# Patient Record
Sex: Female | Born: 1935
Health system: Southern US, Community
[De-identification: ages and names within clinical notes are randomized; demographics above are authoritative.]

## PROBLEM LIST (undated history)

## (undated) DIAGNOSIS — Z974 Presence of external hearing-aid: Secondary | ICD-10-CM

## (undated) DIAGNOSIS — I10 Essential (primary) hypertension: Secondary | ICD-10-CM

## (undated) HISTORY — PX: TONSILLECTOMY AND ADENOIDECTOMY: SUR1326

## (undated) HISTORY — PX: KNEE SURGERY: SHX244

## (undated) HISTORY — PX: PARTIAL HYSTERECTOMY: SHX80

## (undated) HISTORY — PX: LUNG BIOPSY: SHX232

## (undated) HISTORY — DX: Essential (primary) hypertension: I10

---

## 1999-10-21 ENCOUNTER — Other Ambulatory Visit: Admission: RE | Admit: 1999-10-21 | Discharge: 1999-10-21 | Payer: Self-pay | Admitting: Obstetrics and Gynecology

## 1999-10-28 ENCOUNTER — Encounter: Payer: Self-pay | Admitting: Obstetrics and Gynecology

## 1999-10-28 ENCOUNTER — Encounter: Admission: RE | Admit: 1999-10-28 | Discharge: 1999-10-28 | Payer: Self-pay | Admitting: Obstetrics and Gynecology

## 1999-12-13 ENCOUNTER — Encounter: Payer: Self-pay | Admitting: Obstetrics and Gynecology

## 1999-12-13 ENCOUNTER — Encounter: Admission: RE | Admit: 1999-12-13 | Discharge: 1999-12-13 | Payer: Self-pay | Admitting: Internal Medicine

## 2000-04-12 ENCOUNTER — Other Ambulatory Visit: Admission: RE | Admit: 2000-04-12 | Discharge: 2000-04-12 | Payer: Self-pay | Admitting: Obstetrics and Gynecology

## 2000-10-06 ENCOUNTER — Encounter: Payer: Self-pay | Admitting: Emergency Medicine

## 2000-10-06 ENCOUNTER — Emergency Department (HOSPITAL_COMMUNITY): Admission: EM | Admit: 2000-10-06 | Discharge: 2000-10-06 | Payer: Self-pay | Admitting: Emergency Medicine

## 2001-03-22 ENCOUNTER — Other Ambulatory Visit: Admission: RE | Admit: 2001-03-22 | Discharge: 2001-03-22 | Payer: Self-pay | Admitting: Obstetrics and Gynecology

## 2002-07-16 ENCOUNTER — Encounter: Payer: Self-pay | Admitting: Obstetrics and Gynecology

## 2002-07-16 ENCOUNTER — Encounter: Admission: RE | Admit: 2002-07-16 | Discharge: 2002-07-16 | Payer: Self-pay | Admitting: Obstetrics and Gynecology

## 2003-10-07 ENCOUNTER — Encounter: Admission: RE | Admit: 2003-10-07 | Discharge: 2003-10-07 | Payer: Self-pay | Admitting: Family Medicine

## 2003-10-30 ENCOUNTER — Ambulatory Visit (HOSPITAL_COMMUNITY): Admission: RE | Admit: 2003-10-30 | Discharge: 2003-10-30 | Payer: Self-pay | Admitting: Family Medicine

## 2003-11-20 ENCOUNTER — Encounter: Admission: RE | Admit: 2003-11-20 | Discharge: 2003-11-20 | Payer: Self-pay | Admitting: Orthopedic Surgery

## 2003-12-21 ENCOUNTER — Encounter: Admission: RE | Admit: 2003-12-21 | Discharge: 2003-12-21 | Payer: Self-pay | Admitting: Orthopedic Surgery

## 2003-12-23 ENCOUNTER — Ambulatory Visit (HOSPITAL_BASED_OUTPATIENT_CLINIC_OR_DEPARTMENT_OTHER): Admission: RE | Admit: 2003-12-23 | Discharge: 2003-12-23 | Payer: Self-pay | Admitting: Orthopedic Surgery

## 2003-12-23 ENCOUNTER — Ambulatory Visit (HOSPITAL_COMMUNITY): Admission: RE | Admit: 2003-12-23 | Discharge: 2003-12-23 | Payer: Self-pay | Admitting: Orthopedic Surgery

## 2005-07-11 ENCOUNTER — Encounter: Admission: RE | Admit: 2005-07-11 | Discharge: 2005-07-11 | Payer: Self-pay | Admitting: Family Medicine

## 2005-08-18 ENCOUNTER — Encounter: Admission: RE | Admit: 2005-08-18 | Discharge: 2005-08-18 | Payer: Self-pay | Admitting: Family Medicine

## 2006-09-03 ENCOUNTER — Encounter (INDEPENDENT_AMBULATORY_CARE_PROVIDER_SITE_OTHER): Payer: Self-pay | Admitting: Cardiology

## 2006-09-03 ENCOUNTER — Ambulatory Visit: Payer: Self-pay | Admitting: Vascular Surgery

## 2006-09-03 ENCOUNTER — Inpatient Hospital Stay (HOSPITAL_COMMUNITY): Admission: EM | Admit: 2006-09-03 | Discharge: 2006-09-04 | Payer: Self-pay | Admitting: Emergency Medicine

## 2006-09-14 ENCOUNTER — Ambulatory Visit (HOSPITAL_COMMUNITY): Admission: RE | Admit: 2006-09-14 | Discharge: 2006-09-14 | Payer: Self-pay | Admitting: Ophthalmology

## 2006-09-19 ENCOUNTER — Inpatient Hospital Stay (HOSPITAL_COMMUNITY): Admission: EM | Admit: 2006-09-19 | Discharge: 2006-09-21 | Payer: Self-pay | Admitting: Emergency Medicine

## 2007-10-22 ENCOUNTER — Encounter: Admission: RE | Admit: 2007-10-22 | Discharge: 2007-10-22 | Payer: Self-pay | Admitting: Family Medicine

## 2009-04-14 ENCOUNTER — Ambulatory Visit (HOSPITAL_COMMUNITY): Admission: RE | Admit: 2009-04-14 | Discharge: 2009-04-14 | Payer: Self-pay | Admitting: Orthopedic Surgery

## 2009-04-14 ENCOUNTER — Encounter (INDEPENDENT_AMBULATORY_CARE_PROVIDER_SITE_OTHER): Payer: Self-pay | Admitting: Orthopedic Surgery

## 2009-04-14 ENCOUNTER — Ambulatory Visit: Payer: Self-pay | Admitting: Vascular Surgery

## 2010-09-20 NOTE — Consult Note (Signed)
NAME:  Danielle Thomas, Danielle Thomas NO.:  [id number]   MEDICAL RECORD NO.:  [id number]          PATIENT TYPE:  INP   LOCATION:  4715                         FACILITY:  [facility]   PHYSICIAN:  Lyn Records, M.D.   DATE OF BIRTH:  Dec 16, 1927   DATE OF CONSULTATION:  09/20/2006  DATE OF DISCHARGE:                                 CONSULTATION   REASON FOR CONSULTATION:  Paroxysmal atrial fibrillation, recent stroke.   CONCLUSIONS:  1. Tachybrady syndrome, asymptomatic.      a.     Paroxysmal atrial fibrillation.      b.     Post-conversion bradycardia.      c.     Sinus bradycardia at rest.      d.     Sinus bradycardia with first-degree AV block on 12-lead EKG.  2. 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  3. Hypertension.   RECOMMENDATIONS:  1. There is no indication for permanent pacemaker therapy at this      time.  2. Because the tachybrady syndrome is asymptomatic and during atrial      fibrillation the rate is controlled, we do not need to pursue      rhythm control.  She will however need to have continuous      ambulatory monitoring for 7 days after discharge to determine if      when atrial fibrillation is present rate is excessively fast.  3. Coumadin should be taken indefinitely.  4. Blood pressure should be controlled with medications and also heart      rate.  5. We should overlap with Lovenox until INR is greater than 2.   COMMENTS:  The patient is a very pleasant 77 year old who was recently  discharged from the hospital after a transient neurological deficit that  was associated with broken aphasia.  Yesterday she noted elevation in  her blood pressure although she felt well.  She went into Dr. Aurelio Brash  office and it was noted that her heart rate was irregular.  EKG done at  that time demonstrated atrial fibrillation, new in onset and never  previously identified.   She was admitted to the hospital and had spontaneous conversion to  normal sinus rhythm associated with a 2-second pause.  Since that time  she has had sinus bradycardia to low normal sinus rhythm in the 50-70  range.  She is totally asymptomatic.  She has not had any new  neurological complaints.   Had a previous Cardiolite study done in 2003 that was normal.  She has  also had a recent echocardiogram done during her hospitalization for  transient ischemic attack that revealed an LVEF of 70% with no regional  wall motion abnormalities and mild mitral regurgitation.   The patient's medications on admission included lisinopril 10 mg per day  and aspirin 325 mg per day.   FAMILY HISTORY:  Noncontributory.   SIGNIFICANT MEDICAL PROBLEMS:  1. Hypertension.  2. Transient neurological deficit as mentioned above.  3. Glaucoma recently operated upon.  4. Renal insufficiency.  5. History of hypokalemia.  ALLERGIES:  CODEINE AND DEMEROL.   EXAMINATION:  GENERAL:  The patient was lying comfortably.  VITAL SIGNS:  Her blood pressure is 120/68, heart rate 48, respirations  18, O2 saturation 96% on room air.  NECK:  Exam reveals faint bilateral carotid bruits.  LUNGS:  Clear.  CARDIAC EXAM:  No murmur, no rub.  ABDOMEN:  Soft.  EXTREMITIES:  No edema.  NEUROLOGIC:  Exam reveals no focal deficit.   STUDIES:  EKG reveals sinus bradycardia with first-degree AV block at  2:20 a.m. on Sep 20, 2006. PR intervals 244 msec, there is a left atrial  abnormality, heart rate is 52.  Prior EKG on Sep 19, 2006, from Dr.  Aurelio Brash office revealed atrial fibrillation with a rate of 86 beats  per minute.  EKG repeated on Sep 19, 2006, at 2:08 p.m. heart rate was  90 with the patient in atrial fibrillation.   Laboratory data includes creatinine of 1.1, hemoglobin 14.4, white blood  cell count 6800,  CK-MB 5.3, BNP 586, TSH 1.79.  Chest x-ray has not  been repeated.  Blood pressures have been under good control since  admission.   DISCUSSION:  The patient has  tachybrady syndrome with paroxysmal atrial  fibrillation, postconversion bradycardia and controlled ventricular  response in atrial fibrillation.  She is asymptomatic with reference to  the tachybrady syndrome.  Because of rate control during atrial  fibrillation, rhythm control is not mandatory.  Rhythm control will  probably precipitate permanent pacemaker insertion.  I believe at this  point the only mandatory therapy is good blood pressure control with non-  heart-rate-slowing medications and chronic Coumadin therapy to prevent  recurrent embolic events.  Her CHADS2 score is 4 suggesting an embolic  yearly risk of greater than 8% per year.  She should have continuous  ambulatory monitoring for at least a week as an outpatient and if we  demonstrate excessive bradycardia and certainly if there any symptoms to  suggest possible transient cerebral hypoperfusion permanent pacemaker  may become necessary.      Lyn Records, M.D.  Electronically Signed     HWS/MEDQ  D:  09/20/2006  T:  09/20/2006  Job:  [id number]   cc:   Vikki Ports, M.D.

## 2010-09-20 NOTE — Discharge Summary (Signed)
NAME:  Danielle Thomas, Danielle Thomas                ACCOUNT NO.:  [id number]   MEDICAL RECORD NO.:  [id number]          PATIENT TYPE:  INP   LOCATION:  4715                         FACILITY:  [facility]   PHYSICIAN:  Corinna L. Lendell Caprice, MDDATE OF BIRTH:  12-03-1927   DATE OF ADMISSION:  09/19/2006  DATE OF DISCHARGE:  09/21/2006                               DISCHARGE SUMMARY   DISCHARGE DIAGNOSES:  1. Atrial fibrillation with tachybrady syndrome, new diagnosis,      paroxysmal.  2. Uncontrolled hypertension.  3. History of recent transient ischemic attack.   DISCHARGE MEDICATIONS:  1. Coumadin 5 mg nightly.  2. Lisinopril 10 mg daily.  3. She is to stop aspirin.   CONDITION:  Stable.   CONSULTATION:  Dr. Verdis Prime.   PROCEDURES:  None.   DIET:  Low-salt, Coumadin friendly.   ACTIVITY:  Ad lib.   FOLLOW UP:  1. Dr. Theresia Lo as needed.  2. Dr. Verdis Prime as scheduled by him in a week.  3. [facility] on Monday.   PERTINENT LABORATORY DATA:  CBC within normal limits.  PTT on admission  33, INR on admission is 1, at discharge is 1.7.  Basic metabolic panel  unremarkable.  Cardiac enzymes unremarkable.  BNP 586.  TSH 1.795.   SPECIAL STUDIES AND RADIOLOGY:  EKG showed atrial fibrillation with a  rate of 90.   HISTORY AND HOSPITAL COURSE:  Ms. Buccieri is a 77 year old white female  patient of Dr. Theresia Lo who was noted to have a blood pressure above 200  and irregular heart rhythm in the office.  An EKG was done in the office  which showed atrial fibrillation and she was sent to the emergency room.  Her blood pressure in the emergency room was 177/103 and she remained in  atrial fibrillation with a rate of about 100.  She was asymptomatic with  respect to the arrhythmia.  She had suffered a recent TIA and was not  noted to be in atrial fibrillation at that time.  She was placed on  telemetry, started on Lovenox and Coumadin.  At discharge her INR is  nearly therapeutic.  Her  lisinopril was continued.  She had a dose of IV  Cardizem in the emergency room as her blood pressure was high and her  rate was slightly high.  She also got a single dose of Cardizem CD, but  subsequently converted to normal sinus rhythm with periods of sinus  bradycardia and pauses. The Cardizem was stopped and cardiology was  consulted.  They recommended lifelong Coumadin.  As she was asymptomatic  with respect to her bradycardia, they did not feel that a pacemaker was  warranted at this time.  They also recommended no antiarrhythmics at  this time.  She will be set up for Holter monitor as an outpatient.  She  was given teaching about a Coumadin friendly diet and spoke with the  dietician prior to discharge.  Other than irregularly irregular heart  rhythm, she had an unremarkable exam on admission.  At discharge her  blood pressure was within normal limits on  the lisinopril alone.  She  had some periods of sinus bradycardia when she was asleep but was  ambulating fine and otherwise asymptomatic.      Corinna L. Lendell Caprice, MD  Electronically Signed     CLS/MEDQ  D:  09/21/2006  T:  09/21/2006  Job:  [id number]   cc:   Lyn Records, M.D.  Vikki Ports, M.D.

## 2010-09-20 NOTE — H&P (Signed)
NAME:  Danielle Thomas, Danielle Thomas NO.:  [id number]   MEDICAL RECORD NO.:  [id number]          PATIENT TYPE:  EMS   LOCATION:  MAJO                         FACILITY:  [facility]   PHYSICIAN:  Hollice Espy, M.D.DATE OF BIRTH:  [birth year]   DATE OF ADMISSION:  09/02/2006  DATE OF DISCHARGE:                              HISTORY & PHYSICAL   PCP:  Dr. Lanell Persons.   CHIEF COMPLAINT:  Slurred speech.   HISTORY OF PRESENT ILLNESS:  The patient is a 77 year old white female  with past medical history of hypertension who presents to the emergency  room with complaints of slurred speech.  She previously has been in good  health and then this evening while having dessert suddenly noticed that  she was drooling.  This would not stop and she looked in the mirror and  she found one-half of her face to be drooping.  She called her daughter  immediately and her daughter noted that the patient had slurred speech  on the phone.  Paramedics were called and by the time the paramedics  arrived, all of her symptoms had completely resolved.  She said the  entire spell lasted for less than 30 minutes.  She was brought into the  emergency room.  There, she was noted to have a blood pressure of 167/93  and a heart rate of 57 and normal sinus.  Labs were ordered on the  patient, all of which were essentially unremarkable, except for a  creatinine of 1.2.  A CT scan of the head was unremarkable as well.  It  was likely felt that the patient had suffered a TIA and it was thought  she best come in for further evaluation.  Currently the patient is doing  well.   REVIEW OF SYSTEMS:  She denies any headaches, vision changes, dysphagia,  chest pain, palpitations, shortness of breath, wheezing, coughing,  abdominal pain, hematuria, dysuria, constipation, diarrhea, focal  extremity numbness, weakness, or pain, nausea, vomiting, or previous  complaints.  Her review of systems is otherwise negative.   PAST MEDICAL HISTORY:  Includes hypertension.   MEDICATIONS:  She is on:  1. Atenolol and hydrochlorothiazide 100/25 mg 1/2 tab p.o. daily.  2. Lisinopril 10 mg p.o. daily.   ALLERGIES:  She has no known drug allergies.   SOCIAL HISTORY:  She denies any tobacco, alcohol, or drug use.   FAMILY HISTORY:  Noncontributory.   PHYSICAL EXAMINATION:  VITAL SIGNS:  On admission, temp 97.4, heart rate  57, blood pressure 167/93, respirations 13.  O2 sat 97% on room air.  GENERAL:  The patient is alert, oriented x3, in no apparent distress.  HEENT:  Normocephalic, atraumatic.  NECK:  She has no carotid bruits.  HEART:  Regular rhythm, mild bradycardia.  LUNGS:  Clear to auscultation bilaterally.  ABDOMEN:  Soft, nontender, nondistended.  Positive bowel sounds.  EXTREMITIES:  Show no clubbing, cyanosis, or edema.  NEUROLOGIC:  Her cranial nerves, II through XII, are intact.  Completely  normal.  There are no focal deficits.  She has no cerebellar  dysfunction.  She  has good coordination.  Her strength is equal and  symmetric on both sides, about a 5/5, in both flexion and extension and  grip for upper and lower extremities.  Full sensation is intact.  She  has no sensory dysfunction.  No evidence of Babinski sign.   LAB WORK:  CT scan of the head shows no acute intracranial  abnormalities.  Atrophy and chronic small-vessel disease.  White count  6.6, H&H 13.9 and 40.6, MCV of 87, platelets 200, no shift.  Sodium 141,  potassium 3.2, chloride 106, bicarb 26, BUN 22, creatinine 1.2, glucose  of 123.  LFTs are unremarkable.  Coags are unremarkable.   ASSESSMENT AND PLAN:  1. Transient ischemic attack.  We will plan to place the patient on      telemetry, monitor her blood pressure.  Check an MRI/MRA to rule      out intracranial stenosis as well as extracranial stenosis.      Carotid artery Dopplers.  I will also check a 2-D echo, fasting      lipid profile, and homocysteine level.  2.  Hypertension.  Her systolic blood pressure is not very well      controlled.  We will continue on her lisinopril, atenolol.  She      will likely need an increase in her ACE inhibitor or to add an      additional agent such as perhaps nitrate.  3. Bradycardia, mild, secondary to her continued use of atenolol.  4. Renal insufficiency secondary to hypertension.  5. Hypokalemia.  We will replace.      Hollice Espy, M.D.  Electronically Signed     SKK/MEDQ  D:  09/02/2006  T:  09/03/2006  Job:  [id number]   cc:   Vikki Ports, M.D.

## 2010-09-20 NOTE — H&P (Signed)
NAME:  Danielle Thomas, Danielle Thomas                ACCOUNT NO.:  [id number]   MEDICAL RECORD NO.:  [id number]          PATIENT TYPE:  INP   LOCATION:  4715                         FACILITY:  [facility]   PHYSICIAN:  Danielle Thomas, MDDATE OF BIRTH:  08-17-1927   DATE OF ADMISSION:  09/19/2006  DATE OF DISCHARGE:                              HISTORY & PHYSICAL   CHIEF COMPLAINT:  High blood pressure and irregular heart beat.   HISTORY OF PRESENT ILLNESS:  Danielle Thomas is a pleasant 77 year old white  female patient of Danielle Thomas who was sent to the emergency room for  evaluation of new onset atrial fibrillation as well as uncontrolled  hypertension.  The patient took her blood pressure this morning.  Her  systolic blood pressure was above 200 and she therefore sought treatment  at [facility].  He did an EKG and noted that she was in  atrial fibrillation and she was therefore sent to the emergency room.  She has had no chest pain.  She has had no shortness of breath.  She has  had no history of heart disease.  She does have a history of  hypertension.  She had an echocardiogram last month at which time she  was admitted for a transient ischemic attack.  Her ejection fraction was  normal on echocardiogram, and she had no significant valvular disease or  source of embolus.  She did, however, have biatrial dilatation, which  was described as moderate.  The patient is asymptomatic with respect to  her atrial fibrillation.  However, she does say that sometimes at  nighttime when she is getting ready for bed she can feel her heart  racing.  Currently, she feels no palpitations.  The patient reports  having had a stress test with [facility]le Thomas about 5 years ago, which she  thinks was unremarkable.  Her beta blocker, hydrochlorothiazide  combination pill was recently stopped due to some diarrhea and dizziness  and possibly issues with low blood pressure.  The patient is not having  any diarrhea  currently.  She is still on her lisinopril.  There is no  discharge summary from her hospitalization last month.   PAST MEDICAL HISTORY:  1. Reported TIA last month, although discharge summary is not      available.  2. Hypertension.  3. Recent laser surgery for increased pressure in her eyes, although      she denies glaucoma.  4. She apparently had some mild bradycardia, renal insufficiency, and      hypokalemia during her last admission.  Her heart rate apparently      at that time was 57.   MEDICATIONS:  1. Lisinopril 10 mg a day.  2. Aspirin 325 mg a day.   SOCIAL HISTORY:  Reviewed and as per previous H&P.   FAMILY HISTORY:  Noncontributory.   REVIEW OF SYSTEMS:  As above, otherwise negative.   PHYSICAL EXAMINATION:  VITAL SIGNS:  Her blood pressure in the emergency  room was 177/103, temperature 97.3, pulse ranged from 95-103,  respiratory rate was 24, oxygen saturation 99% on room  air.  GENERAL:  The patient is a somewhat anxious-appearing white female who  is cooperative.  HEENT:  Normocephalic, atraumatic.  Pupils equal, round, reactive to  light.  Sclerae nonicteric.  Moist mucous membranes.  NECK:  Supple.  No carotid bruits.  No JVD.  LUNGS:  Clear to auscultation bilaterally without wheezes, rhonchi, or  rales.  CARDIOVASCULAR:  Irregularly irregular without murmurs, gallops, or  rubs.  ABDOMEN:  Normal bowel sounds, soft, nontender, nondistended.  GU AND RECTAL:  Deferred.  EXTREMITIES:  No clubbing, cyanosis or edema.  SKIN:  No rash.  PSYCHIATRIC:  As above.   LABS:  Coagulation panel normal.  Basic metabolic panel is essentially  unremarkable.  Cardiac enzymes unremarkable.  CBC was not done.  EKG  shows atrial fibrillation with rate of 94.   ASSESSMENT AND PLAN:  1. Atrial fibrillation, new onset:  The patient is relatively      asymptomatic and therefore the duration of the atrial fibrillation      is unknown.  I will therefore start anticoagulation  and let Dr.      Katrinka Thomas know the patient has been admitted.  He may just decide to      follow up the patient in his Thomas in 30 days on Coumadin as long      as she remains otherwise stable.  Certainly, this could have caused      her transient ischemic attack.  If she has had paroxysmal atrial      fibrillation that has previously been undocumented.  Her rate is      slightly high and I will therefore give Cardizem.  She got a dose      of intravenous Cardizem in the emergency room.  2. Uncontrolled hypertension most likely secondary to stopping her      beta blocker and hydrochlorothiazide.  For now, I will just give      Cardizem but continue the lisinopril.  3. Recent transient ischemic attack.  I will stop the aspirin while      she is on Coumadin.  4. Recent laser surgery in both eyes.      Danielle L. Lendell Caprice, MD  Electronically Signed     CLS/MEDQ  D:  09/19/2006  T:  09/20/2006  Job:  [id number]   cc:   Danielle Thomas, M.D.  Danielle Thomas, M.D.

## 2010-09-23 NOTE — Op Note (Signed)
NAME:  Danielle Thomas, Danielle Thomas                          ACCOUNT NO.:  [id number]   MEDICAL RECORD NO.:  [id number]                   PATIENT TYPE:  AMB   LOCATION:  DSC                                  FACILITY:  [facility]   PHYSICIAN:  Harvie Junior, M.D.                DATE OF BIRTH:  29-Jan-1928   DATE OF PROCEDURE:  12/23/2003  DATE OF DISCHARGE:                                 OPERATIVE REPORT   REFERRING PHYSICIAN:  Vikki Ports, M.D.   PREOPERATIVE DIAGNOSIS:  Medial meniscal tear with osteochondral defect  medial femoral condyle.   POSTOPERATIVE DIAGNOSES:  1. Medial meniscal tear with osteochondral defect medial femoral condyle.  2. Chondromalacia patella.   PROCEDURES:  1. Debridement of posterior horn medial meniscal tear with corresponding     debridement of medial femoral condyle and medial tibial plateau.  2. Debridement of chondromalacia patella.   SURGEON:  Harvie Junior, M.D.   ASSISTANT:  Marshia Ly, P.A.   ANESTHESIA:  General anesthesia.   INDICATIONS FOR PROCEDURE:  She is a 77 year old female with a long history  of having medial side joint pain.  We ultimately evaluated her in the office  and treated with anti-inflammatory medication, activity modification and  injection therapy and one of these worked.  She continued to complain of  pain.  MRI was obtained which showed she had a medial meniscal tear as well  as a chondral defect on the medial femoral condyle.  We ultimately talked  about treatment options and felt that arthroscopy will be appropriate course  of action and brought to the operating room for this procedure.   DESCRIPTION OF PROCEDURE:  The patient brought to the operating room and  after adequate anesthesia was obtained with general anesthetic with a knee  block.  The patient was placed supine on the operating table.  the right leg  weakness prepped and draped in sterile fashion.  Following this __________  examination of the knee  revealed that there was an obvious medial meniscal  tear in the posterior horn.  This was debrided back to a smooth and stable  rim.  Attention was then turned to the medial femoral condyle which had some  fairly significant chondral defects with flaking of the articular cartilage.  There was one small area of full thickness defect.  All of this was debrided  back to a smooth and stable rim.  Attention was turned to the lateral  compartment where there was noted to be no significant meniscal pathology or  chondral injury.  ACL was normal.  Patellofemoral joint showed some grade  III changes.  This was debrided on the undersurface of the patella.  She had  a large medial shell plica which was debrided going down to the anterior  horn of the medial meniscus.  The medial meniscus was a little bit mobile on  the right but felt to  overall be in reasonable shape.  This was not  addressed surgically.  At this point, final check was made of the medial  side to make sure that the meniscus posteriorly was held in good position  and following that, attention was turned to the medial femoral condyle to  make sure that this had been debrided adequately.  At this point, the knee  was copiously irrigated and suctioned dry.  The arthroscopic portal was  closed with a bandage.  Sterile compressive dressing was applied.  The  patient was taken to the recovery room where she was noted to be in  satisfactory condition.   ESTIMATED BLOOD LOSS:  None.                                               Harvie Junior, M.D.    Ranae Plumber  D:  12/23/2003  T:  12/23/2003  Job:  [id number]   cc:   Vikki Ports, M.D.  [address] [place] [place]  [place]  [place] [address]  Fax: [phone]

## 2013-02-10 ENCOUNTER — Other Ambulatory Visit: Payer: Self-pay | Admitting: Pharmacist

## 2013-02-10 MED ORDER — WARFARIN SODIUM 3 MG PO TABS: ORAL_TABLET | ORAL | Status: DC

## 2013-02-13 ENCOUNTER — Ambulatory Visit (INDEPENDENT_AMBULATORY_CARE_PROVIDER_SITE_OTHER): Payer: Medicare Other | Admitting: Pharmacist

## 2013-02-13 DIAGNOSIS — I4891 Unspecified atrial fibrillation: Secondary | ICD-10-CM

## 2013-02-13 LAB — POCT INR: INR: 2.9

## 2013-03-27 ENCOUNTER — Ambulatory Visit (INDEPENDENT_AMBULATORY_CARE_PROVIDER_SITE_OTHER): Payer: Medicare Other | Admitting: Pharmacist

## 2013-03-27 DIAGNOSIS — I4891 Unspecified atrial fibrillation: Secondary | ICD-10-CM

## 2013-03-27 LAB — POCT INR: INR: 3

## 2013-04-08 ENCOUNTER — Encounter: Payer: Self-pay | Admitting: Interventional Cardiology

## 2013-04-08 ENCOUNTER — Ambulatory Visit (INDEPENDENT_AMBULATORY_CARE_PROVIDER_SITE_OTHER): Payer: Medicare Other | Admitting: Interventional Cardiology

## 2013-04-08 VITALS — BP 144/82 | HR 58 | Ht 60.0 in | Wt 145.0 lb

## 2013-04-08 DIAGNOSIS — I10 Essential (primary) hypertension: Secondary | ICD-10-CM | POA: Insufficient documentation

## 2013-04-08 DIAGNOSIS — E785 Hyperlipidemia, unspecified: Secondary | ICD-10-CM | POA: Insufficient documentation

## 2013-04-08 MED ORDER — HYDROCHLOROTHIAZIDE 25 MG PO TABS: 12.5000 mg | ORAL_TABLET | Freq: Every day | ORAL | Status: DC

## 2013-04-08 MED ORDER — LOSARTAN POTASSIUM 50 MG PO TABS: 50.0000 mg | ORAL_TABLET | Freq: Every day | ORAL | Status: DC

## 2013-04-08 MED ORDER — AMLODIPINE BESYLATE 5 MG PO TABS: 5.0000 mg | ORAL_TABLET | Freq: Every day | ORAL | Status: DC

## 2013-04-08 NOTE — Patient Instructions (Signed)
Your physician recommends that you continue on your current medications as directed. Please refer to the Current Medication list given to you today.  Your physician wants you to follow-up in: 1 year. You will receive a reminder letter in the mail two months in advance. If you don't receive a letter, please call our office to schedule the follow-up appointment.  

## 2013-04-08 NOTE — Progress Notes (Signed)
Patient ID: Danielle Thomas, female   DOB: Oct 27, 1927, 79 y.o.   MRN: [id number] Past Medical History  Paroxysmal atrial fibrillation   Hypertension   Hyperlipidemia      [address] [place], [place]  [address] Phone: [phone] Fax:  [phone]  Date:  04/08/2013   ID:  Danielle Thomas, Danielle Thomas 1928-01-09, MRN [id number]  PCP:  No primary provider on file.   ASSESSMENT:  1. Paroxysmal atrial fibrillation, asymptomatic 2. Hypertension 3. Hyperlipidemia  PLAN:  1. No change in current medical regimen 2. Clinical followup in one year.   SUBJECTIVE: Danielle Thomas is a 79 y.o. female who is very frustrated today as she was overlooked in the waiting room where she remained for greater than an hour. She is also frustrated by the difficulty with finding parking. She has no cardiac complaints.   Wt Readings from Last 3 Encounters:  04/08/13 145 lb (65.772 kg)     History reviewed. No pertinent past medical history.  Current Outpatient Prescriptions  Medication Sig Dispense Refill  . amLODipine (NORVASC) 5 MG tablet Take 1 tablet (5 mg total) by mouth daily.  90 tablet  3  . dorzolamide (TRUSOPT) 2 % ophthalmic solution 1 drop 3 (three) times daily.      . hydrochlorothiazide (HYDRODIURIL) 25 MG tablet Take 0.5 tablets (12.5 mg total) by mouth daily.  45 tablet  3  . losartan (COZAAR) 50 MG tablet Take 1 tablet (50 mg total) by mouth daily.  90 tablet  3  . omeprazole (PRILOSEC) 20 MG capsule Take 20 mg by mouth daily.      Marland Kitchen oxybutynin (DITROPAN) 5 MG tablet Take 5 mg by mouth 3 (three) times daily.      . travoprost, benzalkonium, (TRAVATAN) 0.004 % ophthalmic solution 1 drop at bedtime.      Marland Kitchen warfarin (COUMADIN) 3 MG tablet Take as directed by coumadin clinic  70 tablet  1   No current facility-administered medications for this visit.    Allergies:   Allergies not on file  Social History:  The patient  reports that she has never smoked. She does not have  any smokeless tobacco history on file.   ROS:  Please see the history of present illness.   Unremarkable with no bleeding or transient neurological symptoms.   All other systems reviewed and negative.   OBJECTIVE: VS:  BP 144/82  Pulse 58  Ht 5' (1.524 m)  Wt 145 lb (65.772 kg)  BMI 28.32 kg/m2 Well nourished, well developed, in no acute distress, healthy HEENT: normal Neck: JVD flat. Carotid bruit absent  Cardiac:  normal S1, S2; RRR; no murmur Lungs:  clear to auscultation bilaterally, no wheezing, rhonchi or rales Abd: soft, nontender, no hepatomegaly Ext: Edema absent. Pulses 2+ Skin: warm and dry Neuro:  CNs 2-12 intact, no focal abnormalities noted  EKG:  Not performed       Signed, Darci Needle III, MD 04/08/2013 2:10 PM

## 2013-04-16 ENCOUNTER — Telehealth: Payer: Self-pay | Admitting: Interventional Cardiology

## 2013-04-16 MED ORDER — LISINOPRIL 20 MG PO TABS: 30.0000 mg | ORAL_TABLET | Freq: Every day | ORAL | Status: DC

## 2013-04-16 NOTE — Telephone Encounter (Signed)
returned pt call pt is not to take losartan.lisinopril 20mg  1.5 tablet has been called into pt mail order and verified doasge. pt instructed to disgard losartan. verified pt was not billed for losartan. and lisinopril will be sent to pt.pt informed and verbalized understanding

## 2013-04-16 NOTE — Telephone Encounter (Signed)
New problem    Receive new medication - lostarn 50 mg  in the mail .patient is question why she receive this ?

## 2013-05-09 ENCOUNTER — Ambulatory Visit (INDEPENDENT_AMBULATORY_CARE_PROVIDER_SITE_OTHER): Payer: Medicare HMO | Admitting: Pharmacist

## 2013-05-09 DIAGNOSIS — I4891 Unspecified atrial fibrillation: Secondary | ICD-10-CM

## 2013-05-09 LAB — POCT INR: INR: 2.7

## 2013-07-02 ENCOUNTER — Other Ambulatory Visit: Payer: Self-pay

## 2013-07-02 MED ORDER — HYDROCHLOROTHIAZIDE 25 MG PO TABS: 12.5000 mg | ORAL_TABLET | Freq: Every day | ORAL | Status: DC

## 2013-09-15 ENCOUNTER — Telehealth: Payer: Self-pay | Admitting: *Deleted

## 2013-09-15 ENCOUNTER — Other Ambulatory Visit: Payer: Self-pay | Admitting: *Deleted

## 2013-09-15 MED ORDER — LISINOPRIL 20 MG PO TABS: 30.0000 mg | ORAL_TABLET | Freq: Every day | ORAL | Status: DC

## 2013-09-15 MED ORDER — AMLODIPINE BESYLATE 5 MG PO TABS: 5.0000 mg | ORAL_TABLET | Freq: Every day | ORAL | Status: DC

## 2013-09-15 NOTE — Telephone Encounter (Signed)
Informed patient Danielle Thomas will be Managing her coumadin since they checked  INR 5/4 and dosed her coumadin, they are sending a warfarin refill to [facility].

## 2013-09-15 NOTE — Telephone Encounter (Signed)
Patient requests warfarin refill be sent to [facility]. Thanks, MI 

## 2013-09-15 NOTE — Telephone Encounter (Signed)
Per [facility]/Dr McNeils office, May 4th patients INR checked 2.1, patient was instructed to continue same dose of coumadin 3 mg, 1/2 tablet daily except on Tues take 1 tablet, informed them patient has requested a refill on her warfarin to [facility].

## 2014-01-29 ENCOUNTER — Other Ambulatory Visit: Payer: Self-pay | Admitting: *Deleted

## 2014-01-29 MED ORDER — AMLODIPINE BESYLATE 5 MG PO TABS: 5.0000 mg | ORAL_TABLET | Freq: Every day | ORAL | Status: DC

## 2014-01-29 MED ORDER — LISINOPRIL 20 MG PO TABS: 30.0000 mg | ORAL_TABLET | Freq: Every day | ORAL | Status: DC

## 2014-02-13 ENCOUNTER — Ambulatory Visit: Payer: Self-pay | Admitting: Interventional Cardiology

## 2014-02-13 DIAGNOSIS — I4891 Unspecified atrial fibrillation: Secondary | ICD-10-CM

## 2014-04-09 DIAGNOSIS — Z7901 Long term (current) use of anticoagulants: Secondary | ICD-10-CM | POA: Insufficient documentation

## 2014-04-10 ENCOUNTER — Ambulatory Visit (INDEPENDENT_AMBULATORY_CARE_PROVIDER_SITE_OTHER): Payer: Medicare HMO | Admitting: Interventional Cardiology

## 2014-04-10 ENCOUNTER — Encounter: Payer: Self-pay | Admitting: Interventional Cardiology

## 2014-04-10 VITALS — BP 114/60 | HR 82 | Ht 60.0 in | Wt 168.0 lb

## 2014-04-10 DIAGNOSIS — E785 Hyperlipidemia, unspecified: Secondary | ICD-10-CM

## 2014-04-10 DIAGNOSIS — E860 Dehydration: Secondary | ICD-10-CM

## 2014-04-10 DIAGNOSIS — Z7901 Long term (current) use of anticoagulants: Secondary | ICD-10-CM

## 2014-04-10 DIAGNOSIS — I10 Essential (primary) hypertension: Secondary | ICD-10-CM

## 2014-04-10 DIAGNOSIS — I480 Paroxysmal atrial fibrillation: Secondary | ICD-10-CM

## 2014-04-10 NOTE — Progress Notes (Signed)
Patient ID: Danielle Thomas, female   DOB: 05/23/1927, 80 y.o.   MRN: [id number]    [address], [place]  [address] Phone: [phone] Fax:  [phone]  Date:  04/10/2014   ID:  Blue, Ruggerio Apr 01, 1928, MRN [id number]  PCP:  No primary care provider on file.   ASSESSMENT:  1. Atrial fibrillation with controlled ventricular response 2. Systemic arterial hypertension, essential and controlled. There is orthostasis today with 132 mmHg systolic pressure sitting and dropping to 90 mmHg was standing. 3. New problem: Nausea, diarrhea, and orthostatic hypotension, confirming the clinical suspicion of dehydration 4. Chronic anticoagulation therapy without complications  PLAN:  1. Discontinue temporarily lisinopril and hydrochlorothiazide 2. Follow-up with primary care concerning laboratory data obtained on the last office visit that included an amylase, lipase, and comprehensive metabolic panel. She seems to be volume contracted at this time 3. No changes in other cardiovascular therapy. 4. INR needs to be followed closely as the patient's intake has significantly been altered and I would anticipate that the INR will have a tendency to be high. It was greater than 4 yesterday and her warfarin dose was cut in half.    SUBJECTIVE: Danielle Thomas is a 80 y.o. female who is here today for follow-up of chronic atrial fibrillation. She has no complaints with reference to that problem. She has a 1 week history of abdominal bloating, nausea, diarrhea, and over the past 24 hours orthostatic dizziness. She saw the 21 assistant at [facility] at for college and was told she had a urinary tract infection. Nitrofurantoin and Compazine were prescribed. Blood work was performed. INR was greater than 4. Coumadin dose was adjusted downward. No other changes were made.  She denies orthopnea, PND, lower extreme swelling, and other cardiac complaints.   Wt Readings from Last 3  Encounters:  04/10/14 168 lb (76.204 kg)  04/08/13 145 lb (65.772 kg)     No past medical history on file.  Current Outpatient Prescriptions  Medication Sig Dispense Refill  . amLODipine (NORVASC) 5 MG tablet Take 1 tablet (5 mg total) by mouth daily. 90 tablet 0  . dorzolamide (TRUSOPT) 2 % ophthalmic solution 1 drop 3 (three) times daily.    . hydrochlorothiazide (HYDRODIURIL) 25 MG tablet Take 0.5 tablets (12.5 mg total) by mouth daily. 45 tablet 3  . lisinopril (PRINIVIL,ZESTRIL) 20 MG tablet Take 1.5 tablets (30 mg total) by mouth daily. 135 tablet 0  . nitrofurantoin, macrocrystal-monohydrate, (MACROBID) 100 MG capsule Take 100 mg by mouth 2 (two) times daily.     Marland Kitchen omeprazole (PRILOSEC) 20 MG capsule Take 20 mg by mouth daily.    Marland Kitchen oxybutynin (DITROPAN) 5 MG tablet Take 5 mg by mouth 3 (three) times daily.    . promethazine (PHENERGAN) 12.5 MG tablet Take 12.5 mg by mouth every 6 (six) hours as needed for nausea or vomiting.    . travoprost, benzalkonium, (TRAVATAN) 0.004 % ophthalmic solution 1 drop at bedtime.    Marland Kitchen warfarin (COUMADIN) 3 MG tablet Take as directed by coumadin clinic 70 tablet 1   No current facility-administered medications for this visit.    Allergies:    Allergies  Allergen Reactions  . Codeine Nausea And Vomiting  . Oxycodone Nausea And Vomiting    Social History:  The patient  reports that she has never smoked. She does not have any smokeless tobacco history on file.   ROS:  Please see the history of present illness.  She has not had blood in her stool. Last diarrhea was Monday. She complains of abdominal bloating. She has anorexia. Has no real abdominal discomfort. She denies chills and fever. She denies dysuria.   All other systems reviewed and negative.   OBJECTIVE: VS:  BP 114/60 mmHg  Pulse 82  Ht 5' (1.524 m)  Wt 168 lb (76.204 kg)  BMI 32.81 kg/m2 Well nourished, well developed, in no acute distress, elderly and chronically  ill-appearing HEENT: normal Neck: JVD flat. Carotid bruit absent  Cardiac:  normal S1, S2; IIRR; no murmur Lungs:  clear to auscultation bilaterally, no wheezing, rhonchi or rales Abd: soft, nontender, no hepatomegaly Ext: Edema absent. Pulses 2+ Skin: warm and dry Neuro:  CNs 2-12 intact, no focal abnormalities noted  EKG:  Atrial fibrillation with controlled ventricular response at 82 bpm       Signed, Illene Labrador III, MD 04/10/2014 10:19 AM

## 2014-04-10 NOTE — Patient Instructions (Signed)
Your physician has recommended you make the following change in your medication:  1) HOLD Lisinpril and HCTZ until your are able to keep fluids down and diarrhea has resolved  Call your primary care physician ( Dr.McNeil) TODAY to follow up on labs that were drawn and their recommendations  Your physician wants you to follow-up in: 1 year with Dr.Smith You will receive a reminder letter in the mail two months in advance. If you don't receive a letter, please call our office to schedule the follow-up appointment.

## 2014-04-13 ENCOUNTER — Other Ambulatory Visit: Payer: Self-pay | Admitting: Physician Assistant

## 2014-04-13 DIAGNOSIS — R748 Abnormal levels of other serum enzymes: Secondary | ICD-10-CM

## 2014-04-17 ENCOUNTER — Ambulatory Visit
Admission: RE | Admit: 2014-04-17 | Discharge: 2014-04-17 | Disposition: A | Discharge status: Home / Self Care | Payer: Commercial Managed Care - HMO | Source: Ambulatory Visit | Attending: Physician Assistant | Admitting: Physician Assistant

## 2014-04-17 DIAGNOSIS — R748 Abnormal levels of other serum enzymes: Secondary | ICD-10-CM

## 2014-04-17 MED ORDER — IOHEXOL 300 MG/ML  SOLN
100.0000 mL | Freq: Once | INTRAMUSCULAR | Status: AC | PRN
  Administered 2014-04-17: 100 mL via INTRAVENOUS

## 2014-05-28 DIAGNOSIS — I480 Paroxysmal atrial fibrillation: Secondary | ICD-10-CM | POA: Diagnosis not present

## 2014-05-28 DIAGNOSIS — R899 Unspecified abnormal finding in specimens from other organs, systems and tissues: Secondary | ICD-10-CM | POA: Diagnosis not present

## 2014-05-28 DIAGNOSIS — Z7901 Long term (current) use of anticoagulants: Secondary | ICD-10-CM | POA: Diagnosis not present

## 2014-06-04 DIAGNOSIS — R748 Abnormal levels of other serum enzymes: Secondary | ICD-10-CM | POA: Diagnosis not present

## 2014-06-04 DIAGNOSIS — H4011X3 Primary open-angle glaucoma, severe stage: Secondary | ICD-10-CM | POA: Diagnosis not present

## 2014-06-04 DIAGNOSIS — H4011X1 Primary open-angle glaucoma, mild stage: Secondary | ICD-10-CM | POA: Diagnosis not present

## 2014-06-04 DIAGNOSIS — L819 Disorder of pigmentation, unspecified: Secondary | ICD-10-CM | POA: Diagnosis not present

## 2014-06-12 ENCOUNTER — Other Ambulatory Visit: Payer: Self-pay

## 2014-06-12 MED ORDER — LISINOPRIL 20 MG PO TABS: 30.0000 mg | ORAL_TABLET | Freq: Every day | ORAL | Status: DC

## 2014-06-12 MED ORDER — HYDROCHLOROTHIAZIDE 25 MG PO TABS: 12.5000 mg | ORAL_TABLET | Freq: Every day | ORAL | Status: DC

## 2014-06-12 MED ORDER — AMLODIPINE BESYLATE 5 MG PO TABS: 5.0000 mg | ORAL_TABLET | Freq: Every day | ORAL | Status: DC

## 2014-06-12 NOTE — Telephone Encounter (Signed)
Called spoke with pt advised [employer] sent [place] a refill request for Warfarin she no longer follows in our clinic, needs to contact [facility] or Dr Guido Sander office where she currently has her Coumadin managed and monitored to notify of refill needed.  Pt verbalized understanding.

## 2014-06-25 DIAGNOSIS — I4891 Unspecified atrial fibrillation: Secondary | ICD-10-CM | POA: Diagnosis not present

## 2014-06-25 DIAGNOSIS — Z7901 Long term (current) use of anticoagulants: Secondary | ICD-10-CM | POA: Diagnosis not present

## 2014-07-02 DIAGNOSIS — I480 Paroxysmal atrial fibrillation: Secondary | ICD-10-CM | POA: Diagnosis not present

## 2014-07-02 DIAGNOSIS — Z7901 Long term (current) use of anticoagulants: Secondary | ICD-10-CM | POA: Diagnosis not present

## 2014-07-30 DIAGNOSIS — I480 Paroxysmal atrial fibrillation: Secondary | ICD-10-CM | POA: Diagnosis not present

## 2014-07-30 DIAGNOSIS — Z7901 Long term (current) use of anticoagulants: Secondary | ICD-10-CM | POA: Diagnosis not present

## 2014-08-27 DIAGNOSIS — I480 Paroxysmal atrial fibrillation: Secondary | ICD-10-CM | POA: Diagnosis not present

## 2014-08-27 DIAGNOSIS — Z7901 Long term (current) use of anticoagulants: Secondary | ICD-10-CM | POA: Diagnosis not present

## 2014-09-03 DIAGNOSIS — L989 Disorder of the skin and subcutaneous tissue, unspecified: Secondary | ICD-10-CM | POA: Diagnosis not present

## 2014-09-03 DIAGNOSIS — I4891 Unspecified atrial fibrillation: Secondary | ICD-10-CM | POA: Diagnosis not present

## 2014-09-03 DIAGNOSIS — Z7901 Long term (current) use of anticoagulants: Secondary | ICD-10-CM | POA: Diagnosis not present

## 2014-09-21 DIAGNOSIS — I480 Paroxysmal atrial fibrillation: Secondary | ICD-10-CM | POA: Diagnosis not present

## 2014-09-21 DIAGNOSIS — Z7901 Long term (current) use of anticoagulants: Secondary | ICD-10-CM | POA: Diagnosis not present

## 2014-09-22 DIAGNOSIS — L304 Erythema intertrigo: Secondary | ICD-10-CM | POA: Diagnosis not present

## 2014-09-22 DIAGNOSIS — L570 Actinic keratosis: Secondary | ICD-10-CM | POA: Diagnosis not present

## 2014-10-15 DIAGNOSIS — Z7901 Long term (current) use of anticoagulants: Secondary | ICD-10-CM | POA: Diagnosis not present

## 2014-10-15 DIAGNOSIS — I480 Paroxysmal atrial fibrillation: Secondary | ICD-10-CM | POA: Diagnosis not present

## 2014-10-29 DIAGNOSIS — Z7901 Long term (current) use of anticoagulants: Secondary | ICD-10-CM | POA: Diagnosis not present

## 2014-10-29 DIAGNOSIS — I480 Paroxysmal atrial fibrillation: Secondary | ICD-10-CM | POA: Diagnosis not present

## 2014-10-30 DIAGNOSIS — H4011X3 Primary open-angle glaucoma, severe stage: Secondary | ICD-10-CM | POA: Diagnosis not present

## 2014-10-30 DIAGNOSIS — H4011X1 Primary open-angle glaucoma, mild stage: Secondary | ICD-10-CM | POA: Diagnosis not present

## 2014-10-30 DIAGNOSIS — Z961 Presence of intraocular lens: Secondary | ICD-10-CM | POA: Diagnosis not present

## 2014-10-30 DIAGNOSIS — H04129 Dry eye syndrome of unspecified lacrimal gland: Secondary | ICD-10-CM | POA: Diagnosis not present

## 2014-11-03 DIAGNOSIS — L578 Other skin changes due to chronic exposure to nonionizing radiation: Secondary | ICD-10-CM | POA: Diagnosis not present

## 2014-11-26 DIAGNOSIS — Z7901 Long term (current) use of anticoagulants: Secondary | ICD-10-CM | POA: Diagnosis not present

## 2014-11-26 DIAGNOSIS — I480 Paroxysmal atrial fibrillation: Secondary | ICD-10-CM | POA: Diagnosis not present

## 2014-12-03 DIAGNOSIS — Z7901 Long term (current) use of anticoagulants: Secondary | ICD-10-CM | POA: Diagnosis not present

## 2014-12-03 DIAGNOSIS — I480 Paroxysmal atrial fibrillation: Secondary | ICD-10-CM | POA: Diagnosis not present

## 2014-12-07 DIAGNOSIS — I480 Paroxysmal atrial fibrillation: Secondary | ICD-10-CM | POA: Diagnosis not present

## 2014-12-07 DIAGNOSIS — Z7901 Long term (current) use of anticoagulants: Secondary | ICD-10-CM | POA: Diagnosis not present

## 2014-12-15 ENCOUNTER — Other Ambulatory Visit: Payer: Self-pay | Admitting: Interventional Cardiology

## 2015-01-07 DIAGNOSIS — I480 Paroxysmal atrial fibrillation: Secondary | ICD-10-CM | POA: Diagnosis not present

## 2015-01-07 DIAGNOSIS — Z7901 Long term (current) use of anticoagulants: Secondary | ICD-10-CM | POA: Diagnosis not present

## 2015-02-04 DIAGNOSIS — Z7901 Long term (current) use of anticoagulants: Secondary | ICD-10-CM | POA: Diagnosis not present

## 2015-02-04 DIAGNOSIS — I480 Paroxysmal atrial fibrillation: Secondary | ICD-10-CM | POA: Diagnosis not present

## 2015-03-02 DIAGNOSIS — L720 Epidermal cyst: Secondary | ICD-10-CM | POA: Diagnosis not present

## 2015-03-02 DIAGNOSIS — L570 Actinic keratosis: Secondary | ICD-10-CM | POA: Diagnosis not present

## 2015-03-02 DIAGNOSIS — L821 Other seborrheic keratosis: Secondary | ICD-10-CM | POA: Diagnosis not present

## 2015-03-04 DIAGNOSIS — I480 Paroxysmal atrial fibrillation: Secondary | ICD-10-CM | POA: Diagnosis not present

## 2015-03-04 DIAGNOSIS — Z7901 Long term (current) use of anticoagulants: Secondary | ICD-10-CM | POA: Diagnosis not present

## 2015-03-09 DIAGNOSIS — H401113 Primary open-angle glaucoma, right eye, severe stage: Secondary | ICD-10-CM | POA: Diagnosis not present

## 2015-03-09 DIAGNOSIS — H401121 Primary open-angle glaucoma, left eye, mild stage: Secondary | ICD-10-CM | POA: Diagnosis not present

## 2015-03-31 DIAGNOSIS — I480 Paroxysmal atrial fibrillation: Secondary | ICD-10-CM | POA: Diagnosis not present

## 2015-03-31 DIAGNOSIS — Z7901 Long term (current) use of anticoagulants: Secondary | ICD-10-CM | POA: Diagnosis not present

## 2015-04-14 DIAGNOSIS — Z7901 Long term (current) use of anticoagulants: Secondary | ICD-10-CM | POA: Diagnosis not present

## 2015-04-14 DIAGNOSIS — I480 Paroxysmal atrial fibrillation: Secondary | ICD-10-CM | POA: Diagnosis not present

## 2015-04-28 ENCOUNTER — Other Ambulatory Visit: Payer: Self-pay | Admitting: Interventional Cardiology

## 2015-05-06 ENCOUNTER — Other Ambulatory Visit: Payer: Self-pay | Admitting: Interventional Cardiology

## 2015-05-13 DIAGNOSIS — Z7901 Long term (current) use of anticoagulants: Secondary | ICD-10-CM | POA: Diagnosis not present

## 2015-05-13 DIAGNOSIS — I4891 Unspecified atrial fibrillation: Secondary | ICD-10-CM | POA: Diagnosis not present

## 2015-05-21 DIAGNOSIS — H401121 Primary open-angle glaucoma, left eye, mild stage: Secondary | ICD-10-CM | POA: Diagnosis not present

## 2015-05-21 DIAGNOSIS — H401113 Primary open-angle glaucoma, right eye, severe stage: Secondary | ICD-10-CM | POA: Diagnosis not present

## 2015-06-05 DIAGNOSIS — R350 Frequency of micturition: Secondary | ICD-10-CM | POA: Diagnosis not present

## 2015-06-05 DIAGNOSIS — R319 Hematuria, unspecified: Secondary | ICD-10-CM | POA: Diagnosis not present

## 2015-06-05 DIAGNOSIS — K056 Periodontal disease, unspecified: Secondary | ICD-10-CM | POA: Diagnosis not present

## 2015-06-07 ENCOUNTER — Telehealth: Payer: Self-pay | Admitting: Interventional Cardiology

## 2015-06-07 DIAGNOSIS — R42 Dizziness and giddiness: Secondary | ICD-10-CM | POA: Diagnosis not present

## 2015-06-07 DIAGNOSIS — I480 Paroxysmal atrial fibrillation: Secondary | ICD-10-CM | POA: Diagnosis not present

## 2015-06-07 DIAGNOSIS — Z7901 Long term (current) use of anticoagulants: Secondary | ICD-10-CM | POA: Diagnosis not present

## 2015-06-07 DIAGNOSIS — I10 Essential (primary) hypertension: Secondary | ICD-10-CM | POA: Diagnosis not present

## 2015-06-07 DIAGNOSIS — I495 Sick sinus syndrome: Secondary | ICD-10-CM | POA: Diagnosis not present

## 2015-06-07 DIAGNOSIS — Z79899 Other long term (current) drug therapy: Secondary | ICD-10-CM | POA: Diagnosis not present

## 2015-06-07 DIAGNOSIS — H6120 Impacted cerumen, unspecified ear: Secondary | ICD-10-CM | POA: Diagnosis not present

## 2015-06-07 NOTE — Telephone Encounter (Signed)
Dr. Drema Dallas is wanting to speak with the DOD to have them to look at EKG that had some changes.

## 2015-06-07 NOTE — Telephone Encounter (Signed)
Dr Rayann Heman spoke with Dr Drema Dallas and reviewed EKG.  He also faxed over Dr Thompson Caul last note from 06/2013 and let her know the patient has an upcoming appointment on 06/23/15 with Dr Tamala Julian.  Her EKG today was afib with artfact not flutter.

## 2015-06-10 DIAGNOSIS — N179 Acute kidney failure, unspecified: Secondary | ICD-10-CM | POA: Diagnosis not present

## 2015-06-10 DIAGNOSIS — R748 Abnormal levels of other serum enzymes: Secondary | ICD-10-CM | POA: Diagnosis not present

## 2015-06-10 DIAGNOSIS — E860 Dehydration: Secondary | ICD-10-CM | POA: Diagnosis not present

## 2015-06-14 ENCOUNTER — Encounter: Payer: Self-pay | Admitting: Interventional Cardiology

## 2015-06-14 ENCOUNTER — Telehealth: Payer: Self-pay | Admitting: Interventional Cardiology

## 2015-06-14 NOTE — Telephone Encounter (Signed)
Called pt and left message asking pt to give our office a call back to update her medical and Fm Hx.

## 2015-06-16 DIAGNOSIS — E860 Dehydration: Secondary | ICD-10-CM | POA: Diagnosis not present

## 2015-06-16 DIAGNOSIS — G2581 Restless legs syndrome: Secondary | ICD-10-CM | POA: Diagnosis not present

## 2015-06-16 DIAGNOSIS — I480 Paroxysmal atrial fibrillation: Secondary | ICD-10-CM | POA: Diagnosis not present

## 2015-06-16 DIAGNOSIS — I10 Essential (primary) hypertension: Secondary | ICD-10-CM | POA: Diagnosis not present

## 2015-06-22 DIAGNOSIS — N939 Abnormal uterine and vaginal bleeding, unspecified: Secondary | ICD-10-CM | POA: Diagnosis not present

## 2015-06-23 ENCOUNTER — Ambulatory Visit (INDEPENDENT_AMBULATORY_CARE_PROVIDER_SITE_OTHER): Payer: Commercial Managed Care - HMO | Admitting: Interventional Cardiology

## 2015-06-23 ENCOUNTER — Encounter: Payer: Self-pay | Admitting: Interventional Cardiology

## 2015-06-23 VITALS — BP 166/94 | HR 75 | Ht 60.0 in | Wt 176.8 lb

## 2015-06-23 DIAGNOSIS — I10 Essential (primary) hypertension: Secondary | ICD-10-CM

## 2015-06-23 DIAGNOSIS — Z7901 Long term (current) use of anticoagulants: Secondary | ICD-10-CM

## 2015-06-23 DIAGNOSIS — R0609 Other forms of dyspnea: Secondary | ICD-10-CM | POA: Diagnosis not present

## 2015-06-23 DIAGNOSIS — I480 Paroxysmal atrial fibrillation: Secondary | ICD-10-CM

## 2015-06-23 DIAGNOSIS — R0789 Other chest pain: Secondary | ICD-10-CM

## 2015-06-23 MED ORDER — HYDROCHLOROTHIAZIDE 12.5 MG PO CAPS: 12.5000 mg | ORAL_CAPSULE | Freq: Every day | ORAL | Status: DC

## 2015-06-23 NOTE — Patient Instructions (Addendum)
Medication Instructions:  Your physician has recommended you make the following change in your medication:  RESUME HCTZ 12.5mg  daily.   Labwork: Your physician recommends that you return for lab work Artist) same day as lexiscan  Testing/Procedures: Your physician has requested that you have a lexiscan myoview. For further information please visit [email or web address]. Please follow instruction sheet, as given.    Follow-Up: Your physician recommends that you schedule a follow-up appointment pending results   Any Other Special Instructions Will Be Listed Below (If Applicable).     If you need a refill on your cardiac medications before your next appointment, please call your pharmacy.

## 2015-06-23 NOTE — Progress Notes (Signed)
Cardiology Office Note   Date:  06/23/2015   ID:  Olesia, Mckinstry [birth year], MRN [id number]  PCP:  No primary care provider on file.  Cardiologist:  Sinclair Grooms, MD   Chief Complaint  Patient presents with  . Atrial Fibrillation      History of Present Illness: Danielle Thomas is a 82 y.o. female who presents for  Follow-up with history of embolic stroke, paroxysmal atrial fibrillation, chronic anticoagulation therapy, hypertension, and hyperlipidemia.   This is her annual follow-up for paroxysmal A. Fib. No specific complaints concerning that problem but a  5-7 day history of exertional dyspnea. There is no orthopnea or lower extremity swelling. In further detail, the patient had her antihypertensive regimen adjusted on January 30 because of orthostasis and evidence of acute kidney injury by blood work with a creatinine of 1.63 and BUN of 53. Hydrochlorothiazide was stopped. Lisinopril was reduced to 10 mg per day. Amlodipine was reduced to 2.5 mg per day. She was encouraged to drink fluids. On follow-up blood pressure had improved as well as kidney function. Amlodipine was reduced to 2.5 mg per day. She now states that over the past 5-7 days dyspnea on exertion.   She also confesses to chest tightness that occurred on Super Bowl evening that lasted several hours and resolved. There was no associated shortness of breath. The discomfort has not recurred. Exertional dyspnea is not associated with chest discomfort    Past Medical History  Diagnosis Date  . Hypertension     Past Surgical History  Procedure Laterality Date  . Knee surgery    . Partial hysterectomy    . Tonsillectomy and adenoidectomy    . Lung biopsy       Current Outpatient Prescriptions  Medication Sig Dispense Refill  . amLODipine (NORVASC) 2.5 MG tablet Take 2.5 mg by mouth daily.    . dorzolamide (TRUSOPT) 2 % ophthalmic solution 1 drop 3 (three) times daily.    Marland Kitchen lisinopril (PRINIVIL,ZESTRIL)  20 MG tablet Take 20 mg by mouth daily.    . travoprost, benzalkonium, (TRAVATAN) 0.004 % ophthalmic solution 1 drop at bedtime.    Marland Kitchen warfarin (COUMADIN) 3 MG tablet Take as directed by coumadin clinic 70 tablet 1   No current facility-administered medications for this visit.    Allergies:   Codeine and Oxycodone    Social History:  The patient  reports that she has never smoked. She has never used smokeless tobacco.   Family History:  The patient's family history is not on file.    ROS:  Please see the history of present illness.   Otherwise, review of systems are positive for . Frequent urination.   All other systems are reviewed and negative.    PHYSICAL EXAM: VS:  BP 166/94 mmHg  Pulse 75  Ht 5' (1.524 m)  Wt 176 lb 12.8 oz (80.196 kg)  BMI 34.53 kg/m2 , BMI Body mass index is 34.53 kg/(m^2). GEN: Well nourished, well developed, in no acute distress HEENT: normal Neck:  Neck veins are elevated to the angle of the jaw with the patient lying at 45. There is no carotid bruit, or masses Cardiac: RRR.  There is no murmur, rub, or gallop. There is no edema. Respiratory:  clear to auscultation bilaterally, normal work of breathing. GI: soft, nontender, nondistended, + BS MS: no deformity or atrophy Skin: warm and dry, no rash Neuro:  Strength and sensation are intact Psych: euthymic mood, full affect  EKG:  EKG is ordered today. The ekg reveals  Normal sinus rhythm , first-degree AV block, in no acute change or evidence of prior infarction.   Recent Labs: No results found for requested labs within last 365 days.    Lipid Panel No results found for: CHOL, TRIG, HDL, CHOLHDL, VLDL, LDLCALC, LDLDIRECT    Wt Readings from Last 3 Encounters:  06/23/15 176 lb 12.8 oz (80.196 kg)  04/10/14 168 lb (76.204 kg)  04/08/13 145 lb (65.772 kg)      Other studies Reviewed: Additional studies/ records that were reviewed today include:  I reviewed records from Dr. Leonides Schanz at  [place].. The findings include  Clinical evidence of dehydration all laboratory data from 06/07/2015..    ASSESSMENT AND PLAN:  5. New exertional dyspnea  suspect this is related to acute on chronic diastolic heart failure and recent withdrawal of diuretic therapy. Blood pressure is also significantly elevated.  1. Paroxysmal atrial fibrillation (HCC)  no clinical recurrences  2. Essential hypertension, benign  now with significant blood pressure elevation in the range of 99991111 systolic over 123456 mm mercury diastolic.  3.  Chest pressure  Rule out CAD  4. Chronic anticoagulation  chronic Coumadin therapy without complications    Current medicines are reviewed at length with the patient today.  The patient has the following concerns regarding medicines:  I discussed her recent medication changes.  The following changes/actions have been instituted:    Resume hydrochlorothiazide 12.5 mg per day   Stress myocardial perfusion study to exclude the possibility of CAD causing chest pressure and subsequent dyspnea on exertion.  Basic metabolic panel in one week  Labs/ tests ordered today include:  No orders of the defined types were placed in this encounter.     Disposition:   FU with HS in 1 month  Signed, Sinclair Grooms, MD  06/23/2015 3:25 PM    [facility] [place], [place], [place]  [address] Phone: [phone]; Fax: [phone]

## 2015-06-25 ENCOUNTER — Telehealth: Payer: Self-pay | Admitting: Interventional Cardiology

## 2015-06-25 NOTE — Telephone Encounter (Signed)
New Message:  Pt is calling in because her BP has not dropped since the last time she was in and she is concerned. She would like to know if she needs to adjust her medication dosages. Please call.

## 2015-06-25 NOTE — Telephone Encounter (Signed)
Patient called because her BP hasn't dropped since the doctor changed her medication at her office visit on 06/23/15 BP yesterday was 132/94 Asked patient to take it once a day for the next three days and call with BP on Monday to see if Dr. Tamala Julian would like to make further adjustments with her medications. She started back on HCTZ 12.5 mg daily on 2/15

## 2015-06-30 NOTE — Telephone Encounter (Signed)
Saturday 2/18 178/115 Sunday   2/19  177/108 Monday   2/20  143/85 Tuesday  2/21  163/94 Wednes  2/22   154/89  Patient reporting BP for the last 5 days since starting back on HCTZ 12.5 mg daily on 06/23/15

## 2015-07-01 NOTE — Telephone Encounter (Signed)
Yes

## 2015-07-02 ENCOUNTER — Telehealth: Payer: Self-pay | Admitting: Interventional Cardiology

## 2015-07-02 NOTE — Telephone Encounter (Signed)
Pt have stress test on Wednesday,wants to know if she should take her medicine? If not there,please leave her a message.

## 2015-07-02 NOTE — Telephone Encounter (Signed)
Left voice mail at patient's request that there are no medications that she needs to hold for stress test next Wednesday She may want to wait until she get home to take the Hydrochlorothiazide so she will not have to urinate during the procedure.

## 2015-07-05 ENCOUNTER — Telehealth (HOSPITAL_COMMUNITY): Payer: Self-pay | Admitting: *Deleted

## 2015-07-05 NOTE — Telephone Encounter (Signed)
Patient given detailed instructions per Myocardial Perfusion Study Information Sheet for the test on 3/1/17Patient notified to arrive 15 minutes early and that it is imperative to arrive on time for appointment to keep from having the test rescheduled.  If you need to cancel or reschedule your appointment, please call the office within 24 hours of your appointment. Failure to do so may result in a cancellation of your appointment, and a $50 no show fee. Patient verbalized understanding. Jakorey Mcconathy J Miklo Aken, RN  

## 2015-07-07 ENCOUNTER — Ambulatory Visit (HOSPITAL_COMMUNITY): Payer: Commercial Managed Care - HMO | Attending: Interventional Cardiology

## 2015-07-07 ENCOUNTER — Other Ambulatory Visit (INDEPENDENT_AMBULATORY_CARE_PROVIDER_SITE_OTHER): Payer: Commercial Managed Care - HMO | Admitting: *Deleted

## 2015-07-07 DIAGNOSIS — R0609 Other forms of dyspnea: Secondary | ICD-10-CM | POA: Insufficient documentation

## 2015-07-07 DIAGNOSIS — I10 Essential (primary) hypertension: Secondary | ICD-10-CM | POA: Diagnosis not present

## 2015-07-07 DIAGNOSIS — R079 Chest pain, unspecified: Secondary | ICD-10-CM | POA: Diagnosis not present

## 2015-07-07 DIAGNOSIS — I480 Paroxysmal atrial fibrillation: Secondary | ICD-10-CM

## 2015-07-07 DIAGNOSIS — I4891 Unspecified atrial fibrillation: Secondary | ICD-10-CM | POA: Diagnosis not present

## 2015-07-07 LAB — BASIC METABOLIC PANEL
BUN: 24 mg/dL (ref 7–25)
CALCIUM: 8.8 mg/dL (ref 8.6–10.4)
CO2: 25 mmol/L (ref 20–31)
CREATININE: 1.02 mg/dL — AB (ref 0.60–0.88)
Chloride: 107 mmol/L (ref 98–110)
Glucose, Bld: 94 mg/dL (ref 65–99)
Potassium: 4.1 mmol/L (ref 3.5–5.3)
SODIUM: 141 mmol/L (ref 135–146)

## 2015-07-07 LAB — MYOCARDIAL PERFUSION IMAGING
CHL CUP NUCLEAR SRS: 3
CHL CUP NUCLEAR SSS: 5
CHL CUP RESTING HR STRESS: 64 {beats}/min
CSEPPHR: 81 {beats}/min
LV dias vol: 94 mL
LVSYSVOL: 47 mL
RATE: 0.33
SDS: 2
TID: 1.12

## 2015-07-07 MED ORDER — REGADENOSON 0.4 MG/5ML IV SOLN
0.4000 mg | Freq: Once | INTRAVENOUS | Status: AC
  Administered 2015-07-07: 0.4 mg via INTRAVENOUS

## 2015-07-07 MED ORDER — TECHNETIUM TC 99M SESTAMIBI GENERIC - CARDIOLITE
32.6000 | Freq: Once | INTRAVENOUS | Status: AC | PRN
  Administered 2015-07-07: 33 via INTRAVENOUS

## 2015-07-07 MED ORDER — TECHNETIUM TC 99M SESTAMIBI GENERIC - CARDIOLITE
10.8000 | Freq: Once | INTRAVENOUS | Status: AC | PRN
  Administered 2015-07-07: 11 via INTRAVENOUS

## 2015-07-14 DIAGNOSIS — H6122 Impacted cerumen, left ear: Secondary | ICD-10-CM | POA: Diagnosis not present

## 2015-07-21 DIAGNOSIS — Z7901 Long term (current) use of anticoagulants: Secondary | ICD-10-CM | POA: Diagnosis not present

## 2015-08-23 DIAGNOSIS — Z7901 Long term (current) use of anticoagulants: Secondary | ICD-10-CM | POA: Diagnosis not present

## 2015-08-23 DIAGNOSIS — I480 Paroxysmal atrial fibrillation: Secondary | ICD-10-CM | POA: Diagnosis not present

## 2015-09-06 DIAGNOSIS — Z7901 Long term (current) use of anticoagulants: Secondary | ICD-10-CM | POA: Diagnosis not present

## 2015-09-06 DIAGNOSIS — I480 Paroxysmal atrial fibrillation: Secondary | ICD-10-CM | POA: Diagnosis not present

## 2015-10-06 DIAGNOSIS — I480 Paroxysmal atrial fibrillation: Secondary | ICD-10-CM | POA: Diagnosis not present

## 2015-10-06 DIAGNOSIS — Z7901 Long term (current) use of anticoagulants: Secondary | ICD-10-CM | POA: Diagnosis not present

## 2015-10-08 DIAGNOSIS — I480 Paroxysmal atrial fibrillation: Secondary | ICD-10-CM | POA: Diagnosis not present

## 2015-10-08 DIAGNOSIS — Z7901 Long term (current) use of anticoagulants: Secondary | ICD-10-CM | POA: Diagnosis not present

## 2015-10-12 DIAGNOSIS — I480 Paroxysmal atrial fibrillation: Secondary | ICD-10-CM | POA: Diagnosis not present

## 2015-10-12 DIAGNOSIS — Z7901 Long term (current) use of anticoagulants: Secondary | ICD-10-CM | POA: Diagnosis not present

## 2015-10-26 DIAGNOSIS — Z7901 Long term (current) use of anticoagulants: Secondary | ICD-10-CM | POA: Diagnosis not present

## 2015-10-26 DIAGNOSIS — I480 Paroxysmal atrial fibrillation: Secondary | ICD-10-CM | POA: Diagnosis not present

## 2015-11-01 DIAGNOSIS — H401121 Primary open-angle glaucoma, left eye, mild stage: Secondary | ICD-10-CM | POA: Diagnosis not present

## 2015-11-01 DIAGNOSIS — H401113 Primary open-angle glaucoma, right eye, severe stage: Secondary | ICD-10-CM | POA: Diagnosis not present

## 2015-11-01 DIAGNOSIS — H31011 Macula scars of posterior pole (postinflammatory) (post-traumatic), right eye: Secondary | ICD-10-CM | POA: Diagnosis not present

## 2015-11-01 DIAGNOSIS — Z961 Presence of intraocular lens: Secondary | ICD-10-CM | POA: Diagnosis not present

## 2015-11-02 ENCOUNTER — Other Ambulatory Visit: Payer: Self-pay | Admitting: Interventional Cardiology

## 2015-11-10 ENCOUNTER — Other Ambulatory Visit: Payer: Self-pay | Admitting: *Deleted

## 2015-11-10 MED ORDER — HYDROCHLOROTHIAZIDE 12.5 MG PO CAPS: 12.5000 mg | ORAL_CAPSULE | Freq: Every day | ORAL | Status: DC

## 2015-11-23 DIAGNOSIS — I480 Paroxysmal atrial fibrillation: Secondary | ICD-10-CM | POA: Diagnosis not present

## 2015-11-23 DIAGNOSIS — Z7901 Long term (current) use of anticoagulants: Secondary | ICD-10-CM | POA: Diagnosis not present

## 2015-12-09 DIAGNOSIS — G2581 Restless legs syndrome: Secondary | ICD-10-CM | POA: Diagnosis not present

## 2015-12-09 DIAGNOSIS — Z79899 Other long term (current) drug therapy: Secondary | ICD-10-CM | POA: Diagnosis not present

## 2015-12-09 DIAGNOSIS — H6122 Impacted cerumen, left ear: Secondary | ICD-10-CM | POA: Diagnosis not present

## 2015-12-09 DIAGNOSIS — I495 Sick sinus syndrome: Secondary | ICD-10-CM | POA: Diagnosis not present

## 2015-12-09 DIAGNOSIS — K30 Functional dyspepsia: Secondary | ICD-10-CM | POA: Diagnosis not present

## 2015-12-09 DIAGNOSIS — I10 Essential (primary) hypertension: Secondary | ICD-10-CM | POA: Diagnosis not present

## 2015-12-09 DIAGNOSIS — I480 Paroxysmal atrial fibrillation: Secondary | ICD-10-CM | POA: Diagnosis not present

## 2015-12-09 DIAGNOSIS — H409 Unspecified glaucoma: Secondary | ICD-10-CM | POA: Diagnosis not present

## 2015-12-09 DIAGNOSIS — R350 Frequency of micturition: Secondary | ICD-10-CM | POA: Diagnosis not present

## 2015-12-11 ENCOUNTER — Other Ambulatory Visit: Payer: Self-pay | Admitting: Interventional Cardiology

## 2015-12-13 ENCOUNTER — Other Ambulatory Visit: Payer: Self-pay | Admitting: *Deleted

## 2015-12-13 MED ORDER — LISINOPRIL 20 MG PO TABS: ORAL_TABLET | ORAL | 3 refills | Status: DC

## 2015-12-13 MED ORDER — LISINOPRIL 20 MG PO TABS: ORAL_TABLET | ORAL | 0 refills | Status: DC

## 2015-12-15 ENCOUNTER — Other Ambulatory Visit: Payer: Self-pay

## 2015-12-15 MED ORDER — AMLODIPINE BESYLATE 2.5 MG PO TABS: 2.5000 mg | ORAL_TABLET | Freq: Every day | ORAL | 1 refills | Status: DC

## 2015-12-20 DIAGNOSIS — I480 Paroxysmal atrial fibrillation: Secondary | ICD-10-CM | POA: Diagnosis not present

## 2015-12-20 DIAGNOSIS — Z7901 Long term (current) use of anticoagulants: Secondary | ICD-10-CM | POA: Diagnosis not present

## 2015-12-20 DIAGNOSIS — M1990 Unspecified osteoarthritis, unspecified site: Secondary | ICD-10-CM | POA: Diagnosis not present

## 2015-12-22 DIAGNOSIS — Z7901 Long term (current) use of anticoagulants: Secondary | ICD-10-CM | POA: Diagnosis not present

## 2015-12-23 ENCOUNTER — Ambulatory Visit: Payer: Commercial Managed Care - HMO | Admitting: Physical Therapy

## 2015-12-30 DIAGNOSIS — I480 Paroxysmal atrial fibrillation: Secondary | ICD-10-CM | POA: Diagnosis not present

## 2015-12-30 DIAGNOSIS — Z7901 Long term (current) use of anticoagulants: Secondary | ICD-10-CM | POA: Diagnosis not present

## 2016-01-06 DIAGNOSIS — Z7901 Long term (current) use of anticoagulants: Secondary | ICD-10-CM | POA: Diagnosis not present

## 2016-01-06 DIAGNOSIS — I480 Paroxysmal atrial fibrillation: Secondary | ICD-10-CM | POA: Diagnosis not present

## 2016-01-24 DIAGNOSIS — Z7901 Long term (current) use of anticoagulants: Secondary | ICD-10-CM | POA: Diagnosis not present

## 2016-01-24 DIAGNOSIS — I480 Paroxysmal atrial fibrillation: Secondary | ICD-10-CM | POA: Diagnosis not present

## 2016-01-31 DIAGNOSIS — M109 Gout, unspecified: Secondary | ICD-10-CM | POA: Diagnosis not present

## 2016-02-07 DIAGNOSIS — Z7901 Long term (current) use of anticoagulants: Secondary | ICD-10-CM | POA: Diagnosis not present

## 2016-02-07 DIAGNOSIS — I4891 Unspecified atrial fibrillation: Secondary | ICD-10-CM | POA: Diagnosis not present

## 2016-02-15 DIAGNOSIS — G2581 Restless legs syndrome: Secondary | ICD-10-CM | POA: Diagnosis not present

## 2016-02-15 DIAGNOSIS — D485 Neoplasm of uncertain behavior of skin: Secondary | ICD-10-CM | POA: Diagnosis not present

## 2016-02-15 DIAGNOSIS — M79671 Pain in right foot: Secondary | ICD-10-CM | POA: Diagnosis not present

## 2016-02-18 DIAGNOSIS — L570 Actinic keratosis: Secondary | ICD-10-CM | POA: Diagnosis not present

## 2016-02-18 DIAGNOSIS — D485 Neoplasm of uncertain behavior of skin: Secondary | ICD-10-CM | POA: Diagnosis not present

## 2016-02-18 DIAGNOSIS — L821 Other seborrheic keratosis: Secondary | ICD-10-CM | POA: Diagnosis not present

## 2016-02-18 DIAGNOSIS — C44329 Squamous cell carcinoma of skin of other parts of face: Secondary | ICD-10-CM | POA: Diagnosis not present

## 2016-02-24 DIAGNOSIS — M79671 Pain in right foot: Secondary | ICD-10-CM | POA: Diagnosis not present

## 2016-02-24 DIAGNOSIS — M1990 Unspecified osteoarthritis, unspecified site: Secondary | ICD-10-CM | POA: Diagnosis not present

## 2016-03-07 DIAGNOSIS — I480 Paroxysmal atrial fibrillation: Secondary | ICD-10-CM | POA: Diagnosis not present

## 2016-03-07 DIAGNOSIS — Z7901 Long term (current) use of anticoagulants: Secondary | ICD-10-CM | POA: Diagnosis not present

## 2016-03-08 DIAGNOSIS — C44319 Basal cell carcinoma of skin of other parts of face: Secondary | ICD-10-CM | POA: Diagnosis not present

## 2016-03-08 DIAGNOSIS — Z85828 Personal history of other malignant neoplasm of skin: Secondary | ICD-10-CM | POA: Diagnosis not present

## 2016-03-15 DIAGNOSIS — Z4802 Encounter for removal of sutures: Secondary | ICD-10-CM | POA: Diagnosis not present

## 2016-03-21 DIAGNOSIS — M109 Gout, unspecified: Secondary | ICD-10-CM | POA: Diagnosis not present

## 2016-03-21 DIAGNOSIS — E790 Hyperuricemia without signs of inflammatory arthritis and tophaceous disease: Secondary | ICD-10-CM | POA: Diagnosis not present

## 2016-03-24 DIAGNOSIS — I480 Paroxysmal atrial fibrillation: Secondary | ICD-10-CM | POA: Diagnosis not present

## 2016-03-24 DIAGNOSIS — Z7901 Long term (current) use of anticoagulants: Secondary | ICD-10-CM | POA: Diagnosis not present

## 2016-03-28 ENCOUNTER — Other Ambulatory Visit: Payer: Self-pay | Admitting: Interventional Cardiology

## 2016-04-07 DIAGNOSIS — I480 Paroxysmal atrial fibrillation: Secondary | ICD-10-CM | POA: Diagnosis not present

## 2016-04-07 DIAGNOSIS — Z7901 Long term (current) use of anticoagulants: Secondary | ICD-10-CM | POA: Diagnosis not present

## 2016-04-25 DIAGNOSIS — H401113 Primary open-angle glaucoma, right eye, severe stage: Secondary | ICD-10-CM | POA: Diagnosis not present

## 2016-04-25 DIAGNOSIS — H401121 Primary open-angle glaucoma, left eye, mild stage: Secondary | ICD-10-CM | POA: Diagnosis not present

## 2016-05-02 ENCOUNTER — Other Ambulatory Visit: Payer: Self-pay | Admitting: Interventional Cardiology

## 2016-05-02 NOTE — Telephone Encounter (Signed)
amLODipine (NORVASC) 2.5 MG tablet  Medication  Date: 12/15/2015 Department: [place] Ordering/Authorizing: Belva Crome, MD  Order Providers   Prescribing Provider Encounter Provider  Belva Crome, MD Candice A Cox, CMA  Medication Detail    Disp Refills Start End   amLODipine (NORVASC) 2.5 MG tablet 90 tablet 1 12/15/2015    Sig - Route: Take 1 tablet (2.5 mg total) by mouth daily. - Oral   E-Prescribing Status: Receipt confirmed by pharmacy (12/15/2015 9:49 AM EDT)   Pharmacy   [facility], [place]

## 2016-05-10 DIAGNOSIS — I480 Paroxysmal atrial fibrillation: Secondary | ICD-10-CM | POA: Diagnosis not present

## 2016-05-10 DIAGNOSIS — L659 Nonscarring hair loss, unspecified: Secondary | ICD-10-CM | POA: Diagnosis not present

## 2016-05-10 DIAGNOSIS — Z7901 Long term (current) use of anticoagulants: Secondary | ICD-10-CM | POA: Diagnosis not present

## 2016-05-19 DIAGNOSIS — I480 Paroxysmal atrial fibrillation: Secondary | ICD-10-CM | POA: Diagnosis not present

## 2016-05-19 DIAGNOSIS — Z7901 Long term (current) use of anticoagulants: Secondary | ICD-10-CM | POA: Diagnosis not present

## 2016-06-02 ENCOUNTER — Other Ambulatory Visit: Payer: Self-pay | Admitting: Interventional Cardiology

## 2016-06-14 DIAGNOSIS — I480 Paroxysmal atrial fibrillation: Secondary | ICD-10-CM | POA: Diagnosis not present

## 2016-06-14 DIAGNOSIS — I10 Essential (primary) hypertension: Secondary | ICD-10-CM | POA: Diagnosis not present

## 2016-06-14 DIAGNOSIS — I6789 Other cerebrovascular disease: Secondary | ICD-10-CM | POA: Diagnosis not present

## 2016-06-14 DIAGNOSIS — G2581 Restless legs syndrome: Secondary | ICD-10-CM | POA: Diagnosis not present

## 2016-06-14 DIAGNOSIS — E790 Hyperuricemia without signs of inflammatory arthritis and tophaceous disease: Secondary | ICD-10-CM | POA: Diagnosis not present

## 2016-06-14 DIAGNOSIS — K30 Functional dyspepsia: Secondary | ICD-10-CM | POA: Diagnosis not present

## 2016-06-14 DIAGNOSIS — Z1389 Encounter for screening for other disorder: Secondary | ICD-10-CM | POA: Diagnosis not present

## 2016-06-14 DIAGNOSIS — N3946 Mixed incontinence: Secondary | ICD-10-CM | POA: Diagnosis not present

## 2016-06-14 DIAGNOSIS — H409 Unspecified glaucoma: Secondary | ICD-10-CM | POA: Diagnosis not present

## 2016-07-12 DIAGNOSIS — Z7901 Long term (current) use of anticoagulants: Secondary | ICD-10-CM | POA: Diagnosis not present

## 2016-08-15 DIAGNOSIS — Z7901 Long term (current) use of anticoagulants: Secondary | ICD-10-CM | POA: Diagnosis not present

## 2016-08-15 DIAGNOSIS — I480 Paroxysmal atrial fibrillation: Secondary | ICD-10-CM | POA: Diagnosis not present

## 2016-08-17 DIAGNOSIS — M109 Gout, unspecified: Secondary | ICD-10-CM | POA: Diagnosis not present

## 2016-08-17 DIAGNOSIS — E790 Hyperuricemia without signs of inflammatory arthritis and tophaceous disease: Secondary | ICD-10-CM | POA: Diagnosis not present

## 2016-08-17 DIAGNOSIS — R609 Edema, unspecified: Secondary | ICD-10-CM | POA: Diagnosis not present

## 2016-08-17 DIAGNOSIS — M79605 Pain in left leg: Secondary | ICD-10-CM | POA: Diagnosis not present

## 2016-08-18 DIAGNOSIS — M79605 Pain in left leg: Secondary | ICD-10-CM | POA: Diagnosis not present

## 2016-08-22 DIAGNOSIS — M25562 Pain in left knee: Secondary | ICD-10-CM | POA: Diagnosis not present

## 2016-09-12 DIAGNOSIS — M25562 Pain in left knee: Secondary | ICD-10-CM | POA: Diagnosis not present

## 2016-09-12 DIAGNOSIS — Z7901 Long term (current) use of anticoagulants: Secondary | ICD-10-CM | POA: Diagnosis not present

## 2016-09-12 DIAGNOSIS — I480 Paroxysmal atrial fibrillation: Secondary | ICD-10-CM | POA: Diagnosis not present

## 2016-09-26 DIAGNOSIS — I480 Paroxysmal atrial fibrillation: Secondary | ICD-10-CM | POA: Diagnosis not present

## 2016-09-26 DIAGNOSIS — Z7901 Long term (current) use of anticoagulants: Secondary | ICD-10-CM | POA: Diagnosis not present

## 2016-10-04 DIAGNOSIS — N3941 Urge incontinence: Secondary | ICD-10-CM | POA: Diagnosis not present

## 2016-10-04 DIAGNOSIS — G2581 Restless legs syndrome: Secondary | ICD-10-CM | POA: Diagnosis not present

## 2016-10-04 DIAGNOSIS — H6980 Other specified disorders of Eustachian tube, unspecified ear: Secondary | ICD-10-CM | POA: Diagnosis not present

## 2016-10-10 DIAGNOSIS — Z7901 Long term (current) use of anticoagulants: Secondary | ICD-10-CM | POA: Diagnosis not present

## 2016-10-10 DIAGNOSIS — I480 Paroxysmal atrial fibrillation: Secondary | ICD-10-CM | POA: Diagnosis not present

## 2016-10-16 DIAGNOSIS — H6122 Impacted cerumen, left ear: Secondary | ICD-10-CM | POA: Diagnosis not present

## 2016-10-16 DIAGNOSIS — H6062 Unspecified chronic otitis externa, left ear: Secondary | ICD-10-CM | POA: Diagnosis not present

## 2016-10-26 DIAGNOSIS — H401121 Primary open-angle glaucoma, left eye, mild stage: Secondary | ICD-10-CM | POA: Diagnosis not present

## 2016-10-26 DIAGNOSIS — Z961 Presence of intraocular lens: Secondary | ICD-10-CM | POA: Diagnosis not present

## 2016-10-26 DIAGNOSIS — H04123 Dry eye syndrome of bilateral lacrimal glands: Secondary | ICD-10-CM | POA: Diagnosis not present

## 2016-10-26 DIAGNOSIS — H401113 Primary open-angle glaucoma, right eye, severe stage: Secondary | ICD-10-CM | POA: Diagnosis not present

## 2016-11-07 DIAGNOSIS — Z7901 Long term (current) use of anticoagulants: Secondary | ICD-10-CM | POA: Diagnosis not present

## 2016-11-07 DIAGNOSIS — I480 Paroxysmal atrial fibrillation: Secondary | ICD-10-CM | POA: Diagnosis not present

## 2016-11-21 DIAGNOSIS — I480 Paroxysmal atrial fibrillation: Secondary | ICD-10-CM | POA: Diagnosis not present

## 2016-11-21 DIAGNOSIS — Z7901 Long term (current) use of anticoagulants: Secondary | ICD-10-CM | POA: Diagnosis not present

## 2016-12-13 DIAGNOSIS — I10 Essential (primary) hypertension: Secondary | ICD-10-CM | POA: Diagnosis not present

## 2016-12-13 DIAGNOSIS — N3946 Mixed incontinence: Secondary | ICD-10-CM | POA: Diagnosis not present

## 2016-12-13 DIAGNOSIS — Z7901 Long term (current) use of anticoagulants: Secondary | ICD-10-CM | POA: Diagnosis not present

## 2016-12-13 DIAGNOSIS — H409 Unspecified glaucoma: Secondary | ICD-10-CM | POA: Diagnosis not present

## 2016-12-13 DIAGNOSIS — G2581 Restless legs syndrome: Secondary | ICD-10-CM | POA: Diagnosis not present

## 2016-12-13 DIAGNOSIS — E790 Hyperuricemia without signs of inflammatory arthritis and tophaceous disease: Secondary | ICD-10-CM | POA: Diagnosis not present

## 2016-12-13 DIAGNOSIS — H6980 Other specified disorders of Eustachian tube, unspecified ear: Secondary | ICD-10-CM | POA: Diagnosis not present

## 2016-12-13 DIAGNOSIS — I480 Paroxysmal atrial fibrillation: Secondary | ICD-10-CM | POA: Diagnosis not present

## 2017-01-10 DIAGNOSIS — Z7901 Long term (current) use of anticoagulants: Secondary | ICD-10-CM | POA: Diagnosis not present

## 2017-02-13 DIAGNOSIS — Z7901 Long term (current) use of anticoagulants: Secondary | ICD-10-CM | POA: Diagnosis not present

## 2017-02-13 DIAGNOSIS — I480 Paroxysmal atrial fibrillation: Secondary | ICD-10-CM | POA: Diagnosis not present

## 2017-02-28 DIAGNOSIS — I480 Paroxysmal atrial fibrillation: Secondary | ICD-10-CM | POA: Diagnosis not present

## 2017-02-28 DIAGNOSIS — Z7901 Long term (current) use of anticoagulants: Secondary | ICD-10-CM | POA: Diagnosis not present

## 2017-03-28 DIAGNOSIS — I480 Paroxysmal atrial fibrillation: Secondary | ICD-10-CM | POA: Diagnosis not present

## 2017-03-28 DIAGNOSIS — Z7901 Long term (current) use of anticoagulants: Secondary | ICD-10-CM | POA: Diagnosis not present

## 2017-03-28 DIAGNOSIS — I10 Essential (primary) hypertension: Secondary | ICD-10-CM | POA: Diagnosis not present

## 2017-04-11 DIAGNOSIS — Z7901 Long term (current) use of anticoagulants: Secondary | ICD-10-CM | POA: Diagnosis not present

## 2017-04-11 DIAGNOSIS — I480 Paroxysmal atrial fibrillation: Secondary | ICD-10-CM | POA: Diagnosis not present

## 2017-04-25 DIAGNOSIS — I480 Paroxysmal atrial fibrillation: Secondary | ICD-10-CM | POA: Diagnosis not present

## 2017-04-25 DIAGNOSIS — Z7901 Long term (current) use of anticoagulants: Secondary | ICD-10-CM | POA: Diagnosis not present

## 2017-05-03 DIAGNOSIS — H401113 Primary open-angle glaucoma, right eye, severe stage: Secondary | ICD-10-CM | POA: Diagnosis not present

## 2017-05-03 DIAGNOSIS — H04123 Dry eye syndrome of bilateral lacrimal glands: Secondary | ICD-10-CM | POA: Diagnosis not present

## 2017-05-03 DIAGNOSIS — H401121 Primary open-angle glaucoma, left eye, mild stage: Secondary | ICD-10-CM | POA: Diagnosis not present

## 2017-05-04 DIAGNOSIS — M25532 Pain in left wrist: Secondary | ICD-10-CM | POA: Diagnosis not present

## 2017-05-04 DIAGNOSIS — M1711 Unilateral primary osteoarthritis, right knee: Secondary | ICD-10-CM | POA: Diagnosis not present

## 2017-05-04 DIAGNOSIS — M1712 Unilateral primary osteoarthritis, left knee: Secondary | ICD-10-CM | POA: Diagnosis not present

## 2017-05-09 DIAGNOSIS — Z7901 Long term (current) use of anticoagulants: Secondary | ICD-10-CM | POA: Diagnosis not present

## 2017-06-05 DIAGNOSIS — M545 Low back pain: Secondary | ICD-10-CM | POA: Diagnosis not present

## 2017-06-05 DIAGNOSIS — M1712 Unilateral primary osteoarthritis, left knee: Secondary | ICD-10-CM | POA: Diagnosis not present

## 2017-06-05 DIAGNOSIS — M5441 Lumbago with sciatica, right side: Secondary | ICD-10-CM | POA: Diagnosis not present

## 2017-06-05 DIAGNOSIS — M1711 Unilateral primary osteoarthritis, right knee: Secondary | ICD-10-CM | POA: Diagnosis not present

## 2017-06-06 DIAGNOSIS — I6789 Other cerebrovascular disease: Secondary | ICD-10-CM | POA: Diagnosis not present

## 2017-06-06 DIAGNOSIS — Z7901 Long term (current) use of anticoagulants: Secondary | ICD-10-CM | POA: Diagnosis not present

## 2017-07-02 DIAGNOSIS — D692 Other nonthrombocytopenic purpura: Secondary | ICD-10-CM | POA: Diagnosis not present

## 2017-07-02 DIAGNOSIS — I480 Paroxysmal atrial fibrillation: Secondary | ICD-10-CM | POA: Diagnosis not present

## 2017-07-02 DIAGNOSIS — G2581 Restless legs syndrome: Secondary | ICD-10-CM | POA: Diagnosis not present

## 2017-07-02 DIAGNOSIS — I10 Essential (primary) hypertension: Secondary | ICD-10-CM | POA: Diagnosis not present

## 2017-07-02 DIAGNOSIS — H409 Unspecified glaucoma: Secondary | ICD-10-CM | POA: Diagnosis not present

## 2017-07-02 DIAGNOSIS — E790 Hyperuricemia without signs of inflammatory arthritis and tophaceous disease: Secondary | ICD-10-CM | POA: Diagnosis not present

## 2017-07-02 DIAGNOSIS — N3946 Mixed incontinence: Secondary | ICD-10-CM | POA: Diagnosis not present

## 2017-07-02 DIAGNOSIS — Z7901 Long term (current) use of anticoagulants: Secondary | ICD-10-CM | POA: Diagnosis not present

## 2017-07-16 DIAGNOSIS — H903 Sensorineural hearing loss, bilateral: Secondary | ICD-10-CM | POA: Insufficient documentation

## 2017-07-16 DIAGNOSIS — H900 Conductive hearing loss, bilateral: Secondary | ICD-10-CM | POA: Diagnosis not present

## 2017-07-16 DIAGNOSIS — H9190 Unspecified hearing loss, unspecified ear: Secondary | ICD-10-CM | POA: Diagnosis not present

## 2017-07-16 DIAGNOSIS — H722X1 Other marginal perforations of tympanic membrane, right ear: Secondary | ICD-10-CM | POA: Diagnosis not present

## 2017-07-27 DIAGNOSIS — Z7901 Long term (current) use of anticoagulants: Secondary | ICD-10-CM | POA: Diagnosis not present

## 2017-07-27 DIAGNOSIS — I480 Paroxysmal atrial fibrillation: Secondary | ICD-10-CM | POA: Diagnosis not present

## 2017-08-23 DIAGNOSIS — Z7901 Long term (current) use of anticoagulants: Secondary | ICD-10-CM | POA: Diagnosis not present

## 2017-09-21 DIAGNOSIS — I480 Paroxysmal atrial fibrillation: Secondary | ICD-10-CM | POA: Diagnosis not present

## 2017-09-21 DIAGNOSIS — Z7901 Long term (current) use of anticoagulants: Secondary | ICD-10-CM | POA: Diagnosis not present

## 2017-09-27 DIAGNOSIS — M79672 Pain in left foot: Secondary | ICD-10-CM | POA: Diagnosis not present

## 2017-09-27 DIAGNOSIS — M109 Gout, unspecified: Secondary | ICD-10-CM | POA: Diagnosis not present

## 2017-10-22 DIAGNOSIS — I480 Paroxysmal atrial fibrillation: Secondary | ICD-10-CM | POA: Diagnosis not present

## 2017-10-22 DIAGNOSIS — Z7901 Long term (current) use of anticoagulants: Secondary | ICD-10-CM | POA: Diagnosis not present

## 2017-11-06 DIAGNOSIS — H401121 Primary open-angle glaucoma, left eye, mild stage: Secondary | ICD-10-CM | POA: Diagnosis not present

## 2017-11-06 DIAGNOSIS — Z961 Presence of intraocular lens: Secondary | ICD-10-CM | POA: Diagnosis not present

## 2017-11-06 DIAGNOSIS — H401113 Primary open-angle glaucoma, right eye, severe stage: Secondary | ICD-10-CM | POA: Diagnosis not present

## 2017-11-06 DIAGNOSIS — H04123 Dry eye syndrome of bilateral lacrimal glands: Secondary | ICD-10-CM | POA: Diagnosis not present

## 2017-11-21 DIAGNOSIS — Z7901 Long term (current) use of anticoagulants: Secondary | ICD-10-CM | POA: Diagnosis not present

## 2017-11-23 DIAGNOSIS — R05 Cough: Secondary | ICD-10-CM | POA: Diagnosis not present

## 2017-11-26 DIAGNOSIS — R05 Cough: Secondary | ICD-10-CM | POA: Diagnosis not present

## 2017-11-26 DIAGNOSIS — H6981 Other specified disorders of Eustachian tube, right ear: Secondary | ICD-10-CM | POA: Diagnosis not present

## 2017-11-26 DIAGNOSIS — M170 Bilateral primary osteoarthritis of knee: Secondary | ICD-10-CM | POA: Diagnosis not present

## 2017-12-24 DIAGNOSIS — Z7901 Long term (current) use of anticoagulants: Secondary | ICD-10-CM | POA: Diagnosis not present

## 2017-12-31 DIAGNOSIS — L720 Epidermal cyst: Secondary | ICD-10-CM | POA: Diagnosis not present

## 2017-12-31 DIAGNOSIS — L570 Actinic keratosis: Secondary | ICD-10-CM | POA: Diagnosis not present

## 2017-12-31 DIAGNOSIS — Z85828 Personal history of other malignant neoplasm of skin: Secondary | ICD-10-CM | POA: Diagnosis not present

## 2018-01-21 DIAGNOSIS — Z7901 Long term (current) use of anticoagulants: Secondary | ICD-10-CM | POA: Diagnosis not present

## 2018-02-21 DIAGNOSIS — Z7901 Long term (current) use of anticoagulants: Secondary | ICD-10-CM | POA: Diagnosis not present

## 2018-03-07 DIAGNOSIS — Z7901 Long term (current) use of anticoagulants: Secondary | ICD-10-CM | POA: Diagnosis not present

## 2018-03-28 DIAGNOSIS — Z7901 Long term (current) use of anticoagulants: Secondary | ICD-10-CM | POA: Diagnosis not present

## 2018-04-10 DIAGNOSIS — Z7901 Long term (current) use of anticoagulants: Secondary | ICD-10-CM | POA: Diagnosis not present

## 2018-05-10 DIAGNOSIS — R300 Dysuria: Secondary | ICD-10-CM | POA: Diagnosis not present

## 2018-05-10 DIAGNOSIS — Z7901 Long term (current) use of anticoagulants: Secondary | ICD-10-CM | POA: Diagnosis not present

## 2018-05-14 DIAGNOSIS — H401121 Primary open-angle glaucoma, left eye, mild stage: Secondary | ICD-10-CM | POA: Diagnosis not present

## 2018-05-14 DIAGNOSIS — H04123 Dry eye syndrome of bilateral lacrimal glands: Secondary | ICD-10-CM | POA: Diagnosis not present

## 2018-05-14 DIAGNOSIS — H401113 Primary open-angle glaucoma, right eye, severe stage: Secondary | ICD-10-CM | POA: Diagnosis not present

## 2018-06-07 DIAGNOSIS — Z7901 Long term (current) use of anticoagulants: Secondary | ICD-10-CM | POA: Diagnosis not present

## 2018-07-05 DIAGNOSIS — Z7901 Long term (current) use of anticoagulants: Secondary | ICD-10-CM | POA: Diagnosis not present

## 2018-08-01 DIAGNOSIS — Z7901 Long term (current) use of anticoagulants: Secondary | ICD-10-CM | POA: Diagnosis not present

## 2018-09-02 DIAGNOSIS — Z7901 Long term (current) use of anticoagulants: Secondary | ICD-10-CM | POA: Diagnosis not present

## 2018-09-13 DIAGNOSIS — Z20828 Contact with and (suspected) exposure to other viral communicable diseases: Secondary | ICD-10-CM | POA: Diagnosis not present

## 2018-10-04 DIAGNOSIS — H409 Unspecified glaucoma: Secondary | ICD-10-CM | POA: Diagnosis not present

## 2018-10-04 DIAGNOSIS — I495 Sick sinus syndrome: Secondary | ICD-10-CM | POA: Diagnosis not present

## 2018-10-04 DIAGNOSIS — Z7901 Long term (current) use of anticoagulants: Secondary | ICD-10-CM | POA: Diagnosis not present

## 2018-10-04 DIAGNOSIS — K30 Functional dyspepsia: Secondary | ICD-10-CM | POA: Diagnosis not present

## 2018-10-04 DIAGNOSIS — I10 Essential (primary) hypertension: Secondary | ICD-10-CM | POA: Diagnosis not present

## 2018-10-04 DIAGNOSIS — I480 Paroxysmal atrial fibrillation: Secondary | ICD-10-CM | POA: Diagnosis not present

## 2018-10-04 DIAGNOSIS — N3946 Mixed incontinence: Secondary | ICD-10-CM | POA: Diagnosis not present

## 2018-10-04 DIAGNOSIS — Z79899 Other long term (current) drug therapy: Secondary | ICD-10-CM | POA: Diagnosis not present

## 2018-10-04 DIAGNOSIS — G2581 Restless legs syndrome: Secondary | ICD-10-CM | POA: Diagnosis not present

## 2018-11-01 DIAGNOSIS — Z7901 Long term (current) use of anticoagulants: Secondary | ICD-10-CM | POA: Diagnosis not present

## 2018-11-04 DIAGNOSIS — H40053 Ocular hypertension, bilateral: Secondary | ICD-10-CM | POA: Diagnosis not present

## 2018-11-04 DIAGNOSIS — H04123 Dry eye syndrome of bilateral lacrimal glands: Secondary | ICD-10-CM | POA: Diagnosis not present

## 2018-11-04 DIAGNOSIS — H401113 Primary open-angle glaucoma, right eye, severe stage: Secondary | ICD-10-CM | POA: Diagnosis not present

## 2018-11-04 DIAGNOSIS — H401121 Primary open-angle glaucoma, left eye, mild stage: Secondary | ICD-10-CM | POA: Diagnosis not present

## 2018-12-02 DIAGNOSIS — Z7901 Long term (current) use of anticoagulants: Secondary | ICD-10-CM | POA: Diagnosis not present

## 2018-12-02 DIAGNOSIS — I480 Paroxysmal atrial fibrillation: Secondary | ICD-10-CM | POA: Diagnosis not present

## 2018-12-04 DIAGNOSIS — H35033 Hypertensive retinopathy, bilateral: Secondary | ICD-10-CM | POA: Diagnosis not present

## 2018-12-04 DIAGNOSIS — H04123 Dry eye syndrome of bilateral lacrimal glands: Secondary | ICD-10-CM | POA: Diagnosis not present

## 2018-12-04 DIAGNOSIS — H401121 Primary open-angle glaucoma, left eye, mild stage: Secondary | ICD-10-CM | POA: Diagnosis not present

## 2018-12-04 DIAGNOSIS — H401113 Primary open-angle glaucoma, right eye, severe stage: Secondary | ICD-10-CM | POA: Diagnosis not present

## 2018-12-09 DIAGNOSIS — I10 Essential (primary) hypertension: Secondary | ICD-10-CM | POA: Diagnosis not present

## 2018-12-09 DIAGNOSIS — R05 Cough: Secondary | ICD-10-CM | POA: Diagnosis not present

## 2018-12-10 DIAGNOSIS — R05 Cough: Secondary | ICD-10-CM | POA: Diagnosis not present

## 2018-12-12 DIAGNOSIS — R05 Cough: Secondary | ICD-10-CM | POA: Diagnosis not present

## 2018-12-12 DIAGNOSIS — I10 Essential (primary) hypertension: Secondary | ICD-10-CM | POA: Diagnosis not present

## 2018-12-16 DIAGNOSIS — H401111 Primary open-angle glaucoma, right eye, mild stage: Secondary | ICD-10-CM | POA: Diagnosis not present

## 2018-12-18 DIAGNOSIS — Z20828 Contact with and (suspected) exposure to other viral communicable diseases: Secondary | ICD-10-CM | POA: Diagnosis not present

## 2018-12-18 DIAGNOSIS — Z6831 Body mass index (BMI) 31.0-31.9, adult: Secondary | ICD-10-CM | POA: Diagnosis not present

## 2018-12-19 DIAGNOSIS — Z20828 Contact with and (suspected) exposure to other viral communicable diseases: Secondary | ICD-10-CM | POA: Diagnosis not present

## 2018-12-30 DIAGNOSIS — H401121 Primary open-angle glaucoma, left eye, mild stage: Secondary | ICD-10-CM | POA: Diagnosis not present

## 2019-01-06 DIAGNOSIS — Z7901 Long term (current) use of anticoagulants: Secondary | ICD-10-CM | POA: Diagnosis not present

## 2019-01-06 DIAGNOSIS — I10 Essential (primary) hypertension: Secondary | ICD-10-CM | POA: Diagnosis not present

## 2019-02-03 DIAGNOSIS — Z7901 Long term (current) use of anticoagulants: Secondary | ICD-10-CM | POA: Diagnosis not present

## 2019-02-17 DIAGNOSIS — H0102A Squamous blepharitis right eye, upper and lower eyelids: Secondary | ICD-10-CM | POA: Diagnosis not present

## 2019-02-17 DIAGNOSIS — H401121 Primary open-angle glaucoma, left eye, mild stage: Secondary | ICD-10-CM | POA: Diagnosis not present

## 2019-02-17 DIAGNOSIS — H401113 Primary open-angle glaucoma, right eye, severe stage: Secondary | ICD-10-CM | POA: Diagnosis not present

## 2019-02-17 DIAGNOSIS — H0102B Squamous blepharitis left eye, upper and lower eyelids: Secondary | ICD-10-CM | POA: Diagnosis not present

## 2019-03-04 DIAGNOSIS — Z7901 Long term (current) use of anticoagulants: Secondary | ICD-10-CM | POA: Diagnosis not present

## 2019-04-01 DIAGNOSIS — Z7901 Long term (current) use of anticoagulants: Secondary | ICD-10-CM | POA: Diagnosis not present

## 2019-04-07 DIAGNOSIS — Z79899 Other long term (current) drug therapy: Secondary | ICD-10-CM | POA: Diagnosis not present

## 2019-04-07 DIAGNOSIS — I10 Essential (primary) hypertension: Secondary | ICD-10-CM | POA: Diagnosis not present

## 2019-04-07 DIAGNOSIS — I480 Paroxysmal atrial fibrillation: Secondary | ICD-10-CM | POA: Diagnosis not present

## 2019-04-07 DIAGNOSIS — N3946 Mixed incontinence: Secondary | ICD-10-CM | POA: Diagnosis not present

## 2019-04-07 DIAGNOSIS — G2581 Restless legs syndrome: Secondary | ICD-10-CM | POA: Diagnosis not present

## 2019-04-15 DIAGNOSIS — Z7901 Long term (current) use of anticoagulants: Secondary | ICD-10-CM | POA: Diagnosis not present

## 2019-04-15 DIAGNOSIS — I10 Essential (primary) hypertension: Secondary | ICD-10-CM | POA: Diagnosis not present

## 2019-04-15 DIAGNOSIS — Z79899 Other long term (current) drug therapy: Secondary | ICD-10-CM | POA: Diagnosis not present

## 2019-05-13 DIAGNOSIS — I10 Essential (primary) hypertension: Secondary | ICD-10-CM | POA: Diagnosis not present

## 2019-05-13 DIAGNOSIS — Z7901 Long term (current) use of anticoagulants: Secondary | ICD-10-CM | POA: Diagnosis not present

## 2019-05-20 DIAGNOSIS — I10 Essential (primary) hypertension: Secondary | ICD-10-CM | POA: Diagnosis not present

## 2019-05-20 DIAGNOSIS — Z7901 Long term (current) use of anticoagulants: Secondary | ICD-10-CM | POA: Diagnosis not present

## 2019-06-06 DIAGNOSIS — Z7901 Long term (current) use of anticoagulants: Secondary | ICD-10-CM | POA: Diagnosis not present

## 2019-06-06 DIAGNOSIS — N9089 Other specified noninflammatory disorders of vulva and perineum: Secondary | ICD-10-CM | POA: Diagnosis not present

## 2019-06-19 DIAGNOSIS — I480 Paroxysmal atrial fibrillation: Secondary | ICD-10-CM | POA: Diagnosis not present

## 2019-06-19 DIAGNOSIS — I6789 Other cerebrovascular disease: Secondary | ICD-10-CM | POA: Diagnosis not present

## 2019-06-19 DIAGNOSIS — H4089 Other specified glaucoma: Secondary | ICD-10-CM | POA: Diagnosis not present

## 2019-06-19 DIAGNOSIS — H409 Unspecified glaucoma: Secondary | ICD-10-CM | POA: Diagnosis not present

## 2019-06-19 DIAGNOSIS — M1990 Unspecified osteoarthritis, unspecified site: Secondary | ICD-10-CM | POA: Diagnosis not present

## 2019-06-19 DIAGNOSIS — I10 Essential (primary) hypertension: Secondary | ICD-10-CM | POA: Diagnosis not present

## 2019-06-19 DIAGNOSIS — M170 Bilateral primary osteoarthritis of knee: Secondary | ICD-10-CM | POA: Diagnosis not present

## 2019-06-25 DIAGNOSIS — H35033 Hypertensive retinopathy, bilateral: Secondary | ICD-10-CM | POA: Diagnosis not present

## 2019-06-25 DIAGNOSIS — H26492 Other secondary cataract, left eye: Secondary | ICD-10-CM | POA: Diagnosis not present

## 2019-06-25 DIAGNOSIS — H401113 Primary open-angle glaucoma, right eye, severe stage: Secondary | ICD-10-CM | POA: Diagnosis not present

## 2019-06-25 DIAGNOSIS — H26493 Other secondary cataract, bilateral: Secondary | ICD-10-CM | POA: Diagnosis not present

## 2019-06-25 DIAGNOSIS — H401121 Primary open-angle glaucoma, left eye, mild stage: Secondary | ICD-10-CM | POA: Diagnosis not present

## 2019-07-03 DIAGNOSIS — Z7901 Long term (current) use of anticoagulants: Secondary | ICD-10-CM | POA: Diagnosis not present

## 2019-07-03 DIAGNOSIS — N1832 Chronic kidney disease, stage 3b: Secondary | ICD-10-CM | POA: Diagnosis not present

## 2019-07-03 DIAGNOSIS — Z79899 Other long term (current) drug therapy: Secondary | ICD-10-CM | POA: Diagnosis not present

## 2019-07-17 DIAGNOSIS — H409 Unspecified glaucoma: Secondary | ICD-10-CM | POA: Diagnosis not present

## 2019-07-17 DIAGNOSIS — I480 Paroxysmal atrial fibrillation: Secondary | ICD-10-CM | POA: Diagnosis not present

## 2019-07-17 DIAGNOSIS — H4089 Other specified glaucoma: Secondary | ICD-10-CM | POA: Diagnosis not present

## 2019-07-17 DIAGNOSIS — M170 Bilateral primary osteoarthritis of knee: Secondary | ICD-10-CM | POA: Diagnosis not present

## 2019-07-17 DIAGNOSIS — M1990 Unspecified osteoarthritis, unspecified site: Secondary | ICD-10-CM | POA: Diagnosis not present

## 2019-07-17 DIAGNOSIS — I6789 Other cerebrovascular disease: Secondary | ICD-10-CM | POA: Diagnosis not present

## 2019-07-17 DIAGNOSIS — I10 Essential (primary) hypertension: Secondary | ICD-10-CM | POA: Diagnosis not present

## 2019-07-31 DIAGNOSIS — I10 Essential (primary) hypertension: Secondary | ICD-10-CM | POA: Diagnosis not present

## 2019-07-31 DIAGNOSIS — I480 Paroxysmal atrial fibrillation: Secondary | ICD-10-CM | POA: Diagnosis not present

## 2019-08-13 DIAGNOSIS — M1990 Unspecified osteoarthritis, unspecified site: Secondary | ICD-10-CM | POA: Diagnosis not present

## 2019-08-13 DIAGNOSIS — I480 Paroxysmal atrial fibrillation: Secondary | ICD-10-CM | POA: Diagnosis not present

## 2019-08-13 DIAGNOSIS — I10 Essential (primary) hypertension: Secondary | ICD-10-CM | POA: Diagnosis not present

## 2019-08-13 DIAGNOSIS — I6789 Other cerebrovascular disease: Secondary | ICD-10-CM | POA: Diagnosis not present

## 2019-08-13 DIAGNOSIS — H4089 Other specified glaucoma: Secondary | ICD-10-CM | POA: Diagnosis not present

## 2019-08-13 DIAGNOSIS — H409 Unspecified glaucoma: Secondary | ICD-10-CM | POA: Diagnosis not present

## 2019-08-13 DIAGNOSIS — M170 Bilateral primary osteoarthritis of knee: Secondary | ICD-10-CM | POA: Diagnosis not present

## 2019-08-14 DIAGNOSIS — I10 Essential (primary) hypertension: Secondary | ICD-10-CM | POA: Diagnosis not present

## 2019-08-28 DIAGNOSIS — Z7901 Long term (current) use of anticoagulants: Secondary | ICD-10-CM | POA: Diagnosis not present

## 2019-09-22 DIAGNOSIS — I480 Paroxysmal atrial fibrillation: Secondary | ICD-10-CM | POA: Diagnosis not present

## 2019-09-22 DIAGNOSIS — H4089 Other specified glaucoma: Secondary | ICD-10-CM | POA: Diagnosis not present

## 2019-09-22 DIAGNOSIS — I6789 Other cerebrovascular disease: Secondary | ICD-10-CM | POA: Diagnosis not present

## 2019-09-22 DIAGNOSIS — H409 Unspecified glaucoma: Secondary | ICD-10-CM | POA: Diagnosis not present

## 2019-09-22 DIAGNOSIS — M1990 Unspecified osteoarthritis, unspecified site: Secondary | ICD-10-CM | POA: Diagnosis not present

## 2019-09-22 DIAGNOSIS — M170 Bilateral primary osteoarthritis of knee: Secondary | ICD-10-CM | POA: Diagnosis not present

## 2019-09-25 DIAGNOSIS — Z7901 Long term (current) use of anticoagulants: Secondary | ICD-10-CM | POA: Diagnosis not present

## 2019-10-28 DIAGNOSIS — Z7901 Long term (current) use of anticoagulants: Secondary | ICD-10-CM | POA: Diagnosis not present

## 2019-10-28 DIAGNOSIS — I10 Essential (primary) hypertension: Secondary | ICD-10-CM | POA: Diagnosis not present

## 2019-10-28 DIAGNOSIS — G2581 Restless legs syndrome: Secondary | ICD-10-CM | POA: Diagnosis not present

## 2019-10-28 DIAGNOSIS — N3946 Mixed incontinence: Secondary | ICD-10-CM | POA: Diagnosis not present

## 2019-10-28 DIAGNOSIS — Z79899 Other long term (current) drug therapy: Secondary | ICD-10-CM | POA: Diagnosis not present

## 2019-10-28 DIAGNOSIS — R809 Proteinuria, unspecified: Secondary | ICD-10-CM | POA: Diagnosis not present

## 2019-10-28 DIAGNOSIS — R05 Cough: Secondary | ICD-10-CM | POA: Diagnosis not present

## 2019-10-28 DIAGNOSIS — H409 Unspecified glaucoma: Secondary | ICD-10-CM | POA: Diagnosis not present

## 2019-11-27 DIAGNOSIS — Z7901 Long term (current) use of anticoagulants: Secondary | ICD-10-CM | POA: Diagnosis not present

## 2019-12-23 DIAGNOSIS — H26491 Other secondary cataract, right eye: Secondary | ICD-10-CM | POA: Diagnosis not present

## 2019-12-23 DIAGNOSIS — H3562 Retinal hemorrhage, left eye: Secondary | ICD-10-CM | POA: Diagnosis not present

## 2019-12-23 DIAGNOSIS — H524 Presbyopia: Secondary | ICD-10-CM | POA: Diagnosis not present

## 2019-12-23 DIAGNOSIS — H401113 Primary open-angle glaucoma, right eye, severe stage: Secondary | ICD-10-CM | POA: Diagnosis not present

## 2019-12-23 DIAGNOSIS — H401121 Primary open-angle glaucoma, left eye, mild stage: Secondary | ICD-10-CM | POA: Diagnosis not present

## 2019-12-29 DIAGNOSIS — Z7901 Long term (current) use of anticoagulants: Secondary | ICD-10-CM | POA: Diagnosis not present

## 2019-12-29 DIAGNOSIS — I480 Paroxysmal atrial fibrillation: Secondary | ICD-10-CM | POA: Diagnosis not present

## 2020-01-26 DIAGNOSIS — Z7901 Long term (current) use of anticoagulants: Secondary | ICD-10-CM | POA: Diagnosis not present

## 2020-03-04 DIAGNOSIS — I480 Paroxysmal atrial fibrillation: Secondary | ICD-10-CM | POA: Diagnosis not present

## 2020-03-04 DIAGNOSIS — I10 Essential (primary) hypertension: Secondary | ICD-10-CM | POA: Diagnosis not present

## 2020-03-04 DIAGNOSIS — M1990 Unspecified osteoarthritis, unspecified site: Secondary | ICD-10-CM | POA: Diagnosis not present

## 2020-03-04 DIAGNOSIS — H4089 Other specified glaucoma: Secondary | ICD-10-CM | POA: Diagnosis not present

## 2020-03-04 DIAGNOSIS — M170 Bilateral primary osteoarthritis of knee: Secondary | ICD-10-CM | POA: Diagnosis not present

## 2020-03-04 DIAGNOSIS — N3941 Urge incontinence: Secondary | ICD-10-CM | POA: Diagnosis not present

## 2020-03-04 DIAGNOSIS — I6789 Other cerebrovascular disease: Secondary | ICD-10-CM | POA: Diagnosis not present

## 2020-03-04 DIAGNOSIS — G2581 Restless legs syndrome: Secondary | ICD-10-CM | POA: Diagnosis not present

## 2020-03-04 DIAGNOSIS — Z7901 Long term (current) use of anticoagulants: Secondary | ICD-10-CM | POA: Diagnosis not present

## 2020-03-23 DIAGNOSIS — K5900 Constipation, unspecified: Secondary | ICD-10-CM | POA: Diagnosis not present

## 2020-03-23 DIAGNOSIS — M545 Low back pain, unspecified: Secondary | ICD-10-CM | POA: Diagnosis not present

## 2020-03-23 DIAGNOSIS — R109 Unspecified abdominal pain: Secondary | ICD-10-CM | POA: Diagnosis not present

## 2020-03-27 ENCOUNTER — Encounter (HOSPITAL_COMMUNITY): Payer: Self-pay

## 2020-03-27 ENCOUNTER — Emergency Department (HOSPITAL_COMMUNITY): Payer: Medicare HMO

## 2020-03-27 ENCOUNTER — Inpatient Hospital Stay (HOSPITAL_COMMUNITY)
Admission: EM | Admit: 2020-03-27 | Discharge: 2020-03-30 | DRG: 641 | Disposition: A | Discharge status: Skilled Nursing Facility | Payer: Medicare HMO | Attending: Student | Admitting: Student

## 2020-03-27 ENCOUNTER — Other Ambulatory Visit: Payer: Self-pay

## 2020-03-27 DIAGNOSIS — Z7901 Long term (current) use of anticoagulants: Secondary | ICD-10-CM | POA: Diagnosis not present

## 2020-03-27 DIAGNOSIS — S22080A Wedge compression fracture of T11-T12 vertebra, initial encounter for closed fracture: Secondary | ICD-10-CM | POA: Diagnosis not present

## 2020-03-27 DIAGNOSIS — R5381 Other malaise: Secondary | ICD-10-CM | POA: Diagnosis present

## 2020-03-27 DIAGNOSIS — K551 Chronic vascular disorders of intestine: Secondary | ICD-10-CM | POA: Diagnosis not present

## 2020-03-27 DIAGNOSIS — M419 Scoliosis, unspecified: Secondary | ICD-10-CM | POA: Diagnosis present

## 2020-03-27 DIAGNOSIS — N281 Cyst of kidney, acquired: Secondary | ICD-10-CM | POA: Diagnosis not present

## 2020-03-27 DIAGNOSIS — I10 Essential (primary) hypertension: Secondary | ICD-10-CM | POA: Diagnosis not present

## 2020-03-27 DIAGNOSIS — I480 Paroxysmal atrial fibrillation: Secondary | ICD-10-CM | POA: Diagnosis not present

## 2020-03-27 DIAGNOSIS — M6281 Muscle weakness (generalized): Secondary | ICD-10-CM | POA: Diagnosis not present

## 2020-03-27 DIAGNOSIS — I771 Stricture of artery: Secondary | ICD-10-CM

## 2020-03-27 DIAGNOSIS — Z20822 Contact with and (suspected) exposure to covid-19: Secondary | ICD-10-CM | POA: Diagnosis not present

## 2020-03-27 DIAGNOSIS — Z79899 Other long term (current) drug therapy: Secondary | ICD-10-CM | POA: Diagnosis not present

## 2020-03-27 DIAGNOSIS — I484 Atypical atrial flutter: Secondary | ICD-10-CM | POA: Diagnosis not present

## 2020-03-27 DIAGNOSIS — K7689 Other specified diseases of liver: Secondary | ICD-10-CM | POA: Diagnosis not present

## 2020-03-27 DIAGNOSIS — N200 Calculus of kidney: Secondary | ICD-10-CM | POA: Diagnosis not present

## 2020-03-27 DIAGNOSIS — E876 Hypokalemia: Principal | ICD-10-CM | POA: Diagnosis present

## 2020-03-27 DIAGNOSIS — R197 Diarrhea, unspecified: Secondary | ICD-10-CM | POA: Diagnosis present

## 2020-03-27 DIAGNOSIS — S22080D Wedge compression fracture of T11-T12 vertebra, subsequent encounter for fracture with routine healing: Secondary | ICD-10-CM | POA: Diagnosis not present

## 2020-03-27 DIAGNOSIS — M2550 Pain in unspecified joint: Secondary | ICD-10-CM | POA: Diagnosis not present

## 2020-03-27 DIAGNOSIS — K5900 Constipation, unspecified: Secondary | ICD-10-CM | POA: Diagnosis not present

## 2020-03-27 DIAGNOSIS — I4892 Unspecified atrial flutter: Secondary | ICD-10-CM | POA: Diagnosis present

## 2020-03-27 DIAGNOSIS — I4819 Other persistent atrial fibrillation: Secondary | ICD-10-CM | POA: Diagnosis not present

## 2020-03-27 DIAGNOSIS — R630 Anorexia: Secondary | ICD-10-CM | POA: Diagnosis present

## 2020-03-27 DIAGNOSIS — Z885 Allergy status to narcotic agent status: Secondary | ICD-10-CM | POA: Diagnosis not present

## 2020-03-27 DIAGNOSIS — E785 Hyperlipidemia, unspecified: Secondary | ICD-10-CM | POA: Diagnosis present

## 2020-03-27 DIAGNOSIS — R52 Pain, unspecified: Secondary | ICD-10-CM | POA: Diagnosis not present

## 2020-03-27 DIAGNOSIS — M545 Low back pain, unspecified: Secondary | ICD-10-CM | POA: Diagnosis not present

## 2020-03-27 DIAGNOSIS — Z888 Allergy status to other drugs, medicaments and biological substances status: Secondary | ICD-10-CM

## 2020-03-27 DIAGNOSIS — M4854XA Collapsed vertebra, not elsewhere classified, thoracic region, initial encounter for fracture: Secondary | ICD-10-CM | POA: Diagnosis not present

## 2020-03-27 DIAGNOSIS — Z90711 Acquired absence of uterus with remaining cervical stump: Secondary | ICD-10-CM

## 2020-03-27 DIAGNOSIS — M549 Dorsalgia, unspecified: Secondary | ICD-10-CM | POA: Diagnosis not present

## 2020-03-27 DIAGNOSIS — R0902 Hypoxemia: Secondary | ICD-10-CM | POA: Diagnosis not present

## 2020-03-27 DIAGNOSIS — I774 Celiac artery compression syndrome: Secondary | ICD-10-CM

## 2020-03-27 DIAGNOSIS — Z7401 Bed confinement status: Secondary | ICD-10-CM | POA: Diagnosis not present

## 2020-03-27 LAB — CBC WITH DIFFERENTIAL/PLATELET
Abs Immature Granulocytes: 0.04 10*3/uL (ref 0.00–0.07)
Basophils Absolute: 0.1 10*3/uL (ref 0.0–0.1)
Basophils Relative: 1 %
Eosinophils Absolute: 0.1 10*3/uL (ref 0.0–0.5)
Eosinophils Relative: 1 %
HCT: 45 % (ref 36.0–46.0)
Hemoglobin: 15 g/dL (ref 12.0–15.0)
Immature Granulocytes: 0 %
Lymphocytes Relative: 8 %
Lymphs Abs: 0.8 10*3/uL (ref 0.7–4.0)
MCH: 29.9 pg (ref 26.0–34.0)
MCHC: 33.3 g/dL (ref 30.0–36.0)
MCV: 89.6 fL (ref 80.0–100.0)
Monocytes Absolute: 0.9 10*3/uL (ref 0.1–1.0)
Monocytes Relative: 9 %
Neutro Abs: 8.6 10*3/uL — ABNORMAL HIGH (ref 1.7–7.7)
Neutrophils Relative %: 81 %
Platelets: 225 10*3/uL (ref 150–400)
RBC: 5.02 MIL/uL (ref 3.87–5.11)
RDW: 14.1 % (ref 11.5–15.5)
WBC: 10.4 10*3/uL (ref 4.0–10.5)
nRBC: 0 % (ref 0.0–0.2)

## 2020-03-27 LAB — RENAL FUNCTION PANEL
Albumin: 3.2 g/dL — ABNORMAL LOW (ref 3.5–5.0)
Anion gap: 9 (ref 5–15)
BUN: 14 mg/dL (ref 8–23)
CO2: 25 mmol/L (ref 22–32)
Calcium: 7.9 mg/dL — ABNORMAL LOW (ref 8.9–10.3)
Chloride: 102 mmol/L (ref 98–111)
Creatinine, Ser: 0.75 mg/dL (ref 0.44–1.00)
GFR, Estimated: >60 mL/min (ref >60)
Glucose, Bld: 141 mg/dL — ABNORMAL HIGH (ref 70–99)
Phosphorus: 1.9 mg/dL — ABNORMAL LOW (ref 2.5–4.6)
Potassium: 3.6 mmol/L (ref 3.5–5.1)
Sodium: 136 mmol/L (ref 135–145)

## 2020-03-27 LAB — URINALYSIS, ROUTINE W REFLEX MICROSCOPIC
Bilirubin Urine: NEGATIVE
Glucose, UA: NEGATIVE mg/dL
Hgb urine dipstick: NEGATIVE
Ketones, ur: 5 mg/dL — AB
Leukocytes,Ua: NEGATIVE
Nitrite: NEGATIVE
Protein, ur: 100 mg/dL — AB
Specific Gravity, Urine: 1.008 (ref 1.005–1.030)
pH: 7 (ref 5.0–8.0)

## 2020-03-27 LAB — COMPREHENSIVE METABOLIC PANEL
ALT: 11 U/L (ref 0–44)
AST: 15 U/L (ref 15–41)
Albumin: 2.8 g/dL — ABNORMAL LOW (ref 3.5–5.0)
Alkaline Phosphatase: 37 U/L — ABNORMAL LOW (ref 38–126)
Anion gap: 8 (ref 5–15)
BUN: 12 mg/dL (ref 8–23)
CO2: 25 mmol/L (ref 22–32)
Calcium: 6.2 mg/dL — CL (ref 8.9–10.3)
Chloride: 106 mmol/L (ref 98–111)
Creatinine, Ser: 0.45 mg/dL (ref 0.44–1.00)
GFR, Estimated: >60 mL/min (ref >60)
Glucose, Bld: 113 mg/dL — ABNORMAL HIGH (ref 70–99)
Potassium: 2 mmol/L — CL (ref 3.5–5.1)
Sodium: 139 mmol/L (ref 135–145)
Total Bilirubin: 0.7 mg/dL (ref 0.3–1.2)
Total Protein: 4.9 g/dL — ABNORMAL LOW (ref 6.5–8.1)

## 2020-03-27 LAB — RESP PANEL BY RT-PCR (FLU A&B, COVID) ARPGX2
Influenza A by PCR: NEGATIVE
Influenza B by PCR: NEGATIVE
SARS Coronavirus 2 by RT PCR: NEGATIVE

## 2020-03-27 LAB — LACTIC ACID, PLASMA: Lactic Acid, Venous: 1.5 mmol/L (ref 0.5–1.9)

## 2020-03-27 LAB — PROTIME-INR
INR: 3.1 — ABNORMAL HIGH (ref 0.8–1.2)
Prothrombin Time: 31.1 seconds — ABNORMAL HIGH (ref 11.4–15.2)

## 2020-03-27 LAB — MAGNESIUM: Magnesium: 1.5 mg/dL — ABNORMAL LOW (ref 1.7–2.4)

## 2020-03-27 LAB — LIPASE, BLOOD: Lipase: 22 U/L (ref 11–51)

## 2020-03-27 MED ORDER — MAGNESIUM SULFATE 50 % IJ SOLN
1.0000 g | Freq: Once | INTRAMUSCULAR | Status: DC
Start: 2020-03-27 — End: 2020-03-27

## 2020-03-27 MED ORDER — SODIUM CHLORIDE (PF) 0.9 % IJ SOLN
INTRAMUSCULAR | Status: AC
  Filled 2020-03-27: qty 50

## 2020-03-27 MED ORDER — ONDANSETRON HCL 4 MG/2ML IJ SOLN: 4.0000 mg | Freq: Four times a day (QID) | INTRAMUSCULAR | Status: DC | PRN

## 2020-03-27 MED ORDER — KETOROLAC TROMETHAMINE 10 MG PO TABS
10.0000 mg | ORAL_TABLET | Freq: Three times a day (TID) | ORAL | Status: DC | PRN
  Administered 2020-03-27 – 2020-03-28 (×2): 10 mg via ORAL
  Filled 2020-03-27 (×4): qty 1

## 2020-03-27 MED ORDER — ONDANSETRON HCL 4 MG PO TABS: 4.0000 mg | ORAL_TABLET | Freq: Four times a day (QID) | ORAL | Status: DC | PRN

## 2020-03-27 MED ORDER — IOHEXOL 300 MG/ML  SOLN
100.0000 mL | Freq: Once | INTRAMUSCULAR | Status: AC | PRN
  Administered 2020-03-27: 100 mL via INTRAVENOUS

## 2020-03-27 MED ORDER — AMLODIPINE BESYLATE 5 MG PO TABS: 5.0000 mg | ORAL_TABLET | Freq: Every day | ORAL | Status: DC

## 2020-03-27 MED ORDER — MAGNESIUM SULFATE 2 GM/50ML IV SOLN
2.0000 g | Freq: Once | INTRAVENOUS | Status: AC
  Administered 2020-03-27: 2 g via INTRAVENOUS
  Filled 2020-03-27: qty 50

## 2020-03-27 MED ORDER — MAGNESIUM OXIDE 400 (241.3 MG) MG PO TABS: 800.0000 mg | ORAL_TABLET | Freq: Once | ORAL | Status: DC

## 2020-03-27 MED ORDER — LOSARTAN POTASSIUM 50 MG PO TABS
50.0000 mg | ORAL_TABLET | Freq: Every day | ORAL | Status: DC
  Administered 2020-03-28 – 2020-03-29 (×2): 50 mg via ORAL
  Filled 2020-03-27 (×2): qty 1

## 2020-03-27 MED ORDER — WARFARIN SODIUM 1 MG PO TABS
1.5000 mg | ORAL_TABLET | ORAL | Status: DC
  Administered 2020-03-29: 1.5 mg via ORAL
  Filled 2020-03-27: qty 1

## 2020-03-27 MED ORDER — ROPINIROLE HCL 0.25 MG PO TABS
0.2500 mg | ORAL_TABLET | Freq: Three times a day (TID) | ORAL | Status: DC | PRN
  Administered 2020-03-28 – 2020-03-30 (×3): 0.25 mg via ORAL
  Filled 2020-03-27 (×4): qty 1

## 2020-03-27 MED ORDER — POTASSIUM CHLORIDE 10 MEQ/100ML IV SOLN
10.0000 meq | Freq: Once | INTRAVENOUS | Status: AC
  Administered 2020-03-27: 10 meq via INTRAVENOUS

## 2020-03-27 MED ORDER — CALCIUM GLUCONATE-NACL 1-0.675 GM/50ML-% IV SOLN
1.0000 g | Freq: Once | INTRAVENOUS | Status: AC
  Administered 2020-03-27: 1000 mg via INTRAVENOUS
  Filled 2020-03-27: qty 50

## 2020-03-27 MED ORDER — POTASSIUM CHLORIDE 10 MEQ/100ML IV SOLN
10.0000 meq | INTRAVENOUS | Status: AC
  Administered 2020-03-27 (×4): 10 meq via INTRAVENOUS
  Filled 2020-03-27 (×4): qty 100

## 2020-03-27 MED ORDER — LATANOPROST 0.005 % OP SOLN
1.0000 [drp] | Freq: Every day | OPHTHALMIC | Status: DC
  Administered 2020-03-27 – 2020-03-30 (×4): 1 [drp] via OPHTHALMIC
  Filled 2020-03-27: qty 2.5

## 2020-03-27 MED ORDER — DORZOLAMIDE HCL 2 % OP SOLN
1.0000 [drp] | Freq: Two times a day (BID) | OPHTHALMIC | Status: DC
  Administered 2020-03-27 – 2020-03-30 (×7): 1 [drp] via OPHTHALMIC
  Filled 2020-03-27: qty 10

## 2020-03-27 MED ORDER — POTASSIUM CHLORIDE 10 MEQ/100ML IV SOLN
INTRAVENOUS | Status: AC
  Administered 2020-03-27: 10 meq via INTRAVENOUS
  Filled 2020-03-27: qty 100

## 2020-03-27 MED ORDER — METHOCARBAMOL 1000 MG/10ML IJ SOLN
500.0000 mg | Freq: Four times a day (QID) | INTRAVENOUS | Status: DC | PRN
  Filled 2020-03-27: qty 5

## 2020-03-27 MED ORDER — WARFARIN - PHYSICIAN DOSING INPATIENT: Freq: Every day | Status: DC

## 2020-03-27 MED ORDER — POTASSIUM CHLORIDE CRYS ER 20 MEQ PO TBCR: 40.0000 meq | EXTENDED_RELEASE_TABLET | Freq: Two times a day (BID) | ORAL | Status: DC

## 2020-03-27 MED ORDER — POTASSIUM CHLORIDE 10 MEQ/100ML IV SOLN
10.0000 meq | Freq: Once | INTRAVENOUS | Status: AC
  Filled 2020-03-27: qty 100

## 2020-03-27 MED ORDER — WARFARIN SODIUM 1 MG PO TABS
1.0000 mg | ORAL_TABLET | ORAL | Status: DC
  Administered 2020-03-28 – 2020-03-30 (×2): 1 mg via ORAL
  Filled 2020-03-27 (×2): qty 1

## 2020-03-27 MED ORDER — TRAMADOL HCL 50 MG PO TABS
50.0000 mg | ORAL_TABLET | Freq: Three times a day (TID) | ORAL | Status: DC | PRN
  Administered 2020-03-27 – 2020-03-28 (×2): 50 mg via ORAL
  Filled 2020-03-27 (×2): qty 1

## 2020-03-27 MED ORDER — SODIUM CHLORIDE 0.9 % IV BOLUS
500.0000 mL | Freq: Once | INTRAVENOUS | Status: AC
  Administered 2020-03-27: 500 mL via INTRAVENOUS

## 2020-03-27 NOTE — ED Provider Notes (Signed)
[facility] DEPT Provider Note   CSN: [id number] Arrival date & time: 03/27/20  5277     History Chief Complaint  Patient presents with  . Dizziness  . Diarrhea  . Back Pain    Danielle Thomas is a 86 y.o. female history of hypertension, hyperlipidemia, A. fib on warfarin.  Patient presents today for lower abdominal pain and back pain.  She reports that she developed lower abdominal pain and feeling of constipation 1.5 weeks ago, she reports that symptoms have been progressively worsening since onset.  She went to her PCP and started taking laxatives yesterday, she has since developed small amounts of watery diarrhea as well as bilateral lower back pain.  She describes pain as aching constant moderate nonradiating no clear aggravating or alleviating factors.  She reports that since developing diarrhea she has had some lightheadedness as well as nausea.  Denies fever/chills, vision changes, headache, unilateral weakness, chest pain/shortness of breath, vomiting, upper abdominal pain, dysuria/hematuria, melena, hematochezia or any additional concerns. HPI     Past Medical History:  Diagnosis Date  . Hypertension     Patient Active Problem List   Diagnosis Date Noted  . Chest pressure 06/23/2015  . Chronic anticoagulation 04/09/2014  . Essential hypertension, benign 04/08/2013  . Hyperlipidemia 04/08/2013  . Atrial fibrillation ([place]) 02/13/2013    Past Surgical History:  Procedure Laterality Date  . KNEE SURGERY    . LUNG BIOPSY    . PARTIAL HYSTERECTOMY    . TONSILLECTOMY AND ADENOIDECTOMY       OB History   No obstetric history on file.     Family History  Problem Relation Age of Onset  . Other Unknown        NO HEALTH ISSUES    Social History   Tobacco Use  . Smoking status: Never Smoker  . Smokeless tobacco: Never Used  Substance Use Topics  . Alcohol use: Not on file  . Drug use: Not on file    Home Medications Prior  to Admission medications   Medication Sig Start Date End Date Taking? Authorizing Provider  amLODipine (NORVASC) 5 MG tablet Take 5 mg by mouth daily. 01/31/20  Yes [provider]  dorzolamide (TRUSOPT) 2 % ophthalmic solution Place 1 drop into both eyes 2 (two) times daily.    Yes [provider]  hydrochlorothiazide (MICROZIDE) 12.5 MG capsule Take 1 capsule (12.5 mg total) by mouth daily. *Please call and schedule a one year follow up appointment* 06/02/16  Yes Belva Crome, MD  losartan (COZAAR) 50 MG tablet Take 50 mg by mouth daily. 03/15/20  Yes [provider]  rOPINIRole (REQUIP) 0.25 MG tablet Take 0.25 mg by mouth 3 (three) times daily as needed. Restless leg 03/05/20  Yes [provider]  travoprost, benzalkonium, (TRAVATAN) 0.004 % ophthalmic solution Place 1 drop into both eyes at bedtime.    Yes [provider]  warfarin (COUMADIN) 1 MG tablet Take 1 mg by mouth See admin instructions. Patient alternates with 1/2 tablet = 1.5mg  every other day. 02/19/20  Yes [provider]  warfarin (COUMADIN) 3 MG tablet Take 1.5 mg by mouth See admin instructions. Patient alternates with 1 mg every other day    Yes [provider]  lisinopril (PRINIVIL,ZESTRIL) 20 MG tablet Patient takes 1.5 tablets by mouth daily Patient not taking: Reported on 03/27/2020 12/13/15   Belva Crome, MD    Allergies    Codeine, Meperidine, and Oxycodone  Review of Systems   Review of Systems Ten systems are reviewed and are negative for acute change except as noted in the HPI  Physical Exam Updated Vital Signs BP (!) 151/88   Pulse 66   Temp 97.6 F (36.4 C) (Oral)   Resp 19   SpO2 95%   Physical Exam Constitutional:      General: She is not in acute distress.    Appearance: Normal appearance. She is well-developed. She is not ill-appearing or diaphoretic.  HENT:     Head: Normocephalic and atraumatic.  Eyes:     General: Vision grossly  intact. Gaze aligned appropriately.     Pupils: Pupils are equal, round, and reactive to light.  Neck:     Trachea: Trachea and phonation normal.  Pulmonary:     Effort: Pulmonary effort is normal. No respiratory distress.  Abdominal:     General: There is no distension.     Palpations: Abdomen is soft.     Tenderness: There is abdominal tenderness in the right lower quadrant, suprapubic area and left lower quadrant. There is no guarding or rebound.  Musculoskeletal:        General: Normal range of motion.     Cervical back: Normal range of motion.     Comments: No midline C/T/L spinal tenderness to palpation, no paraspinal muscle tenderness, no deformity, crepitus, or step-off noted. No sign of injury to the neck or back.  Skin:    General: Skin is warm and dry.  Neurological:     Mental Status: She is alert.     GCS: GCS eye subscore is 4. GCS verbal subscore is 5. GCS motor subscore is 6.     Comments: Speech is clear and goal oriented, follows commands Major Cranial nerves without deficit, no facial droop Moves extremities without ataxia, coordination intact  Psychiatric:        Behavior: Behavior normal.     ED Results / Procedures / Treatments   Labs (all labs ordered are listed, but only abnormal results are displayed) Labs Reviewed  CBC WITH DIFFERENTIAL/PLATELET - Abnormal; Notable for the following components:      Result Value   Neutro Abs 8.6 (*)    All other components within normal limits  COMPREHENSIVE METABOLIC PANEL - Abnormal; Notable for the following components:   Potassium 2.0 (*)    Glucose, Bld 113 (*)    Calcium 6.2 (*)    Total Protein 4.9 (*)    Albumin 2.8 (*)    Alkaline Phosphatase 37 (*)    All other components within normal limits  URINALYSIS, ROUTINE W REFLEX MICROSCOPIC - Abnormal; Notable for the following components:   Ketones, ur 5 (*)    Protein, ur 100 (*)    Bacteria, UA RARE (*)    All other components within normal limits    PROTIME-INR - Abnormal; Notable for the following components:   Prothrombin Time 31.1 (*)    INR 3.1 (*)    All other components within normal limits  MAGNESIUM - Abnormal; Notable for the following components:   Magnesium 1.5 (*)    All other components within normal limits  RESP PANEL BY RT-PCR (FLU A&B, COVID) ARPGX2  LIPASE, BLOOD  LACTIC ACID, PLASMA    EKG EKG Interpretation  Date/Time:  Saturday March 27 2020 09:37:25 EST Ventricular Rate:  57 PR Interval:    QRS Duration: 89 QT Interval:  519 QTC Calculation: 506 R Axis:   68 Text Interpretation: Normal  sinus rhythm Borderline repolarization abnormality Prolonged QT interval background noise TECHNICALLY DIFFICULT Otherwise no significant change Confirmed by Deno Etienne [phone]) on 03/27/2020 10:49:47 AM   Radiology CT Abdomen Pelvis W Contrast  Result Date: 03/27/2020 CLINICAL DATA:  Upper abdominal and back pain. Suspected bowel obstruction. EXAM: CT ABDOMEN AND PELVIS WITH CONTRAST TECHNIQUE: Multidetector CT imaging of the abdomen and pelvis was performed using the standard protocol following bolus administration of intravenous contrast. CONTRAST:  152mL OMNIPAQUE IOHEXOL 300 MG/ML  SOLN COMPARISON:  04/17/2014 FINDINGS: Lower chest: Chronic nodular densities at the lung bases. Largest nodule is at the medial right lower lobe measuring up to 2.1 cm and stable since 2015. Probable postoperative changes with scarring along the anterior right lung base along the right major fissure. Minimal pleural thickening or trace fluid along the medial aspect of both lung bases at the level of T11 vertebral body. Hepatobiliary: Again noted are numerous hypodensities in the liver compatible with cysts. The largest cyst is in the right hepatic lobe and measures up to 7.5 cm. No suspicious liver lesion. Mild to moderate distention of the gallbladder without surrounding inflammatory changes. No significant extrahepatic biliary dilatation.  Pancreas: Unremarkable. No pancreatic ductal dilatation or surrounding inflammatory changes. Spleen: Normal in size without focal abnormality. Adrenals/Urinary Tract: Normal appearance of the adrenal glands. Multiple bilateral renal cysts. 2 mm stone in the right kidney lower pole region without hydronephrosis. Normal appearance of the urinary bladder. Stomach/Bowel: Normal appearance of the stomach. There is no evidence for bowel dilatation or focal bowel inflammation. No evidence for obstruction. Vascular/Lymphatic: Atherosclerotic calcifications involving the abdominal aorta without aneurysm. 60-70% stenosis at the origin of the celiac trunk. High-grade stenosis at the origin of the SMA, greater than 70%. Bilateral renal arteries are patent. Flow in the IMA but limited evaluation of the origin. No lymph node enlargement in the abdomen or pelvis. Reproductive: Status post hysterectomy. No adnexal masses. Other: Small amount of fluid in the pelvis is nonspecific. Negative for free air. Musculoskeletal: Again noted is severe levoscoliosis in the lumbar spine. Both hips are located. Fracture involving the inferior endplate of Q25. Minimal vertebral body height loss at T11, less than 20%. Minimal bone retropulsion along the posterior aspect of the T11 vertebral body. Small amount of paraspinal fluid or edema and same area as the pleural thickening or fluid at the lung bases. Findings are suggestive for an acute or subacute compression fracture at T11. IMPRESSION: 1. Compression fracture involving the inferior endplate of Z56 with a small amount of paraspinal edema. Findings are suggestive for an acute or subacute compression fracture. Severe scoliosis in the lumbar spine. 2. Small amount of pelvic ascites is nonspecific. Otherwise, no acute abnormalities within the abdomen or pelvis. 3. Atherosclerotic disease involving the abdominal aorta and visceral arteries. High-grade stenosis involving the origin of the SMA,  greater than 70%. 60-70% stenosis involving the origin of the celiac trunk. These vascular findings can be associated with chronic mesenteric ischemia with the appropriate clinical symptoms. 4. Chronic pulmonary nodules at the lung bases. No significant change since 2015 and most compatible with benign findings. 5. Hepatic and renal cysts.  Nonobstructive right kidney stone. 6. Gallbladder distention without surrounding inflammatory changes. Electronically Signed   By: Markus Daft M.D.   On: 03/27/2020 12:05    Procedures .Critical Care Performed by: Deliah Boston, PA-C Authorized by: Deliah Boston, PA-C   Critical care provider statement:    Critical care time (minutes):  35   Critical  care was necessary to treat or prevent imminent or life-threatening deterioration of the following conditions:  Metabolic crisis   Critical care was time spent personally by me on the following activities:  Discussions with consultants, evaluation of patient's response to treatment, examination of patient, ordering and performing treatments and interventions, ordering and review of laboratory studies, ordering and review of radiographic studies, pulse oximetry, re-evaluation of patient's condition, obtaining history from patient or surrogate, review of old charts and development of treatment plan with patient or surrogate   (including critical care time)  Medications Ordered in ED Medications  potassium chloride 10 mEq in 100 mL IVPB (has no administration in time range)  potassium chloride 10 mEq in 100 mL IVPB (10 mEq Intravenous New Bag/Given 03/27/20 1202)  calcium gluconate 1 g/ 50 mL sodium chloride IVPB (has no administration in time range)  sodium chloride (PF) 0.9 % injection (has no administration in time range)  magnesium sulfate IVPB 2 g 50 mL (has no administration in time range)  sodium chloride 0.9 % bolus 500 mL (0 mLs Intravenous Stopped 03/27/20 1148)  iohexol (OMNIPAQUE) 300 MG/ML  solution 100 mL (100 mLs Intravenous Contrast Given 03/27/20 1115)    ED Course  I have reviewed the triage vital signs and the nursing notes.  Pertinent labs & imaging results that were available during my care of the patient were reviewed by me and considered in my medical decision making (see chart for details).  Clinical Course as of Mar 28 1247  Sat Mar 27, 2020  1236 Dr. Marylyn Ishihara   [BM]    Clinical Course User Index [BM] Gari Crown   MDM Rules/Calculators/A&P                         Additional history obtained from: 1. Nursing notes from this visit. 2. Review of electronic medical record.  No pertinent recent ER visits. ------------------ I ordered, reviewed and interpreted labs which include: Magnesium low 1.5, will replete. CMP shows hypokalemia 2.0 and hypocalcemia 6.2, will replete, no AKI, no emergent LFT elevations, no gap. CBC without leukocytosis to suggest bacterial infection, no anemia. Urinalysis without evidence of infection. Lactic 1.5 is reassuring.  Low suspicion for sepsis or acute mesenteric ischemia. Lipase within normal limits, doubt pancreatitis. INR elevated at 3.1.  EKG: Normal sinus rhythm Borderline repolarization abnormality Prolonged QT interval background noise TECHNICALLY DIFFICULT Otherwise no significant change Confirmed by Deno Etienne [id number]) on 03/27/2020 10:49:47 AM  CTAP:  IMPRESSION:  1. Compression fracture involving the inferior endplate of P54 with  a small amount of paraspinal edema. Findings are suggestive for an  acute or subacute compression fracture. Severe scoliosis in the  lumbar spine.  2. Small amount of pelvic ascites is nonspecific. Otherwise, no  acute abnormalities within the abdomen or pelvis.  3. Atherosclerotic disease involving the abdominal aorta and  visceral arteries. High-grade stenosis involving the origin of the  SMA, greater than 70%. 60-70% stenosis involving the origin of the  celiac trunk.  These vascular findings can be associated with chronic  mesenteric ischemia with the appropriate clinical symptoms.  4. Chronic pulmonary nodules at the lung bases. No significant  change since 2015 and most compatible with benign findings.  5. Hepatic and renal cysts. Nonobstructive right kidney stone.  6. Gallbladder distention without surrounding inflammatory changes.  ----------------- Suspect patient's electrolyte derangements may be secondary from her diarrhea and decreased p.o. intake.  Repletion begun in the ER.  Will consult hospitalist service for admission.  Patient seen and evaluated by Dr. Tyrone Nine during this visit who agrees with plan for medicine admission. - Patient reassessed she is resting comfortably no acute distress, daughter at bedside. They state understanding of care plan and are agreeable for admission. - 12:36 PM: Consulted with hospitalist Dr. Marylyn Ishihara, patient accepted for admission.  Note: Portions of this report may have been transcribed using voice recognition software. Every effort was made to ensure accuracy; however, inadvertent computerized transcription errors may still be present. Final Clinical Impression(s) / ED Diagnoses Final diagnoses:  Hypokalemia  Hypomagnesemia  Hypocalcemia  Diarrhea, unspecified type    Rx / DC Orders ED Discharge Orders    None       Gari Crown 03/27/20 [place], DO 03/27/20 1309

## 2020-03-27 NOTE — ED Triage Notes (Signed)
Pt BIB EMS. Pt c/o diarrhea, nausea, back pain and dizziness. Pt stated she was constipated and took a laxative yesterday and now has diarrhea. EMS gave 4 zofran.   BP-196/100 (did not take AM meds) Temp-97.2 HR-80 O2-97% CBG-186

## 2020-03-27 NOTE — Plan of Care (Signed)
Care plan initiated.

## 2020-03-27 NOTE — ED Notes (Signed)
Date and time results received: 03/27/20 1049   Test: Calcium   Critical Value: 6.2   Name of Provider Notified: Athena Masse PA  Orders Received? Or Actions Taken?: Acknowledged

## 2020-03-27 NOTE — ED Notes (Signed)
Date and time results received: 03/27/20 1048   Test: Potassium   Critical Value: 2.0  Name of Provider Notified: Athena Masse PA  Orders Received? Or Actions Taken?: Acknowledged

## 2020-03-27 NOTE — ED Provider Notes (Signed)
2 PM: Consulted with orthopedic surgeon Dr. Lucia Gaskins regarding patient's T11 compression fracture on behalf of hospitalist service.  Dr. Lucia Gaskins advises that TLSO could be used for comfort as patient can tolerate it.  Ortho will see patient if requested, no emergent interventions needed. Dr. Marylyn Ishihara updated on recommendations.  Note: Portions of this report may have been transcribed using voice recognition software. Every effort was made to ensure accuracy; however, inadvertent computerized transcription errors may still be present.   Deliah Boston, PA-C 03/27/20 [place], [place], DO 03/27/20 1438

## 2020-03-27 NOTE — H&P (Addendum)
History and Physical    Danielle Thomas JHE:[id number] DOB: 06-19-1927 DOA: 03/27/2020  PCP: Cari Caraway, MD  Patient coming from: Home  Chief Complaint: back pain and diarrhea  HPI: Danielle Thomas is a 86 y.o. female with medical history significant of HTN and a fib. Presenting with back pain that started 2 or 3 days ago. She describes the pain as a "vice around my waist" with more pain on her right side. She denies any fall or injury. She tried tylenol but it provided no help, so she came to the ED.  She also notes that she had 1 week of constipation and poor appetite. She was instructed by her PCP to use miralax and suppositories to help. She did this vigorously over the last several days. She reports that she has had an "extremely large amount" of diarrhea over the last day. She reports nausea as well and poor appetite.    ED Course: Imaging was obtained that showed compression fracture of T11 and no bowel obstruction/stool burden. She was also noted to have a potassium of 2. A low calcium and magnesium. TRH was called for admission.   Review of Systems:  Reports back pain. Denies CP, palpitations, syncopal episodes, numbness/tingling, bowel/bladder incontinence, recent abx use, sick contacts. Review of systems is otherwise negative for all not mentioned in HPI.   PMHx Past Medical History:  Diagnosis Date  . Hypertension     PSHx Past Surgical History:  Procedure Laterality Date  . KNEE SURGERY    . LUNG BIOPSY    . PARTIAL HYSTERECTOMY    . TONSILLECTOMY AND ADENOIDECTOMY      SocHx  reports that she has never smoked. She has never used smokeless tobacco. No history on file for alcohol use and drug use.  Allergies  Allergen Reactions  . Codeine Nausea And Vomiting  . Meperidine Nausea Only  . Oxycodone Nausea And Vomiting    FamHx Family History  Problem Relation Age of Onset  . Other Unknown        NO HEALTH ISSUES    Prior to Admission medications     Medication Sig Start Date End Date Taking? Authorizing Provider  amLODipine (NORVASC) 5 MG tablet Take 5 mg by mouth daily. 01/31/20  Yes [provider]  dorzolamide (TRUSOPT) 2 % ophthalmic solution Place 1 drop into both eyes 2 (two) times daily.    Yes [provider]  hydrochlorothiazide (MICROZIDE) 12.5 MG capsule Take 1 capsule (12.5 mg total) by mouth daily. *Please call and schedule a one year follow up appointment* 06/02/16  Yes Belva Crome, MD  losartan (COZAAR) 50 MG tablet Take 50 mg by mouth daily. 03/15/20  Yes [provider]  rOPINIRole (REQUIP) 0.25 MG tablet Take 0.25 mg by mouth 3 (three) times daily as needed. Restless leg 03/05/20  Yes [provider]  travoprost, benzalkonium, (TRAVATAN) 0.004 % ophthalmic solution Place 1 drop into both eyes at bedtime.    Yes [provider]  warfarin (COUMADIN) 1 MG tablet Take 1 mg by mouth See admin instructions. Patient alternates with 1/2 tablet = 1.5mg  every other day. 02/19/20  Yes [provider]  warfarin (COUMADIN) 3 MG tablet Take 1.5 mg by mouth See admin instructions. Patient alternates with 1 mg every other day    Yes [provider]  lisinopril (PRINIVIL,ZESTRIL) 20 MG tablet Patient takes 1.5 tablets by mouth daily Patient not taking: Reported on 03/27/2020 12/13/15   Belva Crome, MD  Physical Exam: Vitals:   03/27/20 0907 03/27/20 1025 03/27/20 1240  BP: (!) 161/76 (!) 151/88 (!) 165/98  Pulse: 68 66 (!) 109  Resp: 17 19 17   Temp: 97.6 F (36.4 C)    TempSrc: Oral    SpO2: 97% 95% 94%    General: 86 y.o. female resting in bed in NAD Eyes: PERRL, normal sclera ENMT: Nares patent w/o discharge, orophaynx clear, dentition normal, ears w/o discharge/lesions/ulcers Neck: Supple, trachea midline Cardiovascular: RRR, +S1, S2, no m/g/r, equal pulses throughout Respiratory: CTABL, no w/r/r, normal WOB GI: BS+, NDNT, no masses noted, no organomegaly  noted MSK: No e/c/c; spasm and TTP to right lumbar area, scoliosis Skin: No rashes, bruises, ulcerations noted Neuro: A&O x 3, no focal deficits Psyc: Appropriate interaction and affect, calm/cooperative  Labs on Admission: I have personally reviewed following labs and imaging studies  CBC: Recent Labs  Lab 03/27/20 0927  WBC 10.4  NEUTROABS 8.6*  HGB 15.0  HCT 45.0  MCV 89.6  PLT 702   Basic Metabolic Panel: Recent Labs  Lab 03/27/20 0927 03/27/20 1050  NA 139  --   K 2.0*  --   CL 106  --   CO2 25  --   GLUCOSE 113*  --   BUN 12  --   CREATININE 0.45  --   CALCIUM 6.2*  --   MG  --  1.5*   GFR: CrCl cannot be calculated (Unknown ideal weight.). Liver Function Tests: Recent Labs  Lab 03/27/20 0927  AST 15  ALT 11  ALKPHOS 37*  BILITOT 0.7  PROT 4.9*  ALBUMIN 2.8*   Recent Labs  Lab 03/27/20 0927  LIPASE 22   No results for input(s): AMMONIA in the last 168 hours. Coagulation Profile: Recent Labs  Lab 03/27/20 1030  INR 3.1*   Cardiac Enzymes: No results for input(s): CKTOTAL, CKMB, CKMBINDEX, TROPONINI in the last 168 hours. BNP (last 3 results) No results for input(s): PROBNP in the last 8760 hours. HbA1C: No results for input(s): HGBA1C in the last 72 hours. CBG: No results for input(s): GLUCAP in the last 168 hours. Lipid Profile: No results for input(s): CHOL, HDL, LDLCALC, TRIG, CHOLHDL, LDLDIRECT in the last 72 hours. Thyroid Function Tests: No results for input(s): TSH, T4TOTAL, FREET4, T3FREE, THYROIDAB in the last 72 hours. Anemia Panel: No results for input(s): VITAMINB12, FOLATE, FERRITIN, TIBC, IRON, RETICCTPCT in the last 72 hours. Urine analysis:    Component Value Date/Time   COLORURINE YELLOW 03/27/2020 1032   APPEARANCEUR CLEAR 03/27/2020 1032   LABSPEC 1.008 03/27/2020 1032   PHURINE 7.0 03/27/2020 1032   GLUCOSEU NEGATIVE 03/27/2020 1032   HGBUR NEGATIVE 03/27/2020 1032   BILIRUBINUR NEGATIVE 03/27/2020 1032    KETONESUR 5 (A) 03/27/2020 1032   PROTEINUR 100 (A) 03/27/2020 1032   NITRITE NEGATIVE 03/27/2020 1032   LEUKOCYTESUR NEGATIVE 03/27/2020 1032    Radiological Exams on Admission: CT Abdomen Pelvis W Contrast  Result Date: 03/27/2020 CLINICAL DATA:  Upper abdominal and back pain. Suspected bowel obstruction. EXAM: CT ABDOMEN AND PELVIS WITH CONTRAST TECHNIQUE: Multidetector CT imaging of the abdomen and pelvis was performed using the standard protocol following bolus administration of intravenous contrast. CONTRAST:  146mL OMNIPAQUE IOHEXOL 300 MG/ML  SOLN COMPARISON:  04/17/2014 FINDINGS: Lower chest: Chronic nodular densities at the lung bases. Largest nodule is at the medial right lower lobe measuring up to 2.1 cm and stable since 2015. Probable postoperative changes with scarring along the anterior right lung base along  the right major fissure. Minimal pleural thickening or trace fluid along the medial aspect of both lung bases at the level of T11 vertebral body. Hepatobiliary: Again noted are numerous hypodensities in the liver compatible with cysts. The largest cyst is in the right hepatic lobe and measures up to 7.5 cm. No suspicious liver lesion. Mild to moderate distention of the gallbladder without surrounding inflammatory changes. No significant extrahepatic biliary dilatation. Pancreas: Unremarkable. No pancreatic ductal dilatation or surrounding inflammatory changes. Spleen: Normal in size without focal abnormality. Adrenals/Urinary Tract: Normal appearance of the adrenal glands. Multiple bilateral renal cysts. 2 mm stone in the right kidney lower pole region without hydronephrosis. Normal appearance of the urinary bladder. Stomach/Bowel: Normal appearance of the stomach. There is no evidence for bowel dilatation or focal bowel inflammation. No evidence for obstruction. Vascular/Lymphatic: Atherosclerotic calcifications involving the abdominal aorta without aneurysm. 60-70% stenosis at the  origin of the celiac trunk. High-grade stenosis at the origin of the SMA, greater than 70%. Bilateral renal arteries are patent. Flow in the IMA but limited evaluation of the origin. No lymph node enlargement in the abdomen or pelvis. Reproductive: Status post hysterectomy. No adnexal masses. Other: Small amount of fluid in the pelvis is nonspecific. Negative for free air. Musculoskeletal: Again noted is severe levoscoliosis in the lumbar spine. Both hips are located. Fracture involving the inferior endplate of Z61. Minimal vertebral body height loss at T11, less than 20%. Minimal bone retropulsion along the posterior aspect of the T11 vertebral body. Small amount of paraspinal fluid or edema and same area as the pleural thickening or fluid at the lung bases. Findings are suggestive for an acute or subacute compression fracture at T11. IMPRESSION: 1. Compression fracture involving the inferior endplate of W96 with a small amount of paraspinal edema. Findings are suggestive for an acute or subacute compression fracture. Severe scoliosis in the lumbar spine. 2. Small amount of pelvic ascites is nonspecific. Otherwise, no acute abnormalities within the abdomen or pelvis. 3. Atherosclerotic disease involving the abdominal aorta and visceral arteries. High-grade stenosis involving the origin of the SMA, greater than 70%. 60-70% stenosis involving the origin of the celiac trunk. These vascular findings can be associated with chronic mesenteric ischemia with the appropriate clinical symptoms. 4. Chronic pulmonary nodules at the lung bases. No significant change since 2015 and most compatible with benign findings. 5. Hepatic and renal cysts.  Nonobstructive right kidney stone. 6. Gallbladder distention without surrounding inflammatory changes. Electronically Signed   By: Markus Daft M.D.   On: 03/27/2020 12:05    EKG: Independently reviewed. sinus, prolonged Qtc, no st  changes  Assessment/Plan Hypokalemia Hypocalcemia Hypomagnesemia     - admit to inpatient, telemetry     - significant hypoK+ (2.0) replace w/ IV and PO     - EKG w/ prolonged Qtc at this time     - replace Mg2+ and Ca2+  Prolonged Qtc     - electrolytes playing a role     - watch Qtc prolonging meds  T11 Compression fracture     - EDP spoke with ortho; said could try TLSO brace if she can tolerate it with her scoliosis     - pt already has an ortho appt scheduled for Tuesday and would rather keep that appointment than see the ortho team here     - pain control  Afib    - on coumadin     - she's sinus now  HTN     - resume  norvasc, cozaar  DVT prophylaxis: Coumadin  Code Status: FULL  Family Communication: w/ family at bedside  Consults called: None  Status is: Inpatient  Remains inpatient appropriate because:Inpatient level of care appropriate due to severity of illness   Dispo: The patient is from: Home              Anticipated d/c is to: Home              Anticipated d/c date is: 2 days              Patient currently is not medically stable to d/c.  Jonnie Finner DO [facility]  If 7PM-7AM, please contact night-coverage [email or web address]  03/27/2020, 12:55 PM

## 2020-03-28 DIAGNOSIS — K551 Chronic vascular disorders of intestine: Secondary | ICD-10-CM

## 2020-03-28 DIAGNOSIS — I484 Atypical atrial flutter: Secondary | ICD-10-CM

## 2020-03-28 DIAGNOSIS — S22080A Wedge compression fracture of T11-T12 vertebra, initial encounter for closed fracture: Secondary | ICD-10-CM

## 2020-03-28 DIAGNOSIS — R5381 Other malaise: Secondary | ICD-10-CM | POA: Diagnosis not present

## 2020-03-28 DIAGNOSIS — I774 Celiac artery compression syndrome: Secondary | ICD-10-CM

## 2020-03-28 DIAGNOSIS — M545 Low back pain, unspecified: Secondary | ICD-10-CM

## 2020-03-28 DIAGNOSIS — E876 Hypokalemia: Secondary | ICD-10-CM | POA: Diagnosis not present

## 2020-03-28 LAB — COMPREHENSIVE METABOLIC PANEL
ALT: 16 U/L (ref 0–44)
AST: 22 U/L (ref 15–41)
Albumin: 3 g/dL — ABNORMAL LOW (ref 3.5–5.0)
Alkaline Phosphatase: 40 U/L (ref 38–126)
Anion gap: 10 (ref 5–15)
BUN: 18 mg/dL (ref 8–23)
CO2: 26 mmol/L (ref 22–32)
Calcium: 8.1 mg/dL — ABNORMAL LOW (ref 8.9–10.3)
Chloride: 104 mmol/L (ref 98–111)
Creatinine, Ser: 0.74 mg/dL (ref 0.44–1.00)
GFR, Estimated: >60 mL/min (ref >60)
Glucose, Bld: 106 mg/dL — ABNORMAL HIGH (ref 70–99)
Potassium: 3.4 mmol/L — ABNORMAL LOW (ref 3.5–5.1)
Sodium: 140 mmol/L (ref 135–145)
Total Bilirubin: 0.9 mg/dL (ref 0.3–1.2)
Total Protein: 5.8 g/dL — ABNORMAL LOW (ref 6.5–8.1)

## 2020-03-28 LAB — MAGNESIUM: Magnesium: 2.3 mg/dL (ref 1.7–2.4)

## 2020-03-28 LAB — CBC
HCT: 43.1 % (ref 36.0–46.0)
Hemoglobin: 14.1 g/dL (ref 12.0–15.0)
MCH: 29.6 pg (ref 26.0–34.0)
MCHC: 32.7 g/dL (ref 30.0–36.0)
MCV: 90.5 fL (ref 80.0–100.0)
Platelets: 217 10*3/uL (ref 150–400)
RBC: 4.76 MIL/uL (ref 3.87–5.11)
RDW: 14.3 % (ref 11.5–15.5)
WBC: 8 10*3/uL (ref 4.0–10.5)
nRBC: 0 % (ref 0.0–0.2)

## 2020-03-28 LAB — PROTIME-INR
INR: 2.8 — ABNORMAL HIGH (ref 0.8–1.2)
Prothrombin Time: 28.7 seconds — ABNORMAL HIGH (ref 11.4–15.2)

## 2020-03-28 MED ORDER — POTASSIUM CHLORIDE 20 MEQ PO PACK
40.0000 meq | PACK | Freq: Once | ORAL | Status: AC
  Administered 2020-03-28: 40 meq via ORAL
  Filled 2020-03-28: qty 2

## 2020-03-28 MED ORDER — POLYETHYLENE GLYCOL 3350 17 G PO PACK: 17.0000 g | PACK | Freq: Two times a day (BID) | ORAL | Status: DC | PRN

## 2020-03-28 MED ORDER — TRAMADOL HCL 50 MG PO TABS
50.0000 mg | ORAL_TABLET | Freq: Three times a day (TID) | ORAL | Status: DC | PRN
  Administered 2020-03-30: 50 mg via ORAL
  Filled 2020-03-28 (×2): qty 1

## 2020-03-28 MED ORDER — AMLODIPINE BESYLATE 10 MG PO TABS
10.0000 mg | ORAL_TABLET | Freq: Every day | ORAL | Status: DC
  Administered 2020-03-28 – 2020-03-30 (×3): 10 mg via ORAL
  Filled 2020-03-28 (×3): qty 1

## 2020-03-28 MED ORDER — CALCITONIN (SALMON) 200 UNIT/ACT NA SOLN
1.0000 | Freq: Every day | NASAL | Status: DC
  Administered 2020-03-28 – 2020-03-30 (×3): 1 via NASAL
  Filled 2020-03-28: qty 3.7

## 2020-03-28 MED ORDER — SODIUM CHLORIDE 0.9 % IV BOLUS
500.0000 mL | Freq: Once | INTRAVENOUS | Status: AC
  Administered 2020-03-28: 500 mL via INTRAVENOUS

## 2020-03-28 MED ORDER — POTASSIUM CHLORIDE 10 MEQ/100ML IV SOLN
10.0000 meq | INTRAVENOUS | Status: DC
  Administered 2020-03-28: 10 meq via INTRAVENOUS
  Filled 2020-03-28: qty 100

## 2020-03-28 MED ORDER — OXYCODONE HCL 5 MG PO TABS
5.0000 mg | ORAL_TABLET | Freq: Three times a day (TID) | ORAL | Status: DC | PRN
  Administered 2020-03-28 – 2020-03-30 (×5): 5 mg via ORAL
  Filled 2020-03-28 (×5): qty 1

## 2020-03-28 MED ORDER — POTASSIUM CHLORIDE CRYS ER 20 MEQ PO TBCR
40.0000 meq | EXTENDED_RELEASE_TABLET | ORAL | Status: DC
  Administered 2020-03-28: 40 meq via ORAL
  Filled 2020-03-28: qty 2

## 2020-03-28 MED ORDER — ACETAMINOPHEN 500 MG PO TABS
1000.0000 mg | ORAL_TABLET | Freq: Three times a day (TID) | ORAL | Status: DC
  Administered 2020-03-28 – 2020-03-30 (×8): 1000 mg via ORAL
  Filled 2020-03-28 (×8): qty 2

## 2020-03-28 MED ORDER — SENNA 8.6 MG PO TABS
1.0000 | ORAL_TABLET | Freq: Every day | ORAL | Status: DC | PRN
  Filled 2020-03-28: qty 1

## 2020-03-28 MED ORDER — DOCUSATE SODIUM 100 MG PO CAPS
100.0000 mg | ORAL_CAPSULE | Freq: Every day | ORAL | Status: DC
  Administered 2020-03-28 – 2020-03-29 (×2): 100 mg via ORAL
  Filled 2020-03-28 (×3): qty 1

## 2020-03-28 NOTE — Progress Notes (Signed)
PROGRESS NOTE  Danielle Thomas TKW:[id number] DOB: 01-Jul-1927   PCP: Cari Caraway, MD  Patient is from: Home  DOA: 03/27/2020 LOS: 1  Chief complaints: Mild pain and diarrhea  Brief Narrative / Interim history: 86 year old female with history of A. fib on warfarin and HTN presenting with acute back pain for 2 to 3 days, constipation for 1 week followed by diarrhea and poor p.o. intake.  She was admitted for significant electrolyte derangement with hypokalemia, hypocalcemia, hypomagnesemia and acute T11 compression fracture with minimal vertebral body height loss less than 20% and minimal bone retropulsion along the posterior aspect of the T11 vertebral body.  CT abdomen and pelvis also revealed incidental high-grade stenosis (70%) involving the origin of SMA and 60 to 70% stenosis at the origin of the celiac trunk.  Subjective: Seen and examined earlier this morning.  No major events overnight or this morning.  Reports significant pain in her back but denies new focal numbness, tingling or weakness in her legs or bowel or bladder issue. Denies chest pain or dyspnea.  Denies palpitation or dizziness.  Objective: Vitals:   03/27/20 2002 03/28/20 0137 03/28/20 0516 03/28/20 0943  BP: 132/68 (!) 147/75 (!) 157/81 138/72  Pulse: 64 65 61   Resp: 18 20 20    Temp: 98.3 F (36.8 C) 97.8 F (36.6 C) 98.2 F (36.8 C)   TempSrc: Oral Oral Oral   SpO2: 96% 94% 94%   Weight:      Height:        Intake/Output Summary (Last 24 hours) at 03/28/2020 1135 Last data filed at 03/28/2020 0600 Gross per 24 hour  Intake 389.71 ml  Output 400 ml  Net -10.29 ml   Filed Weights   03/27/20 1840  Weight: 72.7 kg    Examination:  GENERAL: No apparent distress.  Nontoxic. HEENT: MMM.  Vision and hearing grossly intact.  NECK: Supple.  No apparent JVD.  RESP:  No IWOB.  Fair aeration bilaterally. CVS:  RRR. Heart sounds normal.  ABD/GI/GU: BS+. Abd soft, NTND.  MSK/EXT:  Moves extremities.  No apparent deformity. No edema.  SKIN: no apparent skin lesion or wound NEURO: Awake, alert and oriented appropriately.  No apparent focal neuro deficit. PSYCH: Calm. Normal affect.  Procedures:  None  Microbiology summarized: [id number], influenza and RSV PCR nonreactive.  Assessment & Plan: Hypokalemia/hypocalcemia/hypomagnesemia: Likely due to diarrhea and concurrent use of HCTZ +/-losartan.  Diarrhea seems to have subsided. -Replenish potassium, 40 mEq x 2.  Prolonged QTC?  Difficult to assess QT due to lack of clear T waves as EKG seems to show a flutter. -Repeat twelve-lead EKG -Optimize electrolytes  Acute T11 compression fracture: acute T11 compression fracture with minimal vertebral body height loss less than 20% and minimal bone retropulsion along the posterior aspect of the T11 vertebral body.  Per admitting provider, EDP discussed with Ortho who recommended TLSO brace if she tolerates. -Patient has upcoming appointment with her orthopedic surgeon on 11/23. -Pain control with scheduled Tylenol, as needed Robaxin, tramadol, oxycodone and Toradol injection -Calcitonin nasal spray  SMA and celiac trunk stenosis 70% stenosis involving the origin of SMA and celiac trunk.  No signs of bowel ischemia. -Outpatient follow-up with vascular surgery  Paroxysmal A. Fib: EKG appears to show a flutter with good rate control.  On warfarin for anticoagulation.  INR therapeutic. -Continue warfarin -Optimize electrolytes  Essential hypertension: BP slightly elevated. -Increase amlodipine to 10 mg daily -Continue home losartan -Hold HCTZ in the setting of hypokalemia.  Debility/physical deconditioning: -PT/OT eval.  Diarrhea/constipation: Seems to have resolved.  Body mass index is 28.39 kg/m.         DVT prophylaxis:   warfarin (COUMADIN) tablet 1 mg  warfarin (COUMADIN) tablet 1.5 mg  Code Status: Full code Family Communication: Updated patient's daughter at  bedside. Status is: Inpatient  Remains inpatient appropriate because:Persistent severe electrolyte disturbances, Ongoing active pain requiring inpatient pain management and Inpatient level of care appropriate due to severity of illness   Dispo: The patient is from: Home              Anticipated d/c is to: Home              Anticipated d/c date is: 1 day              Patient currently is not medically stable to d/c.       Consultants:  Orthopedic surgery by EDP on admission   Sch Meds:  Scheduled Meds:  acetaminophen  1,000 mg Oral Q8H   amLODipine  10 mg Oral Daily   calcitonin (salmon)  1 spray Alternating Nares Daily   dorzolamide  1 drop Both Eyes BID   latanoprost  1 drop Both Eyes QHS   losartan  50 mg Oral Daily   potassium chloride  40 mEq Oral Once   warfarin  1 mg Oral Q48H   [START ON 03/29/2020] warfarin  1.5 mg Oral Q48H   Warfarin - Physician Dosing Inpatient   Does not apply q1600   Continuous Infusions:  methocarbamol (ROBAXIN) IV     PRN Meds:.ketorolac, methocarbamol (ROBAXIN) IV, ondansetron **OR** ondansetron (ZOFRAN) IV, oxyCODONE, rOPINIRole, traMADol  Antimicrobials: Anti-infectives (From admission, onward)   None       I have personally reviewed the following labs and images: CBC: Recent Labs  Lab 03/27/20 0927 03/28/20 0603  WBC 10.4 8.0  NEUTROABS 8.6*  --   HGB 15.0 14.1  HCT 45.0 43.1  MCV 89.6 90.5  PLT 225 217   BMP &GFR Recent Labs  Lab 03/27/20 0927 03/27/20 1050 03/27/20 2047 03/28/20 0603  NA 139  --  136 140  K 2.0*  --  3.6 3.4*  CL 106  --  102 104  CO2 25  --  25 26  GLUCOSE 113*  --  141* 106*  BUN 12  --  14 18  CREATININE 0.45  --  0.75 0.74  CALCIUM 6.2*  --  7.9* 8.1*  MG  --  1.5*  --  2.3  PHOS  --   --  1.9*  --    Estimated Creatinine Clearance: 44.6 mL/min (by C-G formula based on SCr of 0.74 mg/dL). Liver & Pancreas: Recent Labs  Lab 03/27/20 0927 03/27/20 2047 03/28/20 0603   AST 15  --  22  ALT 11  --  16  ALKPHOS 37*  --  40  BILITOT 0.7  --  0.9  PROT 4.9*  --  5.8*  ALBUMIN 2.8* 3.2* 3.0*   Recent Labs  Lab 03/27/20 0927  LIPASE 22   No results for input(s): AMMONIA in the last 168 hours. Diabetic: No results for input(s): HGBA1C in the last 72 hours. No results for input(s): GLUCAP in the last 168 hours. Cardiac Enzymes: No results for input(s): CKTOTAL, CKMB, CKMBINDEX, TROPONINI in the last 168 hours. No results for input(s): PROBNP in the last 8760 hours. Coagulation Profile: Recent Labs  Lab 03/27/20 1030 03/28/20 0603  INR 3.1* 2.8*  Thyroid Function Tests: No results for input(s): TSH, T4TOTAL, FREET4, T3FREE, THYROIDAB in the last 72 hours. Lipid Profile: No results for input(s): CHOL, HDL, LDLCALC, TRIG, CHOLHDL, LDLDIRECT in the last 72 hours. Anemia Panel: No results for input(s): VITAMINB12, FOLATE, FERRITIN, TIBC, IRON, RETICCTPCT in the last 72 hours. Urine analysis:    Component Value Date/Time   COLORURINE YELLOW 03/27/2020 1032   APPEARANCEUR CLEAR 03/27/2020 1032   LABSPEC 1.008 03/27/2020 1032   PHURINE 7.0 03/27/2020 1032   GLUCOSEU NEGATIVE 03/27/2020 1032   HGBUR NEGATIVE 03/27/2020 1032   BILIRUBINUR NEGATIVE 03/27/2020 1032   KETONESUR 5 (A) 03/27/2020 1032   PROTEINUR 100 (A) 03/27/2020 1032   NITRITE NEGATIVE 03/27/2020 1032   LEUKOCYTESUR NEGATIVE 03/27/2020 1032   Sepsis Labs: Invalid input(s): PROCALCITONIN, Ethel  Microbiology: Recent Results (from the past 240 hour(s))  Resp Panel by RT-PCR (Flu A&B, Covid) Nasopharyngeal Swab     Status: None   Collection Time: 03/27/20 12:20 PM   Specimen: Nasopharyngeal Swab; Nasopharyngeal(NP) swabs in vial transport medium  Result Value Ref Range Status   SARS Coronavirus 2 by RT PCR NEGATIVE NEGATIVE Final    Comment: (NOTE) SARS-CoV-2 target nucleic acids are NOT DETECTED.  The SARS-CoV-2 RNA is generally detectable in upper  respiratory specimens during the acute phase of infection. The lowest concentration of SARS-CoV-2 viral copies this assay can detect is 138 copies/mL. A negative result does not preclude SARS-Cov-2 infection and should not be used as the sole basis for treatment or other patient management decisions. A negative result may occur with  improper specimen collection/handling, submission of specimen other than nasopharyngeal swab, presence of viral mutation(s) within the areas targeted by this assay, and inadequate number of viral copies(<138 copies/mL). A negative result must be combined with clinical observations, patient history, and epidemiological information. The expected result is Negative.  Fact Sheet for Patients:  EntrepreneurPulse.com.au  Fact Sheet for Healthcare Providers:  IncredibleEmployment.be  This test is no t yet approved or cleared by the [place] FDA and  has been authorized for detection and/or diagnosis of SARS-CoV-2 by FDA under an Emergency Use Authorization (EUA). This EUA will remain  in effect (meaning this test can be used) for the duration of the COVID-19 declaration under Section 564(b)(1) of the Act, 21 U.S.C.section 360bbb-3(b)(1), unless the authorization is terminated  or revoked sooner.       Influenza A by PCR NEGATIVE NEGATIVE Final   Influenza B by PCR NEGATIVE NEGATIVE Final    Comment: (NOTE) The Xpert Xpress SARS-CoV-2/FLU/RSV plus assay is intended as an aid in the diagnosis of influenza from Nasopharyngeal swab specimens and should not be used as a sole basis for treatment. Nasal washings and aspirates are unacceptable for Xpert Xpress SARS-CoV-2/FLU/RSV testing.  Fact Sheet for Patients: EntrepreneurPulse.com.au  Fact Sheet for Healthcare Providers: IncredibleEmployment.be  This test is not yet approved or cleared by the [place] FDA and has been  authorized for detection and/or diagnosis of SARS-CoV-2 by FDA under an Emergency Use Authorization (EUA). This EUA will remain in effect (meaning this test can be used) for the duration of the COVID-19 declaration under Section 564(b)(1) of the Act, 21 U.S.C. section 360bbb-3(b)(1), unless the authorization is terminated or revoked.  Performed at [facility], [place] [address], [place], [place] [address]     Radiology Studies: No results found.    Arrow [place] [place]  If 7PM-7AM, please contact night-coverage [email or web address] 03/28/2020, 11:35 AM

## 2020-03-28 NOTE — Plan of Care (Signed)
  Problem: Education: Goal: Knowledge of General Education information will improve Description: Including pain rating scale, medication(s)/side effects and non-pharmacologic comfort measures Outcome: Progressing   Problem: Clinical Measurements: Goal: Will remain free from infection Outcome: Progressing Goal: Diagnostic test results will improve Outcome: Progressing   Problem: Activity: Goal: Risk for activity intolerance will decrease Outcome: Progressing   Problem: Nutrition: Goal: Adequate nutrition will be maintained Outcome: Progressing   Problem: Coping: Goal: Level of anxiety will decrease Outcome: Progressing   Problem: Elimination: Goal: Will not experience complications related to bowel motility Outcome: Progressing Goal: Will not experience complications related to urinary retention Outcome: Progressing   Problem: Safety: Goal: Ability to remain free from injury will improve Outcome: Progressing   Problem: Skin Integrity: Goal: Risk for impaired skin integrity will decrease Outcome: Progressing

## 2020-03-28 NOTE — Progress Notes (Signed)
   03/28/20 0137  Vitals  Temp 97.8 F (36.6 C)  Temp Source Oral  BP (!) 147/75  MAP (mmHg) 98  BP Location Left Arm  BP Method Automatic  Patient Position (if appropriate) Lying  Pulse Rate 65  Pulse Rate Source Monitor  Resp 20  Level of Consciousness  Level of Consciousness Alert  MEWS COLOR  MEWS Score Color Green  Oxygen Therapy  SpO2 94 %  O2 Device Room Air  MEWS Score  MEWS Temp 0  MEWS Systolic 0  MEWS Pulse 0  MEWS RR 0  MEWS LOC 0  MEWS Score 0  Patient has a history of A-Fib and an episode where the patient's heart rate got down to 38 with a 2.46 second pause. Nurse notified MD on call. Waiting on any recommendations.

## 2020-03-28 NOTE — Plan of Care (Signed)
  Problem: Education: Goal: Knowledge of General Education information will improve Description: Including pain rating scale, medication(s)/side effects and non-pharmacologic comfort measures Outcome: Progressing   Problem: Clinical Measurements: Goal: Will remain free from infection Outcome: Progressing Goal: Diagnostic test results will improve Outcome: Progressing   Problem: Nutrition: Goal: Adequate nutrition will be maintained Outcome: Progressing   Problem: Coping: Goal: Level of anxiety will decrease Outcome: Progressing   Problem: Elimination: Goal: Will not experience complications related to bowel motility Outcome: Progressing Goal: Will not experience complications related to urinary retention Outcome: Progressing   Problem: Safety: Goal: Ability to remain free from injury will improve Outcome: Progressing   Problem: Skin Integrity: Goal: Risk for impaired skin integrity will decrease Outcome: Progressing

## 2020-03-29 ENCOUNTER — Other Ambulatory Visit: Payer: Self-pay

## 2020-03-29 DIAGNOSIS — I4819 Other persistent atrial fibrillation: Secondary | ICD-10-CM

## 2020-03-29 DIAGNOSIS — S22080A Wedge compression fracture of T11-T12 vertebra, initial encounter for closed fracture: Secondary | ICD-10-CM | POA: Diagnosis not present

## 2020-03-29 DIAGNOSIS — I10 Essential (primary) hypertension: Secondary | ICD-10-CM | POA: Diagnosis not present

## 2020-03-29 DIAGNOSIS — R5381 Other malaise: Secondary | ICD-10-CM | POA: Diagnosis not present

## 2020-03-29 LAB — RENAL FUNCTION PANEL
Albumin: 3.2 g/dL — ABNORMAL LOW (ref 3.5–5.0)
Anion gap: 8 (ref 5–15)
BUN: 27 mg/dL — ABNORMAL HIGH (ref 8–23)
CO2: 25 mmol/L (ref 22–32)
Calcium: 8.2 mg/dL — ABNORMAL LOW (ref 8.9–10.3)
Chloride: 103 mmol/L (ref 98–111)
Creatinine, Ser: 0.86 mg/dL (ref 0.44–1.00)
GFR, Estimated: >60 mL/min (ref >60)
Glucose, Bld: 131 mg/dL — ABNORMAL HIGH (ref 70–99)
Phosphorus: 2.2 mg/dL — ABNORMAL LOW (ref 2.5–4.6)
Potassium: 4.5 mmol/L (ref 3.5–5.1)
Sodium: 136 mmol/L (ref 135–145)

## 2020-03-29 LAB — CBC
HCT: 43.4 % (ref 36.0–46.0)
Hemoglobin: 14.2 g/dL (ref 12.0–15.0)
MCH: 30 pg (ref 26.0–34.0)
MCHC: 32.7 g/dL (ref 30.0–36.0)
MCV: 91.6 fL (ref 80.0–100.0)
Platelets: 223 10*3/uL (ref 150–400)
RBC: 4.74 MIL/uL (ref 3.87–5.11)
RDW: 14.6 % (ref 11.5–15.5)
WBC: 9 10*3/uL (ref 4.0–10.5)
nRBC: 0 % (ref 0.0–0.2)

## 2020-03-29 LAB — MAGNESIUM: Magnesium: 2.2 mg/dL (ref 1.7–2.4)

## 2020-03-29 LAB — PROTIME-INR
INR: 2.7 — ABNORMAL HIGH (ref 0.8–1.2)
Prothrombin Time: 27.9 seconds — ABNORMAL HIGH (ref 11.4–15.2)

## 2020-03-29 MED ORDER — POLYETHYLENE GLYCOL 3350 17 G PO PACK: 17.0000 g | PACK | Freq: Two times a day (BID) | ORAL | Status: DC | PRN

## 2020-03-29 MED ORDER — HYDROCHLOROTHIAZIDE 12.5 MG PO CAPS
12.5000 mg | ORAL_CAPSULE | Freq: Every day | ORAL | Status: DC
  Administered 2020-03-29 – 2020-03-30 (×2): 12.5 mg via ORAL
  Filled 2020-03-29 (×2): qty 1

## 2020-03-29 NOTE — NC FL2 (Signed)
[place] LEVEL OF CARE SCREENING TOOL     IDENTIFICATION  Patient Name: Danielle Thomas Birthdate: May 21, 1927 Sex: female Admission Date (Current Location): 03/27/2020  [facility] and [place] Number:  Herbalist and Address:  [facility],  [place], [place]      Provider Number: [id number]  Attending Physician Name and Address:  Mercy Riding, MD  Relative Name and Phone Number:  Kalman Drape [phone]    Current Level of Care: Hospital Recommended Level of Care: [place] Prior Approval Number:    Date Approved/Denied:   PASRR Number: [id number]  Discharge Plan: SNF    Current Diagnoses: Patient Active Problem List   Diagnosis Date Noted  . Hypokalemia 03/27/2020  . Chest pressure 06/23/2015  . Chronic anticoagulation 04/09/2014  . Essential hypertension, benign 04/08/2013  . Hyperlipidemia 04/08/2013  . Atrial fibrillation ([place]) 02/13/2013    Orientation RESPIRATION BLADDER Height & Weight     Self, Time, Situation, Place  Normal Continent Weight: 72.7 kg Height:  5\' 3"  (160 cm)  BEHAVIORAL SYMPTOMS/MOOD NEUROLOGICAL BOWEL NUTRITION STATUS      Continent Diet (Heart Healthy)  AMBULATORY STATUS COMMUNICATION OF NEEDS Skin   Limited Assist Verbally Normal                       Personal Care Assistance Level of Assistance  Bathing, Dressing Bathing Assistance: Limited assistance   Dressing Assistance: Limited assistance     Functional Limitations Info  Hearing, Sight, Speech Sight Info: Impaired Administrator, sports) Hearing Info: Impaired (Bilateral hearing aids) Speech Info: Adequate    SPECIAL CARE FACTORS FREQUENCY  PT (By licensed PT), OT (By licensed OT)     PT Frequency: 5x week OT Frequency: 5x week            Contractures      Additional Factors Info  Code Status, Allergies Code Status Info:  (Full) Allergies Info:  (Codeine, Meperidine, Oxycodone)            Current Medications (03/29/2020):  This is the current hospital active medication list Current Facility-Administered Medications  Medication Dose Route Frequency Provider Last Rate Last Admin  . acetaminophen (TYLENOL) tablet 1,000 mg  1,000 mg Oral Q8H Gonfa, Taye T, MD   1,000 mg at 03/29/20 1338  . amLODipine (NORVASC) tablet 10 mg  10 mg Oral Daily Wendee Beavers T, MD   10 mg at 03/29/20 0857  . calcitonin (salmon) (MIACALCIN/FORTICAL) nasal spray 1 spray  1 spray Alternating Nares Daily Wendee Beavers T, MD   1 spray at 03/29/20 0900  . docusate sodium (COLACE) capsule 100 mg  100 mg Oral Daily Wendee Beavers T, MD   100 mg at 03/29/20 0858  . dorzolamide (TRUSOPT) 2 % ophthalmic solution 1 drop  1 drop Both Eyes BID Kyle, Tyrone A, DO   1 drop at 03/29/20 0859  . hydrochlorothiazide (MICROZIDE) capsule 12.5 mg  12.5 mg Oral Daily Wendee Beavers T, MD   12.5 mg at 03/29/20 1338  . latanoprost (XALATAN) 0.005 % ophthalmic solution 1 drop  1 drop Both Eyes QHS Kyle, Tyrone A, DO   1 drop at 03/28/20 2050  . losartan (COZAAR) tablet 50 mg  50 mg Oral Daily Kyle, Tyrone A, DO   50 mg at 03/29/20 0857  . methocarbamol (ROBAXIN) 500 mg in dextrose 5 % 50 mL IVPB  500 mg Intravenous Q6H PRN Marylyn Ishihara, Tyrone A, DO      .  ondansetron (ZOFRAN) tablet 4 mg  4 mg Oral Q6H PRN Marylyn Ishihara, Tyrone A, DO       Or  . ondansetron (ZOFRAN) injection 4 mg  4 mg Intravenous Q6H PRN Kyle, Tyrone A, DO      . oxyCODONE (Oxy IR/ROXICODONE) immediate release tablet 5 mg  5 mg Oral Q8H PRN Wendee Beavers T, MD   5 mg at 03/29/20 1041  . polyethylene glycol (MIRALAX / GLYCOLAX) packet 17 g  17 g Oral BID PRN Wendee Beavers T, MD      . rOPINIRole (REQUIP) tablet 0.25 mg  0.25 mg Oral TID PRN Marylyn Ishihara, Tyrone A, DO   0.25 mg at 03/29/20 0438  . senna (SENOKOT) tablet 8.6 mg  1 tablet Oral Daily PRN Wendee Beavers T, MD      . traMADol (ULTRAM) tablet 50 mg  50 mg Oral Q8H PRN Wendee Beavers T, MD      . warfarin (COUMADIN) tablet 1 mg  1 mg Oral Q48H  Kyle, Tyrone A, DO   1 mg at 03/28/20 1605  . warfarin (COUMADIN) tablet 1.5 mg  1.5 mg Oral Q48H Kyle, Tyrone A, DO      . Warfarin - Physician Dosing Inpatient   Does not apply q1600 Minda Ditto, [facility]   Given at 03/28/20 1610     Discharge Medications: Please see discharge summary for a list of discharge medications.  Relevant Imaging Results:  Relevant Lab Results:   Additional Information SS#[phone]  Rollin Kotowski, Juliann Pulse, RN

## 2020-03-29 NOTE — Progress Notes (Signed)
PROGRESS NOTE  Danielle Thomas IPJ:[id number] DOB: 02-Feb-1928   PCP: Cari Caraway, MD  Patient is from: Home  DOA: 03/27/2020 LOS: 2  Chief complaints: Mild pain and diarrhea  Brief Narrative / Interim history: 86 year old female with history of A. fib on warfarin and HTN presenting with acute back pain for 2 to 3 days, constipation for 1 week followed by diarrhea and poor p.o. intake.  She was admitted for significant electrolyte derangement with hypokalemia, hypocalcemia, hypomagnesemia and acute T11 compression fracture with minimal vertebral body height loss less than 20% and minimal bone retropulsion along the posterior aspect of the T11 vertebral body.  CT abdomen and pelvis also revealed incidental high-grade stenosis (70%) involving the origin of SMA and 60 to 70% stenosis at the origin of the celiac trunk.   Therapy recommended SNF.   Subjective: Seen and examined earlier this morning.  No major events overnight of this morning.  Reports some improvement in her back pain.  No focal neuro deficits or symptoms. Feels some bandlike tightness around her abdomen, mainly after eating.  Objective: Vitals:   03/28/20 1308 03/28/20 2023 03/29/20 0451 03/29/20 0857  BP: (!) 147/81 130/77 (!) 156/86 (!) 167/92  Pulse: 73 (!) 48 67   Resp: 18 20 (!) 21   Temp: 98.6 F (37 C) (!) 97.5 F (36.4 C) 97.9 F (36.6 C)   TempSrc: Oral Oral Oral   SpO2: 96% 95% 96%   Weight:      Height:        Intake/Output Summary (Last 24 hours) at 03/29/2020 1206 Last data filed at 03/29/2020 0900 Gross per 24 hour  Intake 680.41 ml  Output 400 ml  Net 280.41 ml   Filed Weights   03/27/20 1840  Weight: 72.7 kg    Examination: GENERAL: No apparent distress.  Nontoxic. HEENT: MMM.  Vision and hearing grossly intact.  NECK: Supple.  No apparent JVD.  RESP:  No IWOB.  Fair aeration bilaterally. CVS:  RRR. Heart sounds normal.  ABD/GI/GU: BS+. Abd soft, NTND.  MSK/EXT:  Moves extremities.  No apparent deformity. No edema.  SKIN: no apparent skin lesion or wound NEURO: Awake, alert and oriented appropriately.  No apparent focal neuro deficit. PSYCH: Calm. Normal affect.  Procedures:  None  Microbiology summarized: BHALP-37, influenza and RSV PCR nonreactive.  Assessment & Plan: Hypokalemia/hypocalcemia/hypomagnesemia: Resolved.  Likely due to diarrhea and concurrent use of HCTZ +/-losartan.  Diarrhea seems to have subsided.  -Continue monitoring  Prolonged QTC?  Difficult to assess QT due to lack of clear T waves as EKG seems to show a flutter. -Repeat twelve-lead EKG -Optimize electrolytes  Acute T11 compression fracture: acute T11 compression fracture with minimal vertebral body height loss less than 20% and minimal bone retropulsion along the posterior aspect of the T11 vertebral body.  Per admitting provider, EDP discussed with Ortho who recommended TLSO brace if she tolerates. -Outpatient follow-up with orthopedic surgery. -Pain control with scheduled Tylenol, as needed Robaxin, tramadol and oxycodone -Calcitonin nasal spray -TLSO brace as tolerated  SMA and celiac trunk stenosis 70% stenosis involving the origin of SMA and celiac trunk.  No signs of bowel ischemia. -Outpatient follow-up with vascular surgery  Paroxysmal A. Fib: EKG appears to show a flutter with good rate control.  On warfarin for anticoagulation.  INR therapeutic. -Continue warfarin -Optimize electrolytes  Essential hypertension: BP slightly elevated. -Continue amlodipine 10 mg daily -Continue losartan 50 mg daily -Resume HCTZ.  Debility/physical deconditioning: -PT/OT recommended SNF.  Family  in agreement.  Diarrhea/constipation: Seems to have resolved.  Body mass index is 28.39 kg/m.         DVT prophylaxis:   warfarin (COUMADIN) tablet 1 mg  warfarin (COUMADIN) tablet 1.5 mg  Code Status: Full code Family Communication: Updated patient's daughter over the phone. Status is:  Inpatient  Remains inpatient appropriate because:Unsafe d/c plan   Dispo: The patient is from: Home              Anticipated d/c is to: SNF              Anticipated d/c date is: 1 day              Patient currently is medically stable to d/c.       Consultants:  Orthopedic surgery by EDP on admission   Sch Meds:  Scheduled Meds: . acetaminophen  1,000 mg Oral Q8H  . amLODipine  10 mg Oral Daily  . calcitonin (salmon)  1 spray Alternating Nares Daily  . docusate sodium  100 mg Oral Daily  . dorzolamide  1 drop Both Eyes BID  . hydrochlorothiazide  12.5 mg Oral Daily  . latanoprost  1 drop Both Eyes QHS  . losartan  50 mg Oral Daily  . warfarin  1 mg Oral Q48H  . warfarin  1.5 mg Oral Q48H  . Warfarin - Physician Dosing Inpatient   Does not apply q1600   Continuous Infusions: . methocarbamol (ROBAXIN) IV     PRN Meds:.methocarbamol (ROBAXIN) IV, ondansetron **OR** ondansetron (ZOFRAN) IV, oxyCODONE, polyethylene glycol, rOPINIRole, senna, traMADol  Antimicrobials: Anti-infectives (From admission, onward)   None       I have personally reviewed the following labs and images: CBC: Recent Labs  Lab 03/27/20 0927 03/28/20 0603 03/29/20 0532  WBC 10.4 8.0 9.0  NEUTROABS 8.6*  --   --   HGB 15.0 14.1 14.2  HCT 45.0 43.1 43.4  MCV 89.6 90.5 91.6  PLT 225 217 223   BMP &GFR Recent Labs  Lab 03/27/20 0927 03/27/20 1050 03/27/20 2047 03/28/20 0603 03/29/20 0532  NA 139  --  136 140 136  K 2.0*  --  3.6 3.4* 4.5  CL 106  --  102 104 103  CO2 25  --  25 26 25   GLUCOSE 113*  --  141* 106* 131*  BUN 12  --  14 18 27*  CREATININE 0.45  --  0.75 0.74 0.86  CALCIUM 6.2*  --  7.9* 8.1* 8.2*  MG  --  1.5*  --  2.3 2.2  PHOS  --   --  1.9*  --  2.2*   Estimated Creatinine Clearance: 41.5 mL/min (by C-G formula based on SCr of 0.86 mg/dL). Liver & Pancreas: Recent Labs  Lab 03/27/20 0927 03/27/20 2047 03/28/20 0603 03/29/20 0532  AST 15  --  22  --     ALT 11  --  16  --   ALKPHOS 37*  --  40  --   BILITOT 0.7  --  0.9  --   PROT 4.9*  --  5.8*  --   ALBUMIN 2.8* 3.2* 3.0* 3.2*   Recent Labs  Lab 03/27/20 0927  LIPASE 22   No results for input(s): AMMONIA in the last 168 hours. Diabetic: No results for input(s): HGBA1C in the last 72 hours. No results for input(s): GLUCAP in the last 168 hours. Cardiac Enzymes: No results for input(s): CKTOTAL, CKMB, CKMBINDEX, TROPONINI in the  last 168 hours. No results for input(s): PROBNP in the last 8760 hours. Coagulation Profile: Recent Labs  Lab 03/27/20 1030 03/28/20 0603 03/29/20 0532  INR 3.1* 2.8* 2.7*   Thyroid Function Tests: No results for input(s): TSH, T4TOTAL, FREET4, T3FREE, THYROIDAB in the last 72 hours. Lipid Profile: No results for input(s): CHOL, HDL, LDLCALC, TRIG, CHOLHDL, LDLDIRECT in the last 72 hours. Anemia Panel: No results for input(s): VITAMINB12, FOLATE, FERRITIN, TIBC, IRON, RETICCTPCT in the last 72 hours. Urine analysis:    Component Value Date/Time   COLORURINE YELLOW 03/27/2020 1032   APPEARANCEUR CLEAR 03/27/2020 1032   LABSPEC 1.008 03/27/2020 1032   PHURINE 7.0 03/27/2020 1032   GLUCOSEU NEGATIVE 03/27/2020 1032   HGBUR NEGATIVE 03/27/2020 1032   BILIRUBINUR NEGATIVE 03/27/2020 1032   KETONESUR 5 (A) 03/27/2020 1032   PROTEINUR 100 (A) 03/27/2020 1032   NITRITE NEGATIVE 03/27/2020 1032   LEUKOCYTESUR NEGATIVE 03/27/2020 1032   Sepsis Labs: Invalid input(s): PROCALCITONIN, [place]  Microbiology: Recent Results (from the past 240 hour(s))  Resp Panel by RT-PCR (Flu A&B, Covid) Nasopharyngeal Swab     Status: None   Collection Time: 03/27/20 12:20 PM   Specimen: Nasopharyngeal Swab; Nasopharyngeal(NP) swabs in vial transport medium  Result Value Ref Range Status   SARS Coronavirus 2 by RT PCR NEGATIVE NEGATIVE Final    Comment: (NOTE) SARS-CoV-2 target nucleic acids are NOT DETECTED.  The SARS-CoV-2 RNA is generally detectable  in upper respiratory specimens during the acute phase of infection. The lowest concentration of SARS-CoV-2 viral copies this assay can detect is 138 copies/mL. A negative result does not preclude SARS-Cov-2 infection and should not be used as the sole basis for treatment or other patient management decisions. A negative result may occur with  improper specimen collection/handling, submission of specimen other than nasopharyngeal swab, presence of viral mutation(s) within the areas targeted by this assay, and inadequate number of viral copies(<138 copies/mL). A negative result must be combined with clinical observations, patient history, and epidemiological information. The expected result is Negative.  Fact Sheet for Patients:  EntrepreneurPulse.com.au  Fact Sheet for Healthcare Providers:  IncredibleEmployment.be  This test is no t yet approved or cleared by the [place] FDA and  has been authorized for detection and/or diagnosis of SARS-CoV-2 by FDA under an Emergency Use Authorization (EUA). This EUA will remain  in effect (meaning this test can be used) for the duration of the COVID-19 declaration under Section 564(b)(1) of the Act, 21 U.S.C.section 360bbb-3(b)(1), unless the authorization is terminated  or revoked sooner.       Influenza A by PCR NEGATIVE NEGATIVE Final   Influenza B by PCR NEGATIVE NEGATIVE Final    Comment: (NOTE) The Xpert Xpress SARS-CoV-2/FLU/RSV plus assay is intended as an aid in the diagnosis of influenza from Nasopharyngeal swab specimens and should not be used as a sole basis for treatment. Nasal washings and aspirates are unacceptable for Xpert Xpress SARS-CoV-2/FLU/RSV testing.  Fact Sheet for Patients: EntrepreneurPulse.com.au  Fact Sheet for Healthcare Providers: IncredibleEmployment.be  This test is not yet approved or cleared by the [place] FDA and has  been authorized for detection and/or diagnosis of SARS-CoV-2 by FDA under an Emergency Use Authorization (EUA). This EUA will remain in effect (meaning this test can be used) for the duration of the COVID-19 declaration under Section 564(b)(1) of the Act, 21 U.S.C. section 360bbb-3(b)(1), unless the authorization is terminated or revoked.  Performed at [facility], [address] [place], [place]  [place]     Radiology Studies: No results found.    Landi Biscardi T. [place]  If 7PM-7AM, please contact night-coverage [email or web address] 03/29/2020, 12:06 PM

## 2020-03-29 NOTE — Progress Notes (Signed)
Orthopedic Tech Progress Note Patient Details:  Danielle Thomas 06-22-1927 [id number]  Patient ID: Danielle Thomas, female   DOB: 1928/02/08, 86 y.o.   MRN: [id number]   Danielle Thomas 03/29/2020, 8:40 AMCalled and routed TLSO brace order to [facility].

## 2020-03-29 NOTE — Evaluation (Signed)
Physical Therapy Evaluation Patient Details Name: Danielle Thomas MRN: [id number] DOB: 1927/07/10 Today's Date: 03/29/2020   History of Present Illness  Pt is a 86 yo female admitted with acute back pain  for 2-3 days, constipation followed by diarrhea. Pt found to have T11 compression fx.  MD ordered TLSO for comfort only.  Pt with h/o afib and HTN.  Clinical Impression   Patient received in recliner, very pleasant and cooperative with therapy but anxious about current physical state/possibility of SNF. Able to mobilize on a min guard to MinA basis with RW, but very easily fatigued and limited by SOB with gait training; SpO2 95% and greater on RA, may benefit from formal orthostatic testing next session. Really values her independence, and education was provided on benefits of short term rehab to help improve strength, balance, and functional activity tolerance in order to help her maintain her independence/prevent falls at home. Left up in recliner with all needs met, chair alarm active. Would really benefit from ongoing rehab in SNF setting prior to return home.     Follow Up Recommendations SNF;Supervision/Assistance - 24 hour    Equipment Recommendations  Rolling walker with 5" wheels;3in1 (PT)    Recommendations for Other Services       Precautions / Restrictions Precautions Precautions: Back;Fall Precaution Comments: MD stated TLSO does not have to be up to mobilize/for comfort only Required Braces or Orthoses: Spinal Brace Spinal Brace: Thoracolumbosacral orthotic Restrictions Weight Bearing Restrictions: No      Mobility  Bed Mobility Overal bed mobility: Needs Assistance Bed Mobility: Supine to Sit     Supine to sit: Min assist     General bed mobility comments: OOB in recliner upon entry    Transfers Overall transfer level: Needs assistance Equipment used: Rolling walker (2 wheeled) Transfers: Sit to/from Stand Sit to Stand: Min guard Stand pivot transfers:  Min assist       General transfer comment: min guard with RW, cues for hand placement and light steadying assist for balance once up  Ambulation/Gait Ambulation/Gait assistance: Min guard Gait Distance (Feet): 60 Feet Assistive device: Rolling walker (2 wheeled) Gait Pattern/deviations: Step-through pattern;Decreased step length - right;Decreased step length - left;Decreased stride length;Trunk flexed;Drifts right/left;Wide base of support Gait velocity: decreased   General Gait Details: cues for proximity to RW, gait distance limited by light headedness and SOB but SPO2 95% and greater on RA  Stairs            Wheelchair Mobility    Modified Rankin (Stroke Patients Only)       Balance Overall balance assessment: Needs assistance Sitting-balance support: Feet supported Sitting balance-Leahy Scale: Good     Standing balance support: Bilateral upper extremity supported;During functional activity Standing balance-Leahy Scale: Fair Standing balance comment: benefits from BUE support                             Pertinent Vitals/Pain Pain Assessment: Faces Pain Score: 4  Faces Pain Scale: Hurts little more Pain Location: back Pain Descriptors / Indicators: Aching;Grimacing Pain Intervention(s): Limited activity within patient's tolerance;Monitored during session;Repositioned    Home Living Family/patient expects to be discharged to:: Private residence Living Arrangements: Alone Available Help at Discharge: Family;Available PRN/intermittently Type of Home: House Home Access: Stairs to enter   Entrance Stairs-Number of Steps: 1 Home Layout: Two level Home Equipment: Walker - 2 wheels;Cane - single point Additional Comments: uses cane at times but also has  walker. Commode downstairs in handicap height and one upstairs is not.    Prior Function Level of Independence: Needs assistance   Gait / Transfers Assistance Needed: Used cane at times and sometimes  walker or nothing.  ADL's / Homemaking Assistance Needed: Pt was independent taking care of home care tasks.  Sometimes daughter helped but both daughters work.  Comments: Pt does not want to go to SNF but really needs 24 hour assist for a few days until steadier on her feet.     Hand Dominance   Dominant Hand: Right    Extremity/Trunk Assessment   Upper Extremity Assessment Upper Extremity Assessment: Defer to OT evaluation    Lower Extremity Assessment Lower Extremity Assessment: Generalized weakness    Cervical / Trunk Assessment Cervical / Trunk Assessment: Kyphotic  Communication   Communication: HOH  Cognition Arousal/Alertness: Awake/alert Behavior During Therapy: WFL for tasks assessed/performed;Anxious Overall Cognitive Status: Within Functional Limits for tasks assessed                                 General Comments: appropriate and pleasant with therapist, very anxious about possibility of needing to go to SNF but receptive to education on reasoning why      General Comments General comments (skin integrity, edema, etc.): Pt drives and pays her own bills.  Pt skin intact.  Pt lives alone and does not want to go to SNF but also says her daughters cannot come stay with her all the time.      Exercises     Assessment/Plan    PT Assessment Patient needs continued PT services  PT Problem List Decreased strength;Decreased knowledge of use of DME;Obesity;Decreased activity tolerance;Decreased safety awareness;Decreased balance;Decreased knowledge of precautions;Decreased mobility;Decreased coordination       PT Treatment Interventions DME instruction;Balance training;Gait training;Stair training;Functional mobility training;Patient/family education;Therapeutic activities;Therapeutic exercise    PT Goals (Current goals can be found in the Care Plan section)  Acute Rehab PT Goals Patient Stated Goal: to go home and to my doctor. PT Goal  Formulation: With patient Time For Goal Achievement: 04/12/20 Potential to Achieve Goals: Fair    Frequency Min 3X/week   Barriers to discharge        Co-evaluation               AM-PAC PT "6 Clicks" Mobility  Outcome Measure Help needed turning from your back to your side while in a flat bed without using bedrails?: A Little Help needed moving from lying on your back to sitting on the side of a flat bed without using bedrails?: A Little Help needed moving to and from a bed to a chair (including a wheelchair)?: A Little Help needed standing up from a chair using your arms (e.g., wheelchair or bedside chair)?: A Little Help needed to walk in hospital room?: A Little Help needed climbing 3-5 steps with a railing? : A Lot 6 Click Score: 17    End of Session Equipment Utilized During Treatment: Back brace Activity Tolerance: Patient limited by fatigue Patient left: in chair;with call bell/phone within reach;with chair alarm set Nurse Communication: Mobility status;Other (comment) (ambulatory sats) PT Visit Diagnosis: Unsteadiness on feet (R26.81);Difficulty in walking, not elsewhere classified (R26.2);Muscle weakness (generalized) (M62.81)    Time: 0934-1000 PT Time Calculation (min) (ACUTE ONLY): 26 min   Charges:   PT Evaluation $PT Eval Moderate Complexity: 1 Mod PT Treatments $Gait Training: 8-22 mins  Windell Norfolk, DPT, PN1   Supplemental Physical Therapist [facility]    Pager [phone] Acute Rehab Office [phone]

## 2020-03-29 NOTE — NC FL2 (Signed)
[place] LEVEL OF CARE SCREENING TOOL     IDENTIFICATION  Patient Name: Danielle Thomas Birthdate: [birth year] Sex: female Admission Date (Current Location): 03/27/2020  [facility] and [place] Number:  Herbalist and Address:  [facility],  [place], [place]      Provider Number: [id number]  Attending Physician Name and Address:  Mercy Riding, MD  Relative Name and Phone Number:  Kalman Drape [phone]    Current Level of Care: Hospital Recommended Level of Care: [place] Prior Approval Number:    Date Approved/Denied:   PASRR Number:    Discharge Plan: SNF    Current Diagnoses: Patient Active Problem List   Diagnosis Date Noted   Hypokalemia 03/27/2020   Chest pressure 06/23/2015   Chronic anticoagulation 04/09/2014   Essential hypertension, benign 04/08/2013   Hyperlipidemia 04/08/2013   Atrial fibrillation ([place]) 02/13/2013    Orientation RESPIRATION BLADDER Height & Weight     Self, Time, Situation, Place  Normal Continent Weight: 72.7 kg Height:  5\' 3"  (160 cm)  BEHAVIORAL SYMPTOMS/MOOD NEUROLOGICAL BOWEL NUTRITION STATUS      Continent Diet (Heart Healthy)  AMBULATORY STATUS COMMUNICATION OF NEEDS Skin   Limited Assist Verbally Normal                       Personal Care Assistance Level of Assistance  Bathing, Dressing Bathing Assistance: Limited assistance   Dressing Assistance: Limited assistance     Functional Limitations Info  Hearing, Sight, Speech Sight Info: Impaired (Eyeglasses) Hearing Info: Impaired (Bilateral hearing aids) Speech Info: Adequate    SPECIAL CARE FACTORS FREQUENCY  PT (By licensed PT), OT (By licensed OT)     PT Frequency: 5x week OT Frequency: 5x week            Contractures      Additional Factors Info  Code Status, Allergies Code Status Info:  (Full) Allergies Info:  (Codeine, Meperidine, Oxycodone)            Current Medications (03/29/2020):  This is the current hospital active medication list Current Facility-Administered Medications  Medication Dose Route Frequency Provider Last Rate Last Admin   acetaminophen (TYLENOL) tablet 1,000 mg  1,000 mg Oral Q8H Gonfa, Taye T, MD   1,000 mg at 03/29/20 1338   amLODipine (NORVASC) tablet 10 mg  10 mg Oral Daily Wendee Beavers T, MD   10 mg at 03/29/20 0857   calcitonin ([place]) (MIACALCIN/FORTICAL) nasal spray 1 spray  1 spray Alternating Nares Daily Wendee Beavers T, MD   1 spray at 03/29/20 0900   docusate sodium (COLACE) capsule 100 mg  100 mg Oral Daily Gonfa, Taye T, MD   100 mg at 03/29/20 0858   dorzolamide (TRUSOPT) 2 % ophthalmic solution 1 drop  1 drop Both Eyes BID Marylyn Ishihara, Tyrone A, DO   1 drop at 03/29/20 0859   hydrochlorothiazide (MICROZIDE) capsule 12.5 mg  12.5 mg Oral Daily Gonfa, Taye T, MD   12.5 mg at 03/29/20 1338   latanoprost (XALATAN) 0.005 % ophthalmic solution 1 drop  1 drop Both Eyes QHS Kyle, Tyrone A, DO   1 drop at 03/28/20 2050   losartan (COZAAR) tablet 50 mg  50 mg Oral Daily Kyle, Tyrone A, DO   50 mg at 03/29/20 0857   methocarbamol (ROBAXIN) 500 mg in dextrose 5 % 50 mL IVPB  500 mg Intravenous Q6H PRN Cherylann Ratel A,  DO       ondansetron (ZOFRAN) tablet 4 mg  4 mg Oral Q6H PRN Marylyn Ishihara, Tyrone A, DO       Or   ondansetron (ZOFRAN) injection 4 mg  4 mg Intravenous Q6H PRN Marylyn Ishihara, Tyrone A, DO       oxyCODONE (Oxy IR/ROXICODONE) immediate release tablet 5 mg  5 mg Oral Q8H PRN Wendee Beavers T, MD   5 mg at 03/29/20 1041   polyethylene glycol (MIRALAX / GLYCOLAX) packet 17 g  17 g Oral BID PRN Mercy Riding, MD       rOPINIRole (REQUIP) tablet 0.25 mg  0.25 mg Oral TID PRN Marylyn Ishihara, Tyrone A, DO   0.25 mg at 03/29/20 0438   senna (SENOKOT) tablet 8.6 mg  1 tablet Oral Daily PRN Mercy Riding, MD       traMADol (ULTRAM) tablet 50 mg  50 mg Oral Q8H PRN Wendee Beavers T, MD       warfarin (COUMADIN) tablet 1 mg  1 mg Oral Q48H  Kyle, Tyrone A, DO   1 mg at 03/28/20 1605   warfarin (COUMADIN) tablet 1.5 mg  1.5 mg Oral Q48H Kyle, Tyrone A, DO       Warfarin - Physician Dosing Inpatient   Does not apply q1600 Minda Ditto, [facility]   Given at 03/28/20 1610     Discharge Medications: Please see discharge summary for a list of discharge medications.  Relevant Imaging Results:  Relevant Lab Results:   Additional Information SS#[phone]  Chijioke Lasser, Juliann Pulse, RN

## 2020-03-29 NOTE — Patient Outreach (Signed)
[facility]) Care Management  03/29/2020  Danielle Thomas 10-04-1927 [id number]   Telephone Screen  Referral Date: 03/26/2020 Referral Source: [facility] Nurse Advice Lines Referral Reason: "member in need of help coordinating PCP care for treatment" Insurance: [facility]   Referral received. Noted that patient currently inpatient at this time.      Plan: RN CM will continue to follow for discharge plan/disposition and outreach accordingly.   Enzo Montgomery, RN,BSN,CCM [place] Management Telephonic Care Management Coordinator Direct Phone: [phone] Toll Free: [phone] Fax: [phone]

## 2020-03-29 NOTE — Progress Notes (Signed)
From 1900 to 0145, patient has only voided 150 mL. Completed a bladder scan on patient and patient showed 0 mL urine residual. Patient was also instructed to urinate when she felt the urge, since patient has a Purewick in place. Patient urinated some while nurse was in the room to do the bladder scan.

## 2020-03-29 NOTE — Plan of Care (Signed)
  Problem: Education: Goal: Knowledge of General Education information will improve Description: Including pain rating scale, medication(s)/side effects and non-pharmacologic comfort measures Outcome: Progressing   Problem: Clinical Measurements: Goal: Will remain free from infection Outcome: Progressing   Problem: Nutrition: Goal: Adequate nutrition will be maintained Outcome: Progressing   Problem: Coping: Goal: Level of anxiety will decrease Outcome: Progressing   Problem: Elimination: Goal: Will not experience complications related to bowel motility Outcome: Progressing Goal: Will not experience complications related to urinary retention Outcome: Progressing   Problem: Safety: Goal: Ability to remain free from injury will improve Outcome: Progressing   Problem: Skin Integrity: Goal: Risk for impaired skin integrity will decrease Outcome: Progressing

## 2020-03-29 NOTE — TOC Progression Note (Signed)
Transition of Care [facility]) - Progression Note    Patient Details  Name: Danielle Thomas MRN: [id number] Date of Birth: 03/23/1928  Transition of Care [facility]) CM/SW Contact  Anmol Paschen, Juliann Pulse, RN Phone Number: 03/29/2020, 4:01 PM  Clinical Narrative: initiated Josem Kaufmann for [facility] to SNF WUJ#[id number].Will fax clinicals to [phone].      Expected Discharge Plan: Skilled Nursing Facility Barriers to Discharge: Ship broker  Expected Discharge Plan and Services Expected Discharge Plan: [place]   Discharge Planning Services: CM Consult Post Acute Care Choice: [place] Living arrangements for the past 2 months: Single Family Home                                       Social Determinants of Health (SDOH) Interventions    Readmission Risk Interventions No flowsheet data found.

## 2020-03-29 NOTE — TOC Initial Note (Addendum)
Transition of Care [facility]) - Initial/Assessment Note    Patient Details  Name: Danielle Thomas MRN: [id number] Date of Birth: 09/11/1927  Transition of Care [facility]) CM/SW Contact:    [name], RN Phone Number: 03/29/2020, 2:54 PM  Clinical Narrative:Patient/dtr agree to SNF-faxed out Danielle Thomas preferred does not have a bed-not accepting patients. Await bed offers.  3p-initiated auth. Will await bed offers/choice.                  Expected Discharge Plan: Skilled Nursing Facility Barriers to Discharge: Insurance Authorization   Patient Goals and CMS Choice Patient states their goals for this hospitalization and ongoing recovery are:: go to rehab CMS Medicare.gov Compare Post Acute Care list provided to:: Patient Represenative (must comment) Choice offered to / list presented to : Adult Children, Patient  Expected Discharge Plan and Services Expected Discharge Plan: [place]   Discharge Planning Services: CM Consult Post Acute Care Choice: [place] Living arrangements for the past 2 months: Single Family Home                                      Prior Living Arrangements/Services Living arrangements for the past 2 months: Single Family Home Lives with:: Self   Do you feel safe going back to the place where you live?: Yes        Care giver support system in place?: Yes (comment)   Criminal Activity/Legal Involvement Pertinent to Current Situation/Hospitalization: No - Comment as needed  Activities of Daily Living Home Assistive Devices/Equipment: Cane (specify quad or straight), Walker (specify type) ADL Screening (condition at time of admission) Patient's cognitive ability adequate to safely complete daily activities?: No Is the patient deaf or have difficulty hearing?: Yes Does the patient have difficulty seeing, even when wearing glasses/contacts?: No Does the patient have difficulty concentrating, remembering, or making  decisions?: No Patient able to express need for assistance with ADLs?: Yes Does the patient have difficulty dressing or bathing?: No Independently performs ADLs?: Yes (appropriate for developmental age) Does the patient have difficulty walking or climbing stairs?: Yes Weakness of Legs: Both Weakness of Arms/Hands: Both  Permission Sought/Granted Permission sought to share information with : Case Manager Permission granted to share information with : Yes, Verbal Permission Granted  Share Information with NAME: Case Manager     Permission granted to share info w Relationship: Kalman Drape dtr [phone]     Emotional Assessment Appearance:: Appears stated age Attitude/Demeanor/Rapport: Gracious Affect (typically observed): Accepting Orientation: : Oriented to Self, Oriented to  Time, Oriented to Place, Oriented to Situation Alcohol / Substance Use: Not Applicable Psych Involvement: No (comment)  Admission diagnosis:  Hypocalcemia [E83.51] Hypokalemia [E87.6] Hypomagnesemia [E83.42] Diarrhea, unspecified type [R19.7] Patient Active Problem List   Diagnosis Date Noted  . Hypokalemia 03/27/2020  . Chest pressure 06/23/2015  . Chronic anticoagulation 04/09/2014  . Essential hypertension, benign 04/08/2013  . Hyperlipidemia 04/08/2013  . Atrial fibrillation ([place]) 02/13/2013   PCP:  Cari Caraway, MD Pharmacy:   [place], [place] - [address] [place] [place] [address] Phone: [phone] Fax: [phone]     Social Determinants of Health (SDOH) Interventions    Readmission Risk Interventions No flowsheet data found.

## 2020-03-29 NOTE — Evaluation (Signed)
Occupational Therapy Evaluation Patient Details Name: Danielle Thomas MRN: [id number] DOB: 1927/09/23 Today's Date: 03/29/2020    History of Present Illness Pt is a 86 yo female admitted with acute back pain  for 2-3 days, constipation followed by diarrhea. Pt found to have T11 compression fx.  MD ordered TLSO for comfort only.  Pt with h/o afib and HTN.   Clinical Impression   Pt admitted with the above diagnosis and has the deficits listed below. Pt would benefit from cont OT to increase safety on her feet during all adls to reduce falls during adls. Pt lives alone and has daughters that can check on her.  Pt's daughters both work full time and pt states they cannot stay with her.  If pt can find someone to stay with her around the clock for a couple of weeks, feel she could d/c home instead of SNF.  If pt does not have this, feel SNF will be best option.      Follow Up Recommendations  SNF;Supervision/Assistance - 24 hour    Equipment Recommendations  3 in 1 bedside commode    Recommendations for Other Services       Precautions / Restrictions Precautions Precautions: Back;Fall Precaution Comments: MD stated TLSO does not have to be up to mobilize. Required Braces or Orthoses: Spinal Brace Spinal Brace: Thoracolumbosacral orthotic Restrictions Weight Bearing Restrictions: No      Mobility Bed Mobility Overal bed mobility: Needs Assistance Bed Mobility: Supine to Sit     Supine to sit: Min assist     General bed mobility comments: min assist to get to full sitting position.    Transfers Overall transfer level: Needs assistance Equipment used: 1 person hand held assist Transfers: Sit to/from [facility] Sit to Stand: Min assist Stand pivot transfers: Min assist       General transfer comment: pt wanting to grab to items around her and states she is afraid to stand.     Balance Overall balance assessment: Needs assistance Sitting-balance  support: Feet supported Sitting balance-Leahy Scale: Good     Standing balance support: Bilateral upper extremity supported;During functional activity Standing balance-Leahy Scale: Fair Standing balance comment: Pt requires outside assist to stand.                           ADL either performed or assessed with clinical judgement   ADL Overall ADL's : Needs assistance/impaired Eating/Feeding: Independent;Sitting   Grooming: Wash/dry hands;Wash/dry face;Min guard;Standing;Cueing for compensatory techniques Grooming Details (indicate cue type and reason): Pt leans over sink and props for adls.  Explained the need to stand up straight to groom just to follow back precautions. Upper Body Bathing: Set up;Sitting   Lower Body Bathing: Minimal assistance;Sit to/from stand;Cueing for compensatory techniques;Adhering to back precautions Lower Body Bathing Details (indicate cue type and reason): assist when standing to wash bottom and between legs. Pt can reach feet by sitting in figure 4 position. Upper Body Dressing : Set up;Sitting Upper Body Dressing Details (indicate cue type and reason): may need assist with TLSO when delivered. Lower Body Dressing: Minimal assistance;Sit to/from stand;Cueing for compensatory techniques Lower Body Dressing Details (indicate cue type and reason): Pt requires assist in standing to safely pull pants up and fasten. Toilet Transfer: Minimal Production assistant, radio Details (indicate cue type and reason): Pt pivoted to [place]- Clothing Manipulation and Hygiene: Minimal assistance;Sit to/from stand;Cueing for compensatory techniques Toileting - Clothing Manipulation  Details (indicate cue type and reason): min assist to adequately clean self.     Functional mobility during ADLs: Minimal assistance;Rolling walker General ADL Comments: Pt does well with adls in sitting.  When up on feet, pt requires some amount of assist from  min assist to supervision at times.  Feel once she has TLSO, things may be more difficult.     Vision Baseline Vision/History: No visual deficits Patient Visual Report: No change from baseline Vision Assessment?: No apparent visual deficits     Perception Perception Perception Tested?: No   Praxis Praxis Praxis tested?: Within functional limits    Pertinent Vitals/Pain Pain Assessment: Faces Pain Score: 4  Faces Pain Scale: Hurts little more Pain Location: back Pain Descriptors / Indicators: Aching;Grimacing Pain Intervention(s): Limited activity within patient's tolerance;Monitored during session;Repositioned;Patient requesting pain meds-RN notified     Hand Dominance Right   Extremity/Trunk Assessment Upper Extremity Assessment Upper Extremity Assessment: Overall WFL for tasks assessed   Lower Extremity Assessment Lower Extremity Assessment: Defer to PT evaluation   Cervical / Trunk Assessment Cervical / Trunk Assessment: Kyphotic   Communication Communication Communication: HOH   Cognition Arousal/Alertness: Awake/alert Behavior During Therapy: WFL for tasks assessed/performed Overall Cognitive Status: Within Functional Limits for tasks assessed                                 General Comments: Pt appears to be fairly cognitively intace.  Not sure if her insight is impaired/judegement or if she just wants to go home instead of SNF therefore says she can handle being home alone.   General Comments  Pt drives and pays her own bills.  Pt skin intact.  Pt lives alone and does not want to go to SNF but also says her daughters cannot come stay with her all the time.      Exercises     Shoulder Instructions      Home Living Family/patient expects to be discharged to:: Private residence Living Arrangements: Alone Available Help at Discharge: Family;Available PRN/intermittently Type of Home: House Home Access: Stairs to enter Entrance Stairs-Number of  Steps: 1   Home Layout: Two level Alternate Level Stairs-Number of Steps: 13 Alternate Level Stairs-Rails: Right Bathroom Shower/Tub: Tub/shower unit;Curtain   Bathroom Toilet: Handicapped height     Home Equipment: Environmental consultant - 2 wheels;Cane - single point   Additional Comments: uses cane at times but also has walker. Commode downstairs in handicap height and one upstairs is not.      Prior Functioning/Environment Level of Independence: Needs assistance  Gait / Transfers Assistance Needed: Used cane at times and sometimes walker or nothing. ADL's / Homemaking Assistance Needed: Pt was independent taking care of home care tasks.  Sometimes daughter helped but both daughters work.   Comments: Pt does not want to go to SNF but really needs 24 hour assist for a few days until steadier on her feet.        OT Problem List: Decreased activity tolerance;Impaired balance (sitting and/or standing);Decreased safety awareness;Decreased knowledge of use of DME or AE;Pain      OT Treatment/Interventions:      OT Goals(Current goals can be found in the care plan section) Acute Rehab OT Goals Patient Stated Goal: to go home and to my doctor. OT Goal Formulation: With patient Time For Goal Achievement: 04/12/20 ADL Goals Pt Will Perform Grooming: with supervision;standing Pt Will Perform Lower Body Bathing: with modified  independence;sit to/from stand Pt Will Perform Lower Body Dressing: with modified independence;sit to/from stand Pt Will Perform Tub/Shower Transfer: with supervision;ambulating;shower seat Additional ADL Goal #1: Pt will walk to bathroom with 3:1 over commode and complete all toileting with use of walker only.  OT Frequency: Min 2X/week   Barriers to D/C: Decreased caregiver support  Pt lives alone. Daughters not always there. They both work full time.       Co-evaluation              AM-PAC OT "6 Clicks" Daily Activity     Outcome Measure Help from another  person eating meals?: None Help from another person taking care of personal grooming?: None Help from another person toileting, which includes using toliet, bedpan, or urinal?: A Little Help from another person bathing (including washing, rinsing, drying)?: A Little Help from another person to put on and taking off regular upper body clothing?: None Help from another person to put on and taking off regular lower body clothing?: A Little 6 Click Score: 21   End of Session Nurse Communication: Mobility status  Activity Tolerance: Patient limited by pain Patient left: in chair;with call bell/phone within reach;with chair alarm set  OT Visit Diagnosis: Unsteadiness on feet (R26.81);Pain Pain - part of body:  (back)                Time: 0818-0900 OT Time Calculation (min): 42 min Charges:  OT General Charges $OT Visit: 1 Visit OT Evaluation $OT Eval Moderate Complexity: 1 Mod OT Treatments $Self Care/Home Management : 8-22 mins  Glenford Peers 03/29/2020, 9:26 AM

## 2020-03-30 ENCOUNTER — Other Ambulatory Visit: Payer: Self-pay

## 2020-03-30 DIAGNOSIS — F05 Delirium due to known physiological condition: Secondary | ICD-10-CM | POA: Diagnosis not present

## 2020-03-30 DIAGNOSIS — I10 Essential (primary) hypertension: Secondary | ICD-10-CM | POA: Diagnosis not present

## 2020-03-30 DIAGNOSIS — I4819 Other persistent atrial fibrillation: Secondary | ICD-10-CM | POA: Diagnosis not present

## 2020-03-30 DIAGNOSIS — R52 Pain, unspecified: Secondary | ICD-10-CM | POA: Diagnosis not present

## 2020-03-30 DIAGNOSIS — S22080A Wedge compression fracture of T11-T12 vertebra, initial encounter for closed fracture: Secondary | ICD-10-CM | POA: Diagnosis not present

## 2020-03-30 DIAGNOSIS — I774 Celiac artery compression syndrome: Secondary | ICD-10-CM | POA: Diagnosis not present

## 2020-03-30 DIAGNOSIS — M545 Low back pain, unspecified: Secondary | ICD-10-CM | POA: Diagnosis not present

## 2020-03-30 DIAGNOSIS — M2550 Pain in unspecified joint: Secondary | ICD-10-CM | POA: Diagnosis not present

## 2020-03-30 DIAGNOSIS — S22080D Wedge compression fracture of T11-T12 vertebra, subsequent encounter for fracture with routine healing: Secondary | ICD-10-CM | POA: Diagnosis not present

## 2020-03-30 DIAGNOSIS — R262 Difficulty in walking, not elsewhere classified: Secondary | ICD-10-CM | POA: Diagnosis not present

## 2020-03-30 DIAGNOSIS — M546 Pain in thoracic spine: Secondary | ICD-10-CM | POA: Diagnosis not present

## 2020-03-30 DIAGNOSIS — G8929 Other chronic pain: Secondary | ICD-10-CM | POA: Diagnosis not present

## 2020-03-30 DIAGNOSIS — K551 Chronic vascular disorders of intestine: Secondary | ICD-10-CM | POA: Diagnosis not present

## 2020-03-30 DIAGNOSIS — E876 Hypokalemia: Secondary | ICD-10-CM | POA: Diagnosis not present

## 2020-03-30 DIAGNOSIS — R451 Restlessness and agitation: Secondary | ICD-10-CM | POA: Diagnosis not present

## 2020-03-30 DIAGNOSIS — I484 Atypical atrial flutter: Secondary | ICD-10-CM | POA: Diagnosis not present

## 2020-03-30 DIAGNOSIS — I480 Paroxysmal atrial fibrillation: Secondary | ICD-10-CM | POA: Diagnosis not present

## 2020-03-30 DIAGNOSIS — F411 Generalized anxiety disorder: Secondary | ICD-10-CM | POA: Diagnosis not present

## 2020-03-30 DIAGNOSIS — R5381 Other malaise: Secondary | ICD-10-CM | POA: Diagnosis not present

## 2020-03-30 DIAGNOSIS — Z7401 Bed confinement status: Secondary | ICD-10-CM | POA: Diagnosis not present

## 2020-03-30 DIAGNOSIS — R0902 Hypoxemia: Secondary | ICD-10-CM | POA: Diagnosis not present

## 2020-03-30 DIAGNOSIS — M6281 Muscle weakness (generalized): Secondary | ICD-10-CM | POA: Diagnosis not present

## 2020-03-30 LAB — RENAL FUNCTION PANEL
Albumin: 3.4 g/dL — ABNORMAL LOW (ref 3.5–5.0)
Anion gap: 7 (ref 5–15)
BUN: 20 mg/dL (ref 8–23)
CO2: 28 mmol/L (ref 22–32)
Calcium: 8.5 mg/dL — ABNORMAL LOW (ref 8.9–10.3)
Chloride: 102 mmol/L (ref 98–111)
Creatinine, Ser: 0.75 mg/dL (ref 0.44–1.00)
GFR, Estimated: >60 mL/min (ref >60)
Glucose, Bld: 112 mg/dL — ABNORMAL HIGH (ref 70–99)
Phosphorus: 2.8 mg/dL (ref 2.5–4.6)
Potassium: 4.3 mmol/L (ref 3.5–5.1)
Sodium: 137 mmol/L (ref 135–145)

## 2020-03-30 LAB — CBC
HCT: 45.7 % (ref 36.0–46.0)
Hemoglobin: 14.9 g/dL (ref 12.0–15.0)
MCH: 29.7 pg (ref 26.0–34.0)
MCHC: 32.6 g/dL (ref 30.0–36.0)
MCV: 91 fL (ref 80.0–100.0)
Platelets: 234 10*3/uL (ref 150–400)
RBC: 5.02 MIL/uL (ref 3.87–5.11)
RDW: 14.6 % (ref 11.5–15.5)
WBC: 7.7 10*3/uL (ref 4.0–10.5)
nRBC: 0 % (ref 0.0–0.2)

## 2020-03-30 LAB — RESP PANEL BY RT-PCR (FLU A&B, COVID) ARPGX2
Influenza A by PCR: NEGATIVE
Influenza B by PCR: NEGATIVE
SARS Coronavirus 2 by RT PCR: NEGATIVE

## 2020-03-30 LAB — PROTIME-INR
INR: 2.8 — ABNORMAL HIGH (ref 0.8–1.2)
Prothrombin Time: 28.7 seconds — ABNORMAL HIGH (ref 11.4–15.2)

## 2020-03-30 LAB — MAGNESIUM: Magnesium: 2 mg/dL (ref 1.7–2.4)

## 2020-03-30 MED ORDER — LOSARTAN POTASSIUM 50 MG PO TABS
100.0000 mg | ORAL_TABLET | Freq: Every day | ORAL | Status: DC
  Administered 2020-03-30: 100 mg via ORAL
  Filled 2020-03-30: qty 2

## 2020-03-30 MED ORDER — OXYCODONE HCL 5 MG PO TABS: 5.0000 mg | ORAL_TABLET | Freq: Three times a day (TID) | ORAL | 0 refills | Status: AC | PRN

## 2020-03-30 MED ORDER — ACETAMINOPHEN 500 MG PO TABS: 1000.0000 mg | ORAL_TABLET | Freq: Three times a day (TID) | ORAL | 0 refills | Status: AC

## 2020-03-30 MED ORDER — POLYETHYLENE GLYCOL 3350 17 G PO PACK: 17.0000 g | PACK | Freq: Two times a day (BID) | ORAL | 0 refills | Status: DC | PRN

## 2020-03-30 MED ORDER — TRAMADOL HCL 50 MG PO TABS: 50.0000 mg | ORAL_TABLET | Freq: Three times a day (TID) | ORAL | 0 refills | Status: AC | PRN

## 2020-03-30 MED ORDER — AMLODIPINE BESYLATE 10 MG PO TABS
10.0000 mg | ORAL_TABLET | Freq: Every day | ORAL | Status: DC
Start: 2020-03-31 — End: 2020-05-13

## 2020-03-30 MED ORDER — SENNOSIDES-DOCUSATE SODIUM 8.6-50 MG PO TABS: 1.0000 | ORAL_TABLET | Freq: Two times a day (BID) | ORAL | 0 refills | Status: DC | PRN

## 2020-03-30 NOTE — Consult Note (Signed)
   [facility] Inpatient Consult   03/30/2020  SOPHIE TAMEZ [birth year] [id number]  [place] Organization [ACO] Patient: [facility] Medicare  Patient had contacted the  [place].  Chart reviewed for disposition and needs and found patient is currently transitioning to a skilled nursing facility.   Patient current transition of care needs to be met at the skilled nursing facility.   Of note, [facility] Care Management services does not replace or interfere with any services that are arranged by inpatient case management or social work.    For additional questions or referrals please contact:    Natividad Brood, RN BSN [facility] Liaison  [phone] business mobile phone Toll free office [phone]  Fax number: [phone] [email or web address]  [email or web address]

## 2020-03-30 NOTE — Care Management Important Message (Signed)
Important Message  Patient Details IM Letter given to the Patient. Name: LATEEFA CROSBY MRN: [id number] Date of Birth: 11-09-1927   Medicare Important Message Given:  Yes     Kerin Salen 03/30/2020, 11:24 AM

## 2020-03-30 NOTE — Discharge Summary (Signed)
Physician Discharge Summary  Danielle Thomas ZDG:[id number] DOB: [birth year] DOA: 03/27/2020  PCP: Cari Caraway, MD  Admit date: 03/27/2020 Discharge date: 03/30/2020  Admitted From: Home Disposition: SNF  Recommendations for Outpatient Follow-up:  1. Follow ups as below. 2. Please obtain CBC/BMP/Mag at follow up 3. Outpatient follow-up with orthopedic surgery in 1 to 2 weeks 4. Ambulatory referral to vascular surgery-ordered. 5. Please follow up on the following pending results: None   Discharge Condition: Stable CODE STATUS: Full code   Contact information for follow-up providers    Dorna Leitz, MD. Schedule an appointment as soon as possible for a visit in 1 week(s).   Specialty: Orthopedic Surgery Contact information: [place] [address] [phone]            Contact information for after-discharge care    Destination    [facility] Preferred SNF .   Service: Skilled Nursing Contact information: [address] [place] Hospital Course: 86 year old female with history of A. fib on warfarin and HTN presenting with acute back pain for 2 to 3 days, constipation for 1 week followed by diarrhea and poor p.o. intake.  She was admitted for significant electrolyte derangement with hypokalemia, hypocalcemia, hypomagnesemia and acute T11 compression fracture with minimal vertebral body height loss less than 20% and minimal bone retropulsion along the posterior aspect of the T11 vertebral body.  CT abdomen and pelvis also revealed incidental high-grade stenosis (70%) involving the origin of SMA and 60 to 70% stenosis at the origin of the celiac trunk.   Electrolyte abnormalities resolved.  Pain improved with pain medication and TLSO brace.  She was discharged on scheduled Tylenol, and as needed tramadol and oxycodone based on pain scale.  Recommend follow-up with her orthopedic  surgeon in 1 to 2 weeks.  In regards to vascular issues, ambulatory referral to vascular surgery ordered.  Patient is discharged to SNF as recommended by therapy.  Discharge Diagnoses:  Acute T11 compression fracture: acute T11 compression fracture with minimal vertebral body height loss less than 20% and minimal bone retropulsion along the posterior aspect of the T11 vertebral body.  Per admitting provider, EDP discussed with Ortho who recommended TLSO brace if she tolerates. -Outpatient follow-up with orthopedic surgery in 1 to 2 weeks. -Pain control with scheduled Tylenol, as needed tramadol and oxycodone with bowel regimen -TLSO brace as tolerated -Continue physical therapy at SNF.  SMA and celiac trunk stenosis 70% stenosis involving the origin of SMA and celiac trunk.  No signs of bowel ischemia. -Ambulatory referral to vascular surgery ordered.  Paroxysmal A. Fib: EKG appears to show a flutter with good rate control.  On warfarin for anticoagulation.  INR therapeutic. -Alternating 1 mg and 1.5 mg warfarin daily starting with 1 mg tonight (03/30/2020) -Recheck INR in 1 week.  Hypokalemia/hypocalcemia/hypomagnesemia: Resolved.  Likely due to diarrhea and concurrent use of HCTZ +/-losartan.  Diarrhea resolved. -Recheck electrolytes in 1 week  Prolonged QTC: Resolved. -Optimize electrolytes  Essential hypertension: BP slightly elevated.  Could be due to pain. -Pain control as above. -Continue amlodipine, losartan and HCTZ.  Debility/physical deconditioning: -Continue therapy at SNF.  Diarrhea/constipation: Resolved.  Body mass index is 28.39 kg/m.            Discharge Exam: Vitals:   03/29/20 2059 03/30/20 0538  BP: (!) 161/94 (!) 168/103  Pulse: 82 71  Resp:  18 18  Temp: 97.8 F (36.6 C) 97.9 F (36.6 C)  SpO2: 99% 97%    GENERAL: No apparent distress.  Nontoxic. HEENT: MMM.  Vision and hearing grossly intact.  NECK: Supple.  No apparent JVD.    RESP: On room air.  No IWOB.  Fair aeration bilaterally. CVS:  RRR. Heart sounds normal.  ABD/GI/GU: Bowel sounds present. Soft. Non tender.  MSK/EXT:  Moves extremities. No apparent deformity. No edema.  SKIN: no apparent skin lesion or wound NEURO: Awake, alert and oriented appropriately.  No apparent focal neuro deficit. PSYCH: Calm. Normal affect.  Discharge Instructions  Discharge Instructions    Ambulatory referral to Vascular Surgery   Complete by: As directed    SMA and Celiac artery stenosis   Diet - low sodium heart healthy   Complete by: As directed    Increase activity slowly   Complete by: As directed      Allergies as of 03/30/2020      Reactions   Codeine Nausea And Vomiting   Meperidine Nausea Only   Oxycodone Nausea And Vomiting      Medication List    TAKE these medications   acetaminophen 500 MG tablet Commonly known as: TYLENOL Take 2 tablets (1,000 mg total) by mouth every 8 (eight) hours for 10 days.   amLODipine 10 MG tablet Commonly known as: NORVASC Take 1 tablet (10 mg total) by mouth daily. Start taking on: March 31, 2020 What changed:   medication strength  how much to take   dorzolamide 2 % ophthalmic solution Commonly known as: TRUSOPT Place 1 drop into both eyes 2 (two) times daily.   hydrochlorothiazide 12.5 MG capsule Commonly known as: MICROZIDE Take 1 capsule (12.5 mg total) by mouth daily. *Please call and schedule a one year follow up appointment*   losartan 50 MG tablet Commonly known as: COZAAR Take 50 mg by mouth daily.   oxyCODONE 5 MG immediate release tablet Commonly known as: Oxy IR/ROXICODONE Take 1 tablet (5 mg total) by mouth every 8 (eight) hours as needed for up to 5 days for severe pain or breakthrough pain.   polyethylene glycol 17 g packet Commonly known as: MIRALAX / GLYCOLAX Take 17 g by mouth 2 (two) times daily as needed for mild constipation (use first).   rOPINIRole 0.25 MG tablet Commonly  known as: REQUIP Take 0.25 mg by mouth 3 (three) times daily as needed. Restless leg   senna-docusate 8.6-50 MG tablet Commonly known as: Senokot-S Take 1 tablet by mouth 2 (two) times daily between meals as needed for mild constipation.   traMADol 50 MG tablet Commonly known as: ULTRAM Take 1 tablet (50 mg total) by mouth every 8 (eight) hours as needed for up to 7 days for moderate pain (mild pain).   travoprost (benzalkonium) 0.004 % ophthalmic solution Commonly known as: TRAVATAN Place 1 drop into both eyes at bedtime.   warfarin 3 MG tablet Commonly known as: COUMADIN Take 1.5 mg by mouth See admin instructions. Patient alternates with 1 mg every other day   warfarin 1 MG tablet Commonly known as: COUMADIN Take 1 mg by mouth See admin instructions. Patient alternates with 1/2 tablet = 1.5mg  every other day.       Consultations:  Orthopedic surgery over the phone.  Procedures/Studies:   CT Abdomen Pelvis W Contrast  Result Date: 03/27/2020 CLINICAL DATA:  Upper abdominal and back pain. Suspected bowel obstruction. EXAM: CT ABDOMEN AND PELVIS WITH CONTRAST TECHNIQUE: Multidetector CT  imaging of the abdomen and pelvis was performed using the standard protocol following bolus administration of intravenous contrast. CONTRAST:  138mL OMNIPAQUE IOHEXOL 300 MG/ML  SOLN COMPARISON:  04/17/2014 FINDINGS: Lower chest: Chronic nodular densities at the lung bases. Largest nodule is at the medial right lower lobe measuring up to 2.1 cm and stable since 2015. Probable postoperative changes with scarring along the anterior right lung base along the right major fissure. Minimal pleural thickening or trace fluid along the medial aspect of both lung bases at the level of T11 vertebral body. Hepatobiliary: Again noted are numerous hypodensities in the liver compatible with cysts. The largest cyst is in the right hepatic lobe and measures up to 7.5 cm. No suspicious liver lesion. Mild to moderate  distention of the gallbladder without surrounding inflammatory changes. No significant extrahepatic biliary dilatation. Pancreas: Unremarkable. No pancreatic ductal dilatation or surrounding inflammatory changes. Spleen: Normal in size without focal abnormality. Adrenals/Urinary Tract: Normal appearance of the adrenal glands. Multiple bilateral renal cysts. 2 mm stone in the right kidney lower pole region without hydronephrosis. Normal appearance of the urinary bladder. Stomach/Bowel: Normal appearance of the stomach. There is no evidence for bowel dilatation or focal bowel inflammation. No evidence for obstruction. Vascular/Lymphatic: Atherosclerotic calcifications involving the abdominal aorta without aneurysm. 60-70% stenosis at the origin of the celiac trunk. High-grade stenosis at the origin of the SMA, greater than 70%. Bilateral renal arteries are patent. Flow in the IMA but limited evaluation of the origin. No lymph node enlargement in the abdomen or pelvis. Reproductive: Status post hysterectomy. No adnexal masses. Other: Small amount of fluid in the pelvis is nonspecific. Negative for free air. Musculoskeletal: Again noted is severe levoscoliosis in the lumbar spine. Both hips are located. Fracture involving the inferior endplate of W80. Minimal vertebral body height loss at T11, less than 20%. Minimal bone retropulsion along the posterior aspect of the T11 vertebral body. Small amount of paraspinal fluid or edema and same area as the pleural thickening or fluid at the lung bases. Findings are suggestive for an acute or subacute compression fracture at T11. IMPRESSION: 1. Compression fracture involving the inferior endplate of H21 with a small amount of paraspinal edema. Findings are suggestive for an acute or subacute compression fracture. Severe scoliosis in the lumbar spine. 2. Small amount of pelvic ascites is nonspecific. Otherwise, no acute abnormalities within the abdomen or pelvis. 3.  Atherosclerotic disease involving the abdominal aorta and visceral arteries. High-grade stenosis involving the origin of the SMA, greater than 70%. 60-70% stenosis involving the origin of the celiac trunk. These vascular findings can be associated with chronic mesenteric ischemia with the appropriate clinical symptoms. 4. Chronic pulmonary nodules at the lung bases. No significant change since 2015 and most compatible with benign findings. 5. Hepatic and renal cysts.  Nonobstructive right kidney stone. 6. Gallbladder distention without surrounding inflammatory changes. Electronically Signed   By: Markus Daft M.D.   On: 03/27/2020 12:05        The results of significant diagnostics from this hospitalization (including imaging, microbiology, ancillary and laboratory) are listed below for reference.     Microbiology: Recent Results (from the past 240 hour(s))  Resp Panel by RT-PCR (Flu A&B, Covid) Nasopharyngeal Swab     Status: None   Collection Time: 03/27/20 12:20 PM   Specimen: Nasopharyngeal Swab; Nasopharyngeal(NP) swabs in vial transport medium  Result Value Ref Range Status   SARS Coronavirus 2 by RT PCR NEGATIVE NEGATIVE Final    Comment: (NOTE)  SARS-CoV-2 target nucleic acids are NOT DETECTED.  The SARS-CoV-2 RNA is generally detectable in upper respiratory specimens during the acute phase of infection. The lowest concentration of SARS-CoV-2 viral copies this assay can detect is 138 copies/mL. A negative result does not preclude SARS-Cov-2 infection and should not be used as the sole basis for treatment or other patient management decisions. A negative result may occur with  improper specimen collection/handling, submission of specimen other than nasopharyngeal swab, presence of viral mutation(s) within the areas targeted by this assay, and inadequate number of viral copies(<138 copies/mL). A negative result must be combined with clinical observations, patient history, and  epidemiological information. The expected result is Negative.  Fact Sheet for Patients:  EntrepreneurPulse.com.au  Fact Sheet for Healthcare Providers:  IncredibleEmployment.be  This test is no t yet approved or cleared by the [place] FDA and  has been authorized for detection and/or diagnosis of SARS-CoV-2 by FDA under an Emergency Use Authorization (EUA). This EUA will remain  in effect (meaning this test can be used) for the duration of the COVID-19 declaration under Section 564(b)(1) of the Act, 21 U.S.C.section 360bbb-3(b)(1), unless the authorization is terminated  or revoked sooner.       Influenza A by PCR NEGATIVE NEGATIVE Final   Influenza B by PCR NEGATIVE NEGATIVE Final    Comment: (NOTE) The Xpert Xpress SARS-CoV-2/FLU/RSV plus assay is intended as an aid in the diagnosis of influenza from Nasopharyngeal swab specimens and should not be used as a sole basis for treatment. Nasal washings and aspirates are unacceptable for Xpert Xpress SARS-CoV-2/FLU/RSV testing.  Fact Sheet for Patients: EntrepreneurPulse.com.au  Fact Sheet for Healthcare Providers: IncredibleEmployment.be  This test is not yet approved or cleared by the [place] FDA and has been authorized for detection and/or diagnosis of SARS-CoV-2 by FDA under an Emergency Use Authorization (EUA). This EUA will remain in effect (meaning this test can be used) for the duration of the COVID-19 declaration under Section 564(b)(1) of the Act, 21 U.S.C. section 360bbb-3(b)(1), unless the authorization is terminated or revoked.  Performed at [facility], [place] [address] [place], [place] [address]      Labs: BNP (last 3 results) No results for input(s): BNP in the last 8760 hours. Basic Metabolic Panel: Recent Labs  Lab 03/27/20 0927 03/27/20 1050 03/27/20 2047 03/28/20 0603 03/29/20 0532  03/30/20 0530  NA 139  --  136 140 136 137  K 2.0*  --  3.6 3.4* 4.5 4.3  CL 106  --  102 104 103 102  CO2 25  --  25 26 25 28   GLUCOSE 113*  --  141* 106* 131* 112*  BUN 12  --  14 18 27* 20  CREATININE 0.45  --  0.75 0.74 0.86 0.75  CALCIUM 6.2*  --  7.9* 8.1* 8.2* 8.5*  MG  --  1.5*  --  2.3 2.2 2.0  PHOS  --   --  1.9*  --  2.2* 2.8   Liver Function Tests: Recent Labs  Lab 03/27/20 0927 03/27/20 2047 03/28/20 0603 03/29/20 0532 03/30/20 0530  AST 15  --  22  --   --   ALT 11  --  16  --   --   ALKPHOS 37*  --  40  --   --   BILITOT 0.7  --  0.9  --   --   PROT 4.9*  --  5.8*  --   --   ALBUMIN  2.8* 3.2* 3.0* 3.2* 3.4*   Recent Labs  Lab 03/27/20 0927  LIPASE 22   No results for input(s): AMMONIA in the last 168 hours. CBC: Recent Labs  Lab 03/27/20 0927 03/28/20 0603 03/29/20 0532 03/30/20 0530  WBC 10.4 8.0 9.0 7.7  NEUTROABS 8.6*  --   --   --   HGB 15.0 14.1 14.2 14.9  HCT 45.0 43.1 43.4 45.7  MCV 89.6 90.5 91.6 91.0  PLT 225 217 223 234   Cardiac Enzymes: No results for input(s): CKTOTAL, CKMB, CKMBINDEX, TROPONINI in the last 168 hours. BNP: Invalid input(s): POCBNP CBG: No results for input(s): GLUCAP in the last 168 hours. D-Dimer No results for input(s): DDIMER in the last 72 hours. Hgb A1c No results for input(s): HGBA1C in the last 72 hours. Lipid Profile No results for input(s): CHOL, HDL, LDLCALC, TRIG, CHOLHDL, LDLDIRECT in the last 72 hours. Thyroid function studies No results for input(s): TSH, T4TOTAL, T3FREE, THYROIDAB in the last 72 hours.  Invalid input(s): FREET3 Anemia work up No results for input(s): VITAMINB12, FOLATE, FERRITIN, TIBC, IRON, RETICCTPCT in the last 72 hours. Urinalysis    Component Value Date/Time   COLORURINE YELLOW 03/27/2020 1032   APPEARANCEUR CLEAR 03/27/2020 1032   LABSPEC 1.008 03/27/2020 1032   PHURINE 7.0 03/27/2020 1032   GLUCOSEU NEGATIVE 03/27/2020 1032   HGBUR NEGATIVE 03/27/2020 1032    BILIRUBINUR NEGATIVE 03/27/2020 1032   KETONESUR 5 (A) 03/27/2020 1032   PROTEINUR 100 (A) 03/27/2020 1032   NITRITE NEGATIVE 03/27/2020 1032   LEUKOCYTESUR NEGATIVE 03/27/2020 1032   Sepsis Labs Invalid input(s): PROCALCITONIN,  WBC,  LACTICIDVEN   Time coordinating discharge: 35 minutes  SIGNED:  Mercy Riding, MD  [facility] 03/30/2020, 11:15 AM  If 7PM-7AM, please contact night-coverage [email or web address]

## 2020-03-30 NOTE — Plan of Care (Signed)
  Problem: Clinical Measurements: Goal: Will remain free from infection 03/30/2020 2109 by Glenna Fellows D, RN Outcome: Completed/Met 03/30/2020 2108 by Glenna Fellows D, RN Outcome: Adequate for Discharge Goal: Diagnostic test results will improve 03/30/2020 2109 by Tula Nakayama, RN Outcome: Completed/Met 03/30/2020 2108 by Tula Nakayama, RN Outcome: Adequate for Discharge   Problem: Activity: Goal: Risk for activity intolerance will decrease 03/30/2020 2109 by Glenna Fellows D, RN Outcome: Completed/Met 03/30/2020 2108 by Glenna Fellows D, RN Outcome: Adequate for Discharge   Problem: Coping: Goal: Level of anxiety will decrease 03/30/2020 2109 by Glenna Fellows D, RN Outcome: Completed/Met 03/30/2020 2108 by Glenna Fellows D, RN Outcome: Adequate for Discharge   Problem: Elimination: Goal: Will not experience complications related to bowel motility 03/30/2020 2109 by Glenna Fellows D, RN Outcome: Completed/Met 03/30/2020 2108 by Glenna Fellows D, RN Outcome: Adequate for Discharge Goal: Will not experience complications related to urinary retention 03/30/2020 2109 by Tula Nakayama, RN Outcome: Completed/Met 03/30/2020 2108 by Glenna Fellows D, RN Outcome: Adequate for Discharge   Problem: Safety: Goal: Ability to remain free from injury will improve 03/30/2020 2109 by Tula Nakayama, RN Outcome: Completed/Met 03/30/2020 2108 by Glenna Fellows D, RN Outcome: Adequate for Discharge   Problem: Skin Integrity: Goal: Risk for impaired skin integrity will decrease 03/30/2020 2109 by Glenna Fellows D, RN Outcome: Completed/Met 03/30/2020 2108 by Tula Nakayama, RN Outcome: Adequate for Discharge

## 2020-03-30 NOTE — TOC Transition Note (Addendum)
Transition of Care [facility]) - CM/SW Discharge Note   Patient Details  Name: Danielle Thomas MRN: [id number] Date of Birth: 1927/11/04  [facility] CM/SW Contact:  [name], RN Phone Number: 03/30/2020, 12:37 PM   Clinical Narrative: d/c today to [facility] have received [id number] to rm#[id number],nsg call report tel#[phone]. Awaiting covid results, prior PTAR for transport.   1:30p-PTAR called for WILL CALL-nsg to call once covid results in. No further CM needs.   Final next level of care: Skilled Nursing Facility Barriers to Discharge: No Barriers Identified   Patient Goals and CMS Choice Patient states their goals for this hospitalization and ongoing recovery are:: go to rehab CMS Medicare.gov Compare Post Acute Care list provided to:: Patient Represenative (must comment) Choice offered to / list presented to : Adult Children, Patient  Discharge Placement PASRR number recieved: 03/25/20            Patient chooses bed at: [facility] Patient to be transferred to facility by: [facility] Name of family member notified: Inez Catalina dtr    [phone] Patient and family notified of of transfer: 03/30/20  Discharge Plan and Services   Discharge Planning Services: CM Consult Post Acute Care Choice: [place]                               Social Determinants of Health (SDOH) Interventions     Readmission Risk Interventions No flowsheet data found.

## 2020-03-30 NOTE — Patient Outreach (Signed)
[place] [facility]) Care Management  03/30/2020  Danielle Thomas 1927/07/05 [id number]   Telephone Screen  Referral Date: 03/26/2020 Referral Source: [facility] Nurse Advice Lines Referral Reason: "member in need of help coordinating PCP care for treatment" Insurance: [employer] Medicare    RN CM received notification from [facility] hospital liaison that patient being discharged to SNF/rehab.   Plan: RN CM will close case at this time.   Enzo Montgomery, RN,BSN,CCM [facility] Telephonic Care Management Coordinator Direct Phone: [phone] Toll Free: [phone] Fax: [phone]

## 2020-03-30 NOTE — Plan of Care (Signed)
  Problem: Clinical Measurements: Goal: Will remain free from infection Outcome: Progressing   Problem: Activity: Goal: Risk for activity intolerance will decrease Outcome: Progressing   Problem: Coping: Goal: Level of anxiety will decrease Outcome: Progressing   Problem: Safety: Goal: Ability to remain free from injury will improve Outcome: Progressing   Problem: Education: Goal: Knowledge of General Education information will improve Description: Including pain rating scale, medication(s)/side effects and non-pharmacologic comfort measures Outcome: Completed/Met   Problem: Nutrition: Goal: Adequate nutrition will be maintained Outcome: Completed/Met

## 2020-03-30 NOTE — Progress Notes (Signed)
Pt taken to [facility] via [place]. Pt with all belongings.  Purewick removed.  No IV present.

## 2020-03-30 NOTE — Progress Notes (Signed)
Physical Therapy Treatment Patient Details Name: Danielle Thomas MRN: [id number] DOB: November 22, 1927 Today's Date: 03/30/2020    History of Present Illness Pt is a 86 yo female admitted with acute back pain  for 2-3 days, constipation followed by diarrhea. Pt found to have T11 compression fx.  MD ordered TLSO for comfort only.  Pt with h/o afib and HTN.    PT Comments    Patient met asleep in room. She mentions that her back has finally stopped hurting after her last med dose at 8AM and doesn't want it to start hurting again, but agrees to therapy. Mod A to get EOB due to back pain. She requires assist with LE's and getting trunk upright. Her TLSO is donned at EOB and she is educated on proper donning and usage. Min G to sit<>stand with elevated bed surface; cues to push off from bed. Assisted to toilet and with doffing pants and undergarments. Min G to sit<> stand on elevated toilet seat. Requires assist to pull on pants and with self-hygiene. Min G with gait for 180f in hallway; gait is steady and turns are safe. Safe distance from RW. S/o mild SOB after ambulating distance, and pt has no c/o dizziness, just that she feels tired. TLSO is doffed before returning pt to bed. She is educated on avoiding positions to exacerbate back pain. Positioned supine with pillow under knees; pt claims no pain.  Follow Up Recommendations  SNF;Supervision/Assistance - 24 hour     Equipment Recommendations       Recommendations for Other Services       Precautions / Restrictions Precautions Precautions: Fall;Back Precaution Comments: MD stated TLSO does not have to be up to mobilize/for comfort only Required Braces or Orthoses: Spinal Brace Spinal Brace: Thoracolumbosacral orthotic Restrictions Weight Bearing Restrictions: No    Mobility  Bed Mobility Overal bed mobility: Needs Assistance       Supine to sit: Mod assist     General bed mobility comments: Mod A to get EOB for assist with LE's and  getting trunk upright due to pain  Transfers Overall transfer level: Needs assistance Equipment used: Rolling walker (2 wheeled) Transfers: Sit to/from Stand Sit to Stand: Min guard Stand pivot transfers: Min guard       General transfer comment: min guard with RW for safety  Ambulation/Gait Ambulation/Gait assistance: Min guard Gait Distance (Feet): 135 Feet Assistive device: Rolling walker (2 wheeled) Gait Pattern/deviations: Step-through pattern;Decreased step length - right;Decreased step length - left;Decreased stride length;Trunk flexed;Drifts right/left Gait velocity: decreased   General Gait Details: steady with gait; safe, careful turns. Slight SOB after ambulated distance but no s/s of lightheadedness   Stairs             Wheelchair Mobility    Modified Rankin (Stroke Patients Only)       Balance                                            Cognition Arousal/Alertness: Awake/alert Behavior During Therapy: WFL for tasks assessed/performed;Anxious Overall Cognitive Status: Within Functional Limits for tasks assessed                                 General Comments: appropriate and pleasant with therapist, very anxious about possibility of needing to go to SNF but  receptive to education on reasoning why      Exercises      General Comments        Pertinent Vitals/Pain Pain Assessment: Faces Faces Pain Scale: Hurts a little bit Pain Location: back Pain Descriptors / Indicators: Aching    Home Living                      Prior Function            PT Goals (current goals can now be found in the care plan section) Acute Rehab PT Goals Patient Stated Goal: to go home and to my doctor. PT Goal Formulation: With patient Time For Goal Achievement: 04/12/20 Potential to Achieve Goals: Fair Progress towards PT goals: Progressing toward goals    Frequency    Min 3X/week      PT Plan       Co-evaluation              AM-PAC PT "6 Clicks" Mobility   Outcome Measure  Help needed turning from your back to your side while in a flat bed without using bedrails?: A Little Help needed moving from lying on your back to sitting on the side of a flat bed without using bedrails?: A Little Help needed moving to and from a bed to a chair (including a wheelchair)?: A Little Help needed standing up from a chair using your arms (e.g., wheelchair or bedside chair)?: A Little Help needed to walk in hospital room?: A Little Help needed climbing 3-5 steps with a railing? : A Lot 6 Click Score: 17    End of Session Equipment Utilized During Treatment: Back brace Activity Tolerance: Patient tolerated treatment well Patient left: with call bell/phone within reach;in bed;with bed alarm set Nurse Communication: Mobility status PT Visit Diagnosis: Unsteadiness on feet (R26.81);Difficulty in walking, not elsewhere classified (R26.2);Muscle weakness (generalized) (M62.81)     Time: 9244-6286 PT Time Calculation (min) (ACUTE ONLY): 33 min  Charges:  $Gait Training: 8-22 mins $Therapeutic Activity: 8-22 mins                     C. Parks Neptune, [facility] [phone]

## 2020-03-30 NOTE — Progress Notes (Signed)
PROGRESS NOTE  Danielle Thomas XKG:[id number] DOB: 03/21/1928   PCP: Cari Caraway, MD  Patient is from: Home  DOA: 03/27/2020 LOS: 3  Chief complaints: Mild pain and diarrhea  Brief Narrative / Interim history: 86 year old female with history of A. fib on warfarin and HTN presenting with acute back pain for 2 to 3 days, constipation for 1 week followed by diarrhea and poor p.o. intake.  She was admitted for significant electrolyte derangement with hypokalemia, hypocalcemia, hypomagnesemia and acute T11 compression fracture with minimal vertebral body height loss less than 20% and minimal bone retropulsion along the posterior aspect of the T11 vertebral body.  CT abdomen and pelvis also revealed incidental high-grade stenosis (70%) involving the origin of SMA and 60 to 70% stenosis at the origin of the celiac trunk.   Therapy recommended SNF.   Subjective: Seen and examined earlier this morning.  No major events overnight of this morning.  Continues to endorse significant pain in the back.  She rates her pain 10/10 although she does not appear to be in that much distress.  Denies numbness, tingling or weakness in her legs.  Denies bowel or bladder issue.  Objective: Vitals:   03/29/20 0857 03/29/20 1247 03/29/20 2059 03/30/20 0538  BP: (!) 167/92 137/74 (!) 161/94 (!) 168/103  Pulse:  69 82 71  Resp:  20 18 18   Temp:  97.9 F (36.6 C) 97.8 F (36.6 C) 97.9 F (36.6 C)  TempSrc:  Oral    SpO2:  98% 99% 97%  Weight:      Height:        Intake/Output Summary (Last 24 hours) at 03/30/2020 1028 Last data filed at 03/30/2020 0900 Gross per 24 hour  Intake 840 ml  Output 1300 ml  Net -460 ml   Filed Weights   03/27/20 1840  Weight: 72.7 kg    Examination:  GENERAL: No apparent distress.  Nontoxic. HEENT: MMM.  Vision and hearing grossly intact.  NECK: Supple.  No apparent JVD.  RESP: On room air.  No IWOB.  Fair aeration bilaterally. CVS:  RRR. Heart sounds normal.    ABD/GI/GU: BS+. Abd soft, NTND.  MSK/EXT:  Moves extremities. No apparent deformity. No edema.  SKIN: no apparent skin lesion or wound NEURO: Awake, alert and oriented appropriately.  No apparent focal neuro deficit. PSYCH: Calm. Normal affect.   Procedures:  None  Microbiology summarized: [id number], influenza and RSV PCR nonreactive.  Assessment & Plan: Acute T11 compression fracture: acute T11 compression fracture with minimal vertebral body height loss less than 20% and minimal bone retropulsion along the posterior aspect of the T11 vertebral body.  Per admitting provider, EDP discussed with Ortho who recommended TLSO brace if she tolerates. -Outpatient follow-up with orthopedic surgery. -Pain control with scheduled Tylenol, as needed Robaxin, tramadol and oxycodone -Calcitonin nasal spray -TLSO brace as tolerated  SMA and celiac trunk stenosis 70% stenosis involving the origin of SMA and celiac trunk.  No signs of bowel ischemia. -Outpatient follow-up with vascular surgery  Paroxysmal A. Fib: EKG appears to show a flutter with good rate control.  On warfarin for anticoagulation.  INR therapeutic. -Continue warfarin -Optimize electrolytes  Hypokalemia/hypocalcemia/hypomagnesemia: Resolved.  Likely due to diarrhea and concurrent use of HCTZ +/-losartan.  Diarrhea resolved. -Monitor intermittently  Prolonged QTC: Resolved. -Optimize electrolytes  Essential hypertension: BP slightly elevated.  Could be due to pain. -Optimize pain control -Continue amlodipine 10 mg daily -Increase losartan 100 mg daily. -Continue home HCTZ  Debility/physical deconditioning: -PT/OT  recommended SNF.  Waiting on insurance authorization.  Diarrhea/constipation: Resolved.  Body mass index is 28.39 kg/m.         DVT prophylaxis:   warfarin (COUMADIN) tablet 1 mg  warfarin (COUMADIN) tablet 1.5 mg  Code Status: Full code Family Communication: Updated patient's daughter over the phone on  11/2. Status is: Inpatient  Remains inpatient appropriate because:Unsafe d/c plan   Dispo: The patient is from: Home              Anticipated d/c is to: SNF              Anticipated d/c date is: 1 day              Patient currently is medically stable to d/c.       Consultants:  Orthopedic surgery by EDP on admission   Sch Meds:  Scheduled Meds: . acetaminophen  1,000 mg Oral Q8H  . amLODipine  10 mg Oral Daily  . calcitonin (salmon)  1 spray Alternating Nares Daily  . docusate sodium  100 mg Oral Daily  . dorzolamide  1 drop Both Eyes BID  . hydrochlorothiazide  12.5 mg Oral Daily  . latanoprost  1 drop Both Eyes QHS  . losartan  100 mg Oral Daily  . warfarin  1 mg Oral Q48H  . warfarin  1.5 mg Oral Q48H  . Warfarin - Physician Dosing Inpatient   Does not apply q1600   Continuous Infusions: . methocarbamol (ROBAXIN) IV     PRN Meds:.methocarbamol (ROBAXIN) IV, ondansetron **OR** ondansetron (ZOFRAN) IV, oxyCODONE, polyethylene glycol, rOPINIRole, senna, traMADol  Antimicrobials: Anti-infectives (From admission, onward)   None       I have personally reviewed the following labs and images: CBC: Recent Labs  Lab 03/27/20 0927 03/28/20 0603 03/29/20 0532 03/30/20 0530  WBC 10.4 8.0 9.0 7.7  NEUTROABS 8.6*  --   --   --   HGB 15.0 14.1 14.2 14.9  HCT 45.0 43.1 43.4 45.7  MCV 89.6 90.5 91.6 91.0  PLT 225 217 223 234   BMP &GFR Recent Labs  Lab 03/27/20 0927 03/27/20 1050 03/27/20 2047 03/28/20 0603 03/29/20 0532 03/30/20 0530  NA 139  --  136 140 136 137  K 2.0*  --  3.6 3.4* 4.5 4.3  CL 106  --  102 104 103 102  CO2 25  --  25 26 25 28   GLUCOSE 113*  --  141* 106* 131* 112*  BUN 12  --  14 18 27* 20  CREATININE 0.45  --  0.75 0.74 0.86 0.75  CALCIUM 6.2*  --  7.9* 8.1* 8.2* 8.5*  MG  --  1.5*  --  2.3 2.2 2.0  PHOS  --   --  1.9*  --  2.2* 2.8   Estimated Creatinine Clearance: 44.6 mL/min (by C-G formula based on SCr of 0.75 mg/dL). Liver  & Pancreas: Recent Labs  Lab 03/27/20 0927 03/27/20 2047 03/28/20 0603 03/29/20 0532 03/30/20 0530  AST 15  --  22  --   --   ALT 11  --  16  --   --   ALKPHOS 37*  --  40  --   --   BILITOT 0.7  --  0.9  --   --   PROT 4.9*  --  5.8*  --   --   ALBUMIN 2.8* 3.2* 3.0* 3.2* 3.4*   Recent Labs  Lab 03/27/20 0927  LIPASE 22  No results for input(s): AMMONIA in the last 168 hours. Diabetic: No results for input(s): HGBA1C in the last 72 hours. No results for input(s): GLUCAP in the last 168 hours. Cardiac Enzymes: No results for input(s): CKTOTAL, CKMB, CKMBINDEX, TROPONINI in the last 168 hours. No results for input(s): PROBNP in the last 8760 hours. Coagulation Profile: Recent Labs  Lab 03/27/20 1030 03/28/20 0603 03/29/20 0532 03/30/20 0530  INR 3.1* 2.8* 2.7* 2.8*   Thyroid Function Tests: No results for input(s): TSH, T4TOTAL, FREET4, T3FREE, THYROIDAB in the last 72 hours. Lipid Profile: No results for input(s): CHOL, HDL, LDLCALC, TRIG, CHOLHDL, LDLDIRECT in the last 72 hours. Anemia Panel: No results for input(s): VITAMINB12, FOLATE, FERRITIN, TIBC, IRON, RETICCTPCT in the last 72 hours. Urine analysis:    Component Value Date/Time   COLORURINE YELLOW 03/27/2020 1032   APPEARANCEUR CLEAR 03/27/2020 1032   LABSPEC 1.008 03/27/2020 1032   PHURINE 7.0 03/27/2020 1032   GLUCOSEU NEGATIVE 03/27/2020 1032   HGBUR NEGATIVE 03/27/2020 1032   BILIRUBINUR NEGATIVE 03/27/2020 1032   KETONESUR 5 (A) 03/27/2020 1032   PROTEINUR 100 (A) 03/27/2020 1032   NITRITE NEGATIVE 03/27/2020 1032   LEUKOCYTESUR NEGATIVE 03/27/2020 1032   Sepsis Labs: Invalid input(s): PROCALCITONIN, [place]  Microbiology: Recent Results (from the past 240 hour(s))  Resp Panel by RT-PCR (Flu A&B, Covid) Nasopharyngeal Swab     Status: None   Collection Time: 03/27/20 12:20 PM   Specimen: Nasopharyngeal Swab; Nasopharyngeal(NP) swabs in vial transport medium  Result Value Ref Range  Status   SARS Coronavirus 2 by RT PCR NEGATIVE NEGATIVE Final    Comment: (NOTE) SARS-CoV-2 target nucleic acids are NOT DETECTED.  The SARS-CoV-2 RNA is generally detectable in upper respiratory specimens during the acute phase of infection. The lowest concentration of SARS-CoV-2 viral copies this assay can detect is 138 copies/mL. A negative result does not preclude SARS-Cov-2 infection and should not be used as the sole basis for treatment or other patient management decisions. A negative result may occur with  improper specimen collection/handling, submission of specimen other than nasopharyngeal swab, presence of viral mutation(s) within the areas targeted by this assay, and inadequate number of viral copies(<138 copies/mL). A negative result must be combined with clinical observations, patient history, and epidemiological information. The expected result is Negative.  Fact Sheet for Patients:  EntrepreneurPulse.com.au  Fact Sheet for Healthcare Providers:  IncredibleEmployment.be  This test is no t yet approved or cleared by the [place] FDA and  has been authorized for detection and/or diagnosis of SARS-CoV-2 by FDA under an Emergency Use Authorization (EUA). This EUA will remain  in effect (meaning this test can be used) for the duration of the COVID-19 declaration under Section 564(b)(1) of the Act, 21 U.S.C.section 360bbb-3(b)(1), unless the authorization is terminated  or revoked sooner.       Influenza A by PCR NEGATIVE NEGATIVE Final   Influenza B by PCR NEGATIVE NEGATIVE Final    Comment: (NOTE) The Xpert Xpress SARS-CoV-2/FLU/RSV plus assay is intended as an aid in the diagnosis of influenza from Nasopharyngeal swab specimens and should not be used as a sole basis for treatment. Nasal washings and aspirates are unacceptable for Xpert Xpress SARS-CoV-2/FLU/RSV testing.  Fact Sheet for  Patients: EntrepreneurPulse.com.au  Fact Sheet for Healthcare Providers: IncredibleEmployment.be  This test is not yet approved or cleared by the [place] FDA and has been authorized for detection and/or diagnosis of SARS-CoV-2 by FDA under an Emergency Use Authorization (EUA). This EUA will  remain in effect (meaning this test can be used) for the duration of the COVID-19 declaration under Section 564(b)(1) of the Act, 21 U.S.C. section 360bbb-3(b)(1), unless the authorization is terminated or revoked.  Performed at [facility], [place] [address], [place], [place] [address]     Radiology Studies: No results found.    Donika Butner T. Cameron  If 7PM-7AM, please contact night-coverage [email or web address] 03/30/2020, 10:28 AM

## 2020-03-30 NOTE — Plan of Care (Signed)
  Problem: Education: Goal: Knowledge of General Education information will improve Description: Including pain rating scale, medication(s)/side effects and non-pharmacologic comfort measures Outcome: Progressing   Problem: Clinical Measurements: Goal: Will remain free from infection Outcome: Progressing Goal: Diagnostic test results will improve Outcome: Progressing   Problem: Activity: Goal: Risk for activity intolerance will decrease Outcome: Progressing   Problem: Nutrition: Goal: Adequate nutrition will be maintained Outcome: Progressing   Problem: Coping: Goal: Level of anxiety will decrease Outcome: Progressing   Problem: Elimination: Goal: Will not experience complications related to bowel motility Outcome: Progressing Goal: Will not experience complications related to urinary retention Outcome: Progressing   Problem: Safety: Goal: Ability to remain free from injury will improve Outcome: Progressing   Problem: Skin Integrity: Goal: Risk for impaired skin integrity will decrease Outcome: Progressing

## 2020-03-31 DIAGNOSIS — M545 Low back pain, unspecified: Secondary | ICD-10-CM | POA: Diagnosis not present

## 2020-03-31 DIAGNOSIS — F411 Generalized anxiety disorder: Secondary | ICD-10-CM | POA: Diagnosis not present

## 2020-03-31 DIAGNOSIS — M546 Pain in thoracic spine: Secondary | ICD-10-CM | POA: Diagnosis not present

## 2020-03-31 DIAGNOSIS — R451 Restlessness and agitation: Secondary | ICD-10-CM | POA: Diagnosis not present

## 2020-04-05 DIAGNOSIS — M545 Low back pain, unspecified: Secondary | ICD-10-CM | POA: Diagnosis not present

## 2020-04-05 DIAGNOSIS — M546 Pain in thoracic spine: Secondary | ICD-10-CM | POA: Diagnosis not present

## 2020-04-05 DIAGNOSIS — M6281 Muscle weakness (generalized): Secondary | ICD-10-CM | POA: Diagnosis not present

## 2020-04-05 DIAGNOSIS — S22080D Wedge compression fracture of T11-T12 vertebra, subsequent encounter for fracture with routine healing: Secondary | ICD-10-CM | POA: Diagnosis not present

## 2020-04-06 DIAGNOSIS — R262 Difficulty in walking, not elsewhere classified: Secondary | ICD-10-CM | POA: Diagnosis not present

## 2020-04-06 DIAGNOSIS — G8929 Other chronic pain: Secondary | ICD-10-CM | POA: Diagnosis not present

## 2020-04-06 DIAGNOSIS — R5381 Other malaise: Secondary | ICD-10-CM | POA: Diagnosis not present

## 2020-04-08 DIAGNOSIS — M546 Pain in thoracic spine: Secondary | ICD-10-CM | POA: Diagnosis not present

## 2020-04-08 DIAGNOSIS — S22080D Wedge compression fracture of T11-T12 vertebra, subsequent encounter for fracture with routine healing: Secondary | ICD-10-CM | POA: Diagnosis not present

## 2020-04-08 DIAGNOSIS — I480 Paroxysmal atrial fibrillation: Secondary | ICD-10-CM | POA: Diagnosis not present

## 2020-04-08 DIAGNOSIS — I10 Essential (primary) hypertension: Secondary | ICD-10-CM | POA: Diagnosis not present

## 2020-04-09 DIAGNOSIS — R5381 Other malaise: Secondary | ICD-10-CM | POA: Diagnosis not present

## 2020-04-09 DIAGNOSIS — G8929 Other chronic pain: Secondary | ICD-10-CM | POA: Diagnosis not present

## 2020-04-09 DIAGNOSIS — R262 Difficulty in walking, not elsewhere classified: Secondary | ICD-10-CM | POA: Diagnosis not present

## 2020-04-12 DIAGNOSIS — M546 Pain in thoracic spine: Secondary | ICD-10-CM | POA: Diagnosis not present

## 2020-04-12 DIAGNOSIS — M545 Low back pain, unspecified: Secondary | ICD-10-CM | POA: Diagnosis not present

## 2020-04-13 DIAGNOSIS — R451 Restlessness and agitation: Secondary | ICD-10-CM | POA: Diagnosis not present

## 2020-04-13 DIAGNOSIS — M545 Low back pain, unspecified: Secondary | ICD-10-CM | POA: Diagnosis not present

## 2020-04-13 DIAGNOSIS — S22080D Wedge compression fracture of T11-T12 vertebra, subsequent encounter for fracture with routine healing: Secondary | ICD-10-CM | POA: Diagnosis not present

## 2020-04-13 DIAGNOSIS — F05 Delirium due to known physiological condition: Secondary | ICD-10-CM | POA: Diagnosis not present

## 2020-04-13 DIAGNOSIS — M546 Pain in thoracic spine: Secondary | ICD-10-CM | POA: Diagnosis not present

## 2020-04-13 DIAGNOSIS — R5381 Other malaise: Secondary | ICD-10-CM | POA: Diagnosis not present

## 2020-04-13 DIAGNOSIS — F411 Generalized anxiety disorder: Secondary | ICD-10-CM | POA: Diagnosis not present

## 2020-04-13 DIAGNOSIS — R262 Difficulty in walking, not elsewhere classified: Secondary | ICD-10-CM | POA: Diagnosis not present

## 2020-04-13 DIAGNOSIS — M6281 Muscle weakness (generalized): Secondary | ICD-10-CM | POA: Diagnosis not present

## 2020-04-13 DIAGNOSIS — G8929 Other chronic pain: Secondary | ICD-10-CM | POA: Diagnosis not present

## 2020-04-15 ENCOUNTER — Other Ambulatory Visit: Payer: Self-pay | Admitting: Orthopedic Surgery

## 2020-04-15 DIAGNOSIS — M545 Low back pain, unspecified: Secondary | ICD-10-CM

## 2020-04-15 DIAGNOSIS — M546 Pain in thoracic spine: Secondary | ICD-10-CM

## 2020-04-15 DIAGNOSIS — M6281 Muscle weakness (generalized): Secondary | ICD-10-CM | POA: Diagnosis not present

## 2020-04-15 DIAGNOSIS — S22080D Wedge compression fracture of T11-T12 vertebra, subsequent encounter for fracture with routine healing: Secondary | ICD-10-CM | POA: Diagnosis not present

## 2020-04-15 DIAGNOSIS — I480 Paroxysmal atrial fibrillation: Secondary | ICD-10-CM | POA: Diagnosis not present

## 2020-04-15 DIAGNOSIS — I10 Essential (primary) hypertension: Secondary | ICD-10-CM | POA: Diagnosis not present

## 2020-04-17 DIAGNOSIS — M4854XD Collapsed vertebra, not elsewhere classified, thoracic region, subsequent encounter for fracture with routine healing: Secondary | ICD-10-CM | POA: Diagnosis not present

## 2020-04-17 DIAGNOSIS — I10 Essential (primary) hypertension: Secondary | ICD-10-CM | POA: Diagnosis not present

## 2020-04-17 DIAGNOSIS — I480 Paroxysmal atrial fibrillation: Secondary | ICD-10-CM | POA: Diagnosis not present

## 2020-04-17 DIAGNOSIS — G8929 Other chronic pain: Secondary | ICD-10-CM | POA: Diagnosis not present

## 2020-04-17 DIAGNOSIS — H409 Unspecified glaucoma: Secondary | ICD-10-CM | POA: Diagnosis not present

## 2020-04-17 DIAGNOSIS — Z7901 Long term (current) use of anticoagulants: Secondary | ICD-10-CM | POA: Diagnosis not present

## 2020-04-19 DIAGNOSIS — I480 Paroxysmal atrial fibrillation: Secondary | ICD-10-CM | POA: Diagnosis not present

## 2020-04-19 DIAGNOSIS — H409 Unspecified glaucoma: Secondary | ICD-10-CM | POA: Diagnosis not present

## 2020-04-19 DIAGNOSIS — Z7901 Long term (current) use of anticoagulants: Secondary | ICD-10-CM | POA: Diagnosis not present

## 2020-04-19 DIAGNOSIS — I10 Essential (primary) hypertension: Secondary | ICD-10-CM | POA: Diagnosis not present

## 2020-04-19 DIAGNOSIS — G8929 Other chronic pain: Secondary | ICD-10-CM | POA: Diagnosis not present

## 2020-04-19 DIAGNOSIS — M4854XD Collapsed vertebra, not elsewhere classified, thoracic region, subsequent encounter for fracture with routine healing: Secondary | ICD-10-CM | POA: Diagnosis not present

## 2020-04-20 DIAGNOSIS — I480 Paroxysmal atrial fibrillation: Secondary | ICD-10-CM | POA: Diagnosis not present

## 2020-04-20 DIAGNOSIS — G8929 Other chronic pain: Secondary | ICD-10-CM | POA: Diagnosis not present

## 2020-04-20 DIAGNOSIS — Z7901 Long term (current) use of anticoagulants: Secondary | ICD-10-CM | POA: Diagnosis not present

## 2020-04-20 DIAGNOSIS — I10 Essential (primary) hypertension: Secondary | ICD-10-CM | POA: Diagnosis not present

## 2020-04-20 DIAGNOSIS — M4854XD Collapsed vertebra, not elsewhere classified, thoracic region, subsequent encounter for fracture with routine healing: Secondary | ICD-10-CM | POA: Diagnosis not present

## 2020-04-20 DIAGNOSIS — H409 Unspecified glaucoma: Secondary | ICD-10-CM | POA: Diagnosis not present

## 2020-05-01 ENCOUNTER — Other Ambulatory Visit: Payer: Self-pay

## 2020-05-01 ENCOUNTER — Encounter (HOSPITAL_COMMUNITY): Payer: Self-pay | Admitting: Internal Medicine

## 2020-05-01 ENCOUNTER — Emergency Department (HOSPITAL_COMMUNITY): Payer: Medicare HMO

## 2020-05-01 ENCOUNTER — Inpatient Hospital Stay (HOSPITAL_COMMUNITY)
Admission: EM | Admit: 2020-05-01 | Discharge: 2020-05-13 | DRG: 460 | Disposition: A | Discharge status: Rehabilitation Facility | Payer: Medicare HMO | Attending: Internal Medicine | Admitting: Internal Medicine

## 2020-05-01 DIAGNOSIS — K5900 Constipation, unspecified: Secondary | ICD-10-CM | POA: Diagnosis not present

## 2020-05-01 DIAGNOSIS — R52 Pain, unspecified: Secondary | ICD-10-CM | POA: Diagnosis not present

## 2020-05-01 DIAGNOSIS — S22000A Wedge compression fracture of unspecified thoracic vertebra, initial encounter for closed fracture: Secondary | ICD-10-CM | POA: Diagnosis not present

## 2020-05-01 DIAGNOSIS — R0781 Pleurodynia: Secondary | ICD-10-CM | POA: Diagnosis not present

## 2020-05-01 DIAGNOSIS — R791 Abnormal coagulation profile: Secondary | ICD-10-CM | POA: Diagnosis present

## 2020-05-01 DIAGNOSIS — E876 Hypokalemia: Secondary | ICD-10-CM | POA: Diagnosis not present

## 2020-05-01 DIAGNOSIS — E871 Hypo-osmolality and hyponatremia: Secondary | ICD-10-CM | POA: Diagnosis present

## 2020-05-01 DIAGNOSIS — R627 Adult failure to thrive: Secondary | ICD-10-CM | POA: Diagnosis present

## 2020-05-01 DIAGNOSIS — R54 Age-related physical debility: Secondary | ICD-10-CM | POA: Diagnosis present

## 2020-05-01 DIAGNOSIS — Z885 Allergy status to narcotic agent status: Secondary | ICD-10-CM

## 2020-05-01 DIAGNOSIS — Z20822 Contact with and (suspected) exposure to covid-19: Secondary | ICD-10-CM | POA: Diagnosis present

## 2020-05-01 DIAGNOSIS — M545 Low back pain, unspecified: Secondary | ICD-10-CM | POA: Diagnosis not present

## 2020-05-01 DIAGNOSIS — M4850XA Collapsed vertebra, not elsewhere classified, site unspecified, initial encounter for fracture: Secondary | ICD-10-CM

## 2020-05-01 DIAGNOSIS — G2581 Restless legs syndrome: Secondary | ICD-10-CM | POA: Diagnosis present

## 2020-05-01 DIAGNOSIS — I119 Hypertensive heart disease without heart failure: Secondary | ICD-10-CM | POA: Diagnosis present

## 2020-05-01 DIAGNOSIS — I482 Chronic atrial fibrillation, unspecified: Secondary | ICD-10-CM | POA: Diagnosis present

## 2020-05-01 DIAGNOSIS — S2242XD Multiple fractures of ribs, left side, subsequent encounter for fracture with routine healing: Secondary | ICD-10-CM | POA: Diagnosis not present

## 2020-05-01 DIAGNOSIS — E785 Hyperlipidemia, unspecified: Secondary | ICD-10-CM | POA: Diagnosis not present

## 2020-05-01 DIAGNOSIS — Z7901 Long term (current) use of anticoagulants: Secondary | ICD-10-CM

## 2020-05-01 DIAGNOSIS — E669 Obesity, unspecified: Secondary | ICD-10-CM | POA: Diagnosis present

## 2020-05-01 DIAGNOSIS — S2242XA Multiple fractures of ribs, left side, initial encounter for closed fracture: Secondary | ICD-10-CM

## 2020-05-01 DIAGNOSIS — Z79899 Other long term (current) drug therapy: Secondary | ICD-10-CM

## 2020-05-01 DIAGNOSIS — M4804 Spinal stenosis, thoracic region: Secondary | ICD-10-CM | POA: Diagnosis present

## 2020-05-01 DIAGNOSIS — Z6828 Body mass index (BMI) 28.0-28.9, adult: Secondary | ICD-10-CM

## 2020-05-01 DIAGNOSIS — K5901 Slow transit constipation: Secondary | ICD-10-CM | POA: Diagnosis not present

## 2020-05-01 DIAGNOSIS — I771 Stricture of artery: Secondary | ICD-10-CM | POA: Diagnosis present

## 2020-05-01 DIAGNOSIS — R3129 Other microscopic hematuria: Secondary | ICD-10-CM | POA: Diagnosis not present

## 2020-05-01 DIAGNOSIS — B9620 Unspecified Escherichia coli [E. coli] as the cause of diseases classified elsewhere: Secondary | ICD-10-CM | POA: Diagnosis present

## 2020-05-01 DIAGNOSIS — W1830XA Fall on same level, unspecified, initial encounter: Secondary | ICD-10-CM | POA: Diagnosis present

## 2020-05-01 DIAGNOSIS — G8929 Other chronic pain: Secondary | ICD-10-CM | POA: Diagnosis not present

## 2020-05-01 DIAGNOSIS — S22080G Wedge compression fracture of T11-T12 vertebra, subsequent encounter for fracture with delayed healing: Secondary | ICD-10-CM | POA: Diagnosis not present

## 2020-05-01 DIAGNOSIS — Z888 Allergy status to other drugs, medicaments and biological substances status: Secondary | ICD-10-CM | POA: Diagnosis not present

## 2020-05-01 DIAGNOSIS — Z9119 Patient's noncompliance with other medical treatment and regimen: Secondary | ICD-10-CM

## 2020-05-01 DIAGNOSIS — I4891 Unspecified atrial fibrillation: Secondary | ICD-10-CM | POA: Diagnosis present

## 2020-05-01 DIAGNOSIS — I951 Orthostatic hypotension: Secondary | ICD-10-CM | POA: Diagnosis not present

## 2020-05-01 DIAGNOSIS — I10 Essential (primary) hypertension: Secondary | ICD-10-CM | POA: Diagnosis not present

## 2020-05-01 DIAGNOSIS — Z90711 Acquired absence of uterus with remaining cervical stump: Secondary | ICD-10-CM

## 2020-05-01 DIAGNOSIS — S22080A Wedge compression fracture of T11-T12 vertebra, initial encounter for closed fracture: Secondary | ICD-10-CM | POA: Diagnosis not present

## 2020-05-01 DIAGNOSIS — N390 Urinary tract infection, site not specified: Secondary | ICD-10-CM | POA: Diagnosis not present

## 2020-05-01 DIAGNOSIS — Z981 Arthrodesis status: Secondary | ICD-10-CM | POA: Diagnosis not present

## 2020-05-01 DIAGNOSIS — H409 Unspecified glaucoma: Secondary | ICD-10-CM | POA: Diagnosis present

## 2020-05-01 DIAGNOSIS — D62 Acute posthemorrhagic anemia: Secondary | ICD-10-CM | POA: Diagnosis not present

## 2020-05-01 DIAGNOSIS — E860 Dehydration: Secondary | ICD-10-CM | POA: Diagnosis present

## 2020-05-01 DIAGNOSIS — Z8673 Personal history of transient ischemic attack (TIA), and cerebral infarction without residual deficits: Secondary | ICD-10-CM

## 2020-05-01 DIAGNOSIS — Z4789 Encounter for other orthopedic aftercare: Secondary | ICD-10-CM | POA: Diagnosis not present

## 2020-05-01 DIAGNOSIS — Z7401 Bed confinement status: Secondary | ICD-10-CM

## 2020-05-01 DIAGNOSIS — S22080D Wedge compression fracture of T11-T12 vertebra, subsequent encounter for fracture with routine healing: Secondary | ICD-10-CM | POA: Diagnosis not present

## 2020-05-01 DIAGNOSIS — M549 Dorsalgia, unspecified: Secondary | ICD-10-CM | POA: Diagnosis not present

## 2020-05-01 DIAGNOSIS — R208 Other disturbances of skin sensation: Secondary | ICD-10-CM | POA: Diagnosis not present

## 2020-05-01 DIAGNOSIS — I480 Paroxysmal atrial fibrillation: Secondary | ICD-10-CM | POA: Diagnosis not present

## 2020-05-01 DIAGNOSIS — M4854XD Collapsed vertebra, not elsewhere classified, thoracic region, subsequent encounter for fracture with routine healing: Secondary | ICD-10-CM | POA: Diagnosis not present

## 2020-05-01 DIAGNOSIS — D72829 Elevated white blood cell count, unspecified: Secondary | ICD-10-CM | POA: Diagnosis not present

## 2020-05-01 DIAGNOSIS — Z419 Encounter for procedure for purposes other than remedying health state, unspecified: Secondary | ICD-10-CM

## 2020-05-01 LAB — BASIC METABOLIC PANEL
Anion gap: 13 (ref 5–15)
BUN: 22 mg/dL (ref 8–23)
CO2: 30 mmol/L (ref 22–32)
Calcium: 9 mg/dL (ref 8.9–10.3)
Chloride: 91 mmol/L — ABNORMAL LOW (ref 98–111)
Creatinine, Ser: 0.9 mg/dL (ref 0.44–1.00)
GFR, Estimated: >60 mL/min (ref >60)
Glucose, Bld: 130 mg/dL — ABNORMAL HIGH (ref 70–99)
Potassium: 2.9 mmol/L — ABNORMAL LOW (ref 3.5–5.1)
Sodium: 134 mmol/L — ABNORMAL LOW (ref 135–145)

## 2020-05-01 LAB — CBC WITH DIFFERENTIAL/PLATELET
Abs Immature Granulocytes: 0.13 10*3/uL — ABNORMAL HIGH (ref 0.00–0.07)
Basophils Absolute: 0 10*3/uL (ref 0.0–0.1)
Basophils Relative: 0 %
Eosinophils Absolute: 0.1 10*3/uL (ref 0.0–0.5)
Eosinophils Relative: 0 %
HCT: 49.8 % — ABNORMAL HIGH (ref 36.0–46.0)
Hemoglobin: 16.9 g/dL — ABNORMAL HIGH (ref 12.0–15.0)
Immature Granulocytes: 1 %
Lymphocytes Relative: 9 %
Lymphs Abs: 1 10*3/uL (ref 0.7–4.0)
MCH: 30.1 pg (ref 26.0–34.0)
MCHC: 33.9 g/dL (ref 30.0–36.0)
MCV: 88.6 fL (ref 80.0–100.0)
Monocytes Absolute: 0.9 10*3/uL (ref 0.1–1.0)
Monocytes Relative: 8 %
Neutro Abs: 9.2 10*3/uL — ABNORMAL HIGH (ref 1.7–7.7)
Neutrophils Relative %: 82 %
Platelets: 232 10*3/uL (ref 150–400)
RBC: 5.62 MIL/uL — ABNORMAL HIGH (ref 3.87–5.11)
RDW: 15.2 % (ref 11.5–15.5)
WBC: 11.3 10*3/uL — ABNORMAL HIGH (ref 4.0–10.5)
nRBC: 0 % (ref 0.0–0.2)

## 2020-05-01 LAB — RESP PANEL BY RT-PCR (FLU A&B, COVID) ARPGX2
Influenza A by PCR: NEGATIVE
Influenza B by PCR: NEGATIVE
SARS Coronavirus 2 by RT PCR: NEGATIVE

## 2020-05-01 LAB — PROTIME-INR
INR: 2.8 — ABNORMAL HIGH (ref 0.8–1.2)
Prothrombin Time: 28.7 seconds — ABNORMAL HIGH (ref 11.4–15.2)

## 2020-05-01 LAB — MAGNESIUM: Magnesium: 1.9 mg/dL (ref 1.7–2.4)

## 2020-05-01 MED ORDER — MORPHINE SULFATE (PF) 4 MG/ML IV SOLN
4.0000 mg | Freq: Once | INTRAVENOUS | Status: AC
Start: 2020-05-01 — End: 2020-05-01
  Administered 2020-05-01: 16:00:00 4 mg via INTRAVENOUS
  Filled 2020-05-01: qty 1

## 2020-05-01 MED ORDER — BISACODYL 5 MG PO TBEC
5.0000 mg | DELAYED_RELEASE_TABLET | Freq: Every day | ORAL | Status: DC | PRN
  Administered 2020-05-02: 08:00:00 5 mg via ORAL
  Filled 2020-05-01: qty 1

## 2020-05-01 MED ORDER — ACETAMINOPHEN 325 MG PO TABS: 650.0000 mg | ORAL_TABLET | Freq: Four times a day (QID) | ORAL | Status: DC | PRN

## 2020-05-01 MED ORDER — MORPHINE SULFATE (PF) 4 MG/ML IV SOLN
6.0000 mg | Freq: Once | INTRAVENOUS | Status: AC
  Administered 2020-05-01: 14:00:00 6 mg via INTRAMUSCULAR
  Filled 2020-05-01: qty 2

## 2020-05-01 MED ORDER — HYDROMORPHONE HCL 1 MG/ML IJ SOLN
0.5000 mg | Freq: Once | INTRAMUSCULAR | Status: AC
  Administered 2020-05-01: 19:00:00 0.5 mg via INTRAVENOUS
  Filled 2020-05-01: qty 1

## 2020-05-01 MED ORDER — POTASSIUM CHLORIDE 20 MEQ PO PACK
60.0000 meq | PACK | Freq: Once | ORAL | Status: AC
  Administered 2020-05-01: 23:00:00 60 meq via ORAL
  Filled 2020-05-01: qty 3

## 2020-05-01 MED ORDER — ONDANSETRON HCL 4 MG PO TABS: 4.0000 mg | ORAL_TABLET | Freq: Four times a day (QID) | ORAL | Status: DC | PRN

## 2020-05-01 MED ORDER — LOSARTAN POTASSIUM 50 MG PO TABS
50.0000 mg | ORAL_TABLET | Freq: Every day | ORAL | Status: DC
  Administered 2020-05-02 – 2020-05-13 (×11): 50 mg via ORAL
  Filled 2020-05-01 (×11): qty 1

## 2020-05-01 MED ORDER — HYDROCODONE-ACETAMINOPHEN 5-325 MG PO TABS
1.0000 | ORAL_TABLET | ORAL | Status: DC | PRN
  Administered 2020-05-01 – 2020-05-07 (×12): 2 via ORAL
  Administered 2020-05-07: 1 via ORAL
  Administered 2020-05-08: 2 via ORAL
  Administered 2020-05-08 – 2020-05-09 (×3): 1 via ORAL
  Filled 2020-05-01 (×2): qty 2
  Filled 2020-05-01: qty 1
  Filled 2020-05-01: qty 2
  Filled 2020-05-01: qty 1
  Filled 2020-05-01 (×4): qty 2
  Filled 2020-05-01: qty 1
  Filled 2020-05-01 (×5): qty 2
  Filled 2020-05-01: qty 1
  Filled 2020-05-01 (×2): qty 2

## 2020-05-01 MED ORDER — ACETAMINOPHEN 650 MG RE SUPP: 650.0000 mg | Freq: Four times a day (QID) | RECTAL | Status: DC | PRN

## 2020-05-01 MED ORDER — ONDANSETRON HCL 4 MG/2ML IJ SOLN: 4.0000 mg | Freq: Four times a day (QID) | INTRAMUSCULAR | Status: DC | PRN

## 2020-05-01 MED ORDER — HYDROMORPHONE HCL 1 MG/ML IJ SOLN
0.5000 mg | INTRAMUSCULAR | Status: DC | PRN
  Administered 2020-05-02: 1 mg via INTRAVENOUS
  Filled 2020-05-01: qty 1

## 2020-05-01 MED ORDER — ROPINIROLE HCL 0.5 MG PO TABS
0.2500 mg | ORAL_TABLET | Freq: Two times a day (BID) | ORAL | Status: DC | PRN
  Administered 2020-05-02 – 2020-05-12 (×5): 0.25 mg via ORAL
  Filled 2020-05-01 (×5): qty 1

## 2020-05-01 MED ORDER — SENNOSIDES-DOCUSATE SODIUM 8.6-50 MG PO TABS: 1.0000 | ORAL_TABLET | Freq: Every evening | ORAL | Status: DC | PRN

## 2020-05-01 MED ORDER — ENSURE ENLIVE PO LIQD
237.0000 mL | Freq: Two times a day (BID) | ORAL | Status: DC
  Administered 2020-05-02 (×2): 237 mL via ORAL

## 2020-05-01 MED ORDER — POTASSIUM CHLORIDE CRYS ER 20 MEQ PO TBCR
40.0000 meq | EXTENDED_RELEASE_TABLET | Freq: Once | ORAL | Status: AC
  Administered 2020-05-01: 18:00:00 40 meq via ORAL
  Filled 2020-05-01: qty 2

## 2020-05-01 NOTE — ED Provider Notes (Signed)
[facility] DEPT Provider Note   CSN: [id number] Arrival date & time: 05/01/20  1225     History Chief Complaint  Patient presents with  . Back Pain    Danielle Thomas is a 86 y.o. female.  Patient is 86 year old female who presents with back pain.  She has a history of a recent admission on November 20 at which point she was diagnosed with a T11 compression fracture.  She was given a TLSO brace and has been on pain medication but she says its not controlling her pain.  She lives at home but has family and friends that come over frequently and help her.  She was initially discharged to a rehab facility but she describes it as a "hell-hole" and currently is at home.  She said that she is continuing to have the same pain that she had when she was diagnosed with this compression fracture.  She says its not any different but the pain medicine is not controlling her pain and she is not able to get any sleep.  She is taking Percocet 5 mg tablets as well as tramadol 50 mg tablets.  She has constant throbbing pain in her right mid back.  There is no radiation down her legs.  No numbness or weakness in her legs.  She says that she is able to ambulate but it hurts when she gets up so she stays in bed a lot.  She says she does not wear her TLSO much because it hurts when she is sitting up out of bed.  She has an appointment to have an MRI in 4 days and following that has an appointment with an orthopedist.  She denies any abdominal pain.  No fevers.  No urinary symptoms.  No vomiting or diarrhea.  She denies any recent falls.        Past Medical History:  Diagnosis Date  . Hypertension     Patient Active Problem List   Diagnosis Date Noted  . Hypokalemia 03/27/2020  . Chest pressure 06/23/2015  . Chronic anticoagulation 04/09/2014  . Essential hypertension, benign 04/08/2013  . Hyperlipidemia 04/08/2013  . Atrial fibrillation ([place]) 02/13/2013    Past Surgical  History:  Procedure Laterality Date  . KNEE SURGERY    . LUNG BIOPSY    . PARTIAL HYSTERECTOMY    . TONSILLECTOMY AND ADENOIDECTOMY       OB History   No obstetric history on file.     Family History  Problem Relation Age of Onset  . Other Other        NO HEALTH ISSUES    Social History   Tobacco Use  . Smoking status: Never Smoker  . Smokeless tobacco: Never Used    Home Medications Prior to Admission medications   Medication Sig Start Date End Date Taking? Authorizing Provider  amLODipine (NORVASC) 10 MG tablet Take 1 tablet (10 mg total) by mouth daily. 03/31/20   Mercy Riding, MD  dorzolamide (TRUSOPT) 2 % ophthalmic solution Place 1 drop into both eyes 2 (two) times daily.     [provider]  hydrochlorothiazide (MICROZIDE) 12.5 MG capsule Take 1 capsule (12.5 mg total) by mouth daily. *Please call and schedule a one year follow up appointment* 06/02/16   Belva Crome, MD  losartan (COZAAR) 50 MG tablet Take 50 mg by mouth daily. 03/15/20   [provider]  polyethylene glycol (MIRALAX / GLYCOLAX) 17 g packet Take 17 g by mouth  2 (two) times daily as needed for mild constipation (use first). 03/30/20   Mercy Riding, MD  rOPINIRole (REQUIP) 0.25 MG tablet Take 0.25 mg by mouth 3 (three) times daily as needed. Restless leg 03/05/20   [provider]  senna-docusate (SENOKOT-S) 8.6-50 MG tablet Take 1 tablet by mouth 2 (two) times daily between meals as needed for mild constipation. 03/30/20   Mercy Riding, MD  travoprost, benzalkonium, (TRAVATAN) 0.004 % ophthalmic solution Place 1 drop into both eyes at bedtime.     [provider]  warfarin (COUMADIN) 1 MG tablet Take 1 mg by mouth See admin instructions. Patient alternates with 1/2 tablet = 1.5mg  every other day. 02/19/20   [provider]  warfarin (COUMADIN) 3 MG tablet Take 1.5 mg by mouth See admin instructions. Patient alternates with 1 mg every other day     [provider]    Allergies    Codeine, Meperidine, and Oxycodone  Review of Systems   Review of Systems  Constitutional: Negative for chills, diaphoresis, fatigue and fever.  HENT: Negative for congestion, rhinorrhea and sneezing.   Eyes: Negative.   Respiratory: Negative for cough, chest tightness and shortness of breath.   Cardiovascular: Negative for chest pain and leg swelling.  Gastrointestinal: Negative for abdominal pain, blood in stool, diarrhea, nausea and vomiting.  Genitourinary: Negative for difficulty urinating, flank pain, frequency and hematuria.  Musculoskeletal: Positive for back pain. Negative for arthralgias.  Skin: Negative for rash.  Neurological: Negative for dizziness, speech difficulty, weakness, numbness and headaches.    Physical Exam Updated Vital Signs BP (!) 182/94   Pulse 85   Temp 97.8 F (36.6 C) (Oral)   Resp 18   Ht 5\' 3"  (1.6 m)   Wt 72.7 kg   SpO2 98%   BMI 28.39 kg/m   Physical Exam Constitutional:      Appearance: She is well-developed and well-nourished.  HENT:     Head: Normocephalic and atraumatic.  Eyes:     Pupils: Pupils are equal, round, and reactive to light.  Cardiovascular:     Rate and Rhythm: Normal rate and regular rhythm.     Heart sounds: Normal heart sounds.  Pulmonary:     Effort: Pulmonary effort is normal. No respiratory distress.     Breath sounds: Normal breath sounds. No wheezing or rales.  Chest:     Chest wall: No tenderness.  Abdominal:     General: Bowel sounds are normal.     Palpations: Abdomen is soft.     Tenderness: There is no abdominal tenderness. There is no guarding or rebound.  Musculoskeletal:        General: No edema. Normal range of motion.     Cervical back: Normal range of motion and neck supple.     Comments: Positive tenderness in the right mid back.  It seems to be more lateral.  She does not have any direct midline tenderness.  She is a little bit tender on the right lower  posterior ribs, no crepitus or deformity.  She has normal motor function and sensation to light touch in the lower extremities bilaterally.  Pedal pulses are intact bilaterally.  Lymphadenopathy:     Cervical: No cervical adenopathy.  Skin:    General: Skin is warm and dry.     Findings: No rash.  Neurological:     Mental Status: She is alert and oriented to person, place, and time.  Psychiatric:  Mood and Affect: Mood and affect normal.     ED Results / Procedures / Treatments   Labs (all labs ordered are listed, but only abnormal results are displayed) Labs Reviewed  BASIC METABOLIC PANEL - Abnormal; Notable for the following components:      Result Value   Sodium 134 (*)    Potassium 2.9 (*)    Chloride 91 (*)    Glucose, Bld 130 (*)    All other components within normal limits  CBC WITH DIFFERENTIAL/PLATELET - Abnormal; Notable for the following components:   WBC 11.3 (*)    RBC 5.62 (*)    Hemoglobin 16.9 (*)    HCT 49.8 (*)    Neutro Abs 9.2 (*)    Abs Immature Granulocytes 0.13 (*)    All other components within normal limits  PROTIME-INR - Abnormal; Notable for the following components:   Prothrombin Time 28.7 (*)    INR 2.8 (*)    All other components within normal limits  RESP PANEL BY RT-PCR (FLU A&B, COVID) [id number]    EKG None  Radiology DG Ribs Unilateral W/Chest Right  Result Date: 05/01/2020 CLINICAL DATA:  Rib and back pain, recent T11 fracture EXAM: RIGHT RIBS AND CHEST - 3+ VIEW COMPARISON:  11/26/2017 FINDINGS: No displaced fracture or other bone lesions are seen involving the ribs. There is no evidence of pneumothorax or pleural effusion. Cardiomegaly. No acute appearing airspace opacity. Surgical clips and suture project over the right lung base. IMPRESSION: 1. No displaced rib fracture or other acute abnormality of the right ribs. No pneumothorax or pleural effusion. 2. The thoracic spine is not well imaged on frontal and oblique views provided  per report of recent T11 fracture. 3. Cardiomegaly. 4. No acute appearing airspace opacity. Electronically Signed   By: Eddie Candle M.D.   On: 05/01/2020 14:54   MR THORACIC SPINE WO CONTRAST  Result Date: 05/01/2020 CLINICAL DATA:  86 year old female with recent T11 fracture. Rib and back pain. EXAM: MRI THORACIC AND LUMBAR SPINE WITHOUT CONTRAST TECHNIQUE: Multiplanar and multiecho pulse sequences of the thoracic and lumbar spine were obtained without intravenous contrast. COMPARISON:  Rib radiographs earlier 03/27/2020. Today. CT Abdomen and Pelvis FINDINGS: MRI THORACIC SPINE FINDINGS Limited cervical spine imaging: Previous cervical ACDF, appears to be at the C4-C5 levels. Thoracic spine segmentation: Appears to be normal aside from hypoplastic ribs at T12, as suggested on CT Abdomen and Pelvis last month. Alignment: Normal thoracic vertebral height and alignment above the T11 level, see below. Vertebrae: Thoracic vertebrae T1 through T8 are intact with normal marrow signal. T9 vertebral body appears intact but there is an acute to subacute fracture of the posterior left 9th rib with marrow edema (series 17, image 17 and series 21, image 30). T10 vertebra appears intact but there is a fracture of the posterior left 10th rib (same image on series 17 and series 21, image 33). Progress T11 compression fracture since last month with comminution, confluent marrow edema, vertebral loss of height now up to 40% (previously less than 20%). New retropulsion of bone (series 17, image 10) with mild spinal stenosis (series 21, image 37). Mild associated spinal cord mass effect (series 20, image 37 and series 29, image 2). No spinal cord edema or myelomalacia suspected. Moderate to severe left and moderate right T11 foraminal stenosis also is increased. Superimposed left posterior levin through fracture suspected. The T12 vertebra appears intact with superimposed benign vertebral body hemangioma. Cord: Capacious  thoracic spinal canal above  T11. See T11 spinal stenosis and cord mass effect details above. No definite cord signal abnormality. Conus medullaris is at T12-L1. Paraspinal and other soft tissues: Left paraspinal soft tissue edema from the 9th rib through T12 vertebral level. No drainable paraspinal fluid collection. Trace bilateral layering pleural effusions. Small round right lung base fat density nodule appears stable from last month. Disc levels: No age advanced degenerative changes superimposed on the acute osseous abnormalities detailed above. MRI LUMBAR SPINE FINDINGS Segmentation:  Normal. Alignment: Moderate levoconvex lumbar scoliosis appears stable from last month. Stable lumbar vertebral height and alignment. Vertebrae: No marrow edema or evidence of acute osseous abnormality in the lumbar spine. Intact visible sacrum and SI joints. Conus medullaris and cauda equina: Conus extends to the T12-L1 level. Conus and cauda equina appear normal. Paraspinal and other soft tissues: Left posterolateral lower thoracic paraspinal soft tissue edema but no acute lumbar paraspinal soft tissue finding. Chronic lumbar paraspinal muscle atrophy. Visible abdominal viscera appear stable since last month. Disc levels: Advanced lumbar spine degeneration in the setting of levoconvex scoliosis. Up to mild associated lumbar spinal stenosis. IMPRESSION: MR THORACIC SPINE IMPRESSION 1. Progressed T11 compression fracture since last month. Increased comminution. Loss of height increased from 20% to 40%. And new retropulsion of bone resulting in mild spinal stenosis with mild cord mass effect but no spinal cord signal abnormality. 2. Associated acute to subacute posterior left rib fractures 9 through 11. 3. Left paraspinal soft tissue edema from T9 to T12. 4. Trace layering pleural effusions. MR LUMBAR SPINE IMPRESSION 1. No acute osseous abnormality in the lumbar spine. 2. Advanced lumbar spine degeneration the setting of moderate  levoconvex lumbar scoliosis. Electronically Signed   By: Genevie Ann M.D.   On: 05/01/2020 17:50   MR LUMBAR SPINE WO CONTRAST  Result Date: 05/01/2020 CLINICAL DATA:  86 year old female with recent T11 fracture. Rib and back pain. EXAM: MRI THORACIC AND LUMBAR SPINE WITHOUT CONTRAST TECHNIQUE: Multiplanar and multiecho pulse sequences of the thoracic and lumbar spine were obtained without intravenous contrast. COMPARISON:  Rib radiographs earlier 03/27/2020. Today. CT Abdomen and Pelvis FINDINGS: MRI THORACIC SPINE FINDINGS Limited cervical spine imaging: Previous cervical ACDF, appears to be at the C4-C5 levels. Thoracic spine segmentation: Appears to be normal aside from hypoplastic ribs at T12, as suggested on CT Abdomen and Pelvis last month. Alignment: Normal thoracic vertebral height and alignment above the T11 level, see below. Vertebrae: Thoracic vertebrae T1 through T8 are intact with normal marrow signal. T9 vertebral body appears intact but there is an acute to subacute fracture of the posterior left 9th rib with marrow edema (series 17, image 17 and series 21, image 30). T10 vertebra appears intact but there is a fracture of the posterior left 10th rib (same image on series 17 and series 21, image 33). Progress T11 compression fracture since last month with comminution, confluent marrow edema, vertebral loss of height now up to 40% (previously less than 20%). New retropulsion of bone (series 17, image 10) with mild spinal stenosis (series 21, image 37). Mild associated spinal cord mass effect (series 20, image 37 and series 29, image 2). No spinal cord edema or myelomalacia suspected. Moderate to severe left and moderate right T11 foraminal stenosis also is increased. Superimposed left posterior levin through fracture suspected. The T12 vertebra appears intact with superimposed benign vertebral body hemangioma. Cord: Capacious thoracic spinal canal above T11. See T11 spinal stenosis and cord mass  effect details above. No definite cord signal abnormality. Conus  medullaris is at T12-L1. Paraspinal and other soft tissues: Left paraspinal soft tissue edema from the 9th rib through T12 vertebral level. No drainable paraspinal fluid collection. Trace bilateral layering pleural effusions. Small round right lung base fat density nodule appears stable from last month. Disc levels: No age advanced degenerative changes superimposed on the acute osseous abnormalities detailed above. MRI LUMBAR SPINE FINDINGS Segmentation:  Normal. Alignment: Moderate levoconvex lumbar scoliosis appears stable from last month. Stable lumbar vertebral height and alignment. Vertebrae: No marrow edema or evidence of acute osseous abnormality in the lumbar spine. Intact visible sacrum and SI joints. Conus medullaris and cauda equina: Conus extends to the T12-L1 level. Conus and cauda equina appear normal. Paraspinal and other soft tissues: Left posterolateral lower thoracic paraspinal soft tissue edema but no acute lumbar paraspinal soft tissue finding. Chronic lumbar paraspinal muscle atrophy. Visible abdominal viscera appear stable since last month. Disc levels: Advanced lumbar spine degeneration in the setting of levoconvex scoliosis. Up to mild associated lumbar spinal stenosis. IMPRESSION: MR THORACIC SPINE IMPRESSION 1. Progressed T11 compression fracture since last month. Increased comminution. Loss of height increased from 20% to 40%. And new retropulsion of bone resulting in mild spinal stenosis with mild cord mass effect but no spinal cord signal abnormality. 2. Associated acute to subacute posterior left rib fractures 9 through 11. 3. Left paraspinal soft tissue edema from T9 to T12. 4. Trace layering pleural effusions. MR LUMBAR SPINE IMPRESSION 1. No acute osseous abnormality in the lumbar spine. 2. Advanced lumbar spine degeneration the setting of moderate levoconvex lumbar scoliosis. Electronically Signed   By: Genevie Ann M.D.    On: 05/01/2020 17:50    Procedures Procedures (including critical care time)  Medications Ordered in ED Medications  HYDROmorphone (DILAUDID) injection 0.5 mg (has no administration in time range)  morphine 4 MG/ML injection 6 mg (6 mg Intramuscular Given 05/01/20 1341)  morphine 4 MG/ML injection 4 mg (4 mg Intravenous Given 05/01/20 1538)  potassium chloride SA (KLOR-CON) CR tablet 40 mEq (40 mEq Oral Given 05/01/20 1740)    ED Course  I have reviewed the triage vital signs and the nursing notes.  Pertinent labs & imaging results that were available during my care of the patient were reviewed by me and considered in my medical decision making (see chart for details).    MDM Rules/Calculators/A&P                          Patient presents with some worsening back pain after recent diagnosis of T11 compression fracture.  Sounds like she is not wearing a TLSO consistently although at this point she is essentially bedbound.  She gets out of bed relatively infrequently per her report.  Her pain is not being controlled with her home pain medications of oxycodone and tramadol.  She was scheduled to have an MRI on Tuesday but her daughter is concerned about her being able to get out of the house to have this done and is requesting to have it today.  Given her worsening symptoms and the fact that she is on Coumadin, MRI was performed to better assess the area and to assess for other complications such as epidural hematoma.  She had an MRI which showed worsening of the compression fracture.  There was increased loss of vertebral height with some new retropulsion of bone fragments and mild central cord mass-effect.  I spoke with Dr. Christella Noa with neurosurgery who felt that patient may  benefit from kyphoplasty.  It was felt that she could potentially be admitted and could be assessed as to whether or not she is a candidate for kyphoplasty on Monday.  He did not feel that there is any emergent neurosurgery  concern.  I spoke with Dr. Posey Pronto with the hospitalist service who will admit the patient for further treatment.  Of note her labs showed a mild hypokalemia.  She was given morphine and Dilaudid in the ED with only minimal improvement in symptoms.  She was also noted to have some left-sided rib fractures on the MRI.  She is not really tender in this area.  She does have some tenderness on the right side which is why I had initially ordered right rib x-rays.  She does not have any evidence of right rib x-rays and I do not feel that the left rib fractures are significantly contributing to her symptoms at this point.  Final Clinical Impression(s) / ED Diagnoses Final diagnoses:  Compression fracture of T11 vertebra with delayed healing, subsequent encounter  Closed fracture of multiple ribs of left side, initial encounter    Rx / DC Orders ED Discharge Orders    None       Malvin Johns, MD 05/01/20 1914

## 2020-05-01 NOTE — ED Triage Notes (Signed)
Pt came from home via EMS. C/c: chronic back pain. EMS reports that something happened 2 months ago and was dx with a T11 fx. Mid back pain ever since. Pt went to rehab facility but family pulled her out. MRI scheduled for Tuesday. Pt takes hydrocodone at home for pain.  A &O x4.

## 2020-05-01 NOTE — Plan of Care (Signed)
HOH. Plan of care discussed.

## 2020-05-01 NOTE — ED Notes (Signed)
Pt to MRI

## 2020-05-01 NOTE — ED Notes (Signed)
[place], MRI tech will be here ASAP

## 2020-05-01 NOTE — H&P (Signed)
History and Physical    Danielle Thomas:[id number] DOB: 08-18-1927 DOA: 05/01/2020  PCP: Cari Caraway, MD  Patient coming from: Home via EMS  I have personally briefly reviewed patient's old medical records in [place]  Chief Complaint: Back pain  HPI: Danielle Thomas is a 86 y.o. female with medical history significant for atrial fibrillation on Coumadin, hypertension, restless leg syndrome who presents to the ED for evaluation of uncontrolled back pain.  Patient was recently admitted from 03/27/2020-03/29/2020 for multiple electrolyte abnormalities.  She was also found to have a T11 compression fracture which was managed with pain medication and TLSO brace.  She was discharged to SNF with plan for outpatient follow-up.  Patient states she was in SNF for 2 weeks before returning home.  She says that since she has been in the hospital she has been essentially bedbound due to difficulty ambulating secondary to continued back pain.  She says previously she would ambulate with the use of a walker.  She currently lives alone.  She otherwise denies any focal deficits or new weakness in her extremities.  ED Course:  Initial vitals showed BP 180/92, pulse 83, RR 16, temp 97.8 F, SPO2 97% on room air.  Labs show sodium 134, potassium 2.9, bicarb 30, BUN 22, creatinine 0.90, serum glucose 130, WBC 11.3, hemoglobin 16.9, platelets 232,000, INR 2.8.  SARS-CoV-2 PCR panel is ordered and pending.  Right rib x-rays negative for displaced rib fracture or other acute abnormality of the right ribs.  No pneumothorax or pleural effusion seen.  MRI thoracic and lumbar spine without contrast showed progressed T11 compression fracture with loss of height increased from 20% to 40%.  New retropulsion of bone resulting in mild spinal stenosis with mild cord mass-effect but no spinal cord signal abnormality also noted.  Acute to subacute posterior left rib fractures 9 through 11 are seen.  No acute  osseous abnormality in the lumbar spine seen.  Patient was given IV morphine 6 mg and 4 mg, IV Dilaudid 0.5 mg, K 40 mEq orally.  EDP discussed with on-call neurosurgery, Dr. Christella Noa who recommended medical admission for further neurosurgery evaluation and potential kyphoplasty after the weekend.  The hospitalist service was consulted to admit for further evaluation management.  Review of Systems: All systems reviewed and are negative except as documented in history of present illness above.   Past Medical History:  Diagnosis Date  . Hypertension     Past Surgical History:  Procedure Laterality Date  . KNEE SURGERY    . LUNG BIOPSY    . PARTIAL HYSTERECTOMY    . TONSILLECTOMY AND ADENOIDECTOMY      Social History:  reports that she has never smoked. She has never used smokeless tobacco. No history on file for alcohol use and drug use.  Allergies  Allergen Reactions  . Codeine Nausea And Vomiting  . Meperidine Nausea Only  . Oxycodone Nausea And Vomiting    Family History  Problem Relation Age of Onset  . Other Other        NO HEALTH ISSUES     Prior to Admission medications   Medication Sig Start Date End Date Taking? Authorizing Provider  amLODipine (NORVASC) 10 MG tablet Take 1 tablet (10 mg total) by mouth daily. 03/31/20   Mercy Riding, MD  dorzolamide (TRUSOPT) 2 % ophthalmic solution Place 1 drop into both eyes 2 (two) times daily.     [provider]  hydrochlorothiazide (MICROZIDE) 12.5 MG capsule  Take 1 capsule (12.5 mg total) by mouth daily. *Please call and schedule a one year follow up appointment* 06/02/16   Belva Crome, MD  losartan (COZAAR) 50 MG tablet Take 50 mg by mouth daily. 03/15/20   [provider]  polyethylene glycol (MIRALAX / GLYCOLAX) 17 g packet Take 17 g by mouth 2 (two) times daily as needed for mild constipation (use first). 03/30/20   Mercy Riding, MD  rOPINIRole (REQUIP) 0.25 MG tablet Take 0.25 mg by mouth 3 (three)  times daily as needed. Restless leg 03/05/20   [provider]  senna-docusate (SENOKOT-S) 8.6-50 MG tablet Take 1 tablet by mouth 2 (two) times daily between meals as needed for mild constipation. 03/30/20   Mercy Riding, MD  travoprost, benzalkonium, (TRAVATAN) 0.004 % ophthalmic solution Place 1 drop into both eyes at bedtime.     [provider]  warfarin (COUMADIN) 1 MG tablet Take 1 mg by mouth See admin instructions. Patient alternates with 1/2 tablet = 1.5mg  every other day. 02/19/20   [provider]  warfarin (COUMADIN) 3 MG tablet Take 1.5 mg by mouth See admin instructions. Patient alternates with 1 mg every other day     [provider]    Physical Exam: Vitals:   05/01/20 1541 05/01/20 1600 05/01/20 1730 05/01/20 1830  BP: (!) 159/97 (!) 168/105 (!) 153/92 (!) 182/94  Pulse: 83 80 79 85  Resp: 18 15 18 18   Temp:      TempSrc:      SpO2: 96% 92% 94% 98%  Weight:      Height:       Constitutional: Obese elderly woman resting supine in bed, NAD, calm, comfortable Eyes: PERRL, lids and conjunctivae normal ENMT: Mucous membranes are moist. Posterior pharynx clear of any exudate or lesions.Normal dentition.  Neck: normal, supple, no masses. Respiratory: clear to auscultation bilaterally, no wheezing, no crackles. Normal respiratory effort. No accessory muscle use.  Cardiovascular: Regular rate and rhythm, no murmurs / rubs / gallops. No extremity edema. 2+ pedal pulses. Abdomen: no tenderness, no masses palpated. No hepatosplenomegaly. Bowel sounds positive.  Musculoskeletal: no clubbing / cyanosis. No joint deformity upper and lower extremities. Good ROM of extremities while resting in bed, no contractures. Normal muscle tone.  Skin: no rashes, lesions, ulcers. No induration Neurologic: CN 2-12 grossly intact. Sensation intact, Strength 5/5 in all 4.  Psychiatric: Normal judgment and insight. Alert and oriented x 3. Normal mood.   Labs on  Admission: I have personally reviewed following labs and imaging studies  CBC: Recent Labs  Lab 05/01/20 1536  WBC 11.3*  NEUTROABS 9.2*  HGB 16.9*  HCT 49.8*  MCV 88.6  PLT [id number]   Basic Metabolic Panel: Recent Labs  Lab 05/01/20 1536  NA 134*  K 2.9*  CL 91*  CO2 30  GLUCOSE 130*  BUN 22  CREATININE 0.90  CALCIUM 9.0   GFR: Estimated Creatinine Clearance: 39.7 mL/min (by C-G formula based on SCr of 0.9 mg/dL). Liver Function Tests: No results for input(s): AST, ALT, ALKPHOS, BILITOT, PROT, ALBUMIN in the last 168 hours. No results for input(s): LIPASE, AMYLASE in the last 168 hours. No results for input(s): AMMONIA in the last 168 hours. Coagulation Profile: Recent Labs  Lab 05/01/20 1542  INR 2.8*   Cardiac Enzymes: No results for input(s): CKTOTAL, CKMB, CKMBINDEX, TROPONINI in the last 168 hours. BNP (last 3 results) No results for input(s): PROBNP in the last 8760 hours. HbA1C: No  results for input(s): HGBA1C in the last 72 hours. CBG: No results for input(s): GLUCAP in the last 168 hours. Lipid Profile: No results for input(s): CHOL, HDL, LDLCALC, TRIG, CHOLHDL, LDLDIRECT in the last 72 hours. Thyroid Function Tests: No results for input(s): TSH, T4TOTAL, FREET4, T3FREE, THYROIDAB in the last 72 hours. Anemia Panel: No results for input(s): VITAMINB12, FOLATE, FERRITIN, TIBC, IRON, RETICCTPCT in the last 72 hours. Urine analysis:    Component Value Date/Time   COLORURINE YELLOW 03/27/2020 [place] 03/27/2020 1032   LABSPEC 1.008 03/27/2020 1032   PHURINE 7.0 03/27/2020 1032   GLUCOSEU NEGATIVE 03/27/2020 1032   HGBUR NEGATIVE 03/27/2020 [place] 03/27/2020 1032   KETONESUR 5 (A) 03/27/2020 1032   PROTEINUR 100 (A) 03/27/2020 1032   NITRITE NEGATIVE 03/27/2020 1032   LEUKOCYTESUR NEGATIVE 03/27/2020 1032    Radiological Exams on Admission: DG Ribs Unilateral W/Chest Right  Result Date: 05/01/2020 CLINICAL  DATA:  Rib and back pain, recent T11 fracture EXAM: RIGHT RIBS AND CHEST - 3+ VIEW COMPARISON:  11/26/2017 FINDINGS: No displaced fracture or other bone lesions are seen involving the ribs. There is no evidence of pneumothorax or pleural effusion. Cardiomegaly. No acute appearing airspace opacity. Surgical clips and suture project over the right lung base. IMPRESSION: 1. No displaced rib fracture or other acute abnormality of the right ribs. No pneumothorax or pleural effusion. 2. The thoracic spine is not well imaged on frontal and oblique views provided per report of recent T11 fracture. 3. Cardiomegaly. 4. No acute appearing airspace opacity. Electronically Signed   By: Eddie Candle M.D.   On: 05/01/2020 14:54   MR THORACIC SPINE WO CONTRAST  Result Date: 05/01/2020 CLINICAL DATA:  86 year old female with recent T11 fracture. Rib and back pain. EXAM: MRI THORACIC AND LUMBAR SPINE WITHOUT CONTRAST TECHNIQUE: Multiplanar and multiecho pulse sequences of the thoracic and lumbar spine were obtained without intravenous contrast. COMPARISON:  Rib radiographs earlier 03/27/2020. Today. CT Abdomen and Pelvis FINDINGS: MRI THORACIC SPINE FINDINGS Limited cervical spine imaging: Previous cervical ACDF, appears to be at the C4-C5 levels. Thoracic spine segmentation: Appears to be normal aside from hypoplastic ribs at T12, as suggested on CT Abdomen and Pelvis last month. Alignment: Normal thoracic vertebral height and alignment above the T11 level, see below. Vertebrae: Thoracic vertebrae T1 through T8 are intact with normal marrow signal. T9 vertebral body appears intact but there is an acute to subacute fracture of the posterior left 9th rib with marrow edema (series 17, image 17 and series 21, image 30). T10 vertebra appears intact but there is a fracture of the posterior left 10th rib (same image on series 17 and series 21, image 33). Progress T11 compression fracture since last month with comminution, confluent  marrow edema, vertebral loss of height now up to 40% (previously less than 20%). New retropulsion of bone (series 17, image 10) with mild spinal stenosis (series 21, image 37). Mild associated spinal cord mass effect (series 20, image 37 and series 29, image 2). No spinal cord edema or myelomalacia suspected. Moderate to severe left and moderate right T11 foraminal stenosis also is increased. Superimposed left posterior levin through fracture suspected. The T12 vertebra appears intact with superimposed benign vertebral body hemangioma. Cord: Capacious thoracic spinal canal above T11. See T11 spinal stenosis and cord mass effect details above. No definite cord signal abnormality. Conus medullaris is at T12-L1. Paraspinal and other soft tissues: Left paraspinal soft tissue edema from the 9th  rib through T12 vertebral level. No drainable paraspinal fluid collection. Trace bilateral layering pleural effusions. Small round right lung base fat density nodule appears stable from last month. Disc levels: No age advanced degenerative changes superimposed on the acute osseous abnormalities detailed above. MRI LUMBAR SPINE FINDINGS Segmentation:  Normal. Alignment: Moderate levoconvex lumbar scoliosis appears stable from last month. Stable lumbar vertebral height and alignment. Vertebrae: No marrow edema or evidence of acute osseous abnormality in the lumbar spine. Intact visible sacrum and SI joints. Conus medullaris and cauda equina: Conus extends to the T12-L1 level. Conus and cauda equina appear normal. Paraspinal and other soft tissues: Left posterolateral lower thoracic paraspinal soft tissue edema but no acute lumbar paraspinal soft tissue finding. Chronic lumbar paraspinal muscle atrophy. Visible abdominal viscera appear stable since last month. Disc levels: Advanced lumbar spine degeneration in the setting of levoconvex scoliosis. Up to mild associated lumbar spinal stenosis. IMPRESSION: MR THORACIC SPINE IMPRESSION  1. Progressed T11 compression fracture since last month. Increased comminution. Loss of height increased from 20% to 40%. And new retropulsion of bone resulting in mild spinal stenosis with mild cord mass effect but no spinal cord signal abnormality. 2. Associated acute to subacute posterior left rib fractures 9 through 11. 3. Left paraspinal soft tissue edema from T9 to T12. 4. Trace layering pleural effusions. MR LUMBAR SPINE IMPRESSION 1. No acute osseous abnormality in the lumbar spine. 2. Advanced lumbar spine degeneration the setting of moderate levoconvex lumbar scoliosis. Electronically Signed   By: Genevie Ann M.D.   On: 05/01/2020 17:50   MR LUMBAR SPINE WO CONTRAST  Result Date: 05/01/2020 CLINICAL DATA:  86 year old female with recent T11 fracture. Rib and back pain. EXAM: MRI THORACIC AND LUMBAR SPINE WITHOUT CONTRAST TECHNIQUE: Multiplanar and multiecho pulse sequences of the thoracic and lumbar spine were obtained without intravenous contrast. COMPARISON:  Rib radiographs earlier 03/27/2020. Today. CT Abdomen and Pelvis FINDINGS: MRI THORACIC SPINE FINDINGS Limited cervical spine imaging: Previous cervical ACDF, appears to be at the C4-C5 levels. Thoracic spine segmentation: Appears to be normal aside from hypoplastic ribs at T12, as suggested on CT Abdomen and Pelvis last month. Alignment: Normal thoracic vertebral height and alignment above the T11 level, see below. Vertebrae: Thoracic vertebrae T1 through T8 are intact with normal marrow signal. T9 vertebral body appears intact but there is an acute to subacute fracture of the posterior left 9th rib with marrow edema (series 17, image 17 and series 21, image 30). T10 vertebra appears intact but there is a fracture of the posterior left 10th rib (same image on series 17 and series 21, image 33). Progress T11 compression fracture since last month with comminution, confluent marrow edema, vertebral loss of height now up to 40% (previously less than  20%). New retropulsion of bone (series 17, image 10) with mild spinal stenosis (series 21, image 37). Mild associated spinal cord mass effect (series 20, image 37 and series 29, image 2). No spinal cord edema or myelomalacia suspected. Moderate to severe left and moderate right T11 foraminal stenosis also is increased. Superimposed left posterior levin through fracture suspected. The T12 vertebra appears intact with superimposed benign vertebral body hemangioma. Cord: Capacious thoracic spinal canal above T11. See T11 spinal stenosis and cord mass effect details above. No definite cord signal abnormality. Conus medullaris is at T12-L1. Paraspinal and other soft tissues: Left paraspinal soft tissue edema from the 9th rib through T12 vertebral level. No drainable paraspinal fluid collection. Trace bilateral layering pleural effusions. Small round  right lung base fat density nodule appears stable from last month. Disc levels: No age advanced degenerative changes superimposed on the acute osseous abnormalities detailed above. MRI LUMBAR SPINE FINDINGS Segmentation:  Normal. Alignment: Moderate levoconvex lumbar scoliosis appears stable from last month. Stable lumbar vertebral height and alignment. Vertebrae: No marrow edema or evidence of acute osseous abnormality in the lumbar spine. Intact visible sacrum and SI joints. Conus medullaris and cauda equina: Conus extends to the T12-L1 level. Conus and cauda equina appear normal. Paraspinal and other soft tissues: Left posterolateral lower thoracic paraspinal soft tissue edema but no acute lumbar paraspinal soft tissue finding. Chronic lumbar paraspinal muscle atrophy. Visible abdominal viscera appear stable since last month. Disc levels: Advanced lumbar spine degeneration in the setting of levoconvex scoliosis. Up to mild associated lumbar spinal stenosis. IMPRESSION: MR THORACIC SPINE IMPRESSION 1. Progressed T11 compression fracture since last month. Increased  comminution. Loss of height increased from 20% to 40%. And new retropulsion of bone resulting in mild spinal stenosis with mild cord mass effect but no spinal cord signal abnormality. 2. Associated acute to subacute posterior left rib fractures 9 through 11. 3. Left paraspinal soft tissue edema from T9 to T12. 4. Trace layering pleural effusions. MR LUMBAR SPINE IMPRESSION 1. No acute osseous abnormality in the lumbar spine. 2. Advanced lumbar spine degeneration the setting of moderate levoconvex lumbar scoliosis. Electronically Signed   By: Genevie Ann M.D.   On: 05/01/2020 17:50    EKG: Not performed.  Assessment/Plan Principal Problem:   Compression fracture of T11 vertebra (HCC) Active Problems:   Atrial fibrillation (HCC)   Essential hypertension, benign   Hypokalemia   Multiple fractures of ribs, left side, initial encounter for closed fracture  Danielle Thomas is a 86 y.o. female with medical history significant for atrial fibrillation on Coumadin, hypertension, restless leg syndrome who is admitted with uncontrolled back pain due to progressed T11 compression fracture.  Uncontrolled back pain due to progressed T11 compression fracture: -Neurosurgery consulted by EDP and to evaluate in hospital for potential kyphoplasty -Continue TLSO brace -PT/OT -Pain control with as needed Tylenol, Norco, Dilaudid with hold parameters  Atrial fibrillation: On Coumadin as an outpatient.  INR 2.8.  Hold Coumadin for now pending potential kyphoplasty.  Repeat INR in a.m.  Acute to subacute left 9-11 rib fractures: Seen on MRI.  Continue pain control as above.  Incentive spirometer.  Hypertension: Continue losartan.  HCTZ on hold due to hypokalemia.  Hypokalemia: Replete.  Check magnesium and replete as needed.  Restless leg syndrome: Continue ropinirole.  DVT prophylaxis: SCDs  Code Status: DNI, confirmed with patient Family Communication: Discussed with patient, she has discussed with  family Disposition Plan: From home, dispo pending further neurosurgery evaluation and recommendations, PT/OT eval Consults called: Neurosurgery Admission status:  Status is: Inpatient  Remains inpatient appropriate because:Ongoing active pain requiring inpatient pain management and Unsafe d/c plan   Dispo: The patient is from: Home              Anticipated d/c is to: Home versus SNF              Anticipated d/c date is: > 3 days              Patient currently is not medically stable to d/c.   Zada Finders MD [facility]  If 7PM-7AM, please contact night-coverage [email or web address]  05/01/2020, 6:50 PM

## 2020-05-01 NOTE — ED Notes (Signed)
Pt to xray

## 2020-05-02 DIAGNOSIS — E876 Hypokalemia: Secondary | ICD-10-CM | POA: Diagnosis not present

## 2020-05-02 DIAGNOSIS — I480 Paroxysmal atrial fibrillation: Secondary | ICD-10-CM | POA: Diagnosis not present

## 2020-05-02 DIAGNOSIS — S22080A Wedge compression fracture of T11-T12 vertebra, initial encounter for closed fracture: Secondary | ICD-10-CM | POA: Diagnosis not present

## 2020-05-02 DIAGNOSIS — S2242XA Multiple fractures of ribs, left side, initial encounter for closed fracture: Secondary | ICD-10-CM

## 2020-05-02 LAB — PROTIME-INR
INR: 3 — ABNORMAL HIGH (ref 0.8–1.2)
Prothrombin Time: 30.5 seconds — ABNORMAL HIGH (ref 11.4–15.2)

## 2020-05-02 LAB — URINALYSIS, ROUTINE W REFLEX MICROSCOPIC
Bilirubin Urine: NEGATIVE
Glucose, UA: NEGATIVE mg/dL
Ketones, ur: NEGATIVE mg/dL
Nitrite: NEGATIVE
Protein, ur: 100 mg/dL — AB
RBC / HPF: >50 RBC/hpf — ABNORMAL HIGH (ref 0–5)
Specific Gravity, Urine: 1.023 (ref 1.005–1.030)
WBC, UA: >50 WBC/hpf — ABNORMAL HIGH (ref 0–5)
pH: 5 (ref 5.0–8.0)

## 2020-05-02 LAB — BASIC METABOLIC PANEL
Anion gap: 10 (ref 5–15)
BUN: 23 mg/dL (ref 8–23)
CO2: 29 mmol/L (ref 22–32)
Calcium: 8.7 mg/dL — ABNORMAL LOW (ref 8.9–10.3)
Chloride: 96 mmol/L — ABNORMAL LOW (ref 98–111)
Creatinine, Ser: 0.94 mg/dL (ref 0.44–1.00)
GFR, Estimated: 58 mL/min — ABNORMAL LOW (ref >60)
Glucose, Bld: 100 mg/dL — ABNORMAL HIGH (ref 70–99)
Potassium: 4.5 mmol/L (ref 3.5–5.1)
Sodium: 135 mmol/L (ref 135–145)

## 2020-05-02 LAB — CBC
HCT: 47.1 % — ABNORMAL HIGH (ref 36.0–46.0)
Hemoglobin: 15.4 g/dL — ABNORMAL HIGH (ref 12.0–15.0)
MCH: 29.9 pg (ref 26.0–34.0)
MCHC: 32.7 g/dL (ref 30.0–36.0)
MCV: 91.5 fL (ref 80.0–100.0)
Platelets: 222 10*3/uL (ref 150–400)
RBC: 5.15 MIL/uL — ABNORMAL HIGH (ref 3.87–5.11)
RDW: 15.3 % (ref 11.5–15.5)
WBC: 12 10*3/uL — ABNORMAL HIGH (ref 4.0–10.5)
nRBC: 0 % (ref 0.0–0.2)

## 2020-05-02 MED ORDER — TRAVOPROST 0.004 % OP SOLN: 1.0000 [drp] | Freq: Every day | OPHTHALMIC | Status: DC

## 2020-05-02 MED ORDER — HYDROMORPHONE HCL 1 MG/ML IJ SOLN
0.5000 mg | INTRAMUSCULAR | Status: DC | PRN
  Administered 2020-05-02 – 2020-05-03 (×5): 1 mg via INTRAVENOUS
  Filled 2020-05-02 (×5): qty 1

## 2020-05-02 MED ORDER — SENNOSIDES-DOCUSATE SODIUM 8.6-50 MG PO TABS
1.0000 | ORAL_TABLET | Freq: Two times a day (BID) | ORAL | Status: DC
  Administered 2020-05-02 – 2020-05-13 (×13): 1 via ORAL
  Filled 2020-05-02 (×20): qty 1

## 2020-05-02 MED ORDER — DORZOLAMIDE HCL 2 % OP SOLN
1.0000 [drp] | Freq: Two times a day (BID) | OPHTHALMIC | Status: DC
  Administered 2020-05-03 – 2020-05-13 (×20): 1 [drp] via OPHTHALMIC
  Filled 2020-05-02: qty 10

## 2020-05-02 MED ORDER — SODIUM CHLORIDE 0.9 % IV SOLN
1.0000 g | INTRAVENOUS | Status: DC
  Administered 2020-05-02 – 2020-05-05 (×4): 1 g via INTRAVENOUS
  Filled 2020-05-02 (×4): qty 10
  Filled 2020-05-02: qty 1

## 2020-05-02 MED ORDER — LATANOPROST 0.005 % OP SOLN
1.0000 [drp] | Freq: Every day | OPHTHALMIC | Status: DC
  Administered 2020-05-02 – 2020-05-12 (×11): 1 [drp] via OPHTHALMIC
  Filled 2020-05-02: qty 2.5

## 2020-05-02 MED ORDER — SODIUM CHLORIDE 0.9 % IV SOLN
INTRAVENOUS | Status: DC | PRN
  Administered 2020-05-02: 20:00:00 250 mL via INTRAVENOUS

## 2020-05-02 MED ORDER — SODIUM CHLORIDE 0.9 % IV SOLN: 1.0000 g | INTRAVENOUS | Status: DC

## 2020-05-02 NOTE — Progress Notes (Addendum)
PROGRESS NOTE    Danielle Thomas  [id number] DOB: 09/14/1927 DOA: 05/01/2020 PCP: Cari Caraway, MD    Chief Complaint  Patient presents with  . Back Pain    Brief Narrative:  Danielle Thomas is a 86 y.o. female with medical history significant for atrial fibrillation on Coumadin, hypertension, restless leg syndrome who presents to the ED for evaluation of uncontrolled back pain.  Patient was recently admitted from 03/27/2020-03/29/2020 for multiple electrolyte abnormalities.  She was also found to have a T11 compression fracture which was managed with pain medication and TLSO brace.  She was discharged to SNF with plan for outpatient follow-up.  Patient states she was in SNF for 2 weeks before returning home.  She says that since she has been in the hospital she has been essentially bedbound due to difficulty ambulating secondary to continued back pain.  She says previously she would ambulate with the use of a walker.  She currently lives alone.  She otherwise denies any focal deficits or new weakness in her extremities.  Subjective:  C/o back pain, can only lay flat  Assessment & Plan:   Principal Problem:   Compression fracture of T11 vertebra (HCC) Active Problems:   Atrial fibrillation (HCC)   Essential hypertension, benign   Hypokalemia   Multiple fractures of ribs, left side, initial encounter for closed fracture  Danielle Thomas is a 86 y.o. female with medical history significant for atrial fibrillation on Coumadin, hypertension, restless leg syndrome who is admitted with uncontrolled back pain due to progressed T11 compression fracture.  Uncontrolled back pain due to progressed T11 compression fracture -Neurosurgery Dr. Christella Noa consulted by EDP for potential kyphoplasty, I do not see neurosurgery formal consult in chart, contacted on-call neurosurgery Dr. Marcello Moores who confirmed that Dr. Christella Noa will see patient at [facility] on Monday -TLSO, Pain control with bowel  regimen -Coumadin held on admission, monitor daily INR, will keep n.p.o. after midnight in case INR decreased to acceptable level for procedure   Leukocytosis No fever Likely from stress UA ordered  7:20pm addendum: ua returned is concerning for uti, obtain urine culture, then start rocephin.  Atrial fibrillation: On Coumadin as an outpatient.  INR 2.8.  Hold Coumadin for now pending potential kyphoplasty.  Repeat INR in a.m.  SMA and celiac trunk stenosis 70% stenosis involving the origin of SMA and celiac trunk. No signs of bowel ischemia. -Ambulatory referral to vascular surgery   Acute to subacute left 9-11 rib fractures: Seen on MRI.  Continue pain control as above.  Incentive spirometer.  Hypertension: Continue losartan.  HCTZ on hold due to hypokalemia.  Hypokalemia: Repleted.  magnesium 1.9 , continue hold HCTZ, monitor.  Restless leg syndrome: Continue ropinirole.  FTT Patient report was previously independent prior to hospitalization in November However she is not able to get out of bed due to pain currently Will need PT OT eval once pain improves   DVT prophylaxis: Place and maintain sequential compression device Start: 05/01/20 2152   Code Status: No intubation, yes to CPR Family Communication: Called  daughter over the phone and left a message, daughter called back later, she is updated over the phone Disposition:   Status is: Inpatient  Dispo: The patient is from: SNF              Anticipated d/c is to: SNF              Anticipated d/c date is: TBD  Consultants:   Neurosurgery   Procedures:   none  Antimicrobials:   none     Objective: Vitals:   05/01/20 2044 05/02/20 0112 05/02/20 0500 05/02/20 0613  BP: (!) 161/90 (!) 160/95  (!) 165/95  Pulse: 80 87  85  Resp: 20 14  15   Temp: 97.7 F (36.5 C) 98.5 F (36.9 C)  97.9 F (36.6 C)  TempSrc: Oral Oral  Oral  SpO2: 98% 95%  97%  Weight: 68 kg  68.5 kg    Height: 5\' 3"  (1.6 m)       Intake/Output Summary (Last 24 hours) at 05/02/2020 1039 Last data filed at 05/02/2020 0917 Gross per 24 hour  Intake 580 ml  Output 100 ml  Net 480 ml   Filed Weights   05/01/20 1240 05/01/20 2044 05/02/20 0500  Weight: 72.7 kg 68 kg 68.5 kg    Examination:  General exam: Frail , anxious , AAOx3  Respiratory system: Clear to auscultation. Respiratory effort normal. Cardiovascular system: S1 & S2 heard, IRRR. No pedal edema. Gastrointestinal system: Abdomen is nondistended, soft and nontender.  Normal bowel sounds heard. Central nervous system: Alert and oriented. No focal neurological deficits. Extremities: Generalized weakness, moving all extremities, no edema's sodium Skin: Scattered ecchymosis Psychiatry: Judgement and insight appear normal. Mood & affect appropriate.     Data Reviewed: I have personally reviewed following labs and imaging studies  CBC: Recent Labs  Lab 05/01/20 1536 05/02/20 0236  WBC 11.3* 12.0*  NEUTROABS 9.2*  --   HGB 16.9* 15.4*  HCT 49.8* 47.1*  MCV 88.6 91.5  PLT 232 [id number]    Basic Metabolic Panel: Recent Labs  Lab 05/01/20 1536 05/01/20 2131 05/02/20 0236  NA 134*  --  135  K 2.9*  --  4.5  CL 91*  --  96*  CO2 30  --  29  GLUCOSE 130*  --  100*  BUN 22  --  23  CREATININE 0.90  --  0.94  CALCIUM 9.0  --  8.7*  MG  --  1.9  --     GFR: Estimated Creatinine Clearance: 36.9 mL/min (by C-G formula based on SCr of 0.94 mg/dL).  Liver Function Tests: No results for input(s): AST, ALT, ALKPHOS, BILITOT, PROT, ALBUMIN in the last 168 hours.  CBG: No results for input(s): GLUCAP in the last 168 hours.   Recent Results (from the past 240 hour(s))  Resp Panel by RT-PCR (Flu A&B, Covid) Nasopharyngeal Swab     Status: None   Collection Time: 05/01/20  6:23 PM   Specimen: Nasopharyngeal Swab; Nasopharyngeal(NP) swabs in vial transport medium  Result Value Ref Range Status   SARS Coronavirus 2 by  RT PCR NEGATIVE NEGATIVE Final    Comment: (NOTE) SARS-CoV-2 target nucleic acids are NOT DETECTED.  The SARS-CoV-2 RNA is generally detectable in upper respiratory specimens during the acute phase of infection. The lowest concentration of SARS-CoV-2 viral copies this assay can detect is 138 copies/mL. A negative result does not preclude SARS-Cov-2 infection and should not be used as the sole basis for treatment or other patient management decisions. A negative result may occur with  improper specimen collection/handling, submission of specimen other than nasopharyngeal swab, presence of viral mutation(s) within the areas targeted by this assay, and inadequate number of viral copies(<138 copies/mL). A negative result must be combined with clinical observations, patient history, and epidemiological information. The expected result is Negative.  Fact Sheet for Patients:  EntrepreneurPulse.com.au  Fact Sheet  for Healthcare Providers:  IncredibleEmployment.be  This test is no t yet approved or cleared by the [place] and  has been authorized for detection and/or diagnosis of SARS-CoV-2 by FDA under an Emergency Use Authorization (EUA). This EUA will remain  in effect (meaning this test can be used) for the duration of the COVID-19 declaration under Section 564(b)(1) of the Act, 21 U.S.C.section 360bbb-3(b)(1), unless the authorization is terminated  or revoked sooner.       Influenza A by PCR NEGATIVE NEGATIVE Final   Influenza B by PCR NEGATIVE NEGATIVE Final    Comment: (NOTE) The Xpert Xpress SARS-CoV-2/FLU/RSV plus assay is intended as an aid in the diagnosis of influenza from Nasopharyngeal swab specimens and should not be used as a sole basis for treatment. Nasal washings and aspirates are unacceptable for Xpert Xpress SARS-CoV-2/FLU/RSV testing.  Fact Sheet for Patients: EntrepreneurPulse.com.au  Fact Sheet  for Healthcare Providers: IncredibleEmployment.be  This test is not yet approved or cleared by the [place] FDA and has been authorized for detection and/or diagnosis of SARS-CoV-2 by FDA under an Emergency Use Authorization (EUA). This EUA will remain in effect (meaning this test can be used) for the duration of the COVID-19 declaration under Section 564(b)(1) of the Act, 21 U.S.C. section 360bbb-3(b)(1), unless the authorization is terminated or revoked.  Performed at [facility], [place] [address], [place], [place] [address]          Radiology Studies: DG Ribs Unilateral W/Chest Right  Result Date: 05/01/2020 CLINICAL DATA:  Rib and back pain, recent T11 fracture EXAM: RIGHT RIBS AND CHEST - 3+ VIEW COMPARISON:  11/26/2017 FINDINGS: No displaced fracture or other bone lesions are seen involving the ribs. There is no evidence of pneumothorax or pleural effusion. Cardiomegaly. No acute appearing airspace opacity. Surgical clips and suture project over the right lung base. IMPRESSION: 1. No displaced rib fracture or other acute abnormality of the right ribs. No pneumothorax or pleural effusion. 2. The thoracic spine is not well imaged on frontal and oblique views provided per report of recent T11 fracture. 3. Cardiomegaly. 4. No acute appearing airspace opacity. Electronically Signed   By: Eddie Candle M.D.   On: 05/01/2020 14:54   MR THORACIC SPINE WO CONTRAST  Result Date: 05/01/2020 CLINICAL DATA:  86 year old female with recent T11 fracture. Rib and back pain. EXAM: MRI THORACIC AND LUMBAR SPINE WITHOUT CONTRAST TECHNIQUE: Multiplanar and multiecho pulse sequences of the thoracic and lumbar spine were obtained without intravenous contrast. COMPARISON:  Rib radiographs earlier 03/27/2020. Today. CT Abdomen and Pelvis FINDINGS: MRI THORACIC SPINE FINDINGS Limited cervical spine imaging: Previous cervical ACDF, appears to be at the C4-C5 levels.  Thoracic spine segmentation: Appears to be normal aside from hypoplastic ribs at T12, as suggested on CT Abdomen and Pelvis last month. Alignment: Normal thoracic vertebral height and alignment above the T11 level, see below. Vertebrae: Thoracic vertebrae T1 through T8 are intact with normal marrow signal. T9 vertebral body appears intact but there is an acute to subacute fracture of the posterior left 9th rib with marrow edema (series 17, image 17 and series 21, image 30). T10 vertebra appears intact but there is a fracture of the posterior left 10th rib (same image on series 17 and series 21, image 33). Progress T11 compression fracture since last month with comminution, confluent marrow edema, vertebral loss of height now up to 40% (previously less than 20%). New retropulsion of bone (series 17, image 10) with mild spinal  stenosis (series 21, image 37). Mild associated spinal cord mass effect (series 20, image 37 and series 29, image 2). No spinal cord edema or myelomalacia suspected. Moderate to severe left and moderate right T11 foraminal stenosis also is increased. Superimposed left posterior levin through fracture suspected. The T12 vertebra appears intact with superimposed benign vertebral body hemangioma. Cord: Capacious thoracic spinal canal above T11. See T11 spinal stenosis and cord mass effect details above. No definite cord signal abnormality. Conus medullaris is at T12-L1. Paraspinal and other soft tissues: Left paraspinal soft tissue edema from the 9th rib through T12 vertebral level. No drainable paraspinal fluid collection. Trace bilateral layering pleural effusions. Small round right lung base fat density nodule appears stable from last month. Disc levels: No age advanced degenerative changes superimposed on the acute osseous abnormalities detailed above. MRI LUMBAR SPINE FINDINGS Segmentation:  Normal. Alignment: Moderate levoconvex lumbar scoliosis appears stable from last month. Stable lumbar  vertebral height and alignment. Vertebrae: No marrow edema or evidence of acute osseous abnormality in the lumbar spine. Intact visible sacrum and SI joints. Conus medullaris and cauda equina: Conus extends to the T12-L1 level. Conus and cauda equina appear normal. Paraspinal and other soft tissues: Left posterolateral lower thoracic paraspinal soft tissue edema but no acute lumbar paraspinal soft tissue finding. Chronic lumbar paraspinal muscle atrophy. Visible abdominal viscera appear stable since last month. Disc levels: Advanced lumbar spine degeneration in the setting of levoconvex scoliosis. Up to mild associated lumbar spinal stenosis. IMPRESSION: MR THORACIC SPINE IMPRESSION 1. Progressed T11 compression fracture since last month. Increased comminution. Loss of height increased from 20% to 40%. And new retropulsion of bone resulting in mild spinal stenosis with mild cord mass effect but no spinal cord signal abnormality. 2. Associated acute to subacute posterior left rib fractures 9 through 11. 3. Left paraspinal soft tissue edema from T9 to T12. 4. Trace layering pleural effusions. MR LUMBAR SPINE IMPRESSION 1. No acute osseous abnormality in the lumbar spine. 2. Advanced lumbar spine degeneration the setting of moderate levoconvex lumbar scoliosis. Electronically Signed   By: Genevie Ann M.D.   On: 05/01/2020 17:50   MR LUMBAR SPINE WO CONTRAST  Result Date: 05/01/2020 CLINICAL DATA:  86 year old female with recent T11 fracture. Rib and back pain. EXAM: MRI THORACIC AND LUMBAR SPINE WITHOUT CONTRAST TECHNIQUE: Multiplanar and multiecho pulse sequences of the thoracic and lumbar spine were obtained without intravenous contrast. COMPARISON:  Rib radiographs earlier 03/27/2020. Today. CT Abdomen and Pelvis FINDINGS: MRI THORACIC SPINE FINDINGS Limited cervical spine imaging: Previous cervical ACDF, appears to be at the C4-C5 levels. Thoracic spine segmentation: Appears to be normal aside from hypoplastic  ribs at T12, as suggested on CT Abdomen and Pelvis last month. Alignment: Normal thoracic vertebral height and alignment above the T11 level, see below. Vertebrae: Thoracic vertebrae T1 through T8 are intact with normal marrow signal. T9 vertebral body appears intact but there is an acute to subacute fracture of the posterior left 9th rib with marrow edema (series 17, image 17 and series 21, image 30). T10 vertebra appears intact but there is a fracture of the posterior left 10th rib (same image on series 17 and series 21, image 33). Progress T11 compression fracture since last month with comminution, confluent marrow edema, vertebral loss of height now up to 40% (previously less than 20%). New retropulsion of bone (series 17, image 10) with mild spinal stenosis (series 21, image 37). Mild associated spinal cord mass effect (series 20, image 37 and series  29, image 2). No spinal cord edema or myelomalacia suspected. Moderate to severe left and moderate right T11 foraminal stenosis also is increased. Superimposed left posterior levin through fracture suspected. The T12 vertebra appears intact with superimposed benign vertebral body hemangioma. Cord: Capacious thoracic spinal canal above T11. See T11 spinal stenosis and cord mass effect details above. No definite cord signal abnormality. Conus medullaris is at T12-L1. Paraspinal and other soft tissues: Left paraspinal soft tissue edema from the 9th rib through T12 vertebral level. No drainable paraspinal fluid collection. Trace bilateral layering pleural effusions. Small round right lung base fat density nodule appears stable from last month. Disc levels: No age advanced degenerative changes superimposed on the acute osseous abnormalities detailed above. MRI LUMBAR SPINE FINDINGS Segmentation:  Normal. Alignment: Moderate levoconvex lumbar scoliosis appears stable from last month. Stable lumbar vertebral height and alignment. Vertebrae: No marrow edema or evidence of  acute osseous abnormality in the lumbar spine. Intact visible sacrum and SI joints. Conus medullaris and cauda equina: Conus extends to the T12-L1 level. Conus and cauda equina appear normal. Paraspinal and other soft tissues: Left posterolateral lower thoracic paraspinal soft tissue edema but no acute lumbar paraspinal soft tissue finding. Chronic lumbar paraspinal muscle atrophy. Visible abdominal viscera appear stable since last month. Disc levels: Advanced lumbar spine degeneration in the setting of levoconvex scoliosis. Up to mild associated lumbar spinal stenosis. IMPRESSION: MR THORACIC SPINE IMPRESSION 1. Progressed T11 compression fracture since last month. Increased comminution. Loss of height increased from 20% to 40%. And new retropulsion of bone resulting in mild spinal stenosis with mild cord mass effect but no spinal cord signal abnormality. 2. Associated acute to subacute posterior left rib fractures 9 through 11. 3. Left paraspinal soft tissue edema from T9 to T12. 4. Trace layering pleural effusions. MR LUMBAR SPINE IMPRESSION 1. No acute osseous abnormality in the lumbar spine. 2. Advanced lumbar spine degeneration the setting of moderate levoconvex lumbar scoliosis. Electronically Signed   By: Genevie Ann M.D.   On: 05/01/2020 17:50        Scheduled Meds: . dorzolamide  1 drop Both Eyes BID  . feeding supplement  237 mL Oral BID BM  . losartan  50 mg Oral Daily  . travoprost (benzalkonium)  1 drop Both Eyes QHS   Continuous Infusions:   LOS: 1 day   Time spent: 38mins, case discussed with neurosurgery Dr. Marcello Moores over the phone Greater than 50% of this time was spent in counseling, explanation of diagnosis, planning of further management, and coordination of care.  I have personally reviewed and interpreted on  05/02/2020 daily labs,I reviewed all nursing notes, pharmacy notes,  vitals, pertinent old records  I have discussed plan of care as described above with RN , patient and  family on 05/02/2020  Voice Recognition /Dragon dictation system was used to create this note, attempts have been made to correct errors. Please contact the author with questions and/or clarifications.   Florencia Reasons, MD PhD FACP [facility]  Available via Epic secure chat 7am-7pm for nonurgent issues Please page for urgent issues To page the attending provider between 7A-7P or the covering provider during after hours 7P-7A, please log into the web site [email or web address] and access using universal [place] password for that web site. If you do not have the password, please call the hospital operator.    05/02/2020, 10:39 AM

## 2020-05-02 NOTE — Progress Notes (Signed)
OT Cancellation Note  Patient Details Name: CLOEY SFERRAZZA MRN: [id number] DOB: 07-Nov-1927   Cancelled Treatment:    Reason Eval/Treat Not Completed: Other (comment) OT eval deferred 2* pt ongoing pain level.  Per MRI, pt with worsening of recent T-11 compression fx and will be assessed for possible kyphoplasty tomorrow. Will follow.  Tyrone Schimke, OT Acute Rehabilitation Services Pager: [phone] Office: [phone]  05/02/2020, 10:41 AM

## 2020-05-02 NOTE — Progress Notes (Signed)
PT Cancellation Note  Patient Details Name: TAMARIA DUNLEAVY MRN: [id number] DOB: 01-May-1928   Cancelled Treatment:     PT order received but eval deferred 2* pt ongoing pain level.  Per MRI, pt with worsening of recent T-11 compression fx and will be assessed for possible kyphoplasty tomorrow.  Will follow.   Rhylynn Perdomo 05/02/2020, 9:02 AM

## 2020-05-02 NOTE — Plan of Care (Signed)
  Problem: Clinical Measurements: Goal: Diagnostic test results will improve Outcome: Progressing   Problem: Clinical Measurements: Goal: Respiratory complications will improve Outcome: Progressing   Problem: Coping: Goal: Level of anxiety will decrease Outcome: Progressing   Problem: Elimination: Goal: Will not experience complications related to bowel motility Outcome: Progressing   Problem: Pain Managment: Goal: General experience of comfort will improve Outcome: Progressing   Problem: Safety: Goal: Ability to remain free from injury will improve Outcome: Progressing   Problem: Skin Integrity: Goal: Risk for impaired skin integrity will decrease Outcome: Progressing

## 2020-05-02 NOTE — Plan of Care (Signed)
  Problem: Education: Goal: Knowledge of General Education information will improve Description Including pain rating scale, medication(s)/side effects and non-pharmacologic comfort measures Outcome: Progressing   

## 2020-05-03 ENCOUNTER — Other Ambulatory Visit: Payer: Self-pay | Admitting: Neurosurgery

## 2020-05-03 DIAGNOSIS — S22080A Wedge compression fracture of T11-T12 vertebra, initial encounter for closed fracture: Secondary | ICD-10-CM | POA: Diagnosis not present

## 2020-05-03 LAB — CBC WITH DIFFERENTIAL/PLATELET
Abs Immature Granulocytes: 0.06 10*3/uL (ref 0.00–0.07)
Basophils Absolute: 0.1 10*3/uL (ref 0.0–0.1)
Basophils Relative: 1 %
Eosinophils Absolute: 0.3 10*3/uL (ref 0.0–0.5)
Eosinophils Relative: 3 %
HCT: 49.3 % — ABNORMAL HIGH (ref 36.0–46.0)
Hemoglobin: 16.2 g/dL — ABNORMAL HIGH (ref 12.0–15.0)
Immature Granulocytes: 1 %
Lymphocytes Relative: 9 %
Lymphs Abs: 1 10*3/uL (ref 0.7–4.0)
MCH: 30.3 pg (ref 26.0–34.0)
MCHC: 32.9 g/dL (ref 30.0–36.0)
MCV: 92.3 fL (ref 80.0–100.0)
Monocytes Absolute: 1.1 10*3/uL — ABNORMAL HIGH (ref 0.1–1.0)
Monocytes Relative: 10 %
Neutro Abs: 8.3 10*3/uL — ABNORMAL HIGH (ref 1.7–7.7)
Neutrophils Relative %: 76 %
Platelets: 231 10*3/uL (ref 150–400)
RBC: 5.34 MIL/uL — ABNORMAL HIGH (ref 3.87–5.11)
RDW: 15.6 % — ABNORMAL HIGH (ref 11.5–15.5)
WBC: 10.8 10*3/uL — ABNORMAL HIGH (ref 4.0–10.5)
nRBC: 0 % (ref 0.0–0.2)

## 2020-05-03 LAB — BASIC METABOLIC PANEL
Anion gap: 10 (ref 5–15)
BUN: 28 mg/dL — ABNORMAL HIGH (ref 8–23)
CO2: 30 mmol/L (ref 22–32)
Calcium: 8.8 mg/dL — ABNORMAL LOW (ref 8.9–10.3)
Chloride: 94 mmol/L — ABNORMAL LOW (ref 98–111)
Creatinine, Ser: 0.93 mg/dL (ref 0.44–1.00)
GFR, Estimated: 58 mL/min — ABNORMAL LOW (ref >60)
Glucose, Bld: 105 mg/dL — ABNORMAL HIGH (ref 70–99)
Potassium: 3.9 mmol/L (ref 3.5–5.1)
Sodium: 134 mmol/L — ABNORMAL LOW (ref 135–145)

## 2020-05-03 LAB — PROTIME-INR
INR: 4 — ABNORMAL HIGH (ref 0.8–1.2)
Prothrombin Time: 37.5 seconds — ABNORMAL HIGH (ref 11.4–15.2)

## 2020-05-03 MED ORDER — ENSURE ENLIVE PO LIQD
237.0000 mL | Freq: Two times a day (BID) | ORAL | Status: DC
  Administered 2020-05-03 – 2020-05-13 (×17): 237 mL via ORAL
  Filled 2020-05-03: qty 237

## 2020-05-03 MED ORDER — METHOCARBAMOL 1000 MG/10ML IJ SOLN
500.0000 mg | Freq: Four times a day (QID) | INTRAVENOUS | Status: DC | PRN
  Administered 2020-05-03: 15:00:00 500 mg via INTRAVENOUS
  Filled 2020-05-03: qty 5
  Filled 2020-05-03: qty 500

## 2020-05-03 MED ORDER — PHYTONADIONE 5 MG PO TABS
10.0000 mg | ORAL_TABLET | Freq: Every day | ORAL | Status: DC
  Administered 2020-05-03: 18:00:00 10 mg via ORAL
  Filled 2020-05-03 (×2): qty 2

## 2020-05-03 MED ORDER — HYDROCHLOROTHIAZIDE 12.5 MG PO CAPS
12.5000 mg | ORAL_CAPSULE | Freq: Every day | ORAL | Status: DC
Start: 2020-05-03 — End: 2020-05-05
  Administered 2020-05-03 – 2020-05-05 (×2): 12.5 mg via ORAL
  Filled 2020-05-03 (×2): qty 1

## 2020-05-03 NOTE — Progress Notes (Signed)
Initial Nutrition Assessment  INTERVENTION:   -Ensure Enlive po BID, each supplement provides 350 kcal and 20 grams of protein  NUTRITION DIAGNOSIS:   Inadequate oral intake related to poor appetite as evidenced by per patient/family report.  GOAL:   Patient will meet greater than or equal to 90% of their needs  MONITOR:   PO intake,Supplement acceptance,Labs,Weight trends,I & O's  REASON FOR ASSESSMENT:   Malnutrition Screening Tool    ASSESSMENT:   86 year old with history of A. fib on Coumadin, hypertension, restless leg syndrome who presents to the hospital for evaluation of uncontrolled back pain.  She has a known compression fracture on T11 which was diagnosed in November 2021 and was managed with pain medications, TLSO brace and was following with neurosurgery as an outpatient.  She was recently returned to home from a skilled nursing facility and after that she has been having severe back pain, essentially bedbound, unable to ambulate.  Patient in room, lying on her side, in obvious discomfort. Pt upset that procedures are being postponed for another day. Per neurosurgery note, pt to be transferred to [facility].   Pt reports trying to eat 3 meals a day but has not been eating well recently d/t pain over the past 2 months.  Pt consumed 80% of lunch today. Consumed 25-90% of meals yesterday. She has been drinking the Ensures but was denied her morning one d/t NPO status. She has since been provided once this afternoon.  Per weight records, pt has lost 6 lbs since 11/20 (3% wt loss x 1 month, insignificant for time frame).   Admission weight: 160 lbs. Current weight: 153 lbs.  Medications: Vitamin K, Senokot   Labs reviewed: Low Na  NUTRITION - FOCUSED PHYSICAL EXAM:  Declined given pain and upset about situation  Diet Order:   Diet Order            Diet Heart Room service appropriate? Yes; Fluid consistency: Thin  Diet effective now                 EDUCATION  NEEDS:   No education needs have been identified at this time  Skin:  Skin Assessment: Reviewed RN Assessment  Last BM:  12/21  Height:   Ht Readings from Last 1 Encounters:  05/01/20 5\' 3"  (1.6 m)    Weight:   Wt Readings from Last 1 Encounters:  05/03/20 69.5 kg   BMI:  Body mass index is 27.14 kg/m.  Estimated Nutritional Needs:   Kcal:  1550-1750  Protein:  75-85g  Fluid:  1.7L/day  05/05/20, MS, RD, LDN Inpatient Clinical Dietitian Contact information available via Amion

## 2020-05-03 NOTE — Progress Notes (Signed)
PT Cancellation Note  Patient Details Name: AIZZA SANTIAGO MRN: [id number] DOB: 01/23/1928   Cancelled Treatment:    Reason Eval/Treat Not Completed: Patient not medically ready, to be assessed by neurosurgery  for ? KP/VP. Will follow.   Whittaker Lenis Bowbells Pager [phone] Office [phone]  05/03/2020, 6:27 AM

## 2020-05-03 NOTE — Progress Notes (Addendum)
PROGRESS NOTE    Danielle Thomas  [id number] DOB: 03/24/1928 DOA: 05/01/2020 PCP: Cari Caraway, MD   Chief Complain: Back pain  Brief Narrative: Patient is a 86 year old with history of A. fib on Coumadin, hypertension, restless leg syndrome who presents to the hospital for evaluation of uncontrolled back pain.  She has a known compression fracture on T11 which was diagnosed in November 2021 and was managed with pain medications, TLSO brace and was following with orthopedics as an outpatient.  She was recently returned to home from a skilled nursing facility and after that she has been having severe back pain, essentially bedbound, unable to ambulate.  She lives alone.  Neurosurgery consulted.  PT/OT consultation will be done after neurosurgery intervention.  Assessment & Plan:   Principal Problem:   Compression fracture of T11 vertebra (HCC) Active Problems:   Atrial fibrillation (HCC)   Essential hypertension, benign   Hypokalemia   Multiple fractures of ribs, left side, initial encounter for closed fracture   Uncontrolled back pain due to T11 compression fracture: Continue  TLSO brace, pain control.  Patient has a known T11 compression fracture and following with orthopedics as an outpatient.  MRI done here showed progression of the compression fracture, increased loss of height. It also showed new retropulsion of bone resulting in mild spinal stenosis with mild cord mass effect but no spinal cord signal abnormality .Dr. Christella Noa, neurosurgery,planning on percutaneous pedicle screws and a T10 to T12 arthrodesis tomorrow. We will consult  PT/OT after procedure. Added Robaxin.  Leukocytosis/UTI: Currently afebrile.  UA suspicious for UTI.  Started on Rocephin.  Follow-up urine culture  Afib:   On Coumadin for anticoagulation.  INR is supratherapeutic.  Continue to monitor.Since INR is 4 today,neurosugery ordered oral Vitamin K for surgery tomorrow.  Acute to subacute left 9-11 rib  fractures: Seen on MRI.  Continue supportive care, pain control, incentive spirometer.She might have fallen at home  Hypertension: Continue losartan.  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   Incidentally seen on the CT scan done on 11/21. No signs of bowel ischemia.  Outpatient follow-up with vascular surgery   Debility/deconditioning: PT/OT will be consulted.  She was previously living in a skilled facility.  Currently lives alone.  Bedbound with back pain.  Previously using walker.  Addendum: Spoke with Dr. Christella Noa later this afternoon.  He wants the patient to be transferred to [facility] for neurosurgical intervention tomorrow.         DVT prophylaxis:SCD Code Status: Full Family Communication: Discussed with daughter on phone today Status is: Inpatient  Remains inpatient appropriate because:Inpatient level of care appropriate due to severity of illness   Dispo: The patient is from: Home              Anticipated d/c is to: SNF              Anticipated d/c date is: 2 days              Patient currently is not medically stable to d/c.    Consultants: Neurosurgery  Procedures:None  Antimicrobials:  Anti-infectives (From admission, onward)   Start     Dose/Rate Route Frequency Ordered Stop   05/02/20 2200  cefTRIAXone (ROCEPHIN) 1 g in sodium chloride 0.9 % 100 mL IVPB        1 g 200 mL/hr over 30  Minutes Intravenous Every 24 hours 05/02/20 1919     05/02/20 2015  cefTRIAXone (ROCEPHIN) 1 g in sodium chloride 0.9 % 100 mL IVPB  Status:  Discontinued        1 g 200 mL/hr over 30 Minutes Intravenous Every 24 hours 05/02/20 1918 05/02/20 1919      Subjective: Patient seen and examined at the bedside this afternoon.  Hemodynamically stable during my evaluation.  Continues to complain of severe back pain but overall  looks comfortable.  Objective: Vitals:   05/02/20 2049 05/03/20 0508 05/03/20 0529 05/03/20 1329  BP: (!) 150/89 (!) 165/94  (!) 142/82  Pulse: 76 96  92  Resp: 15 20  17   Temp: 98.4 F (36.9 C) 97.6 F (36.4 C)  97.6 F (36.4 C)  TempSrc: Oral Oral  Oral  SpO2: 97% 99%  95%  Weight:   69.5 kg   Height:        Intake/Output Summary (Last 24 hours) at 05/03/2020 1358 Last data filed at 05/03/2020 1346 Gross per 24 hour  Intake 910.92 ml  Output 675 ml  Net 235.92 ml   Filed Weights   05/01/20 2044 05/02/20 0500 05/03/20 0529  Weight: 68 kg 68.5 kg 69.5 kg    Examination:  General exam: Pleasant elderly female  HEENT:PERRL,Oral mucosa moist, Ear/Nose normal on gross exam Respiratory system: Bilateral equal air entry, normal vesicular breath sounds, no wheezes or crackles  Cardiovascular system: Irregularly irregular rhythm. No JVD, murmurs, rubs, gallops or clicks. No pedal edema. Gastrointestinal system: Abdomen is nondistended, soft and nontender. No organomegaly or masses felt. Normal bowel sounds heard. Central nervous system: Alert and oriented. No focal neurological deficits. Extremities: No edema, no clubbing ,no cyanosis Skin: No rashes, lesions or ulcers,no icterus ,no pallor   Data Reviewed: I have personally reviewed following labs and imaging studies  CBC: Recent Labs  Lab 05/01/20 1536 05/02/20 0236 05/03/20 0201  WBC 11.3* 12.0* 10.8*  NEUTROABS 9.2*  --  8.3*  HGB 16.9* 15.4* 16.2*  HCT 49.8* 47.1* 49.3*  MCV 88.6 91.5 92.3  PLT 232 222 [id number]   Basic Metabolic Panel: Recent Labs  Lab 05/01/20 1536 05/01/20 2131 05/02/20 0236 05/03/20 0201  NA 134*  --  135 134*  K 2.9*  --  4.5 3.9  CL 91*  --  96* 94*  CO2 30  --  29 30  GLUCOSE 130*  --  100* 105*  BUN 22  --  23 28*  CREATININE 0.90  --  0.94 0.93  CALCIUM 9.0  --  8.7* 8.8*  MG  --  1.9  --   --    GFR: Estimated Creatinine Clearance: 37.6 mL/min (by C-G formula based on SCr of  0.93 mg/dL). Liver Function Tests: No results for input(s): AST, ALT, ALKPHOS, BILITOT, PROT, ALBUMIN in the last 168 hours. No results for input(s): LIPASE, AMYLASE in the last 168 hours. No results for input(s): AMMONIA in the last 168 hours. Coagulation Profile: Recent Labs  Lab 05/01/20 1542 05/02/20 0236 05/03/20 0201  INR 2.8* 3.0* 4.0*   Cardiac Enzymes: No results for input(s): CKTOTAL, CKMB, CKMBINDEX, TROPONINI in the last 168 hours. BNP (last 3 results) No results for input(s): PROBNP in the last 8760 hours. HbA1C: No results for input(s): HGBA1C in the last 72 hours. CBG: No results for input(s): GLUCAP in the last 168 hours. Lipid Profile: No results for input(s): CHOL, HDL, LDLCALC, TRIG, CHOLHDL, LDLDIRECT in the last 72 hours. Thyroid  Function Tests: No results for input(s): TSH, T4TOTAL, FREET4, T3FREE, THYROIDAB in the last 72 hours. Anemia Panel: No results for input(s): VITAMINB12, FOLATE, FERRITIN, TIBC, IRON, RETICCTPCT in the last 72 hours. Sepsis Labs: No results for input(s): PROCALCITON, LATICACIDVEN in the last 168 hours.  Recent Results (from the past 240 hour(s))  Resp Panel by RT-PCR (Flu A&B, Covid) Nasopharyngeal Swab     Status: None   Collection Time: 05/01/20  6:23 PM   Specimen: Nasopharyngeal Swab; Nasopharyngeal(NP) swabs in vial transport medium  Result Value Ref Range Status   SARS Coronavirus 2 by RT PCR NEGATIVE NEGATIVE Final    Comment: (NOTE) SARS-CoV-2 target nucleic acids are NOT DETECTED.  The SARS-CoV-2 RNA is generally detectable in upper respiratory specimens during the acute phase of infection. The lowest concentration of SARS-CoV-2 viral copies this assay can detect is 138 copies/mL. A negative result does not preclude SARS-Cov-2 infection and should not be used as the sole basis for treatment or other patient management decisions. A negative result may occur with  improper specimen collection/handling, submission of  specimen other than nasopharyngeal swab, presence of viral mutation(s) within the areas targeted by this assay, and inadequate number of viral copies(<138 copies/mL). A negative result must be combined with clinical observations, patient history, and epidemiological information. The expected result is Negative.  Fact Sheet for Patients:  BloggerCourse.com  Fact Sheet for Healthcare Providers:  SeriousBroker.it  This test is no t yet approved or cleared by the [place] FDA and  has been authorized for detection and/or diagnosis of SARS-CoV-2 by FDA under an Emergency Use Authorization (EUA). This EUA will remain  in effect (meaning this test can be used) for the duration of the COVID-19 declaration under Section 564(b)(1) of the Act, 21 U.S.C.section 360bbb-3(b)(1), unless the authorization is terminated  or revoked sooner.       Influenza A by PCR NEGATIVE NEGATIVE Final   Influenza B by PCR NEGATIVE NEGATIVE Final    Comment: (NOTE) The Xpert Xpress SARS-CoV-2/FLU/RSV plus assay is intended as an aid in the diagnosis of influenza from Nasopharyngeal swab specimens and should not be used as a sole basis for treatment. Nasal washings and aspirates are unacceptable for Xpert Xpress SARS-CoV-2/FLU/RSV testing.  Fact Sheet for Patients: BloggerCourse.com  Fact Sheet for Healthcare Providers: SeriousBroker.it  This test is not yet approved or cleared by the [place] FDA and has been authorized for detection and/or diagnosis of SARS-CoV-2 by FDA under an Emergency Use Authorization (EUA). This EUA will remain in effect (meaning this test can be used) for the duration of the COVID-19 declaration under Section 564(b)(1) of the Act, 21 U.S.C. section 360bbb-3(b)(1), unless the authorization is terminated or revoked.  Performed at [facility], [address], [place], [place] [address]          Radiology Studies: DG Ribs Unilateral W/Chest Right  Result Date: 05/01/2020 CLINICAL DATA:  Rib and back pain, recent T11 fracture EXAM: RIGHT RIBS AND CHEST - 3+ VIEW COMPARISON:  11/26/2017 FINDINGS: No displaced fracture or other bone lesions are seen involving the ribs. There is no evidence of pneumothorax or pleural effusion. Cardiomegaly. No acute appearing airspace opacity. Surgical clips and suture project over the right lung base. IMPRESSION: 1. No displaced rib fracture or other acute abnormality of the right ribs. No pneumothorax or pleural effusion. 2. The thoracic spine is not well imaged on frontal and oblique views provided per report of recent T11 fracture. 3. Cardiomegaly.  4. No acute appearing airspace opacity. Electronically Signed   By: Eddie Candle M.D.   On: 05/01/2020 14:54   MR THORACIC SPINE WO CONTRAST  Result Date: 05/01/2020 CLINICAL DATA:  86 year old female with recent T11 fracture. Rib and back pain. EXAM: MRI THORACIC AND LUMBAR SPINE WITHOUT CONTRAST TECHNIQUE: Multiplanar and multiecho pulse sequences of the thoracic and lumbar spine were obtained without intravenous contrast. COMPARISON:  Rib radiographs earlier 03/27/2020. Today. CT Abdomen and Pelvis FINDINGS: MRI THORACIC SPINE FINDINGS Limited cervical spine imaging: Previous cervical ACDF, appears to be at the C4-C5 levels. Thoracic spine segmentation: Appears to be normal aside from hypoplastic ribs at T12, as suggested on CT Abdomen and Pelvis last month. Alignment: Normal thoracic vertebral height and alignment above the T11 level, see below. Vertebrae: Thoracic vertebrae T1 through T8 are intact with normal marrow signal. T9 vertebral body appears intact but there is an acute to subacute fracture of the posterior left 9th rib with marrow edema (series 17, image 17 and series 21, image 30). T10 vertebra appears intact but there is a fracture of the posterior  left 10th rib (same image on series 17 and series 21, image 33). Progress T11 compression fracture since last month with comminution, confluent marrow edema, vertebral loss of height now up to 40% (previously less than 20%). New retropulsion of bone (series 17, image 10) with mild spinal stenosis (series 21, image 37). Mild associated spinal cord mass effect (series 20, image 37 and series 29, image 2). No spinal cord edema or myelomalacia suspected. Moderate to severe left and moderate right T11 foraminal stenosis also is increased. Superimposed left posterior levin through fracture suspected. The T12 vertebra appears intact with superimposed benign vertebral body hemangioma. Cord: Capacious thoracic spinal canal above T11. See T11 spinal stenosis and cord mass effect details above. No definite cord signal abnormality. Conus medullaris is at T12-L1. Paraspinal and other soft tissues: Left paraspinal soft tissue edema from the 9th rib through T12 vertebral level. No drainable paraspinal fluid collection. Trace bilateral layering pleural effusions. Small round right lung base fat density nodule appears stable from last month. Disc levels: No age advanced degenerative changes superimposed on the acute osseous abnormalities detailed above. MRI LUMBAR SPINE FINDINGS Segmentation:  Normal. Alignment: Moderate levoconvex lumbar scoliosis appears stable from last month. Stable lumbar vertebral height and alignment. Vertebrae: No marrow edema or evidence of acute osseous abnormality in the lumbar spine. Intact visible sacrum and SI joints. Conus medullaris and cauda equina: Conus extends to the T12-L1 level. Conus and cauda equina appear normal. Paraspinal and other soft tissues: Left posterolateral lower thoracic paraspinal soft tissue edema but no acute lumbar paraspinal soft tissue finding. Chronic lumbar paraspinal muscle atrophy. Visible abdominal viscera appear stable since last month. Disc levels: Advanced lumbar  spine degeneration in the setting of levoconvex scoliosis. Up to mild associated lumbar spinal stenosis. IMPRESSION: MR THORACIC SPINE IMPRESSION 1. Progressed T11 compression fracture since last month. Increased comminution. Loss of height increased from 20% to 40%. And new retropulsion of bone resulting in mild spinal stenosis with mild cord mass effect but no spinal cord signal abnormality. 2. Associated acute to subacute posterior left rib fractures 9 through 11. 3. Left paraspinal soft tissue edema from T9 to T12. 4. Trace layering pleural effusions. MR LUMBAR SPINE IMPRESSION 1. No acute osseous abnormality in the lumbar spine. 2. Advanced lumbar spine degeneration the setting of moderate levoconvex lumbar scoliosis. Electronically Signed   By: Herminio Heads.D.  On: 05/01/2020 17:50   MR LUMBAR SPINE WO CONTRAST  Result Date: 05/01/2020 CLINICAL DATA:  86 year old female with recent T11 fracture. Rib and back pain. EXAM: MRI THORACIC AND LUMBAR SPINE WITHOUT CONTRAST TECHNIQUE: Multiplanar and multiecho pulse sequences of the thoracic and lumbar spine were obtained without intravenous contrast. COMPARISON:  Rib radiographs earlier 03/27/2020. Today. CT Abdomen and Pelvis FINDINGS: MRI THORACIC SPINE FINDINGS Limited cervical spine imaging: Previous cervical ACDF, appears to be at the C4-C5 levels. Thoracic spine segmentation: Appears to be normal aside from hypoplastic ribs at T12, as suggested on CT Abdomen and Pelvis last month. Alignment: Normal thoracic vertebral height and alignment above the T11 level, see below. Vertebrae: Thoracic vertebrae T1 through T8 are intact with normal marrow signal. T9 vertebral body appears intact but there is an acute to subacute fracture of the posterior left 9th rib with marrow edema (series 17, image 17 and series 21, image 30). T10 vertebra appears intact but there is a fracture of the posterior left 10th rib (same image on series 17 and series 21, image 33). Progress  T11 compression fracture since last month with comminution, confluent marrow edema, vertebral loss of height now up to 40% (previously less than 20%). New retropulsion of bone (series 17, image 10) with mild spinal stenosis (series 21, image 37). Mild associated spinal cord mass effect (series 20, image 37 and series 29, image 2). No spinal cord edema or myelomalacia suspected. Moderate to severe left and moderate right T11 foraminal stenosis also is increased. Superimposed left posterior levin through fracture suspected. The T12 vertebra appears intact with superimposed benign vertebral body hemangioma. Cord: Capacious thoracic spinal canal above T11. See T11 spinal stenosis and cord mass effect details above. No definite cord signal abnormality. Conus medullaris is at T12-L1. Paraspinal and other soft tissues: Left paraspinal soft tissue edema from the 9th rib through T12 vertebral level. No drainable paraspinal fluid collection. Trace bilateral layering pleural effusions. Small round right lung base fat density nodule appears stable from last month. Disc levels: No age advanced degenerative changes superimposed on the acute osseous abnormalities detailed above. MRI LUMBAR SPINE FINDINGS Segmentation:  Normal. Alignment: Moderate levoconvex lumbar scoliosis appears stable from last month. Stable lumbar vertebral height and alignment. Vertebrae: No marrow edema or evidence of acute osseous abnormality in the lumbar spine. Intact visible sacrum and SI joints. Conus medullaris and cauda equina: Conus extends to the T12-L1 level. Conus and cauda equina appear normal. Paraspinal and other soft tissues: Left posterolateral lower thoracic paraspinal soft tissue edema but no acute lumbar paraspinal soft tissue finding. Chronic lumbar paraspinal muscle atrophy. Visible abdominal viscera appear stable since last month. Disc levels: Advanced lumbar spine degeneration in the setting of levoconvex scoliosis. Up to mild  associated lumbar spinal stenosis. IMPRESSION: MR THORACIC SPINE IMPRESSION 1. Progressed T11 compression fracture since last month. Increased comminution. Loss of height increased from 20% to 40%. And new retropulsion of bone resulting in mild spinal stenosis with mild cord mass effect but no spinal cord signal abnormality. 2. Associated acute to subacute posterior left rib fractures 9 through 11. 3. Left paraspinal soft tissue edema from T9 to T12. 4. Trace layering pleural effusions. MR LUMBAR SPINE IMPRESSION 1. No acute osseous abnormality in the lumbar spine. 2. Advanced lumbar spine degeneration the setting of moderate levoconvex lumbar scoliosis. Electronically Signed   By: Genevie Ann M.D.   On: 05/01/2020 17:50        Scheduled Meds: . dorzolamide  1 drop  Both Eyes BID  . feeding supplement  237 mL Oral BID BM  . hydrochlorothiazide  12.5 mg Oral Daily  . latanoprost  1 drop Both Eyes QHS  . losartan  50 mg Oral Daily  . senna-docusate  1 tablet Oral BID   Continuous Infusions: . sodium chloride Stopped (05/02/20 2103)  . cefTRIAXone (ROCEPHIN)  IV 1 g (05/02/20 2029)     LOS: 2 days    Time spent: 25 mins.More than 50% of that time was spent in counseling and/or coordination of care.      Shelly Coss, MD [facility] P12/27/2021, 1:58 PM

## 2020-05-03 NOTE — Consult Note (Signed)
Reason for Consult:T11 compression fracture Referring Physician: ED  Danielle Thomas is an 86 y.o. female.  HPI: who has been dealing with severe pain in the lumbar region since being diagnosed with a T11 compression fracture during an admission to the hospital on November 20. She subsequently went to rehabilitation for approximately two weeks then returned home. The prescribed thoracolumbar brace was not worn with any regularity once discharged. The pain increased during this time in her back. Danielle Thomas did see the PA for Dr. Lynann Bologna for this problem during her time at home. She was told an MRI thoracic spine was needed before a definitive plan could be made. The appointment for the MRI was set for sometime this week, and a return visit with Dr. Lynann Bologna. Unfortunately her pain became so severe that she was brought back to the ED on 05/01/20 for evaluation and dehydration. The family was told by her [facility] nurse that she needed to be admitted that night. I was contacted by the ED for her compression fracture and its deterioration with further loss of height, and new bony retropulsion into the spinal canal. I was not informed that she already had established a medical relationship with Dr. Lynann Bologna whom I do not believe was ever notified. I advised the ED physician that while certainly problematic for the patient that there was no surgical emergency. Danielle Thomas maintained good neurological function with intact bowel and bladder function. My partner was notified on Sunday and was made NPO on Sunday for an expected procedure on Monday. I, nor any other neurosurgeon, had planned on a procedure being performed on Monday the 27th. Our office received another call from her nurse this morning regarding a possible procedure today since the patient was npo.  DanielleThomas states she is in significant pain and that there is little if any relief from pain medications. Danielle Thomas INR remains elevated, and no active  measures have been initiated to bring the INR into the normal range.   Past Medical History:  Diagnosis Date  . Hypertension    Atrial fibrillation CVA Past Surgical History:  Procedure Laterality Date  . KNEE SURGERY    . LUNG BIOPSY    . PARTIAL HYSTERECTOMY    . TONSILLECTOMY AND ADENOIDECTOMY      Family History  Problem Relation Age of Onset  . Other Other        NO HEALTH ISSUES    Social History:  reports that she has never smoked. She has never used smokeless tobacco. No history on file for alcohol use and drug use.  Allergies:  Allergies  Allergen Reactions  . Amlodipine Besylate     Other reaction(s): high dose make feet swell  . Codeine Nausea And Vomiting  . Meperidine Nausea Only  . Metoprolol Succinate [Metoprolol]     Other reaction(s): effects her breathing  . Oxycodone Nausea And Vomiting    Medications: I have reviewed the patient's current medications.  Results for orders placed or performed during the hospital encounter of 05/01/20 (from the past 48 hour(s))  Protime-INR     Status: Abnormal   Collection Time: 05/01/20  3:42 PM  Result Value Ref Range   Prothrombin Time 28.7 (H) 11.4 - 15.2 seconds   INR 2.8 (H) 0.8 - 1.2    Comment: (NOTE) INR goal varies based on device and disease states. Performed at [facility], [place] [address], [place], [place] [address]   Resp Panel by RT-PCR (Flu A&B, Covid) Nasopharyngeal  Swab     Status: None   Collection Time: 05/01/20  6:23 PM   Specimen: Nasopharyngeal Swab; Nasopharyngeal(NP) swabs in vial transport medium  Result Value Ref Range   SARS Coronavirus 2 by RT PCR NEGATIVE NEGATIVE    Comment: (NOTE) SARS-CoV-2 target nucleic acids are NOT DETECTED.  The SARS-CoV-2 RNA is generally detectable in upper respiratory specimens during the acute phase of infection. The lowest concentration of SARS-CoV-2 viral copies this assay can detect is 138 copies/mL. A negative result does not  preclude SARS-Cov-2 infection and should not be used as the sole basis for treatment or other patient management decisions. A negative result may occur with  improper specimen collection/handling, submission of specimen other than nasopharyngeal swab, presence of viral mutation(s) within the areas targeted by this assay, and inadequate number of viral copies(<138 copies/mL). A negative result must be combined with clinical observations, patient history, and epidemiological information. The expected result is Negative.  Fact Sheet for Patients:  BloggerCourse.com  Fact Sheet for Healthcare Providers:  SeriousBroker.it  This test is no t yet approved or cleared by the [place] FDA and  has been authorized for detection and/or diagnosis of SARS-CoV-2 by FDA under an Emergency Use Authorization (EUA). This EUA will remain  in effect (meaning this test can be used) for the duration of the COVID-19 declaration under Section 564(b)(1) of the Act, 21 U.S.C.section 360bbb-3(b)(1), unless the authorization is terminated  or revoked sooner.       Influenza A by PCR NEGATIVE NEGATIVE   Influenza B by PCR NEGATIVE NEGATIVE    Comment: (NOTE) The Xpert Xpress SARS-CoV-2/FLU/RSV plus assay is intended as an aid in the diagnosis of influenza from Nasopharyngeal swab specimens and should not be used as a sole basis for treatment. Nasal washings and aspirates are unacceptable for Xpert Xpress SARS-CoV-2/FLU/RSV testing.  Fact Sheet for Patients: BloggerCourse.com  Fact Sheet for Healthcare Providers: SeriousBroker.it  This test is not yet approved or cleared by the [place] FDA and has been authorized for detection and/or diagnosis of SARS-CoV-2 by FDA under an Emergency Use Authorization (EUA). This EUA will remain in effect (meaning this test can be used) for the duration of  the COVID-19 declaration under Section 564(b)(1) of the Act, 21 U.S.C. section 360bbb-3(b)(1), unless the authorization is terminated or revoked.  Performed at [facility], [address], [place], [place] [address]   Magnesium     Status: None   Collection Time: 05/01/20  9:31 PM  Result Value Ref Range   Magnesium 1.9 1.7 - 2.4 mg/dL    Comment: Performed at [facility], [address], [place], [place] [address]  CBC     Status: Abnormal   Collection Time: 05/02/20  2:36 AM  Result Value Ref Range   WBC 12.0 (H) 4.0 - 10.5 K/uL   RBC 5.15 (H) 3.87 - 5.11 MIL/uL   Hemoglobin 15.4 (H) 12.0 - 15.0 g/dL   HCT 36.0 (H) 67.7 - 03.4 %   MCV 91.5 80.0 - 100.0 fL   MCH 29.9 26.0 - 34.0 pg   MCHC 32.7 30.0 - 36.0 g/dL   RDW 03.5 24.8 - 18.5 %   Platelets 222 150 - 400 K/uL   nRBC 0.0 0.0 - 0.2 %    Comment: Performed at [facility], [address], [place], [place] [address]  Basic metabolic panel     Status: Abnormal   Collection Time: 05/02/20  2:36 AM  Result Value Ref Range   Sodium 135 135 - 145 mmol/L   Potassium 4.5 3.5 - 5.1 mmol/L    Comment: DELTA CHECK NOTED   Chloride 96 (L) 98 - 111 mmol/L   CO2 29 22 - 32 mmol/L   Glucose, Bld 100 (H) 70 - 99 mg/dL    Comment: Glucose reference range applies only to samples taken after fasting for at least 8 hours.   BUN 23 8 - 23 mg/dL   Creatinine, Ser 0.94 0.44 - 1.00 mg/dL   Calcium 8.7 (L) 8.9 - 10.3 mg/dL   GFR, Estimated 58 (L) >60 mL/min    Comment: (NOTE) Calculated using the CKD-EPI Creatinine Equation (2021)    Anion gap 10 5 - 15    Comment: Performed at [facility], [place] [address] [place], [place] [address]  Protime-INR     Status: Abnormal   Collection Time: 05/02/20  2:36 AM  Result Value Ref Range   Prothrombin Time 30.5 (H) 11.4 - 15.2 seconds   INR 3.0 (H) 0.8 - 1.2    Comment: (NOTE) INR goal varies based on device and disease  states. Performed at [facility], [place] [address] [place], [place] [address]   Urinalysis, Routine w reflex microscopic Urine, Catheterized     Status: Abnormal   Collection Time: 05/02/20  2:42 PM  Result Value Ref Range   Color, Urine AMBER (A) YELLOW    Comment: BIOCHEMICALS MAY BE AFFECTED BY COLOR   APPearance TURBID (A) CLEAR   Specific Gravity, Urine 1.023 1.005 - 1.030   pH 5.0 5.0 - 8.0   Glucose, UA NEGATIVE NEGATIVE mg/dL   Hgb urine dipstick LARGE (A) NEGATIVE   Bilirubin Urine NEGATIVE NEGATIVE   Ketones, ur NEGATIVE NEGATIVE mg/dL   Protein, ur 100 (A) NEGATIVE mg/dL   Nitrite NEGATIVE NEGATIVE   Leukocytes,Ua MODERATE (A) NEGATIVE   RBC / HPF >50 (H) 0 - 5 RBC/hpf   WBC, UA >50 (H) 0 - 5 WBC/hpf   Bacteria, UA MANY (A) NONE SEEN   Squamous Epithelial / LPF 0-5 0 - 5   WBC Clumps PRESENT    Non Squamous Epithelial 0-5 (A) NONE SEEN   Crystals PRESENT (A) NEGATIVE    Comment: Performed at [facility], [place] [address], [place], [place] [address]  Protime-INR     Status: Abnormal   Collection Time: 05/03/20  2:01 AM  Result Value Ref Range   Prothrombin Time 37.5 (H) 11.4 - 15.2 seconds   INR 4.0 (H) 0.8 - 1.2    Comment: (NOTE) INR goal varies based on device and disease states. Performed at [facility], [place] [address] [place], [place] [address]   CBC with Differential/Platelet     Status: Abnormal   Collection Time: 05/03/20  2:01 AM  Result Value Ref Range   WBC 10.8 (H) 4.0 - 10.5 K/uL   RBC 5.34 (H) 3.87 - 5.11 MIL/uL   Hemoglobin 16.2 (H) 12.0 - 15.0 g/dL   HCT 49.3 (H) 36.0 - 46.0 %   MCV 92.3 80.0 - 100.0 fL   MCH 30.3 26.0 - 34.0 pg   MCHC 32.9 30.0 - 36.0 g/dL   RDW 15.6 (H) 11.5 - 15.5 %   Platelets 231 150 - 400 K/uL   nRBC 0.0 0.0 - 0.2 %   Neutrophils Relative % 76 %   Neutro Abs 8.3 (H) 1.7 - 7.7 K/uL   Lymphocytes Relative 9 %  Lymphs Abs 1.0 0.7 - 4.0 K/uL   Monocytes  Relative 10 %   Monocytes Absolute 1.1 (H) 0.1 - 1.0 K/uL   Eosinophils Relative 3 %   Eosinophils Absolute 0.3 0.0 - 0.5 K/uL   Basophils Relative 1 %   Basophils Absolute 0.1 0.0 - 0.1 K/uL   Immature Granulocytes 1 %   Abs Immature Granulocytes 0.06 0.00 - 0.07 K/uL    Comment: Performed at [facility], [place] [address] [place], [place] [id number]  Basic metabolic panel     Status: Abnormal   Collection Time: 05/03/20  2:01 AM  Result Value Ref Range   Sodium 134 (L) 135 - 145 mmol/L   Potassium 3.9 3.5 - 5.1 mmol/L   Chloride 94 (L) 98 - 111 mmol/L   CO2 30 22 - 32 mmol/L   Glucose, Bld 105 (H) 70 - 99 mg/dL    Comment: Glucose reference range applies only to samples taken after fasting for at least 8 hours.   BUN 28 (H) 8 - 23 mg/dL   Creatinine, Ser 0.93 0.44 - 1.00 mg/dL   Calcium 8.8 (L) 8.9 - 10.3 mg/dL   GFR, Estimated 58 (L) >60 mL/min    Comment: (NOTE) Calculated using the CKD-EPI Creatinine Equation (2021)    Anion gap 10 5 - 15    Comment: Performed at [facility], [place] [address], [place],  [address]    MR THORACIC SPINE WO CONTRAST  Result Date: 05/01/2020 CLINICAL DATA:  86 year old female with recent T11 fracture. Rib and back pain. EXAM: MRI THORACIC AND LUMBAR SPINE WITHOUT CONTRAST TECHNIQUE: Multiplanar and multiecho pulse sequences of the thoracic and lumbar spine were obtained without intravenous contrast. COMPARISON:  Rib radiographs earlier 03/27/2020. Today. CT Abdomen and Pelvis FINDINGS: MRI THORACIC SPINE FINDINGS Limited cervical spine imaging: Previous cervical ACDF, appears to be at the C4-C5 levels. Thoracic spine segmentation: Appears to be normal aside from hypoplastic ribs at T12, as suggested on CT Abdomen and Pelvis last month. Alignment: Normal thoracic vertebral height and alignment above the T11 level, see below. Vertebrae: Thoracic vertebrae T1 through T8 are intact with normal marrow signal. T9  vertebral body appears intact but there is an acute to subacute fracture of the posterior left 9th rib with marrow edema (series 17, image 17 and series 21, image 30). T10 vertebra appears intact but there is a fracture of the posterior left 10th rib (same image on series 17 and series 21, image 33). Progress T11 compression fracture since last month with comminution, confluent marrow edema, vertebral loss of height now up to 40% (previously less than 20%). New retropulsion of bone (series 17, image 10) with mild spinal stenosis (series 21, image 37). Mild associated spinal cord mass effect (series 20, image 37 and series 29, image 2). No spinal cord edema or myelomalacia suspected. Moderate to severe left and moderate right T11 foraminal stenosis also is increased. Superimposed left posterior levin through fracture suspected. The T12 vertebra appears intact with superimposed benign vertebral body hemangioma. Cord: Capacious thoracic spinal canal above T11. See T11 spinal stenosis and cord mass effect details above. No definite cord signal abnormality. Conus medullaris is at T12-L1. Paraspinal and other soft tissues: Left paraspinal soft tissue edema from the 9th rib through T12 vertebral level. No drainable paraspinal fluid collection. Trace bilateral layering pleural effusions. Small round right lung base fat density nodule appears stable from last month. Disc levels: No age advanced degenerative changes superimposed on  the acute osseous abnormalities detailed above. MRI LUMBAR SPINE FINDINGS Segmentation:  Normal. Alignment: Moderate levoconvex lumbar scoliosis appears stable from last month. Stable lumbar vertebral height and alignment. Vertebrae: No marrow edema or evidence of acute osseous abnormality in the lumbar spine. Intact visible sacrum and SI joints. Conus medullaris and cauda equina: Conus extends to the T12-L1 level. Conus and cauda equina appear normal. Paraspinal and other soft tissues: Left  posterolateral lower thoracic paraspinal soft tissue edema but no acute lumbar paraspinal soft tissue finding. Chronic lumbar paraspinal muscle atrophy. Visible abdominal viscera appear stable since last month. Disc levels: Advanced lumbar spine degeneration in the setting of levoconvex scoliosis. Up to mild associated lumbar spinal stenosis. IMPRESSION: MR THORACIC SPINE IMPRESSION 1. Progressed T11 compression fracture since last month. Increased comminution. Loss of height increased from 20% to 40%. And new retropulsion of bone resulting in mild spinal stenosis with mild cord mass effect but no spinal cord signal abnormality. 2. Associated acute to subacute posterior left rib fractures 9 through 11. 3. Left paraspinal soft tissue edema from T9 to T12. 4. Trace layering pleural effusions. MR LUMBAR SPINE IMPRESSION 1. No acute osseous abnormality in the lumbar spine. 2. Advanced lumbar spine degeneration the setting of moderate levoconvex lumbar scoliosis. Electronically Signed   By: Genevie Ann M.D.   On: 05/01/2020 17:50   MR LUMBAR SPINE WO CONTRAST  Result Date: 05/01/2020 CLINICAL DATA:  86 year old female with recent T11 fracture. Rib and back pain. EXAM: MRI THORACIC AND LUMBAR SPINE WITHOUT CONTRAST TECHNIQUE: Multiplanar and multiecho pulse sequences of the thoracic and lumbar spine were obtained without intravenous contrast. COMPARISON:  Rib radiographs earlier 03/27/2020. Today. CT Abdomen and Pelvis FINDINGS: MRI THORACIC SPINE FINDINGS Limited cervical spine imaging: Previous cervical ACDF, appears to be at the C4-C5 levels. Thoracic spine segmentation: Appears to be normal aside from hypoplastic ribs at T12, as suggested on CT Abdomen and Pelvis last month. Alignment: Normal thoracic vertebral height and alignment above the T11 level, see below. Vertebrae: Thoracic vertebrae T1 through T8 are intact with normal marrow signal. T9 vertebral body appears intact but there is an acute to subacute  fracture of the posterior left 9th rib with marrow edema (series 17, image 17 and series 21, image 30). T10 vertebra appears intact but there is a fracture of the posterior left 10th rib (same image on series 17 and series 21, image 33). Progress T11 compression fracture since last month with comminution, confluent marrow edema, vertebral loss of height now up to 40% (previously less than 20%). New retropulsion of bone (series 17, image 10) with mild spinal stenosis (series 21, image 37). Mild associated spinal cord mass effect (series 20, image 37 and series 29, image 2). No spinal cord edema or myelomalacia suspected. Moderate to severe left and moderate right T11 foraminal stenosis also is increased. Superimposed left posterior levin through fracture suspected. The T12 vertebra appears intact with superimposed benign vertebral body hemangioma. Cord: Capacious thoracic spinal canal above T11. See T11 spinal stenosis and cord mass effect details above. No definite cord signal abnormality. Conus medullaris is at T12-L1. Paraspinal and other soft tissues: Left paraspinal soft tissue edema from the 9th rib through T12 vertebral level. No drainable paraspinal fluid collection. Trace bilateral layering pleural effusions. Small round right lung base fat density nodule appears stable from last month. Disc levels: No age advanced degenerative changes superimposed on the acute osseous abnormalities detailed above. MRI LUMBAR SPINE FINDINGS Segmentation:  Normal. Alignment: Moderate levoconvex lumbar  scoliosis appears stable from last month. Stable lumbar vertebral height and alignment. Vertebrae: No marrow edema or evidence of acute osseous abnormality in the lumbar spine. Intact visible sacrum and SI joints. Conus medullaris and cauda equina: Conus extends to the T12-L1 level. Conus and cauda equina appear normal. Paraspinal and other soft tissues: Left posterolateral lower thoracic paraspinal soft tissue edema but no  acute lumbar paraspinal soft tissue finding. Chronic lumbar paraspinal muscle atrophy. Visible abdominal viscera appear stable since last month. Disc levels: Advanced lumbar spine degeneration in the setting of levoconvex scoliosis. Up to mild associated lumbar spinal stenosis. IMPRESSION: MR THORACIC SPINE IMPRESSION 1. Progressed T11 compression fracture since last month. Increased comminution. Loss of height increased from 20% to 40%. And new retropulsion of bone resulting in mild spinal stenosis with mild cord mass effect but no spinal cord signal abnormality. 2. Associated acute to subacute posterior left rib fractures 9 through 11. 3. Left paraspinal soft tissue edema from T9 to T12. 4. Trace layering pleural effusions. MR LUMBAR SPINE IMPRESSION 1. No acute osseous abnormality in the lumbar spine. 2. Advanced lumbar spine degeneration the setting of moderate levoconvex lumbar scoliosis. Electronically Signed   By: Genevie Ann M.D.   On: 05/01/2020 17:50    Review of Systems  Constitutional:       Increased pain  HENT: Negative.   Eyes: Negative.   Respiratory: Negative.   Cardiovascular: Negative.        Atrial fibrillation  Gastrointestinal: Negative.   Genitourinary: Negative.   Musculoskeletal: Positive for back pain.  Skin: Negative.   Neurological: Negative.   Hematological: Bruises/bleeds easily.       Taking coumadin  Psychiatric/Behavioral: Negative.    Blood pressure (!) 142/82, pulse 92, temperature 97.6 F (36.4 C), temperature source Oral, resp. rate 17, height 5\' 3"  (1.6 m), weight 69.5 kg, SpO2 95 %. Physical Exam Constitutional:      General: She is in acute distress.     Appearance: Normal appearance.  HENT:     Head: Normocephalic and atraumatic.     Right Ear: Tympanic membrane normal.     Left Ear: Tympanic membrane normal.     Nose: Nose normal.     Mouth/Throat:     Mouth: Mucous membranes are moist.     Pharynx: Oropharynx is clear.  Eyes:     Extraocular  Movements: Extraocular movements intact.     Conjunctiva/sclera: Conjunctivae normal.     Pupils: Pupils are equal, round, and reactive to light.  Cardiovascular:     Pulses: Normal pulses.     Heart sounds: Normal heart sounds.  Pulmonary:     Effort: Pulmonary effort is normal.  Abdominal:     General: Abdomen is flat. Bowel sounds are normal.  Musculoskeletal:        General: Normal range of motion.     Cervical back: Normal range of motion.  Skin:    General: Skin is warm and dry.  Neurological:     General: No focal deficit present.     Mental Status: She is alert and oriented to person, place, and time.     Cranial Nerves: Cranial nerve deficit present.     Sensory: Sensation is intact.     Motor: Motor function is intact.     Coordination: Coordination normal. Finger-Nose-Finger Test normal.     Deep Tendon Reflexes: Babinski sign absent on the right side. Babinski sign absent on the left side.     Comments: Decreased  hearing to voice Gait not assessed     Assessment/Plan: TILA MILLIRONS is a 86 y.o. female With a worsening compression fracture at T11, associated with increasing back pain. Due to the retropulsion I do not believe a kyphoplasty is indicated. I will plan on percutaneous pedicle screws and a T10 to T12 arthrodesis. I do not plan on decompressing the spinal canal as she maintains a normal exam at this time.   Ashok Pall 05/03/2020, 3:39 PM

## 2020-05-03 NOTE — Progress Notes (Signed)
Patient transferred to [facility] via [place]. All belongings w/ patient. Dtr Juliann Pulse called/notified. Medicated prior to d/c.

## 2020-05-04 ENCOUNTER — Inpatient Hospital Stay (HOSPITAL_COMMUNITY): Payer: Medicare HMO | Admitting: Certified Registered"

## 2020-05-04 ENCOUNTER — Other Ambulatory Visit: Payer: Medicare HMO

## 2020-05-04 ENCOUNTER — Inpatient Hospital Stay (HOSPITAL_COMMUNITY): Payer: Medicare HMO

## 2020-05-04 ENCOUNTER — Encounter (HOSPITAL_COMMUNITY)
Admission: EM | Disposition: A | Discharge status: Rehabilitation Facility | Payer: Self-pay | Source: Home / Self Care | Attending: Internal Medicine

## 2020-05-04 ENCOUNTER — Encounter (HOSPITAL_COMMUNITY): Payer: Self-pay | Admitting: Internal Medicine

## 2020-05-04 DIAGNOSIS — S22080A Wedge compression fracture of T11-T12 vertebra, initial encounter for closed fracture: Secondary | ICD-10-CM | POA: Diagnosis not present

## 2020-05-04 DIAGNOSIS — I10 Essential (primary) hypertension: Secondary | ICD-10-CM | POA: Diagnosis not present

## 2020-05-04 DIAGNOSIS — E876 Hypokalemia: Secondary | ICD-10-CM | POA: Diagnosis not present

## 2020-05-04 DIAGNOSIS — I480 Paroxysmal atrial fibrillation: Secondary | ICD-10-CM | POA: Diagnosis not present

## 2020-05-04 HISTORY — PX: LUMBAR PERCUTANEOUS PEDICLE SCREW 2 LEVEL: SHX5561

## 2020-05-04 LAB — COMPREHENSIVE METABOLIC PANEL
ALT: 35 U/L (ref 0–44)
AST: 28 U/L (ref 15–41)
Albumin: 3 g/dL — ABNORMAL LOW (ref 3.5–5.0)
Alkaline Phosphatase: 74 U/L (ref 38–126)
Anion gap: 11 (ref 5–15)
BUN: 23 mg/dL (ref 8–23)
CO2: 30 mmol/L (ref 22–32)
Calcium: 8.9 mg/dL (ref 8.9–10.3)
Chloride: 96 mmol/L — ABNORMAL LOW (ref 98–111)
Creatinine, Ser: 1.01 mg/dL — ABNORMAL HIGH (ref 0.44–1.00)
GFR, Estimated: 53 mL/min — ABNORMAL LOW (ref >60)
Glucose, Bld: 98 mg/dL (ref 70–99)
Potassium: 4.1 mmol/L (ref 3.5–5.1)
Sodium: 137 mmol/L (ref 135–145)
Total Bilirubin: 0.8 mg/dL (ref 0.3–1.2)
Total Protein: 5.8 g/dL — ABNORMAL LOW (ref 6.5–8.1)

## 2020-05-04 LAB — URINE CULTURE: Culture: >=100000 — AB

## 2020-05-04 LAB — CBC WITH DIFFERENTIAL/PLATELET
Abs Immature Granulocytes: 0.05 10*3/uL (ref 0.00–0.07)
Basophils Absolute: 0.1 10*3/uL (ref 0.0–0.1)
Basophils Relative: 1 %
Eosinophils Absolute: 0.3 10*3/uL (ref 0.0–0.5)
Eosinophils Relative: 3 %
HCT: 51.8 % — ABNORMAL HIGH (ref 36.0–46.0)
Hemoglobin: 16.6 g/dL — ABNORMAL HIGH (ref 12.0–15.0)
Immature Granulocytes: 1 %
Lymphocytes Relative: 12 %
Lymphs Abs: 1.2 10*3/uL (ref 0.7–4.0)
MCH: 29.6 pg (ref 26.0–34.0)
MCHC: 32 g/dL (ref 30.0–36.0)
MCV: 92.3 fL (ref 80.0–100.0)
Monocytes Absolute: 1 10*3/uL (ref 0.1–1.0)
Monocytes Relative: 10 %
Neutro Abs: 7.3 10*3/uL (ref 1.7–7.7)
Neutrophils Relative %: 73 %
Platelets: 198 10*3/uL (ref 150–400)
RBC: 5.61 MIL/uL — ABNORMAL HIGH (ref 3.87–5.11)
RDW: 15.8 % — ABNORMAL HIGH (ref 11.5–15.5)
WBC: 10 10*3/uL (ref 4.0–10.5)
nRBC: 0 % (ref 0.0–0.2)

## 2020-05-04 LAB — TYPE AND SCREEN
ABO/RH(D): A POS
Antibody Screen: NEGATIVE

## 2020-05-04 LAB — SURGICAL PCR SCREEN
MRSA, PCR: NEGATIVE
Staphylococcus aureus: POSITIVE — AB

## 2020-05-04 LAB — PROTIME-INR
INR: 2.1 — ABNORMAL HIGH (ref 0.8–1.2)
INR: 1.5 — ABNORMAL HIGH (ref 0.8–1.2)
Prothrombin Time: 22.8 seconds — ABNORMAL HIGH (ref 11.4–15.2)
Prothrombin Time: 17.5 seconds — ABNORMAL HIGH (ref 11.4–15.2)

## 2020-05-04 LAB — PHOSPHORUS: Phosphorus: 3.3 mg/dL (ref 2.5–4.6)

## 2020-05-04 LAB — MAGNESIUM: Magnesium: 1.9 mg/dL (ref 1.7–2.4)

## 2020-05-04 LAB — ABO/RH: ABO/RH(D): A POS

## 2020-05-04 SURGERY — LUMBAR PERCUTANEOUS PEDICLE SCREW 2 LEVEL
Anesthesia: General

## 2020-05-04 MED ORDER — ONDANSETRON HCL 4 MG/2ML IJ SOLN: 4.0000 mg | Freq: Four times a day (QID) | INTRAMUSCULAR | Status: DC | PRN

## 2020-05-04 MED ORDER — POTASSIUM CHLORIDE IN NACL 20-0.9 MEQ/L-% IV SOLN
INTRAVENOUS | Status: DC
  Filled 2020-05-04: qty 1000

## 2020-05-04 MED ORDER — ZOLPIDEM TARTRATE 5 MG PO TABS
5.0000 mg | ORAL_TABLET | Freq: Every evening | ORAL | Status: DC | PRN
  Administered 2020-05-07 – 2020-05-12 (×2): 5 mg via ORAL
  Filled 2020-05-04 (×2): qty 1

## 2020-05-04 MED ORDER — OXYCODONE HCL 5 MG/5ML PO SOLN
5.0000 mg | Freq: Once | ORAL | Status: DC | PRN
Start: 2020-05-04 — End: 2020-05-04

## 2020-05-04 MED ORDER — CYCLOBENZAPRINE HCL 10 MG PO TABS: 10.0000 mg | ORAL_TABLET | Freq: Three times a day (TID) | ORAL | Status: DC | PRN

## 2020-05-04 MED ORDER — HYDROCODONE-ACETAMINOPHEN 7.5-325 MG PO TABS: 2.0000 | ORAL_TABLET | ORAL | Status: DC | PRN

## 2020-05-04 MED ORDER — LIDOCAINE-EPINEPHRINE 0.5 %-1:200000 IJ SOLN
INTRAMUSCULAR | Status: AC
  Filled 2020-05-04: qty 1

## 2020-05-04 MED ORDER — LABETALOL HCL 5 MG/ML IV SOLN
INTRAVENOUS | Status: DC | PRN
  Administered 2020-05-04: 5 mg via INTRAVENOUS

## 2020-05-04 MED ORDER — CHLORHEXIDINE GLUCONATE 0.12 % MT SOLN
OROMUCOSAL | Status: AC
  Administered 2020-05-04: 09:00:00 15 mL via OROMUCOSAL
  Filled 2020-05-04: qty 15

## 2020-05-04 MED ORDER — SUGAMMADEX SODIUM 200 MG/2ML IV SOLN
INTRAVENOUS | Status: DC | PRN
  Administered 2020-05-04: 200 mg via INTRAVENOUS

## 2020-05-04 MED ORDER — OXYCODONE HCL ER 10 MG PO T12A
10.0000 mg | EXTENDED_RELEASE_TABLET | Freq: Two times a day (BID) | ORAL | Status: DC
  Administered 2020-05-04 – 2020-05-12 (×16): 10 mg via ORAL
  Filled 2020-05-04 (×16): qty 1

## 2020-05-04 MED ORDER — PROPOFOL 10 MG/ML IV BOLUS
INTRAVENOUS | Status: AC
  Filled 2020-05-04: qty 20

## 2020-05-04 MED ORDER — ONDANSETRON HCL 4 MG/2ML IJ SOLN: 4.0000 mg | Freq: Once | INTRAMUSCULAR | Status: DC | PRN

## 2020-05-04 MED ORDER — THROMBIN 20000 UNITS EX SOLR
CUTANEOUS | Status: AC
  Filled 2020-05-04: qty 20000

## 2020-05-04 MED ORDER — ROCURONIUM BROMIDE 10 MG/ML (PF) SYRINGE
PREFILLED_SYRINGE | INTRAVENOUS | Status: DC | PRN
  Administered 2020-05-04 (×2): 50 mg via INTRAVENOUS

## 2020-05-04 MED ORDER — AMISULPRIDE (ANTIEMETIC) 5 MG/2ML IV SOLN: 10.0000 mg | Freq: Once | INTRAVENOUS | Status: DC | PRN

## 2020-05-04 MED ORDER — CHLORHEXIDINE GLUCONATE 0.12 % MT SOLN
15.0000 mL | Freq: Once | OROMUCOSAL | Status: AC
  Filled 2020-05-04: qty 15

## 2020-05-04 MED ORDER — ACETAMINOPHEN 650 MG RE SUPP: 650.0000 mg | RECTAL | Status: DC | PRN

## 2020-05-04 MED ORDER — BUPIVACAINE HCL 0.5 % IJ SOLN
INTRAMUSCULAR | Status: DC | PRN
  Administered 2020-05-04: 30 mL

## 2020-05-04 MED ORDER — FENTANYL CITRATE (PF) 250 MCG/5ML IJ SOLN
INTRAMUSCULAR | Status: AC
  Filled 2020-05-04: qty 5

## 2020-05-04 MED ORDER — ONDANSETRON HCL 4 MG PO TABS: 4.0000 mg | ORAL_TABLET | Freq: Four times a day (QID) | ORAL | Status: DC | PRN

## 2020-05-04 MED ORDER — THROMBIN 20000 UNITS EX SOLR
CUTANEOUS | Status: DC | PRN
  Administered 2020-05-04: 09:00:00 20 mL via TOPICAL

## 2020-05-04 MED ORDER — MUPIROCIN 2 % EX OINT
1.0000 "application " | TOPICAL_OINTMENT | Freq: Two times a day (BID) | CUTANEOUS | Status: AC
  Administered 2020-05-04 – 2020-05-08 (×8): 1 via NASAL
  Filled 2020-05-04 (×4): qty 22

## 2020-05-04 MED ORDER — LACTATED RINGERS IV SOLN: INTRAVENOUS | Status: DC | PRN

## 2020-05-04 MED ORDER — BUPIVACAINE HCL (PF) 0.5 % IJ SOLN
INTRAMUSCULAR | Status: AC
  Filled 2020-05-04: qty 30

## 2020-05-04 MED ORDER — FENTANYL CITRATE (PF) 100 MCG/2ML IJ SOLN
25.0000 ug | INTRAMUSCULAR | Status: DC | PRN
  Administered 2020-05-04 (×4): 25 ug via INTRAVENOUS

## 2020-05-04 MED ORDER — FENTANYL CITRATE (PF) 100 MCG/2ML IJ SOLN
INTRAMUSCULAR | Status: AC
  Filled 2020-05-04: qty 2

## 2020-05-04 MED ORDER — PROPOFOL 10 MG/ML IV BOLUS
INTRAVENOUS | Status: DC | PRN
  Administered 2020-05-04: 100 mg via INTRAVENOUS

## 2020-05-04 MED ORDER — SODIUM CHLORIDE 0.9% FLUSH: 3.0000 mL | INTRAVENOUS | Status: DC | PRN

## 2020-05-04 MED ORDER — PHENOL 1.4 % MT LIQD: 1.0000 | OROMUCOSAL | Status: DC | PRN

## 2020-05-04 MED ORDER — PHENYLEPHRINE HCL-NACL 10-0.9 MG/250ML-% IV SOLN
INTRAVENOUS | Status: DC | PRN
  Administered 2020-05-04: 40 ug/min via INTRAVENOUS

## 2020-05-04 MED ORDER — OXYCODONE HCL 5 MG PO TABS: 5.0000 mg | ORAL_TABLET | Freq: Once | ORAL | Status: DC | PRN

## 2020-05-04 MED ORDER — CHLORHEXIDINE GLUCONATE CLOTH 2 % EX PADS
6.0000 | MEDICATED_PAD | Freq: Every day | CUTANEOUS | Status: AC
  Administered 2020-05-05 – 2020-05-07 (×3): 6 via TOPICAL

## 2020-05-04 MED ORDER — HYDRALAZINE HCL 20 MG/ML IJ SOLN
10.0000 mg | Freq: Four times a day (QID) | INTRAMUSCULAR | Status: DC | PRN
  Administered 2020-05-05: 03:00:00 10 mg via INTRAVENOUS
  Filled 2020-05-04: qty 1

## 2020-05-04 MED ORDER — HYDROCODONE-ACETAMINOPHEN 7.5-325 MG PO TABS: 1.0000 | ORAL_TABLET | ORAL | Status: DC | PRN

## 2020-05-04 MED ORDER — 0.9 % SODIUM CHLORIDE (POUR BTL) OPTIME
TOPICAL | Status: DC | PRN
  Administered 2020-05-04: 09:00:00 1000 mL

## 2020-05-04 MED ORDER — NYSTATIN 100000 UNIT/GM EX CREA
TOPICAL_CREAM | CUTANEOUS | Status: AC
  Administered 2020-05-04: 1 via TOPICAL
  Filled 2020-05-04: qty 15

## 2020-05-04 MED ORDER — CEFAZOLIN SODIUM-DEXTROSE 2-4 GM/100ML-% IV SOLN
2.0000 g | INTRAVENOUS | Status: AC
  Administered 2020-05-04: 10:00:00 2 g via INTRAVENOUS
  Filled 2020-05-04: qty 100

## 2020-05-04 MED ORDER — LIDOCAINE 2% (20 MG/ML) 5 ML SYRINGE
INTRAMUSCULAR | Status: DC | PRN
  Administered 2020-05-04: 60 mg via INTRAVENOUS

## 2020-05-04 MED ORDER — CHLORHEXIDINE GLUCONATE CLOTH 2 % EX PADS
6.0000 | MEDICATED_PAD | Freq: Once | CUTANEOUS | Status: AC
  Administered 2020-05-04: 06:00:00 6 via TOPICAL

## 2020-05-04 MED ORDER — SODIUM CHLORIDE 0.9% FLUSH
3.0000 mL | Freq: Two times a day (BID) | INTRAVENOUS | Status: DC
  Administered 2020-05-04 – 2020-05-13 (×16): 3 mL via INTRAVENOUS

## 2020-05-04 MED ORDER — MENTHOL 3 MG MT LOZG: 1.0000 | LOZENGE | OROMUCOSAL | Status: DC | PRN

## 2020-05-04 MED ORDER — DEXAMETHASONE SODIUM PHOSPHATE 10 MG/ML IJ SOLN
INTRAMUSCULAR | Status: DC | PRN
  Administered 2020-05-04: 10 mg via INTRAVENOUS

## 2020-05-04 MED ORDER — ONDANSETRON HCL 4 MG/2ML IJ SOLN
INTRAMUSCULAR | Status: DC | PRN
  Administered 2020-05-04: 4 mg via INTRAVENOUS

## 2020-05-04 MED ORDER — ACETAMINOPHEN 325 MG PO TABS
650.0000 mg | ORAL_TABLET | ORAL | Status: DC | PRN
  Administered 2020-05-12: 650 mg via ORAL
  Filled 2020-05-04: qty 2

## 2020-05-04 MED ORDER — LIDOCAINE-EPINEPHRINE 0.5 %-1:200000 IJ SOLN
INTRAMUSCULAR | Status: DC | PRN
  Administered 2020-05-04: 10 mL

## 2020-05-04 MED ORDER — PHENYLEPHRINE 40 MCG/ML (10ML) SYRINGE FOR IV PUSH (FOR BLOOD PRESSURE SUPPORT)
PREFILLED_SYRINGE | INTRAVENOUS | Status: DC | PRN
  Administered 2020-05-04 (×2): 80 ug via INTRAVENOUS

## 2020-05-04 MED ORDER — SODIUM CHLORIDE 0.9 % IV SOLN: 250.0000 mL | INTRAVENOUS | Status: DC

## 2020-05-04 MED ORDER — FENTANYL CITRATE (PF) 250 MCG/5ML IJ SOLN
INTRAMUSCULAR | Status: DC | PRN
  Administered 2020-05-04 (×3): 50 ug via INTRAVENOUS
  Administered 2020-05-04: 100 ug via INTRAVENOUS

## 2020-05-04 SURGICAL SUPPLY — 52 items
ADH SKN CLS APL DERMABOND .7 (GAUZE/BANDAGES/DRESSINGS) ×1
BUR MATCHSTICK NEURO 3.0 LAGG (BURR) IMPLANT
BUR PRECISION FLUTE 5.0 (BURR) IMPLANT
CANISTER SUCT 3000ML PPV (MISCELLANEOUS) ×1 IMPLANT
COVER BACK TABLE 60X90IN (DRAPES) ×2 IMPLANT
COVER WAND RF STERILE (DRAPES) ×1 IMPLANT
DECANTER SPIKE VIAL GLASS SM (MISCELLANEOUS) ×1 IMPLANT
DERMABOND ADVANCED (GAUZE/BANDAGES/DRESSINGS) ×1
DERMABOND ADVANCED .7 DNX12 (GAUZE/BANDAGES/DRESSINGS) IMPLANT
DRAPE C-ARM 42X72 X-RAY (DRAPES) ×2 IMPLANT
DRAPE C-ARMOR (DRAPES) ×1 IMPLANT
DRAPE LAPAROTOMY 100X72X124 (DRAPES) ×1 IMPLANT
DRAPE SURG 17X23 STRL (DRAPES) ×1 IMPLANT
DRSG OPSITE POSTOP 4X8 (GAUZE/BANDAGES/DRESSINGS) ×1 IMPLANT
DURAPREP 26ML APPLICATOR (WOUND CARE) ×1 IMPLANT
ELECT REM PT RETURN 9FT ADLT (ELECTROSURGICAL) ×2
ELECTRODE REM PT RTRN 9FT ADLT (ELECTROSURGICAL) IMPLANT
GAUZE SPONGE 4X4 12PLY STRL LF (GAUZE/BANDAGES/DRESSINGS) ×1 IMPLANT
GLOVE ECLIPSE 6.5 STRL STRAW (GLOVE) ×1 IMPLANT
GOWN STRL REUS W/ TWL LRG LVL3 (GOWN DISPOSABLE) IMPLANT
GOWN STRL REUS W/ TWL XL LVL3 (GOWN DISPOSABLE) IMPLANT
GOWN STRL REUS W/TWL LRG LVL3 (GOWN DISPOSABLE) ×4
GOWN STRL REUS W/TWL XL LVL3 (GOWN DISPOSABLE) ×2
GRAFT BN 5X1XSPNE CVD POST DBM (Bone Implant) IMPLANT
GRAFT BONE MAGNIFUSE 1X5CM (Bone Implant) ×2 IMPLANT
GUIDEWIRE NITINOL BEVEL TIP (WIRE) ×7 IMPLANT
KIT BASIN OR (CUSTOM PROCEDURE TRAY) ×2 IMPLANT
KIT INFUSE SMALL (Orthopedic Implant) ×1 IMPLANT
KIT POSITION SURG JACKSON T1 (MISCELLANEOUS) ×1 IMPLANT
NDL HYPO 25X1 1.5 SAFETY (NEEDLE) IMPLANT
NDL I-PASS III (NEEDLE) IMPLANT
NDL SPNL 18GX3.5 QUINCKE PK (NEEDLE) IMPLANT
NEEDLE HYPO 25X1 1.5 SAFETY (NEEDLE) ×2 IMPLANT
NEEDLE I-PASS III (NEEDLE) ×2 IMPLANT
NEEDLE SPNL 18GX3.5 QUINCKE PK (NEEDLE) ×2 IMPLANT
NS IRRIG 1000ML POUR BTL (IV SOLUTION) ×1 IMPLANT
PACK LAMINECTOMY NEURO (CUSTOM PROCEDURE TRAY) ×1 IMPLANT
ROD RELINE MAS LORD 5.5X110MM (Rod) ×2 IMPLANT
ROD RELINE MAS LORD 5.5X90MM (Rod) ×1 IMPLANT
SCREW LOCK RELINE 5.5 TULIP (Screw) ×7 IMPLANT
SCREW RELINE MAS POLY 5.5X40 (Screw) ×5 IMPLANT
SCREW RELINE POLY 4.5X40MM (Screw) ×4 IMPLANT
SCREW RLINE PLY 2C 40X4.5XPA (Screw) IMPLANT
SPONGE SURGIFOAM ABS GEL 100 (HEMOSTASIS) ×1 IMPLANT
SUT VIC AB 0 CT1 18XCR BRD8 (SUTURE) IMPLANT
SUT VIC AB 0 CT1 8-18 (SUTURE) ×2
SUT VIC AB 2-0 CT1 18 (SUTURE) ×2 IMPLANT
SUT VIC AB 3-0 SH 8-18 (SUTURE) ×3 IMPLANT
TOWEL GREEN STERILE (TOWEL DISPOSABLE) ×1 IMPLANT
TRAY FOLEY MTR SLVR 14FR STAT (SET/KITS/TRAYS/PACK) IMPLANT
TRAY FOLEY MTR SLVR 16FR STAT (SET/KITS/TRAYS/PACK) ×1 IMPLANT
WATER STERILE IRR 1000ML POUR (IV SOLUTION) ×2 IMPLANT

## 2020-05-04 NOTE — Op Note (Signed)
05/04/2020  1:59 PM  PATIENT:  Danielle Thomas  86 y.o. female with a T11 compression fracture which has lost height since initial diagnosis in November. Retropulsion excludes kyphoplasty possibility. Here for percutaneous pedicle screw placement, and arthrodesis  PRE-OPERATIVE DIAGNOSIS:  Thoracic compression fracture 11 POST-OPERATIVE DIAGNOSIS:  Thoracic compression fracture 11 PROCEDURE:  Procedure(s): thoracic 10-11 arthrodesis, allograft morsels, bmp Thoracic nine to Thoracic twelve percutaneous pedicle screws For segmental pedicle screw fixation SURGEON: Surgeon(s): Coletta Memos, MD Lisbeth Renshaw, MD  ASSISTANTS:Nundkumar, Marlane Hatcher  ANESTHESIA:   general  EBL:  Total I/O In: 1200 [I.V.:1200] Out: 160 [Urine:80; Blood:80]  BLOOD ADMINISTERED:none  CELL SAVER GIVEN:none  COUNT:per nursing  DRAINS: none   SPECIMEN:  No Specimen  DICTATION: [name] was taken to the operating room, intubated, and placed under a general anesthetic without difficulty. She was positioned prone on a jackson table with all pressure points properly padded. Her thoracolumbar region was prepped and draped in a sterile manner. With fluoroscopy I placed pedicle screws T9,10,11, and 12 on the left, and on the right side side T9,10, and T12 screws I connected the screws using rods and locking caps. I completed the arthrodesis via an exposure of the T11 and T10 lamina. I decorticated the bone with a curette. We placed bmp then allograft bone morsels over the bmp and lamina bilaterally. We closed the wounds with vicryl sutures. I used dermabond and an occlusive bandage for a dressing. She was rolled onto the stretcher and placed supine. The endotracheal tube was removed and she was transported to the PACU moving all extremities and breathing on her own.   PLAN OF CARE: Admit to inpatient   PATIENT DISPOSITION:  PACU - hemodynamically stable.   Delay start of Pharmacological VTE agent  (>24hrs) due to surgical blood loss or risk of bleeding:  no

## 2020-05-04 NOTE — Anesthesia Preprocedure Evaluation (Addendum)
Anesthesia Evaluation  Patient identified by MRN, date of birth, ID band Patient awake    Reviewed: Allergy & Precautions, NPO status , Patient's Chart, lab work & pertinent test results  History of Anesthesia Complications Negative for: history of anesthetic complications  Airway Mallampati: III  TM Distance: >3 FB Neck ROM: Full  Mouth opening: Limited Mouth Opening  Dental  (+) Dental Advisory Given   Pulmonary neg pulmonary ROS,    Pulmonary exam normal        Cardiovascular hypertension, + Peripheral Vascular Disease  Normal cardiovascular exam+ dysrhythmias Atrial Fibrillation      Neuro/Psych T11 compression fx negative psych ROS   GI/Hepatic negative GI ROS, Neg liver ROS,   Endo/Other  negative endocrine ROS  Renal/GU negative Renal ROS  negative genitourinary   Musculoskeletal negative musculoskeletal ROS (+)   Abdominal   Peds  Hematology Coumadin for A fib INR 4.0 on 12/27   Anesthesia Other Findings   Reproductive/Obstetrics                           Anesthesia Physical Anesthesia Plan  ASA: III  Anesthesia Plan: General   Post-op Pain Management:    Induction: Intravenous  PONV Risk Score and Plan: 3 and Ondansetron, Dexamethasone, Treatment may vary due to age or medical condition and Midazolam  Airway Management Planned: Oral ETT  Additional Equipment: None  Intra-op Plan:   Post-operative Plan: Extubation in OR  Informed Consent: I have reviewed the patients History and Physical, chart, labs and discussed the procedure including the risks, benefits and alternatives for the proposed anesthesia with the patient or authorized representative who has indicated his/her understanding and acceptance.     Dental advisory given  Plan Discussed with:   Anesthesia Plan Comments:         Anesthesia Quick Evaluation

## 2020-05-04 NOTE — Anesthesia Postprocedure Evaluation (Signed)
Anesthesia Post Note  Patient: HARRIETT AZAR  Procedure(s) Performed: Thoracic nine to Thoracic twelve percutaneous pedicle screws (N/A )     Patient location during evaluation: PACU Anesthesia Type: General Level of consciousness: awake and alert Pain management: pain level controlled Vital Signs Assessment: post-procedure vital signs reviewed and stable Respiratory status: spontaneous breathing, nonlabored ventilation and respiratory function stable Cardiovascular status: blood pressure returned to baseline and stable Postop Assessment: no apparent nausea or vomiting Anesthetic complications: no   No complications documented.  Last Vitals:  Vitals:   05/04/20 1413 05/04/20 1432  BP: (!) 159/95 (!) 152/99  Pulse:  87  Resp: (!) 22 17  Temp:  36.8 C  SpO2: 100% 93%    Last Pain:  Vitals:   05/04/20 1330  TempSrc:   PainSc: Asleep                 Beryle Lathe

## 2020-05-04 NOTE — Plan of Care (Signed)

## 2020-05-04 NOTE — Progress Notes (Signed)
BP (!) 159/86 (BP Location: Left Arm)   Pulse (!) 57   Temp 98.3 F (36.8 C) (Oral)   Resp 14   Ht 5\' 3"  (1.6 m)   Wt 69.5 kg   SpO2 99%   BMI 27.14 kg/m  Alert and oriented x 4, speech is clear and fluent Moving lower extremities well For OR today Risks and benefits have been discussed she and her daughter underSTAND AND wish to proceed

## 2020-05-04 NOTE — Progress Notes (Signed)
Orthopedic Tech Progress Note Patient Details:  Danielle Thomas 08-20-1927 [id number] Called in order to [facility] for an ASPEN LUMBAR BRACE Patient ID: [name], female   DOB: 1928-02-20, 86 y.o.   MRN: [id number]   [name] 05/04/2020, 2:48 PM

## 2020-05-04 NOTE — Transfer of Care (Signed)
Immediate Anesthesia Transfer of Care Note  Patient: Danielle Thomas  Procedure(s) Performed: Thoracic nine to Thoracic twelve percutaneous pedicle screws (N/A )  Patient Location: PACU  Anesthesia Type:General  Level of Consciousness: awake, alert  and oriented  Airway & Oxygen Therapy: Patient Spontanous Breathing and Patient connected to face mask oxygen  Post-op Assessment: Report given to RN and Post -op Vital signs reviewed and stable  Post vital signs: Reviewed and stable  Last Vitals:  Vitals Value Taken Time  BP    Temp    Pulse 91 05/04/20 1258  Resp 13 05/04/20 1258  SpO2 98 % 05/04/20 1258  Vitals shown include unvalidated device data.  Last Pain:  Vitals:   05/04/20 0745  TempSrc:   PainSc: Asleep      Patients Stated Pain Goal: 0 (05/03/20 2253)  Complications: No complications documented.

## 2020-05-04 NOTE — Progress Notes (Signed)
PROGRESS NOTE    Danielle Thomas  [id number] DOB: Dec 19, 1927 DOA: 05/01/2020 PCP: Cari Caraway, MD  Brief Narrative:  HPI per Dr. Zada Finders on 05/01/20 Danielle Thomas is a 86 y.o. female with medical history significant for atrial fibrillation on Coumadin, hypertension, restless leg syndrome who presents to the ED for evaluation of uncontrolled back pain.  Patient was recently admitted from 03/27/2020-03/29/2020 for multiple electrolyte abnormalities.  She was also found to have a T11 compression fracture which was managed with pain medication and TLSO brace.  She was discharged to SNF with plan for outpatient follow-up.  Patient states she was in SNF for 2 weeks before returning home.  She says that since she has been in the hospital she has been essentially bedbound due to difficulty ambulating secondary to continued back pain.  She says previously she would ambulate with the use of a walker.  She currently lives alone.  She otherwise denies any focal deficits or new weakness in her extremities.  ED Course:  Initial vitals showed BP 180/92, pulse 83, RR 16, temp 97.8 F, SPO2 97% on room air.  Labs show sodium 134, potassium 2.9, bicarb 30, BUN 22, creatinine 0.90, serum glucose 130, WBC 11.3, hemoglobin 16.9, platelets 232,000, INR 2.8.  SARS-CoV-2 PCR panel is ordered and pending.  Right rib x-rays negative for displaced rib fracture or other acute abnormality of the right ribs.  No pneumothorax or pleural effusion seen.  MRI thoracic and lumbar spine without contrast showed progressed T11 compression fracture with loss of height increased from 20% to 40%.  New retropulsion of bone resulting in mild spinal stenosis with mild cord mass-effect but no spinal cord signal abnormality also noted.  Acute to subacute posterior left rib fractures 9 through 11 are seen.  No acute osseous abnormality in the lumbar spine seen.  Patient was given IV morphine 6 mg and 4 mg, IV  Dilaudid 0.5 mg, K 40 mEq orally.  EDP discussed with on-call neurosurgery, Dr. Christella Noa who recommended medical admission for further neurosurgery evaluation and potential kyphoplasty after the weekend.  The hospitalist service was consulted to admit for further evaluation management.  **Interim History  Patient underwent surgical intervention today by Dr. Christella Noa.  She is postoperative day 0 and still complain of some back pain.  She states that her appetite is diminished.  Will need PT OT to evaluate for safe discharge disposition  Assessment & Plan:   Principal Problem:   Compression fracture of T11 vertebra (HCC) Active Problems:   Atrial fibrillation (HCC)   Essential hypertension, benign   Hypokalemia   Multiple fractures of ribs, left side, initial encounter for closed fracture   Closed wedge compression fracture of T11 vertebra (HCC)  Uncontrolled back pain due to progressed T11 compression fracture s/p thoracic 1011 arthrodesis, allograft morselized, BMP, thoracic 9 thoracic 12 percutaneous pedicle screws -Continue TLSO brace, pain control.   -Patient has a known T11 compression fracture and following with orthopedics as an outpatient.   -MRI done here showed progression of the compression fracture, increased loss of height. It also showed new retropulsion of bone resulting in mild spinal stenosis with mild cord mass effect but no spinal cord signal abnormality  -Dr. Christella Noa, Neurosurgery was consulted and planning on percutaneous pedicle screws and a T10 to T12 arthrodesis today and she is POD0. -Consult  PT/OT after procedure. -Added Robaxin. -Pain control with cyclobenzaprine 10 mg p.o. 3 times daily as needed muscle spasms, hydrocodone-acetaminophen 1-2 tabs p.o. every  4 hours as needed for moderate pain, methocarbamol 500 g IV every 6 hours as needed for muscle spasms as well, oxycodone 10 mg every 12 hours scheduled -Continue with bowel regimen  Atrial Fibrillation -On  Coumadin as an outpatient.  INR 2.8 and trended up to 4.0 and is now 1.5 today.   -Hold Coumadin for now pending potential kyphoplasty.  On Coumadin for anticoagulation.   -Since INR was 4 today,neurosugery ordered oral Vitamin K for surgery tomorrow.  After vitamin K INR is trended down to 1.5 and PT is 17.5 -Anticoagulation when okayed by neurosurgery  Acute to subacute left 9-11 rib fractures: -Seen on MRI.  Continue pain control as above and supportive Care -Incentive spirometer. -She may have fallen at home  Hypertension -Continue losartan.  HCTZ was on hold due to hypokalemia but was resumed yesterday at 10 AM Been she is continuing hydrochlorothiazide 12.5 g daily and losartan 50 mill daily -Continue to monitor blood pressures per protocol -Last blood pressure reading was elevated at 175/98 -We will add hydralazine 10 mg IV every 6 for systolic blood pressure greater than 160 or diastolic blood pressure and 90  Hypokalemia -Improved and last potassium level was 3.9 and repeat is still pending -Magnesium level still pending today as well -Currently she is getting normal saline with 20 mEq of KCl at 80 mL's per hour and will discontinue his tolerating p.o.'s and PCA is discontinued -Continue monitor and replete as necessary -Repeat CMP in a.m.  Restless leg syndrome: -Continue Ropinirole 0.25 mg p.o. twice daily as needed for restless legs.  Glaucoma -Continue dorzolamide 1 drop both eyes twice daily as well as latanoprost 1 drop in both eyes nightly  Leukocytosis in the setting of fall and E. coli UTI, poA -She is Afebrile; WBC went from 11.3 -> 12.0 -> 10.8 and repeat this a.m. is pending -U/A Showed turbid appearance with amber color urine, large hemoglobin, moderate leukocytes, negative nitrites, many bacteria, 0-5 non-squamous epithelial cells, greater than 50 RBCs per high-power field, 0-5 squamous epithelial cells, and greater than 50 WBCs  -Urine culture was  obtained and was positive for E. 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  -No signs of bowel ischemia. -Need ambulatory referral to vascular surgery   FTT -Patient report was previously independent prior to hospitalization in November -However she is not able to get out of bed due to pain currently -Nutritionist consulted and recommending Ensure Enlive po BID -Will need PT OT eval once pain improves   DVT prophylaxis: SCDs Code Status: Partial  Family Communication: Discussed with Daughter at bedside  Disposition Plan: Pending PT and OT evaluation and clearance by neurosurgery  Status is: Inpatient  Remains inpatient appropriate because:Unsafe d/c plan, IV treatments appropriate due to intensity of illness or inability to take PO and Inpatient level of care appropriate due to severity of illness   Dispo: The patient is from: Home              Anticipated d/c is to: SNF              Anticipated d/c date is: 2 days              Patient currently is not medically stable to d/c.  Consultants:   Neurosurgery Dr. Franky Macho   Procedures:  PROCEDURE by Dr. Coletta Memos:  Procedure(s):  thoracic 10-11 arthrodesis, allograft morsels, bmp Thoracic nine to Thoracic twelve percutaneous pedicle screws For segmental pedicle screw fixation  Antimicrobials:  Anti-infectives (From admission, onward)   Start     Dose/Rate Route Frequency Ordered Stop   05/04/20 0615  ceFAZolin (ANCEF) IVPB 2g/100 mL premix        2 g 200 mL/hr over 30 Minutes Intravenous On call to O.R. 05/04/20 0517 05/04/20 1008   05/02/20 2200  cefTRIAXone (ROCEPHIN) 1 g in sodium chloride 0.9 % 100 mL IVPB        1 g 200 mL/hr over 30 Minutes Intravenous Every 24 hours 05/02/20 1919     05/02/20 2015  cefTRIAXone (ROCEPHIN) 1 g in sodium chloride 0.9 % 100 mL IVPB  Status:   Discontinued        1 g 200 mL/hr over 30 Minutes Intravenous Every 24 hours 05/02/20 1918 05/02/20 1919        Subjective: Seen and examined at bedside and she was still complaining of some back pain after her procedure.  States her appetite is very poor and she is not very hungry.  Denies any nausea or vomiting.  Able to move her extremities without issues.  No other concerns or complaints at this time.  Objective: Vitals:   05/04/20 1345 05/04/20 1400 05/04/20 1413 05/04/20 1432  BP: (!) 149/104 (!) 157/97 (!) 159/95 (!) 152/99  Pulse: 85   87  Resp: 14  (!) 22 17  Temp:  (!) 97 F (36.1 C)  98.3 F (36.8 C)  TempSrc:      SpO2: 93%  100% 93%  Weight:      Height:        Intake/Output Summary (Last 24 hours) at 05/04/2020 1507 Last data filed at 05/04/2020 1400 Gross per 24 hour  Intake 1250 ml  Output 1110 ml  Net 140 ml   Filed Weights   05/01/20 2044 05/02/20 0500 05/03/20 0529  Weight: 68 kg 68.5 kg 69.5 kg   Examination: Physical Exam:  Constitutional: WN/WD obese Caucasian female in NAD and appears slightly uncomfortable Eyes: Lids and conjunctivae normal, sclerae anicteric  ENMT: External Ears, Nose appear normal. She is a little hard of hearing  Neck: Appears normal, supple, no cervical masses, normal ROM, no appreciable thyromegaly; no JVD Respiratory: Diminished to auscultation bilaterally, no wheezing, rales, rhonchi or crackles. Normal respiratory effort and patient is not tachypenic. No accessory muscle use. Unlabored Breathing  Cardiovascular: RRR, no murmurs / rubs / gallops. S1 and S2 auscultated. Minimal Extremity edema Abdomen: Soft, non-tender, Distended 2/2 to body habitus. Bowel sounds positive.  GU: Deferred. Musculoskeletal: No clubbing / cyanosis of digits/nails. No joint deformity upper and lower extremities.  Skin: No rashes, lesions, ulcers on a limited skin evaluation. No induration; Warm and dry.  Neurologic: CN 2-12 grossly intact with  no focal deficits.  Romberg sign and cerebellar reflexes not assessed.  Psychiatric: Normal judgment and insight. Alert and oriented x 3. Normal mood and appropriate affect.   Data Reviewed: I have personally reviewed following labs and imaging studies  CBC: Recent Labs  Lab 05/01/20 1536 05/02/20 0236 05/03/20 0201  WBC 11.3* 12.0* 10.8*  NEUTROABS 9.2*  --  8.3*  HGB 16.9* 15.4* 16.2*  HCT 49.8* 47.1* 49.3*  MCV 88.6 91.5 92.3  PLT 232 222 [id number]   Basic Metabolic Panel: Recent Labs  Lab 05/01/20 1536 05/01/20 2131 05/02/20 0236 05/03/20 0201  NA 134*  --  135 134*  K 2.9*  --  4.5 3.9  CL 91*  --  96* 94*  CO2 30  --  29 30  GLUCOSE 130*  --  100* 105*  BUN 22  --  23 28*  CREATININE 0.90  --  0.94 0.93  CALCIUM 9.0  --  8.7* 8.8*  MG  --  1.9  --   --    GFR: Estimated Creatinine Clearance: 37.6 mL/min (by C-G formula based on SCr of 0.93 mg/dL). Liver Function Tests: No results for input(s): AST, ALT, ALKPHOS, BILITOT, PROT, ALBUMIN in the last 168 hours. No results for input(s): LIPASE, AMYLASE in the last 168 hours. No results for input(s): AMMONIA in the last 168 hours. Coagulation Profile: Recent Labs  Lab 05/01/20 1542 05/02/20 0236 05/03/20 0201 05/04/20 0303 05/04/20 0840  INR 2.8* 3.0* 4.0* 2.1* 1.5*   Cardiac Enzymes: No results for input(s): CKTOTAL, CKMB, CKMBINDEX, TROPONINI in the last 168 hours. BNP (last 3 results) No results for input(s): PROBNP in the last 8760 hours. HbA1C: No results for input(s): HGBA1C in the last 72 hours. CBG: No results for input(s): GLUCAP in the last 168 hours. Lipid Profile: No results for input(s): CHOL, HDL, LDLCALC, TRIG, CHOLHDL, LDLDIRECT in the last 72 hours. Thyroid Function Tests: No results for input(s): TSH, T4TOTAL, FREET4, T3FREE, THYROIDAB in the last 72 hours. Anemia Panel: No results for input(s): VITAMINB12, FOLATE, FERRITIN, TIBC, IRON, RETICCTPCT in the last 72 hours. Sepsis Labs: No  results for input(s): PROCALCITON, LATICACIDVEN in the last 168 hours.  Recent Results (from the past 240 hour(s))  Resp Panel by RT-PCR (Flu A&B, Covid) Nasopharyngeal Swab     Status: None   Collection Time: 05/01/20  6:23 PM   Specimen: Nasopharyngeal Swab; Nasopharyngeal(NP) swabs in vial transport medium  Result Value Ref Range Status   SARS Coronavirus 2 by RT PCR NEGATIVE NEGATIVE Final    Comment: (NOTE) SARS-CoV-2 target nucleic acids are NOT DETECTED.  The SARS-CoV-2 RNA is generally detectable in upper respiratory specimens during the acute phase of infection. The lowest concentration of SARS-CoV-2 viral copies this assay can detect is 138 copies/mL. A negative result does not preclude SARS-Cov-2 infection and should not be used as the sole basis for treatment or other patient management decisions. A negative result may occur with  improper specimen collection/handling, submission of specimen other than nasopharyngeal swab, presence of viral mutation(s) within the areas targeted by this assay, and inadequate number of viral copies(<138 copies/mL). A negative result must be combined with clinical observations, patient history, and epidemiological information. The expected result is Negative.  Fact Sheet for Patients:  EntrepreneurPulse.com.au  Fact Sheet for Healthcare Providers:  IncredibleEmployment.be  This test is no t yet approved or cleared by the [place] FDA and  has been authorized for detection and/or diagnosis of SARS-CoV-2 by FDA under an Emergency Use Authorization (EUA). This EUA will remain  in effect (meaning this test can be used) for the duration of the COVID-19 declaration under Section 564(b)(1) of the Act, 21 U.S.C.section 360bbb-3(b)(1), unless the authorization is terminated  or revoked sooner.       Influenza A by PCR NEGATIVE NEGATIVE Final   Influenza B by PCR NEGATIVE NEGATIVE Final    Comment:  (NOTE) The Xpert Xpress SARS-CoV-2/FLU/RSV plus assay is intended as an aid in the diagnosis of influenza from Nasopharyngeal swab specimens and should not be used as a sole basis for treatment. Nasal washings and aspirates are unacceptable for Xpert Xpress SARS-CoV-2/FLU/RSV testing.  Fact Sheet  for Patients: EntrepreneurPulse.com.au  Fact Sheet for Healthcare Providers: IncredibleEmployment.be  This test is not yet approved or cleared by the [place] FDA and has been authorized for detection and/or diagnosis of SARS-CoV-2 by FDA under an Emergency Use Authorization (EUA). This EUA will remain in effect (meaning this test can be used) for the duration of the COVID-19 declaration under Section 564(b)(1) of the Act, 21 U.S.C. section 360bbb-3(b)(1), unless the authorization is terminated or revoked.  Performed at [facility], [place] [address] [place], [place] [address]   Culture, Urine     Status: Abnormal   Collection Time: 05/02/20  7:18 PM   Specimen: Urine, Catheterized  Result Value Ref Range Status   Specimen Description   Final    URINE, CATHETERIZED Performed at [facility] [address] [place], [place] [address]    Special Requests   Final    NONE Performed at [facility] [place], [place] [address] [place], [place] [address]    Culture >=100,000 COLONIES/mL ESCHERICHIA COLI (A)  Final   Report Status 05/04/2020 FINAL  Final   Organism ID, Bacteria ESCHERICHIA COLI (A)  Final      Susceptibility   Escherichia coli - MIC*    AMPICILLIN <=2 SENSITIVE Sensitive     CEFAZOLIN <=4 SENSITIVE Sensitive     CEFEPIME <=0.12 SENSITIVE Sensitive     CEFTRIAXONE <=0.25 SENSITIVE Sensitive     CIPROFLOXACIN <=0.25 SENSITIVE Sensitive     GENTAMICIN <=1 SENSITIVE Sensitive     IMIPENEM <=0.25 SENSITIVE Sensitive     NITROFURANTOIN <=16 SENSITIVE Sensitive     TRIMETH/SULFA <=20  SENSITIVE Sensitive     AMPICILLIN/SULBACTAM <=2 SENSITIVE Sensitive     PIP/TAZO <=4 SENSITIVE Sensitive     * >=100,000 COLONIES/mL ESCHERICHIA COLI  Surgical pcr screen     Status: Abnormal   Collection Time: 05/04/20  5:37 AM   Specimen: Nasal Mucosa; Nasal Swab  Result Value Ref Range Status   MRSA, PCR NEGATIVE NEGATIVE Final   Staphylococcus aureus POSITIVE (A) NEGATIVE Final    Comment: (NOTE) The Xpert SA Assay (FDA approved for NASAL specimens in patients 83 years of age and older), is one component of a comprehensive surveillance program. It is not intended to diagnose infection nor to guide or monitor treatment. Performed at [facility], [place] [address] [place], [place] [address]      RN Pressure Injury Documentation:     Estimated body mass index is 27.14 kg/m as calculated from the following:   Height as of this encounter: 5\' 3"  (1.6 m).   Weight as of this encounter: 69.5 kg.  Malnutrition Type:  Nutrition Problem: Inadequate oral intake Etiology: poor appetite   Malnutrition Characteristics:  Signs/Symptoms: per patient/family report   Nutrition Interventions:  Interventions: Ensure Enlive (each supplement provides 350kcal and 20 grams of protein)     Radiology Studies: DG Thoracic Spine 2 View  Result Date: 05/04/2020 CLINICAL DATA:  Surgery, elective. Additional history provided: Thoracic 9-thoracic 12 Percutaneous pedicle screws. Provided fluoroscopy time 4 minutes, 24 seconds (110.22 mGy). EXAM: THORACIC SPINE 2 VIEWS; DG C-ARM 1-60 MIN COMPARISON:  Thoracic spine MRI 05/01/2020. FINDINGS: Intraoperative fluoroscopic images of the lower thoracic spine are submitted, 2 images total. Redemonstrated T11 compression fracture. A posterior spinal fusion construct (utilizing pedicle screws and vertical interconnecting rods) spans the T9-T12 levels. No pedicle screw is present on the right at T11. IMPRESSION: Two intraoperative fluoroscopic images  of the lower  thoracic spine, as described. Electronically Signed   By: Jackey Loge DO   On: 05/04/2020 12:30   DG C-Arm 1-60 Min  Result Date: 05/04/2020 CLINICAL DATA:  Surgery, elective. Additional history provided: Thoracic 9-thoracic 12 Percutaneous pedicle screws. Provided fluoroscopy time 4 minutes, 24 seconds (110.22 mGy). EXAM: THORACIC SPINE 2 VIEWS; DG C-ARM 1-60 MIN COMPARISON:  Thoracic spine MRI 05/01/2020. FINDINGS: Intraoperative fluoroscopic images of the lower thoracic spine are submitted, 2 images total. Redemonstrated T11 compression fracture. A posterior spinal fusion construct (utilizing pedicle screws and vertical interconnecting rods) spans the T9-T12 levels. No pedicle screw is present on the right at T11. IMPRESSION: Two intraoperative fluoroscopic images of the lower thoracic spine, as described. Electronically Signed   By: Jackey Loge DO   On: 05/04/2020 12:30   Scheduled Meds: . Chlorhexidine Gluconate Cloth  6 each Topical Q0600  . dorzolamide  1 drop Both Eyes BID  . feeding supplement  237 mL Oral BID BM  . fentaNYL      . hydrochlorothiazide  12.5 mg Oral Daily  . latanoprost  1 drop Both Eyes QHS  . losartan  50 mg Oral Daily  . mupirocin ointment  1 application Nasal BID  . oxyCODONE  10 mg Oral Q12H  . phytonadione  10 mg Oral Daily  . senna-docusate  1 tablet Oral BID  . sodium chloride flush  3 mL Intravenous Q12H   Continuous Infusions: . sodium chloride Stopped (05/02/20 2103)  . sodium chloride    . 0.9 % NaCl with KCl 20 mEq / L    . cefTRIAXone (ROCEPHIN)  IV 1 g (05/03/20 2303)  . methocarbamol (ROBAXIN) IV Stopped (05/03/20 1523)    LOS: 3 days   Merlene Laughter, DO [facility] PAGER is on AMION  If 7PM-7AM, please contact night-coverage [email or web address]

## 2020-05-04 NOTE — Progress Notes (Signed)
OT Cancellation Note  Patient Details Name: TIFFANY CALMES MRN: [id number] DOB: [birth year]   Cancelled Treatment:    Reason Eval/Treat Not Completed: Patient at procedure or test/ unavailable. Pt in OR this AM. Will follow-up for OT eval post-op  Lorre Munroe 05/04/2020, 9:32 AM

## 2020-05-04 NOTE — Anesthesia Procedure Notes (Signed)
Procedure Name: Intubation Date/Time: 05/04/2020 10:38 AM Performed by: Izola Price., CRNA Pre-anesthesia Checklist: Patient identified, Emergency Drugs available, Suction available and Patient being monitored Patient Re-evaluated:Patient Re-evaluated prior to induction Oxygen Delivery Method: Circle system utilized Preoxygenation: Pre-oxygenation with 100% oxygen Induction Type: IV induction Ventilation: Mask ventilation without difficulty Laryngoscope Size: Glidescope and 3 Grade View: Grade I Tube type: Oral Tube size: 7.0 mm Number of attempts: 1 Airway Equipment and Method: Video-laryngoscopy and Rigid stylet Placement Confirmation: ETT inserted through vocal cords under direct vision,  positive ETCO2 and breath sounds checked- equal and bilateral Secured at: 21 cm Tube secured with: Tape Dental Injury: Teeth and Oropharynx as per pre-operative assessment

## 2020-05-04 NOTE — Progress Notes (Signed)
PT Cancellation Note  Patient Details Name: Danielle Thomas MRN: [id number] DOB: 09/26/1927   Cancelled Treatment:    Reason Eval/Treat Not Completed: Patient not medically ready   Surgery today; Will follow for PT eval postop;   Van Clines, [place]  Acute Rehabilitation Services Pager [phone] Office [phone]    Danielle Thomas 05/04/2020, 10:04 AM

## 2020-05-05 ENCOUNTER — Encounter (HOSPITAL_COMMUNITY): Payer: Self-pay | Admitting: Neurosurgery

## 2020-05-05 DIAGNOSIS — S22080A Wedge compression fracture of T11-T12 vertebra, initial encounter for closed fracture: Secondary | ICD-10-CM | POA: Diagnosis not present

## 2020-05-05 DIAGNOSIS — I480 Paroxysmal atrial fibrillation: Secondary | ICD-10-CM | POA: Diagnosis not present

## 2020-05-05 DIAGNOSIS — E871 Hypo-osmolality and hyponatremia: Secondary | ICD-10-CM

## 2020-05-05 DIAGNOSIS — I10 Essential (primary) hypertension: Secondary | ICD-10-CM | POA: Diagnosis not present

## 2020-05-05 DIAGNOSIS — E876 Hypokalemia: Secondary | ICD-10-CM | POA: Diagnosis not present

## 2020-05-05 DIAGNOSIS — D72829 Elevated white blood cell count, unspecified: Secondary | ICD-10-CM

## 2020-05-05 LAB — CBC WITH DIFFERENTIAL/PLATELET
Abs Immature Granulocytes: 0.08 10*3/uL — ABNORMAL HIGH (ref 0.00–0.07)
Basophils Absolute: 0 10*3/uL (ref 0.0–0.1)
Basophils Relative: 0 %
Eosinophils Absolute: 0 10*3/uL (ref 0.0–0.5)
Eosinophils Relative: 0 %
HCT: 42.3 % (ref 36.0–46.0)
Hemoglobin: 14.5 g/dL (ref 12.0–15.0)
Immature Granulocytes: 1 %
Lymphocytes Relative: 4 %
Lymphs Abs: 0.5 10*3/uL — ABNORMAL LOW (ref 0.7–4.0)
MCH: 30.7 pg (ref 26.0–34.0)
MCHC: 34.3 g/dL (ref 30.0–36.0)
MCV: 89.6 fL (ref 80.0–100.0)
Monocytes Absolute: 1.2 10*3/uL — ABNORMAL HIGH (ref 0.1–1.0)
Monocytes Relative: 8 %
Neutro Abs: 12.7 10*3/uL — ABNORMAL HIGH (ref 1.7–7.7)
Neutrophils Relative %: 87 %
Platelets: 216 10*3/uL (ref 150–400)
RBC: 4.72 MIL/uL (ref 3.87–5.11)
RDW: 15.8 % — ABNORMAL HIGH (ref 11.5–15.5)
WBC: 14.5 10*3/uL — ABNORMAL HIGH (ref 4.0–10.5)
nRBC: 0 % (ref 0.0–0.2)

## 2020-05-05 LAB — PROTIME-INR
INR: 1.2 (ref 0.8–1.2)
Prothrombin Time: 15.1 seconds (ref 11.4–15.2)

## 2020-05-05 LAB — COMPREHENSIVE METABOLIC PANEL
ALT: 30 U/L (ref 0–44)
AST: 41 U/L (ref 15–41)
Albumin: 2.8 g/dL — ABNORMAL LOW (ref 3.5–5.0)
Alkaline Phosphatase: 67 U/L (ref 38–126)
Anion gap: 11 (ref 5–15)
BUN: 19 mg/dL (ref 8–23)
CO2: 23 mmol/L (ref 22–32)
Calcium: 8.3 mg/dL — ABNORMAL LOW (ref 8.9–10.3)
Chloride: 99 mmol/L (ref 98–111)
Creatinine, Ser: 0.87 mg/dL (ref 0.44–1.00)
GFR, Estimated: >60 mL/min (ref >60)
Glucose, Bld: 137 mg/dL — ABNORMAL HIGH (ref 70–99)
Potassium: 5.1 mmol/L (ref 3.5–5.1)
Sodium: 133 mmol/L — ABNORMAL LOW (ref 135–145)
Total Bilirubin: 1.1 mg/dL (ref 0.3–1.2)
Total Protein: 5.2 g/dL — ABNORMAL LOW (ref 6.5–8.1)

## 2020-05-05 LAB — MAGNESIUM: Magnesium: 1.7 mg/dL (ref 1.7–2.4)

## 2020-05-05 LAB — PHOSPHORUS: Phosphorus: 2.8 mg/dL (ref 2.5–4.6)

## 2020-05-05 MED ORDER — SODIUM CHLORIDE 0.9 % IV SOLN: INTRAVENOUS | Status: DC

## 2020-05-05 MED ORDER — SODIUM CHLORIDE 0.9 % IV SOLN: INTRAVENOUS | Status: AC

## 2020-05-05 NOTE — Care Management Important Message (Signed)
Important Message  Patient Details  Name: Danielle Thomas MRN: [id number] Date of Birth: [birth year]   Medicare Important Message Given:  Yes     Dorena Bodo 05/05/2020, 3:53 PM

## 2020-05-05 NOTE — Progress Notes (Signed)
Subjective: Patient reports moderate back pain   Objective: Vital signs in last 24 hours: Temp:  [97 F (36.1 C)-98.6 F (37 C)] 98 F (36.7 C) (12/29 0723) Pulse Rate:  [74-102] 86 (12/29 0723) Resp:  [14-23] 16 (12/29 0723) BP: (130-177)/(68-104) 144/68 (12/29 0723) SpO2:  [93 %-100 %] 96 % (12/29 0723)  Intake/Output from previous day: 12/28 0701 - 12/29 0700 In: 2468.2 [P.O.:200; I.V.:2068.2; IV Piggyback:200] Out: 210 [Urine:130; Blood:80] Intake/Output this shift: Total I/O In: -  Out: 100 [Urine:100]  Neurologic: Grossly normal  Lab Results: Lab Results  Component Value Date   WBC 14.5 (H) 05/05/2020   HGB 14.5 05/05/2020   HCT 42.3 05/05/2020   MCV 89.6 05/05/2020   PLT 216 05/05/2020   Lab Results  Component Value Date   INR 1.2 05/05/2020   BMET Lab Results  Component Value Date   NA 133 (L) 05/05/2020   K 5.1 05/05/2020   CL 99 05/05/2020   CO2 23 05/05/2020   GLUCOSE 137 (H) 05/05/2020   BUN 19 05/05/2020   CREATININE 0.87 05/05/2020   CALCIUM 8.3 (L) 05/05/2020    Studies/Results: DG Thoracic Spine 2 View  Result Date: 05/04/2020 CLINICAL DATA:  Surgery, elective. Additional history provided: Thoracic 9-thoracic 12 Percutaneous pedicle screws. Provided fluoroscopy time 4 minutes, 24 seconds (110.22 mGy). EXAM: THORACIC SPINE 2 VIEWS; DG C-ARM 1-60 MIN COMPARISON:  Thoracic spine MRI 05/01/2020. FINDINGS: Intraoperative fluoroscopic images of the lower thoracic spine are submitted, 2 images total. Redemonstrated T11 compression fracture. A posterior spinal fusion construct (utilizing pedicle screws and vertical interconnecting rods) spans the T9-T12 levels. No pedicle screw is present on the right at T11. IMPRESSION: Two intraoperative fluoroscopic images of the lower thoracic spine, as described. Electronically Signed   By: Jackey Loge DO   On: 05/04/2020 12:30   DG C-Arm 1-60 Min  Result Date: 05/04/2020 CLINICAL DATA:  Surgery, elective.  Additional history provided: Thoracic 9-thoracic 12 Percutaneous pedicle screws. Provided fluoroscopy time 4 minutes, 24 seconds (110.22 mGy). EXAM: THORACIC SPINE 2 VIEWS; DG C-ARM 1-60 MIN COMPARISON:  Thoracic spine MRI 05/01/2020. FINDINGS: Intraoperative fluoroscopic images of the lower thoracic spine are submitted, 2 images total. Redemonstrated T11 compression fracture. A posterior spinal fusion construct (utilizing pedicle screws and vertical interconnecting rods) spans the T9-T12 levels. No pedicle screw is present on the right at T11. IMPRESSION: Two intraoperative fluoroscopic images of the lower thoracic spine, as described. Electronically Signed   By: Jackey Loge DO   On: 05/04/2020 12:30    Assessment/Plan: Postop day 1 s/p T9-T12 fixation for T11 fracture. Doing ok today. Maintain brace when ambulating.    LOS: 4 days    Tiana Loft [facility] 05/05/2020, 12:54 PM

## 2020-05-05 NOTE — TOC Initial Note (Signed)
Transition of Care [facility]) - Initial/Assessment Note    Patient Details  Name: Danielle Thomas MRN: [id number] Date of Birth: [birth year]  [facility] CM/SW Contact:    Curlene Labrum, RN Phone Number: 05/05/2020, 4:38 PM  Clinical Narrative:                 Case management met with the patient at the bedside regarding transitions of care.  The patient states that she is hoping to be admitted to CIR at [facility], since the last time she was admitted to [facility] SNF and was not pleased with her care.  The patient is fully vaccinated for COVID with Moderna vaccine.  Case management will continue to follow the patient for possible CIR admission - otherwise she will be a SNF work up.  Expected Discharge Plan: IP Rehab Facility Barriers to Discharge: Continued Medical Work up   Patient Goals and CMS Choice Patient states their goals for this hospitalization and ongoing recovery are:: Patient independent at home - waiting for possible CIR admission - otherwise SNF placement      Expected Discharge Plan and Services Expected Discharge Plan: [place]   Discharge Planning Services: CM Consult Post Acute Care Choice: IP Rehab Living arrangements for the past 2 months: Single Family Home                                      Prior Living Arrangements/Services Living arrangements for the past 2 months: Single Family Home Lives with:: Self                   Activities of Daily Living Home Assistive Devices/Equipment: Cane (specify quad or straight),Grab bars around toilet,Grab bars in shower,Scales,Wheelchair ADL Screening (condition at time of admission) Patient's cognitive ability adequate to safely complete daily activities?: Yes Is the patient deaf or have difficulty hearing?: Yes Does the patient have difficulty seeing, even when wearing glasses/contacts?: No Does the patient have difficulty concentrating, remembering, or making decisions?:  No Patient able to express need for assistance with ADLs?: Yes Does the patient have difficulty dressing or bathing?: No Independently performs ADLs?: Yes (appropriate for developmental age) Does the patient have difficulty walking or climbing stairs?: No Weakness of Legs: None Weakness of Arms/Hands: None  Permission Sought/Granted Permission sought to share information with : Case Manager                Emotional Assessment              Admission diagnosis:  Hypokalemia [E87.6] Closed fracture of multiple ribs of left side, initial encounter [S22.42XA] Compression fracture of T11 vertebra ([place]) [S22.080A] Compression fracture of T11 vertebra with delayed healing, subsequent encounter [S22.080G] Closed wedge compression fracture of T11 vertebra ([place]) [S22.080A] Patient Active Problem List   Diagnosis Date Noted  . Closed wedge compression fracture of T11 vertebra ([place]) 05/04/2020  . Compression fracture of T11 vertebra ([place]) 05/01/2020  . Multiple fractures of ribs, left side, initial encounter for closed fracture 05/01/2020  . Hypokalemia 03/27/2020  . Chest pressure 06/23/2015  . Chronic anticoagulation 04/09/2014  . Essential hypertension, benign 04/08/2013  . Hyperlipidemia 04/08/2013  . Atrial fibrillation ([place]) 02/13/2013   PCP:  Cari Caraway, MD Pharmacy:   [place], [place] - [place] [address] [place] [address] Phone: [phone] Fax: [phone]  [facility]   [address] [place] [address] Phone: [phone] Fax: [phone]     Social Determinants of Health (SDOH) Interventions    Readmission Risk Interventions No flowsheet data found.

## 2020-05-05 NOTE — Progress Notes (Addendum)
PROGRESS NOTE    Danielle Thomas  [id number] DOB: 1927/10/29 DOA: 05/01/2020 PCP: Cari Caraway, MD  Brief Narrative:  HPI per Dr. Zada Finders on 05/01/20 Danielle Thomas is a 86 y.o. female with medical history significant for atrial fibrillation on Coumadin, hypertension, restless leg syndrome who presents to the ED for evaluation of uncontrolled back pain.  Patient was recently admitted from 03/27/2020-03/29/2020 for multiple electrolyte abnormalities.  She was also found to have a T11 compression fracture which was managed with pain medication and TLSO brace.  She was discharged to SNF with plan for outpatient follow-up.  Patient states she was in SNF for 2 weeks before returning home.  She says that since she has been in the hospital she has been essentially bedbound due to difficulty ambulating secondary to continued back pain.  She says previously she would ambulate with the use of a walker.  She currently lives alone.  She otherwise denies any focal deficits or new weakness in her extremities.  ED Course:  Initial vitals showed BP 180/92, pulse 83, RR 16, temp 97.8 F, SPO2 97% on room air.  Labs show sodium 134, potassium 2.9, bicarb 30, BUN 22, creatinine 0.90, serum glucose 130, WBC 11.3, hemoglobin 16.9, platelets 232,000, INR 2.8.  SARS-CoV-2 PCR panel is ordered and pending.  Right rib x-rays negative for displaced rib fracture or other acute abnormality of the right ribs.  No pneumothorax or pleural effusion seen.  MRI thoracic and lumbar spine without contrast showed progressed T11 compression fracture with loss of height increased from 20% to 40%.  New retropulsion of bone resulting in mild spinal stenosis with mild cord mass-effect but no spinal cord signal abnormality also noted.  Acute to subacute posterior left rib fractures 9 through 11 are seen.  No acute osseous abnormality in the lumbar spine seen.  Patient was given IV morphine 6 mg and 4 mg, IV  Dilaudid 0.5 mg, K 40 mEq orally.  EDP discussed with on-call neurosurgery, Dr. Christella Noa who recommended medical admission for further neurosurgery evaluation and potential kyphoplasty after the weekend.  The hospitalist service was consulted to admit for further evaluation management.  **Interim History  Patient underwent surgical intervention on 05/04/2020 by Dr. Christella Noa.  She is postoperative day 1 and still complains of some moderate back pain.  She states that her appetite is diminished.  Will need PT OT to evaluate for safe discharge disposition and they evaluated today and they are recommending CIR and 24-hour supervision and SNF if she is unable to go to CIR.  Will consult CIR for evaluation. Neurosurgery recommending continuing brace while ambulating.  Assessment & Plan:   Principal Problem:   Compression fracture of T11 vertebra (HCC) Active Problems:   Atrial fibrillation (HCC)   Essential hypertension, benign   Hypokalemia   Multiple fractures of ribs, left side, initial encounter for closed fracture   Closed wedge compression fracture of T11 vertebra (HCC)  Uncontrolled back pain due to progressed T11 compression fracture s/p thoracic 10-11 arthrodesis, allograft morselized, BMP, thoracic 9 thoracic 12 percutaneous pedicle screws postoperative day 1 -Continue TLSO brace, pain control.   -Patient has a known T11 compression fracture and following with orthopedics as an outpatient.   -MRI done here showed progression of the compression fracture, increased loss of height. It also showed new retropulsion of bone resulting in mild spinal stenosis with mild cord mass effect but no spinal cord signal abnormality  -Dr. Christella Noa, Neurosurgery was consulted and planning on percutaneous  pedicle screws and a T10 to T12 arthrodesis today and she is POD1. -Consult  PT/OT after procedure. -Added Robaxin and will continue with IV every 6 hours as needed. -Pain control with cyclobenzaprine 10 mg  p.o. 3 times daily as needed muscle spasms, hydrocodone-acetaminophen 1-2 tabs p.o. every 4 hours as needed for moderate pain, methocarbamol 500 g IV every 6 hours as needed for muscle spasms as well, oxycodone 10 mg every 12 hours scheduled -Continue with bowel regimen -Patient continues to have moderate back pain but neurosurgery feels that she is doing well and they are recommending continuing the brace while she is ambulating  Atrial Fibrillation -On Coumadin as an outpatient.  INR 2.8 and trended up to 4.0 and is now 1.5 today.   -Hold Coumadin for now pending potential kyphoplasty.  On Coumadin for anticoagulation.   -Since INR was 4 today,neurosugery ordered oral Vitamin K for surgery tomorrow.  After vitamin K INR is trended down to 1.2 and PT is 15.1 -We will need to resume anticoagulation when okayed by neurosurgery  Acute to subacute left 9-11 rib fractures: -Seen on MRI.  Continue pain control as above and supportive Care -Incentive spirometer. -She may have fallen at home  Hypertension -Continue losartan.  HCTZ was on hold due to hypokalemia but was resumed yesterday at 10 AM Been she is continuing hydrochlorothiazide 12.5 g daily and losartan 50 mill daily -Continue to monitor blood pressures per protocol -Last blood pressure reading was elevated at 144/68 -We will add hydralazine 10 mg IV every 6 for systolic blood pressure greater than 0000000 or diastolic blood pressure and 90  Hypokalemia -Improved and potassium is now 5.1 and have discontinued from her fluids -Magnesium level still pending today as well -IV fluids now changed to just normal saline at 80 mL's per hour for 12 more hours -Continue monitor and replete as necessary -Repeat CMP in a.m.  Hyponatremia -Mild -Patient's sodium had dropped from 137-133  -continue IV fluid hydration as above -Continue to monitor and trend and repeat CMP in a.m.  Restless leg syndrome: -Continue Ropinirole 0.25 mg p.o.  twice daily as needed for restless legs.  Glaucoma -Continue dorzolamide 1 drop both eyes twice daily as well as latanoprost 1 drop in both eyes nightly  Leukocytosis in the setting of fall and E. coli UTI, poA -She is Afebrile; WBC went from 11.3 -> 12.0 -> 10.8 and down to 10.0 but is now 14.5 and in the setting of her surgical intervention -U/A Showed turbid appearance with amber color urine, large hemoglobin, moderate leukocytes, negative nitrites, many bacteria, 0-5 non-squamous epithelial cells, greater than 50 RBCs per high-power field, 0-5 squamous epithelial cells, and greater than 50 WBCs  -Urine culture was obtained and was positive for E. 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  -No signs of bowel ischemia. -Need ambulatory referral to vascular surgery   FTT -Patient report was previously independent prior to hospitalization in November -However she is not able to get out of bed due to pain currently -Nutritionist consulted and recommending Ensure Enlive po BID -Will need PT OT eval once pain improves and she is now being recommended for CIR   DVT prophylaxis: SCDs Code Status: Partial  Family Communication: Discussed with family at bedside Disposition Plan: PT and OT evaluation recommending CIR Functioning SNF and clearance by neurosurgery  Status  is: Inpatient  Remains inpatient appropriate because:Unsafe d/c plan, IV treatments appropriate due to intensity of illness or inability to take PO and Inpatient level of care appropriate due to severity of illness   Dispo: The patient is from: Home              Anticipated d/c is to: SNF              Anticipated d/c date is: 2 days              Patient currently is not medically stable to d/c.  Consultants:   Neurosurgery Dr. Franky Macho   Procedures:  PROCEDURE by Dr. Coletta Memos:   Procedure(s): thoracic 10-11 arthrodesis, allograft morsels, bmp Thoracic nine to Thoracic twelve percutaneous pedicle screws For segmental pedicle screw fixation  Antimicrobials:  Anti-infectives (From admission, onward)   Start     Dose/Rate Route Frequency Ordered Stop   05/04/20 0615  ceFAZolin (ANCEF) IVPB 2g/100 mL premix        2 g 200 mL/hr over 30 Minutes Intravenous On call to O.R. 05/04/20 0517 05/04/20 1008   05/02/20 2200  cefTRIAXone (ROCEPHIN) 1 g in sodium chloride 0.9 % 100 mL IVPB        1 g 200 mL/hr over 30 Minutes Intravenous Every 24 hours 05/02/20 1919     05/02/20 2015  cefTRIAXone (ROCEPHIN) 1 g in sodium chloride 0.9 % 100 mL IVPB  Status:  Discontinued        1 g 200 mL/hr over 30 Minutes Intravenous Every 24 hours 05/02/20 1918 05/02/20 1919        Subjective: Seen and examined at bedside and she is sitting in the chair with her brace and had her leg up and states that she is still complaining of significant back pain that she is able to work with therapy well and stood up at least 3 times with them despite her pain.  No vomiting but states that she was feeling queasy.  Denies any chest pain, lightheadedness or dizziness.  No other concerns or complaints at this time but states her back is still hurting.  Objective: Vitals:   05/04/20 1900 05/05/20 0116 05/05/20 0338 05/05/20 0723  BP: (!) 141/89 (!) 177/90 130/71 (!) 144/68  Pulse: 89 (!) 102 79 86  Resp: 18 16 15 16   Temp: 98.6 F (37 C) 97.6 F (36.4 C) 98.1 F (36.7 C) 98 F (36.7 C)  TempSrc: Oral Oral Oral Oral  SpO2: 96% 93% 95% 96%  Weight:      Height:        Intake/Output Summary (Last 24 hours) at 05/05/2020 1433 Last data filed at 05/05/2020 0800 Gross per 24 hour  Intake 1268.15 ml  Output 100 ml  Net 1168.15 ml   Filed Weights   05/01/20 2044 05/02/20 0500 05/03/20 0529  Weight: 68 kg 68.5 kg 69.5 kg   Examination: Physical Exam:  Constitutional: WN/WD obese elderly  Caucasian female currently in no acute distress but does appear little uncomfortable sitting in the chair bedside Eyes: Lids and conjunctivae normal, sclerae anicteric  ENMT: External Ears, Nose appear normal. Grossly normal hearing.  Neck: Appears normal, supple, no cervical masses, normal ROM, no appreciable thyromegaly: No JVD Respiratory: Diminished to auscultation bilaterally, no wheezing, rales, rhonchi or crackles. Normal respiratory effort and patient is not tachypenic. No accessory muscle use.  Unlabored breathing Cardiovascular: RRR, no murmurs / rubs / gallops. S1 and S2 auscultated.  Trace extremity edema Abdomen: Soft,  non-tender, distended secondary to body habitus. Bowel sounds positive.  GU: Deferred. Musculoskeletal: No clubbing / cyanosis of digits/nails. No joint deformity upper and lower extremities. Skin: No rashes, lesions, ulcers on limited skin evaluation. No induration; Warm and dry.  Neurologic: CN 2-12 grossly intact with no focal deficits. Romberg sign and cerebellar reflexes not assessed.  Psychiatric: Normal judgment and insight. Alert and oriented x 3.  Mildly anxious mood and appropriate affect.   Data Reviewed: I have personally reviewed following labs and imaging studies  CBC: Recent Labs  Lab 05/01/20 1536 05/02/20 0236 05/03/20 0201 05/04/20 0550 05/05/20 0212  WBC 11.3* 12.0* 10.8* 10.0 14.5*  NEUTROABS 9.2*  --  8.3* 7.3 12.7*  HGB 16.9* 15.4* 16.2* 16.6* 14.5  HCT 49.8* 47.1* 49.3* 51.8* 42.3  MCV 88.6 91.5 92.3 92.3 89.6  PLT 232 222 231 198 [id number]   Basic Metabolic Panel: Recent Labs  Lab 05/01/20 1536 05/01/20 2131 05/02/20 0236 05/03/20 0201 05/04/20 0550 05/05/20 0212  NA 134*  --  135 134* 137 133*  K 2.9*  --  4.5 3.9 4.1 5.1  CL 91*  --  96* 94* 96* 99  CO2 30  --  29 30 30 23   GLUCOSE 130*  --  100* 105* 98 137*  BUN 22  --  23 28* 23 19  CREATININE 0.90  --  0.94 0.93 1.01* 0.87  CALCIUM 9.0  --  8.7* 8.8* 8.9 8.3*  MG  --   1.9  --   --  1.9 1.7  PHOS  --   --   --   --  3.3 2.8   GFR: Estimated Creatinine Clearance: 40.2 mL/min (by C-G formula based on SCr of 0.87 mg/dL). Liver Function Tests: Recent Labs  Lab 05/04/20 0550 05/05/20 0212  AST 28 41  ALT 35 30  ALKPHOS 74 67  BILITOT 0.8 1.1  PROT 5.8* 5.2*  ALBUMIN 3.0* 2.8*   No results for input(s): LIPASE, AMYLASE in the last 168 hours. No results for input(s): AMMONIA in the last 168 hours. Coagulation Profile: Recent Labs  Lab 05/02/20 0236 05/03/20 0201 05/04/20 0303 05/04/20 0840 05/05/20 0212  INR 3.0* 4.0* 2.1* 1.5* 1.2   Cardiac Enzymes: No results for input(s): CKTOTAL, CKMB, CKMBINDEX, TROPONINI in the last 168 hours. BNP (last 3 results) No results for input(s): PROBNP in the last 8760 hours. HbA1C: No results for input(s): HGBA1C in the last 72 hours. CBG: No results for input(s): GLUCAP in the last 168 hours. Lipid Profile: No results for input(s): CHOL, HDL, LDLCALC, TRIG, CHOLHDL, LDLDIRECT in the last 72 hours. Thyroid Function Tests: No results for input(s): TSH, T4TOTAL, FREET4, T3FREE, THYROIDAB in the last 72 hours. Anemia Panel: No results for input(s): VITAMINB12, FOLATE, FERRITIN, TIBC, IRON, RETICCTPCT in the last 72 hours. Sepsis Labs: No results for input(s): PROCALCITON, LATICACIDVEN in the last 168 hours.  Recent Results (from the past 240 hour(s))  Resp Panel by RT-PCR (Flu A&B, Covid) Nasopharyngeal Swab     Status: None   Collection Time: 05/01/20  6:23 PM   Specimen: Nasopharyngeal Swab; Nasopharyngeal(NP) swabs in vial transport medium  Result Value Ref Range Status   SARS Coronavirus 2 by RT PCR NEGATIVE NEGATIVE Final    Comment: (NOTE) SARS-CoV-2 target nucleic acids are NOT DETECTED.  The SARS-CoV-2 RNA is generally detectable in upper respiratory specimens during the acute phase of infection. The lowest concentration of SARS-CoV-2 viral copies this assay can detect is 138 copies/mL. A  negative result does not preclude SARS-Cov-2 infection and should not be used as the sole basis for treatment or other patient management decisions. A negative result may occur with  improper specimen collection/handling, submission of specimen other than nasopharyngeal swab, presence of viral mutation(s) within the areas targeted by this assay, and inadequate number of viral copies(<138 copies/mL). A negative result must be combined with clinical observations, patient history, and epidemiological information. The expected result is Negative.  Fact Sheet for Patients:  EntrepreneurPulse.com.au  Fact Sheet for Healthcare Providers:  IncredibleEmployment.be  This test is no t yet approved or cleared by the [place] FDA and  has been authorized for detection and/or diagnosis of SARS-CoV-2 by FDA under an Emergency Use Authorization (EUA). This EUA will remain  in effect (meaning this test can be used) for the duration of the COVID-19 declaration under Section 564(b)(1) of the Act, 21 U.S.C.section 360bbb-3(b)(1), unless the authorization is terminated  or revoked sooner.       Influenza A by PCR NEGATIVE NEGATIVE Final   Influenza B by PCR NEGATIVE NEGATIVE Final    Comment: (NOTE) The Xpert Xpress SARS-CoV-2/FLU/RSV plus assay is intended as an aid in the diagnosis of influenza from Nasopharyngeal swab specimens and should not be used as a sole basis for treatment. Nasal washings and aspirates are unacceptable for Xpert Xpress SARS-CoV-2/FLU/RSV testing.  Fact Sheet for Patients: EntrepreneurPulse.com.au  Fact Sheet for Healthcare Providers: IncredibleEmployment.be  This test is not yet approved or cleared by the [place] FDA and has been authorized for detection and/or diagnosis of SARS-CoV-2 by FDA under an Emergency Use Authorization (EUA). This EUA will remain in effect (meaning this test can  be used) for the duration of the COVID-19 declaration under Section 564(b)(1) of the Act, 21 U.S.C. section 360bbb-3(b)(1), unless the authorization is terminated or revoked.  Performed at [facility], [place] [address] [place], [place] [address]   Culture, Urine     Status: Abnormal   Collection Time: 05/02/20  7:18 PM   Specimen: Urine, Catheterized  Result Value Ref Range Status   Specimen Description   Final    URINE, CATHETERIZED Performed at [facility] [address], [place], [place] [address]    Special Requests   Final    NONE Performed at [facility], [place] [address], [place], [place] [address]    Culture >=100,000 COLONIES/mL ESCHERICHIA COLI (A)  Final   Report Status 05/04/2020 FINAL  Final   Organism ID, Bacteria ESCHERICHIA COLI (A)  Final      Susceptibility   Escherichia coli - MIC*    AMPICILLIN <=2 SENSITIVE Sensitive     CEFAZOLIN <=4 SENSITIVE Sensitive     CEFEPIME <=0.12 SENSITIVE Sensitive     CEFTRIAXONE <=0.25 SENSITIVE Sensitive     CIPROFLOXACIN <=0.25 SENSITIVE Sensitive     GENTAMICIN <=1 SENSITIVE Sensitive     IMIPENEM <=0.25 SENSITIVE Sensitive     NITROFURANTOIN <=16 SENSITIVE Sensitive     TRIMETH/SULFA <=20 SENSITIVE Sensitive     AMPICILLIN/SULBACTAM <=2 SENSITIVE Sensitive     PIP/TAZO <=4 SENSITIVE Sensitive     * >=100,000 COLONIES/mL ESCHERICHIA COLI  Surgical pcr screen     Status: Abnormal   Collection Time: 05/04/20  5:37 AM   Specimen: Nasal Mucosa; Nasal Swab  Result Value Ref Range Status   MRSA, PCR NEGATIVE NEGATIVE Final   Staphylococcus aureus POSITIVE (A) NEGATIVE Final    Comment: (NOTE) The Xpert SA Assay (  FDA approved for NASAL specimens in patients 46 years of age and older), is one component of a comprehensive surveillance program. It is not intended to diagnose infection nor to guide or monitor treatment. Performed at [facility], [place] [address] [place], [place] [address]      RN Pressure Injury Documentation:     Estimated body mass index is 27.14 kg/m as calculated from the following:   Height as of this encounter: 5\' 3"  (1.6 m).   Weight as of this encounter: 69.5 kg.  Malnutrition Type:  Nutrition Problem: Inadequate oral intake Etiology: poor appetite   Malnutrition Characteristics:  Signs/Symptoms: per patient/family report   Nutrition Interventions:  Interventions: Ensure Enlive (each supplement provides 350kcal and 20 grams of protein)     Radiology Studies: DG Thoracic Spine 2 View  Result Date: 05/04/2020 CLINICAL DATA:  Surgery, elective. Additional history provided: Thoracic 9-thoracic 12 Percutaneous pedicle screws. Provided fluoroscopy time 4 minutes, 24 seconds (110.22 mGy). EXAM: THORACIC SPINE 2 VIEWS; DG C-ARM 1-60 MIN COMPARISON:  Thoracic spine MRI 05/01/2020. FINDINGS: Intraoperative fluoroscopic images of the lower thoracic spine are submitted, 2 images total. Redemonstrated T11 compression fracture. A posterior spinal fusion construct (utilizing pedicle screws and vertical interconnecting rods) spans the T9-T12 levels. No pedicle screw is present on the right at T11. IMPRESSION: Two intraoperative fluoroscopic images of the lower thoracic spine, as described. Electronically Signed   By: Kellie Simmering DO   On: 05/04/2020 12:30   DG C-Arm 1-60 Min  Result Date: 05/04/2020 CLINICAL DATA:  Surgery, elective. Additional history provided: Thoracic 9-thoracic 12 Percutaneous pedicle screws. Provided fluoroscopy time 4 minutes, 24 seconds (110.22 mGy). EXAM: THORACIC SPINE 2 VIEWS; DG C-ARM 1-60 MIN COMPARISON:  Thoracic spine MRI 05/01/2020. FINDINGS: Intraoperative fluoroscopic images of the lower thoracic spine are submitted, 2 images total. Redemonstrated T11 compression fracture. A posterior spinal fusion construct (utilizing pedicle screws and vertical interconnecting rods) spans the T9-T12 levels.  No pedicle screw is present on the right at T11. IMPRESSION: Two intraoperative fluoroscopic images of the lower thoracic spine, as described. Electronically Signed   By: Kellie Simmering DO   On: 05/04/2020 12:30   Scheduled Meds: . Chlorhexidine Gluconate Cloth  6 each Topical Q0600  . dorzolamide  1 drop Both Eyes BID  . feeding supplement  237 mL Oral BID BM  . latanoprost  1 drop Both Eyes QHS  . losartan  50 mg Oral Daily  . mupirocin ointment  1 application Nasal BID  . oxyCODONE  10 mg Oral Q12H  . senna-docusate  1 tablet Oral BID  . sodium chloride flush  3 mL Intravenous Q12H   Continuous Infusions: . sodium chloride Stopped (05/02/20 2103)  . sodium chloride    . sodium chloride 80 mL/hr at 05/05/20 1059  . cefTRIAXone (ROCEPHIN)  IV Stopped (05/04/20 2113)  . methocarbamol (ROBAXIN) IV Stopped (05/03/20 1523)    LOS: 4 days   Kerney Elbe, DO [facility] PAGER is on [place]  If 7PM-7AM, please contact night-coverage [email or web address]

## 2020-05-05 NOTE — Evaluation (Signed)
Physical Therapy Evaluation Patient Details Name: Danielle Thomas MRN: [id number] DOB: 09/05/1927 Today's Date: 05/05/2020   History of Present Illness  Danielle Thomas is a 86 y.o. female with medical history significant for atrial fibrillation on Coumadin, hypertension, restless leg syndrome who presents to the ED for evaluation of uncontrolled back pain.    She says that since she has been in the hospital she has been essentially bedbound due to difficulty ambulating secondary to continued back pain. Patient was recently admitted from 03/27/2020-03/29/2020 for multiple electrolyte abnormalities.  She was also found to have a T11 compression fracture which was managed with pain medication and TLSO brace.  She was discharged to SNF with plan for outpatient follow-up  Clinical Impression   Patient is s/p above surgery resulting in functional limitations due to the deficits listed below (see PT Problem List). Comes from home where she had been managing as independently as possible until pain limited her ability to take care of herself; Prior to November, pt was completely independent, reports was still driving; Presents to PT with decr functional mobility, decr activity tolerance, decr knowledge of precautions and use of brace; Even in the amount of pain she endorsed during therapy evals today, she was able to participate; she is highly independent, and given her PLOF, I believe it is worth considering CIR for psot-acute therapies;  Patient will benefit from skilled PT to increase their independence and safety with mobility to allow discharge to the venue listed below.       Follow Up Recommendations CIR;Other (comment) (must consider SNF if CIR is not an option)    Equipment Recommendations  Rolling walker with 5" wheels;3in1 (PT);Other (comment) (REgular RW will give more stable support than Rollator)    Recommendations for Other Services       Precautions / Restrictions  Precautions Precautions: Fall;Back Precaution Booklet Issued: No Precaution Comments: Newest order placed for LSO and in pt room. Pt previously wearing TLSO (orders also noted with this brace prior to LSO) Required Braces or Orthoses: Spinal Brace Spinal Brace: Lumbar corset;Applied in sitting position Restrictions Weight Bearing Restrictions: No      Mobility  Bed Mobility Overal bed mobility: Needs Assistance Bed Mobility: Rolling;Sidelying to Sit;Sit to Sidelying Rolling: Min guard Sidelying to sit: Mod assist     Sit to sidelying: Mod assist General bed mobility comments: Instructed pt in log rolling. On entry, pt initially twisting spine rolling side to side in attempts to alleviate pain. Educated in best way to roll to side at min guard, Mod A to sit EOB with assistance for trunk and manuevering of LE out of bed. Pt returning to supine again for LB dressing. Difficulty sitting EOB initially due to pain but then able to sit >10 min    Transfers Overall transfer level: Needs assistance Equipment used: Rolling walker (2 wheeled) Transfers: Sit to/from Stand Sit to Stand: Min assist         General transfer comment: Min A for power up using RW, difficulty with safety manuevering RW and hand placement. Cues to keep spine straight, able to take a couple of steps to recliner  Ambulation/Gait Ambulation/Gait assistance: Min assist Gait Distance (Feet): 4 Feet Assistive device: Rolling walker (2 wheeled) Gait Pattern/deviations: Step-through pattern     General Gait Details: took a few steps bed to recliner with RW; close guard and min asiss tfor steadying; cues to wait to sit until safely in front of recliner  Stairs  Wheelchair Mobility    Modified Rankin (Stroke Patients Only)       Balance Overall balance assessment: Needs assistance Sitting-balance support: Bilateral upper extremity supported;Feet supported Sitting balance-Leahy Scale:  Poor Sitting balance - Comments: reliant on B UE support sitting EOB due to pain   Standing balance support: Bilateral upper extremity supported;During functional activity Standing balance-Leahy Scale: Poor Standing balance comment: reliant on UE support for dynamic tasks                             Pertinent Vitals/Pain Pain Assessment: Faces Faces Pain Scale: Hurts whole lot Pain Location: low back with log rolling and upright sitting initially Pain Descriptors / Indicators: Grimacing;Guarding;Discomfort Pain Intervention(s): Monitored during session    Home Living Family/patient expects to be discharged to:: Private residence Living Arrangements: Alone Available Help at Discharge: Family;Available PRN/intermittently Type of Home: House Home Access: Stairs to enter   Entrance Stairs-Number of Steps: 1 Home Layout: Two level;Able to live on main level with bedroom/bathroom Home Equipment: Gilmer Mor - single point;Walker - 4 wheels Additional Comments: Commode downstairs in handicap height and one upstairs is not.    Prior Function Level of Independence: Needs assistance   Gait / Transfers Assistance Needed: Uses cane or Rollator as pain had gotten worse  ADL's / Homemaking Assistance Needed: Pt reports being previously Modified Independent to Independent for ADLs/IADLs in the home. Pt was still driving, grocery shopping        Hand Dominance   Dominant Hand: Right    Extremity/Trunk Assessment   Upper Extremity Assessment Upper Extremity Assessment: Defer to OT evaluation    Lower Extremity Assessment Lower Extremity Assessment: Generalized weakness    Cervical / Trunk Assessment Cervical / Trunk Assessment: Other exceptions Cervical / Trunk Exceptions: s/p screw placement  Communication   Communication: HOH  Cognition Arousal/Alertness: Awake/alert Behavior During Therapy: WFL for tasks assessed/performed Overall Cognitive Status: Impaired/Different  from baseline Area of Impairment: Attention;Memory;Following commands;Safety/judgement;Awareness;Problem solving                   Current Attention Level: Selective Memory: Decreased recall of precautions Following Commands: Follows one step commands with increased time Safety/Judgement: Decreased awareness of safety;Decreased awareness of deficits Awareness: Emergent Problem Solving: Difficulty sequencing;Requires verbal cues;Requires tactile cues General Comments: Pt overall A&Ox4, a bit impulsive with movements secondary to pain and poor recall of back precautions. Follows commands with cues      General Comments General comments (skin integrity, edema, etc.): With initial standing, pt reporting "Im going to faint". BP readings normal throughout. Noted fx/surgical site at thoracic region, but newest orders for LSO brace and present in room. Pt reports surgeon planned to use brace that did not "go up to my neck." based on incision site, placed LSO brace a little higher to provide support at surgical site.    Exercises     Assessment/Plan    PT Assessment Patient needs continued PT services  PT Problem List Decreased strength;Decreased activity tolerance;Decreased balance;Decreased mobility;Decreased coordination;Decreased cognition;Decreased knowledge of use of DME;Decreased safety awareness;Decreased knowledge of precautions;Pain       PT Treatment Interventions DME instruction;Gait training;Stair training;Functional mobility training;Therapeutic activities;Therapeutic exercise;Balance training;Neuromuscular re-education;Cognitive remediation;Patient/family education    PT Goals (Current goals can be found in the Care Plan section)  Acute Rehab PT Goals Patient Stated Goal: pain control PT Goal Formulation: With patient Time For Goal Achievement: 05/19/20 Potential to Achieve Goals: Fair  Frequency Min 4X/week   Barriers to discharge        Co-evaluation  PT/OT/SLP Co-Evaluation/Treatment: Yes Reason for Co-Treatment: For patient/therapist safety;Other (comment) (per notes, pt reports being bedbound prior to admission due to pain) PT goals addressed during session: Mobility/safety with mobility OT goals addressed during session: ADL's and self-care;Other (comment) (back precautions)       AM-PAC PT "6 Clicks" Mobility  Outcome Measure Help needed turning from your back to your side while in a flat bed without using bedrails?: A Little Help needed moving from lying on your back to sitting on the side of a flat bed without using bedrails?: A Lot Help needed moving to and from a bed to a chair (including a wheelchair)?: A Lot Help needed standing up from a chair using your arms (e.g., wheelchair or bedside chair)?: A Lot Help needed to walk in hospital room?: A Lot Help needed climbing 3-5 steps with a railing? : A Lot 6 Click Score: 13    End of Session Equipment Utilized During Treatment: Gait belt;Back brace Activity Tolerance: Patient tolerated treatment well Patient left: in chair;with call bell/phone within reach;with chair alarm set Nurse Communication: Mobility status PT Visit Diagnosis: Unsteadiness on feet (R26.81);Other abnormalities of gait and mobility (R26.89);Pain Pain - part of body:  (midline back pain)    Time: [id number] PT Time Calculation (min) (ACUTE ONLY): 27 min   Charges:   PT Evaluation $PT Eval Moderate Complexity: 1 Mod          Roney [place], [place]  Acute Rehabilitation Services Pager [phone] Office [phone]   Colletta [place] 05/05/2020, 11:47 AM

## 2020-05-05 NOTE — Evaluation (Signed)
Occupational Therapy Evaluation Patient Details Name: Danielle Thomas MRN: [id number] DOB: [birth year] Today's Date: 05/05/2020    History of Present Illness Danielle Thomas is a 86 y.o. female with medical history significant for atrial fibrillation on Coumadin, hypertension, restless leg syndrome who presents to the ED for evaluation of uncontrolled back pain.    She says that since she has been in the hospital she has been essentially bedbound due to difficulty ambulating secondary to continued back pain. Patient was recently admitted from 03/27/2020-03/29/2020 for multiple electrolyte abnormalities.  She was also found to have a T11 compression fracture which was managed with pain medication and TLSO brace.  She was discharged to SNF with plan for outpatient follow-up   Clinical Impression   PTA, pt lives alone and reports typically Independent with ADLs, IADLs and mobility though has been using cane or Rollator more recently due to increasing back pain. Pt limited by pain and deficits in strength, dynamic standing balance and endurance. Pt requires frequent cues to maintain spinal precautions. Pt overall Mod A for bed mobility with cues for log rolling. Pt requires Min A for UB/LB ADLs due to deficits. Pt able to demo some LB dressing tasks bed level while maintaining spinal precautions. With initial stand, pt reports feeling faint (BP readings below). Pt noted with LSO brace orders and in room, applied brace to support around incision area. Pt overall Min A for sit to stand and taking steps to the recliner. Based on pt's high PLOF and pending pain improvement, recommend CIR to intensive therapies to maximize safety in ADLs, IADLs and mobility. Plan to provide back precaution handout and assess carryover during ADL tasks.   Sitting 119/68 (83) 102bpm  Standing 124/104 (111) 177bpm  After transfer 123/77 (89) 77bpm      Follow Up Recommendations  CIR;Supervision/Assistance - 24 hour (SNF if  unable to go to CIR)    Equipment Recommendations  3 in 1 bedside commode;Other (comment) (RW)    Recommendations for Other Services Rehab consult     Precautions / Restrictions Precautions Precautions: Fall;Back Precaution Booklet Issued: No Precaution Comments: Newest order placed for LSO and in pt room. Pt previously wearing TLSO (orders also noted with this brace prior to LSO) Required Braces or Orthoses: Spinal Brace Spinal Brace: Lumbar corset Restrictions Weight Bearing Restrictions: No      Mobility Bed Mobility Overal bed mobility: Needs Assistance Bed Mobility: Rolling;Sidelying to Sit;Sit to Sidelying Rolling: Min guard Sidelying to sit: Mod assist     Sit to sidelying: Mod assist General bed mobility comments: Instructed pt in log rolling. On entry, pt initially twisting spine rolling side to side in attempts to alleviate pain. Educated in best way to roll to side at min guard, Mod A to sit EOB with assistance for trunk and manuevering of LE out of bed. Pt returning to supine again for LB dressing. Difficulty sitting EOB initially due to pain but then able to sit >10 min    Transfers Overall transfer level: Needs assistance Equipment used: Rolling walker (2 wheeled) Transfers: Sit to/from Stand Sit to Stand: Min assist         General transfer comment: Min A for power up using RW, difficulty with safety manuevering RW and hand placement. Cues to keep spine straight, able to take a couple of steps to recliner    Balance Overall balance assessment: Needs assistance Sitting-balance support: Bilateral upper extremity supported;Feet supported Sitting balance-Leahy Scale: Poor Sitting balance - Comments: reliant on  B UE support sitting EOB due to pain   Standing balance support: Bilateral upper extremity supported;During functional activity Standing balance-Leahy Scale: Poor Standing balance comment: reliant on UE support for dynamic tasks                            ADL either performed or assessed with clinical judgement   ADL Overall ADL's : Needs assistance/impaired Eating/Feeding: Set up;Sitting   Grooming: Set up;Sitting   Upper Body Bathing: Minimal assistance;Sitting   Lower Body Bathing: Minimal assistance;Sit to/from stand   Upper Body Dressing : Set up;Sitting Upper Body Dressing Details (indicate cue type and reason): Setup to don clean hospital gown Lower Body Dressing: Minimal assistance;Sit to/from stand Lower Body Dressing Details (indicate cue type and reason): Attempted to guide pt in donning socks sitting EOB. However, pt with too much pain. Pt able to demo donning socks bed level bringing feet to self. Toilet Transfer: Minimal Systems analyst Details (indicate cue type and reason): Min A for manuevering of RW and cues for safety/spinal precautions Toileting- Clothing Manipulation and Hygiene: Minimal assistance;Sit to/from stand       Functional mobility during ADLs: Minimal assistance;Rolling walker;Cueing for sequencing;Cueing for safety General ADL Comments: Pt limited by pain, poor recall of spinal precautions.     Vision Patient Visual Report: No change from baseline Vision Assessment?: No apparent visual deficits     Perception     Praxis      Pertinent Vitals/Pain Pain Assessment: Faces Faces Pain Scale: Hurts whole lot Pain Location: low back with log rolling and upright sitting initially Pain Descriptors / Indicators: Grimacing;Guarding;Discomfort Pain Intervention(s): Monitored during session;Repositioned     Hand Dominance Right   Extremity/Trunk Assessment Upper Extremity Assessment Upper Extremity Assessment: Overall WFL for tasks assessed   Lower Extremity Assessment Lower Extremity Assessment: Defer to PT evaluation   Cervical / Trunk Assessment Cervical / Trunk Assessment: Other exceptions Cervical / Trunk Exceptions: s/p screw placement    Communication Communication Communication: HOH   Cognition Arousal/Alertness: Awake/alert Behavior During Therapy: WFL for tasks assessed/performed Overall Cognitive Status: Impaired/Different from baseline Area of Impairment: Attention;Memory;Following commands;Safety/judgement;Awareness;Problem solving                   Current Attention Level: Selective Memory: Decreased recall of precautions Following Commands: Follows one step commands with increased time Safety/Judgement: Decreased awareness of safety;Decreased awareness of deficits Awareness: Emergent Problem Solving: Difficulty sequencing;Requires verbal cues;Requires tactile cues General Comments: Pt overall A&Ox4, a bit impulsive with movements secondary to pain and poor recall of back precautions. Follows commands with cues   General Comments  With initial standing, pt reporting "Im going to faint". BP readings normal throughout. Noted fx/surgical site at thoracic region, but newest orders for LSO brace and present in room. Pt reports surgeon planned to use brace that did not "go up to my neck." based on incision site, placed LSO brace a little higher to provide support at surgical site.    Exercises     Shoulder Instructions      Home Living Family/patient expects to be discharged to:: Private residence Living Arrangements: Alone Available Help at Discharge: Family;Available PRN/intermittently Type of Home: House Home Access: Stairs to enter Entrance Stairs-Number of Steps: 1   Home Layout: Two level;Able to live on main level with bedroom/bathroom Alternate Level Stairs-Number of Steps: 13 Alternate Level Stairs-Rails: Right Bathroom Shower/Tub: Tub/shower unit;Curtain   Bathroom Toilet: Handicapped height  Home Equipment: Kasandra Knudsen - single point;Walker - 4 wheels   Additional Comments: Commode downstairs in handicap height and one upstairs is not.      Prior Functioning/Environment Level of  Independence: Needs assistance  Gait / Transfers Assistance Needed: Uses cane or Rollator as pain had gotten worse ADL's / Homemaking Assistance Needed: Pt reports being previously Modified Independent to Independent for ADLs/IADLs in the home. Pt was still driving, grocery shopping            OT Problem List: Decreased strength;Decreased activity tolerance;Impaired balance (sitting and/or standing);Decreased safety awareness;Decreased knowledge of use of DME or AE;Decreased knowledge of precautions;Pain      OT Treatment/Interventions: Self-care/ADL training;Therapeutic exercise;DME and/or AE instruction;Therapeutic activities;Patient/family education;Balance training    OT Goals(Current goals can be found in the care plan section) Acute Rehab OT Goals Patient Stated Goal: pain control OT Goal Formulation: With patient Time For Goal Achievement: 05/19/20 Potential to Achieve Goals: Good ADL Goals Pt Will Perform Grooming: with modified independence;standing Pt Will Perform Lower Body Bathing: with modified independence;sit to/from stand;with adaptive equipment Pt Will Perform Lower Body Dressing: with modified independence;with adaptive equipment;sit to/from stand;bed level Pt Will Transfer to Toilet: with modified independence;ambulating;bedside commode Pt Will Perform Toileting - Clothing Manipulation and hygiene: with modified independence;sit to/from stand Additional ADL Goal #1: Pt to verbalize 3/3 back precautions Independently  OT Frequency: Min 2X/week   Barriers to D/C:            Co-evaluation PT/OT/SLP Co-Evaluation/Treatment: Yes Reason for Co-Treatment: For patient/therapist safety;To address functional/ADL transfers;Other (comment) (per notes, pt reports being bedbound prior to admission due to pain)   OT goals addressed during session: ADL's and self-care;Other (comment) (back precautions)      AM-PAC OT "6 Clicks" Daily Activity     Outcome Measure Help from  another person eating meals?: A Little Help from another person taking care of personal grooming?: A Little Help from another person toileting, which includes using toliet, bedpan, or urinal?: A Little Help from another person bathing (including washing, rinsing, drying)?: A Little Help from another person to put on and taking off regular upper body clothing?: A Little Help from another person to put on and taking off regular lower body clothing?: A Little 6 Click Score: 18   End of Session Equipment Utilized During Treatment: Gait belt;Rolling walker;Back brace Nurse Communication: Mobility status;Patient requests pain meds;Precautions  Activity Tolerance: Patient tolerated treatment well;Patient limited by pain Patient left: in chair;with call bell/phone within reach;with chair alarm set  OT Visit Diagnosis: Unsteadiness on feet (R26.81);Other abnormalities of gait and mobility (R26.89);Muscle weakness (generalized) (M62.81);Pain Pain - part of body:  (back)                Time: [id number] OT Time Calculation (min): 38 min Charges:  OT General Charges $OT Visit: 1 Visit OT Evaluation $OT Eval Moderate Complexity: 1 Mod OT Treatments $Therapeutic Activity: 8-22 mins  Layla Maw, OTR/L  Layla Maw 05/05/2020, 10:45 AM

## 2020-05-05 NOTE — Progress Notes (Signed)
Inpatient Rehabilitation Admissions Coordinator   Inpatient rehab consult received. I will follow up with patient tomorrow for assessment.  Ottie Glazier, RN, MSN Rehab Admissions Coordinator [phone] 05/05/2020 6:21 PM

## 2020-05-06 DIAGNOSIS — I10 Essential (primary) hypertension: Secondary | ICD-10-CM | POA: Diagnosis not present

## 2020-05-06 DIAGNOSIS — B9620 Unspecified Escherichia coli [E. coli] as the cause of diseases classified elsewhere: Secondary | ICD-10-CM

## 2020-05-06 DIAGNOSIS — E876 Hypokalemia: Secondary | ICD-10-CM | POA: Diagnosis not present

## 2020-05-06 DIAGNOSIS — S22080A Wedge compression fracture of T11-T12 vertebra, initial encounter for closed fracture: Secondary | ICD-10-CM | POA: Diagnosis not present

## 2020-05-06 DIAGNOSIS — N390 Urinary tract infection, site not specified: Secondary | ICD-10-CM

## 2020-05-06 DIAGNOSIS — I480 Paroxysmal atrial fibrillation: Secondary | ICD-10-CM | POA: Diagnosis not present

## 2020-05-06 LAB — COMPREHENSIVE METABOLIC PANEL
ALT: 26 U/L (ref 0–44)
AST: 32 U/L (ref 15–41)
Albumin: 2.6 g/dL — ABNORMAL LOW (ref 3.5–5.0)
Alkaline Phosphatase: 59 U/L (ref 38–126)
Anion gap: 10 (ref 5–15)
BUN: 20 mg/dL (ref 8–23)
CO2: 25 mmol/L (ref 22–32)
Calcium: 8.2 mg/dL — ABNORMAL LOW (ref 8.9–10.3)
Chloride: 99 mmol/L (ref 98–111)
Creatinine, Ser: 0.83 mg/dL (ref 0.44–1.00)
GFR, Estimated: >60 mL/min (ref >60)
Glucose, Bld: 117 mg/dL — ABNORMAL HIGH (ref 70–99)
Potassium: 3.9 mmol/L (ref 3.5–5.1)
Sodium: 134 mmol/L — ABNORMAL LOW (ref 135–145)
Total Bilirubin: 1 mg/dL (ref 0.3–1.2)
Total Protein: 5.1 g/dL — ABNORMAL LOW (ref 6.5–8.1)

## 2020-05-06 LAB — CBC WITH DIFFERENTIAL/PLATELET
Abs Immature Granulocytes: 0.08 10*3/uL — ABNORMAL HIGH (ref 0.00–0.07)
Basophils Absolute: 0 10*3/uL (ref 0.0–0.1)
Basophils Relative: 0 %
Eosinophils Absolute: 0 10*3/uL (ref 0.0–0.5)
Eosinophils Relative: 0 %
HCT: 38.3 % (ref 36.0–46.0)
Hemoglobin: 13 g/dL (ref 12.0–15.0)
Immature Granulocytes: 1 %
Lymphocytes Relative: 6 %
Lymphs Abs: 0.8 10*3/uL (ref 0.7–4.0)
MCH: 30.4 pg (ref 26.0–34.0)
MCHC: 33.9 g/dL (ref 30.0–36.0)
MCV: 89.5 fL (ref 80.0–100.0)
Monocytes Absolute: 1.4 10*3/uL — ABNORMAL HIGH (ref 0.1–1.0)
Monocytes Relative: 10 %
Neutro Abs: 11.5 10*3/uL — ABNORMAL HIGH (ref 1.7–7.7)
Neutrophils Relative %: 83 %
Platelets: 155 10*3/uL (ref 150–400)
RBC: 4.28 MIL/uL (ref 3.87–5.11)
RDW: 16.1 % — ABNORMAL HIGH (ref 11.5–15.5)
WBC: 13.8 10*3/uL — ABNORMAL HIGH (ref 4.0–10.5)
nRBC: 0 % (ref 0.0–0.2)

## 2020-05-06 LAB — PROTIME-INR
INR: 1.2 (ref 0.8–1.2)
Prothrombin Time: 15.2 seconds (ref 11.4–15.2)

## 2020-05-06 LAB — PHOSPHORUS: Phosphorus: 2.6 mg/dL (ref 2.5–4.6)

## 2020-05-06 LAB — MAGNESIUM: Magnesium: 1.7 mg/dL (ref 1.7–2.4)

## 2020-05-06 MED ORDER — MAGNESIUM SULFATE 2 GM/50ML IV SOLN
2.0000 g | Freq: Once | INTRAVENOUS | Status: AC
  Administered 2020-05-06: 13:00:00 2 g via INTRAVENOUS
  Filled 2020-05-06 (×2): qty 50

## 2020-05-06 MED ORDER — DIAZEPAM 5 MG PO TABS
5.0000 mg | ORAL_TABLET | Freq: Four times a day (QID) | ORAL | Status: DC | PRN
  Administered 2020-05-06 – 2020-05-13 (×3): 5 mg via ORAL
  Filled 2020-05-06 (×4): qty 1

## 2020-05-06 MED ORDER — WARFARIN SODIUM 2 MG PO TABS
2.0000 mg | ORAL_TABLET | Freq: Once | ORAL | Status: AC
  Administered 2020-05-06: 17:00:00 2 mg via ORAL
  Filled 2020-05-06: qty 1

## 2020-05-06 MED ORDER — CEPHALEXIN 250 MG PO CAPS
250.0000 mg | ORAL_CAPSULE | Freq: Four times a day (QID) | ORAL | Status: AC
  Administered 2020-05-06 – 2020-05-09 (×12): 250 mg via ORAL
  Filled 2020-05-06 (×12): qty 1

## 2020-05-06 MED ORDER — WARFARIN - PHARMACIST DOSING INPATIENT: Freq: Every day | Status: DC

## 2020-05-06 NOTE — Progress Notes (Signed)
Inpatient Rehabilitation Admissions Coordinator   I met at bedside with patient and her daughter for rehab assessment. We discussed goals and expectations of a possible Cir admit for which they prefer rather than SNF. Recently at [facility] SN[facility]. I will not have a bed at CIR into next week, but I will begin insurance authorization with [facility] for possible admit next week. CIR admit penidng insurance approval.   Danne Baxter, RN, MSN Rehab Admissions Coordinator [phone] 05/06/2020 12:47 PM '

## 2020-05-06 NOTE — Progress Notes (Signed)
PROGRESS NOTE    Danielle Thomas  ZOX:[id number] DOB: 1928-03-27 DOA: 05/01/2020 PCP: Gweneth Dimitri, MD  Brief Narrative:  HPI per Dr. Darreld Mclean on 05/01/20 Danielle Thomas is a 86 y.o. female with medical history significant for atrial fibrillation on Coumadin, hypertension, restless leg syndrome who presents to the ED for evaluation of uncontrolled back pain.  Patient was recently admitted from 03/27/2020-03/29/2020 for multiple electrolyte abnormalities.  She was also found to have a T11 compression fracture which was managed with pain medication and TLSO brace.  She was discharged to SNF with plan for outpatient follow-up.  Patient states she was in SNF for 2 weeks before returning home.  She says that since she has been in the hospital she has been essentially bedbound due to difficulty ambulating secondary to continued back pain.  She says previously she would ambulate with the use of a walker.  She currently lives alone.  She otherwise denies any focal deficits or new weakness in her extremities.  ED Course:  Initial vitals showed BP 180/92, pulse 83, RR 16, temp 97.8 F, SPO2 97% on room air.  Labs show sodium 134, potassium 2.9, bicarb 30, BUN 22, creatinine 0.90, serum glucose 130, WBC 11.3, hemoglobin 16.9, platelets 232,000, INR 2.8.  SARS-CoV-2 PCR panel is ordered and pending.  Right rib x-rays negative for displaced rib fracture or other acute abnormality of the right ribs.  No pneumothorax or pleural effusion seen.  MRI thoracic and lumbar spine without contrast showed progressed T11 compression fracture with loss of height increased from 20% to 40%.  New retropulsion of bone resulting in mild spinal stenosis with mild cord mass-effect but no spinal cord signal abnormality also noted.  Acute to subacute posterior left rib fractures 9 through 11 are seen.  No acute osseous abnormality in the lumbar spine seen.  Patient was given IV morphine 6 mg and 4 mg, IV  Dilaudid 0.5 mg, K 40 mEq orally.  EDP discussed with on-call neurosurgery, Dr. Franky Macho who recommended medical admission for further neurosurgery evaluation and potential kyphoplasty after the weekend.  The hospitalist service was consulted to admit for further evaluation management.  **Interim History  Patient underwent surgical intervention on 05/04/2020 by Dr. Franky Macho.  She is postoperative day 2 and still complains of some moderate back pain.  She states that her appetite is diminished.  Will need PT OT to evaluate for safe discharge disposition and they evaluated today and they are recommending CIR and 24-hour supervision and SNF if she is unable to go to CIR.  Will consult CIR for evaluation. Neurosurgery recommending continuing brace while ambulating. After discussion with Neurosurgery Dr. Franky Macho recommends resuming Anticoagulation today (48 hours after surgery). We will change Abx to po and stop after 7 days.   Assessment & Plan:   Principal Problem:   Compression fracture of T11 vertebra (HCC) Active Problems:   Atrial fibrillation (HCC)   Essential hypertension, benign   Hypokalemia   Multiple fractures of ribs, left side, initial encounter for closed fracture   Closed wedge compression fracture of T11 vertebra (HCC)  Uncontrolled back pain due to progressed T11 compression fracture s/p thoracic 10-11 arthrodesis, allograft morselized, BMP, thoracic 9 thoracic 12 percutaneous pedicle screws postoperative day 2 -Continue TLSO brace, pain control.   -Patient has a known T11 compression fracture and following with orthopedics as an outpatient.   -MRI done here showed progression of the compression fracture, increased loss of height. It also showed new retropulsion of bone  resulting in mild spinal stenosis with mild cord mass effect but no spinal cord signal abnormality  -Dr. Christella Noa, Neurosurgery was consulted and planning on percutaneous pedicle screws and a T10 to T12 arthrodesis  today and she is POD1. -Consult  PT/OT after procedure. -Added Robaxin and will continue with IV every 6 hours as needed. -Pain control with cyclobenzaprine 10 mg p.o. 3 times daily as needed muscle spasms, hydrocodone-acetaminophen 1-2 tabs p.o. every 4 hours as needed for moderate pain, methocarbamol 500 g IV every 6 hours as needed for muscle spasms as well, oxycodone 10 mg every 12 hours scheduled -Continue with bowel regimen -Patient continues to have moderate back pain but neurosurgery feels that she is doing well and they are recommending continuing the brace while she is ambulating  Atrial Fibrillation -On Coumadin as an outpatient.  INR 2.8 and trended up to 4.0 and is now 1.2 today.   -Hold Coumadin for now pending potential kyphoplasty.  On Coumadin for anticoagulation.   -Since INR was 4 today,neurosugery ordered oral Vitamin K for surgery tomorrow.  After vitamin K INR is trended down to 1.2 and PT is 15.2 -We will need to resume anticoagulation when okayed by neurosurgery and Dr. Christella Noa recommending resuming Anticoagulation today   Acute to subacute left 9-11 rib fractures: -Seen on MRI.  Continue pain control as above and supportive Care -Incentive spirometer. -She may have fallen at home -Continue to Monitor carefully   Hypertension -Continue losartan.  HCTZ was on hold due to hypokalemia but was resumed yesterday at 10 AM Been she is continuing hydrochlorothiazide 12.5 g daily and losartan 50 mg daily -Continue to monitor blood pressures per protocol -Last blood pressure reading was elevated at 124/54 -We will add hydralazine 10 mg IV every 6 for systolic blood pressure greater than 0000000 or diastolic blood pressure and 90  Hypokalemia -Improved and potassium is now 3.9 -Magnesium level still pending today as well -IVF is now discontinued  -Continue monitor and replete as necessary -Repeat CMP in a.m.  Hyponatremia -Mild -Patient's sodium had dropped from 137 ->  133 -> 133 -continue IV fluid hydration as above -Continue to monitor and trend and repeat CMP in a.m.  Restless leg syndrome: -Continue Ropinirole 0.25 mg p.o. twice daily as needed for restless legs.  Glaucoma -Continue Dorzolamide 1 drop both eyes twice daily as well as latanoprost 1 drop in both eyes nightly  Leukocytosis in the setting of fall and E. coli UTI, poA -She is Afebrile; WBC went from 11.3 -> 12.0 -> 10.8 and down to 10.0 but is now 14.5 and in the setting of her surgical intervention; Now improving and is 13.8 today  -U/A Showed turbid appearance with amber color urine, large hemoglobin, moderate leukocytes, negative nitrites, many bacteria, 0-5 non-squamous epithelial cells, greater than 50 RBCs per high-power field, 0-5 squamous epithelial cells, and greater than 50 WBCs  -Urine culture was obtained and was positive for E. 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  -No signs of bowel ischemia. -Need ambulatory referral to vascular surgery   FTT -Patient report was previously independent prior to hospitalization in November -However she is not able to get out of bed due to pain currently -Nutritionist consulted and recommending Ensure Enlive po BID -Will need PT OT eval once pain  improves and she is now being recommended for CIR  DVT prophylaxis: SCDs; Will resume Anticoagulation today  Code Status: Partial  Family Communication: Discussed with family at bedside Disposition Plan: PT and OT evaluation recommending CIR vs SNF and clearance by neurosurgery  Status is: Inpatient  Remains inpatient appropriate because:Unsafe d/c plan, IV treatments appropriate due to intensity of illness or inability to take PO and Inpatient level of care appropriate due to severity of  illness   Dispo: The patient is from: Home              Anticipated d/c is to: SNF              Anticipated d/c date is: 2 days              Patient currently is not medically stable to d/c.  Consultants:   Neurosurgery Dr. Christella Noa   Procedures:  PROCEDURE by Dr. Ashok Pall:  Procedure(s): thoracic 10-11 arthrodesis, allograft morsels, bmp Thoracic nine to Thoracic twelve percutaneous pedicle screws For segmental pedicle screw fixation  Antimicrobials:  Anti-infectives (From admission, onward)   Start     Dose/Rate Route Frequency Ordered Stop   05/06/20 2100  cephALEXin (KEFLEX) capsule 250 mg        250 mg Oral Every 6 hours 05/06/20 1350 05/09/20 2359   05/04/20 0615  ceFAZolin (ANCEF) IVPB 2g/100 mL premix        2 g 200 mL/hr over 30 Minutes Intravenous On call to O.R. 05/04/20 0517 05/04/20 1008   05/02/20 2200  cefTRIAXone (ROCEPHIN) 1 g in sodium chloride 0.9 % 100 mL IVPB  Status:  Discontinued        1 g 200 mL/hr over 30 Minutes Intravenous Every 24 hours 05/02/20 1919 05/06/20 1350   05/02/20 2015  cefTRIAXone (ROCEPHIN) 1 g in sodium chloride 0.9 % 100 mL IVPB  Status:  Discontinued        1 g 200 mL/hr over 30 Minutes Intravenous Every 24 hours 05/02/20 1918 05/02/20 1919        Subjective: Seen and examined at bedside   Objective: Vitals:   05/05/20 0723 05/05/20 1500 05/06/20 0424 05/06/20 0741  BP: (!) 144/68 (!) 129/98 (!) 156/85 (!) 124/54  Pulse: 86 (!) 102 93 99  Resp: 16 16 18 14   Temp: 98 F (36.7 C) 98.3 F (36.8 C) 98.4 F (36.9 C) (!) 97.5 F (36.4 C)  TempSrc: Oral Oral Oral Oral  SpO2: 96% 100% 93% 96%  Weight:      Height:        Intake/Output Summary (Last 24 hours) at 05/06/2020 1426 Last data filed at 05/05/2020 1700 Gross per 24 hour  Intake 599.17 ml  Output --  Net 599.17 ml   Filed Weights   05/01/20 2044 05/02/20 0500 05/03/20 0529  Weight: 68 kg 68.5 kg 69.5 kg   Examination: Physical Exam:  Constitutional:  WN/WD obese elderly Caucasian female currently in NAD but laying on her left side Eyes: Lids and conjunctivae normal, sclerae anicteric  ENMT: External Ears, Nose appear normal. Grossly normal hearing. Neck: Appears normal, supple, no cervical masses, normal ROM, no appreciable thyromegaly; no JVD Respiratory: Diminished to auscultation bilaterally, no wheezing, rales, rhonchi or crackles. Normal respiratory effort and patient is not tachypenic. No accessory muscle use. Unlabored breathing  Cardiovascular: RRR, no murmurs / rubs / gallops. S1 and S2 auscultated. Trace extremity edema.  Abdomen: Soft, non-tender, non-distended. Bowel sounds positive.  GU: Deferred. Musculoskeletal: No  clubbing / cyanosis of digits/nails. No joint deformity upper and lower extremities.  Skin: No rashes, lesions, ulcers on a limited skin evaluation. No induration; Warm and dry.  Neurologic: CN 2-12 grossly intact with no focal deficits. Romberg sign and cerebellar reflexes not assessed.  Psychiatric: Normal judgment and insight. Alert and oriented x 3. Not as anxious today and has a normal mood and appropriate affect.   Data Reviewed: I have personally reviewed following labs and imaging studies  CBC: Recent Labs  Lab 05/01/20 1536 05/02/20 0236 05/03/20 0201 05/04/20 0550 05/05/20 0212 05/06/20 0137  WBC 11.3* 12.0* 10.8* 10.0 14.5* 13.8*  NEUTROABS 9.2*  --  8.3* 7.3 12.7* 11.5*  HGB 16.9* 15.4* 16.2* 16.6* 14.5 13.0  HCT 49.8* 47.1* 49.3* 51.8* 42.3 38.3  MCV 88.6 91.5 92.3 92.3 89.6 89.5  PLT 232 222 231 198 216 [id number]   Basic Metabolic Panel: Recent Labs  Lab 05/01/20 2131 05/02/20 0236 05/03/20 0201 05/04/20 0550 05/05/20 0212 05/06/20 0137  NA  --  135 134* 137 133* 134*  K  --  4.5 3.9 4.1 5.1 3.9  CL  --  96* 94* 96* 99 99  CO2  --  29 30 30 23 25   GLUCOSE  --  100* 105* 98 137* 117*  BUN  --  23 28* 23 19 20   CREATININE  --  0.94 0.93 1.01* 0.87 0.83  CALCIUM  --  8.7* 8.8* 8.9 8.3*  8.2*  MG 1.9  --   --  1.9 1.7 1.7  PHOS  --   --   --  3.3 2.8 2.6   GFR: Estimated Creatinine Clearance: 42.1 mL/min (by C-G formula based on SCr of 0.83 mg/dL). Liver Function Tests: Recent Labs  Lab 05/04/20 0550 05/05/20 0212 05/06/20 0137  AST 28 41 32  ALT 35 30 26  ALKPHOS 74 67 59  BILITOT 0.8 1.1 1.0  PROT 5.8* 5.2* 5.1*  ALBUMIN 3.0* 2.8* 2.6*   No results for input(s): LIPASE, AMYLASE in the last 168 hours. No results for input(s): AMMONIA in the last 168 hours. Coagulation Profile: Recent Labs  Lab 05/03/20 0201 05/04/20 0303 05/04/20 0840 05/05/20 0212 05/06/20 0137  INR 4.0* 2.1* 1.5* 1.2 1.2   Cardiac Enzymes: No results for input(s): CKTOTAL, CKMB, CKMBINDEX, TROPONINI in the last 168 hours. BNP (last 3 results) No results for input(s): PROBNP in the last 8760 hours. HbA1C: No results for input(s): HGBA1C in the last 72 hours. CBG: No results for input(s): GLUCAP in the last 168 hours. Lipid Profile: No results for input(s): CHOL, HDL, LDLCALC, TRIG, CHOLHDL, LDLDIRECT in the last 72 hours. Thyroid Function Tests: No results for input(s): TSH, T4TOTAL, FREET4, T3FREE, THYROIDAB in the last 72 hours. Anemia Panel: No results for input(s): VITAMINB12, FOLATE, FERRITIN, TIBC, IRON, RETICCTPCT in the last 72 hours. Sepsis Labs: No results for input(s): PROCALCITON, LATICACIDVEN in the last 168 hours.  Recent Results (from the past 240 hour(s))  Resp Panel by RT-PCR (Flu A&B, Covid) Nasopharyngeal Swab     Status: None   Collection Time: 05/01/20  6:23 PM   Specimen: Nasopharyngeal Swab; Nasopharyngeal(NP) swabs in vial transport medium  Result Value Ref Range Status   SARS Coronavirus 2 by RT PCR NEGATIVE NEGATIVE Final    Comment: (NOTE) SARS-CoV-2 target nucleic acids are NOT DETECTED.  The SARS-CoV-2 RNA is generally detectable in upper respiratory specimens during the acute phase of infection. The lowest concentration of SARS-CoV-2 viral  copies this assay can  detect is 138 copies/mL. A negative result does not preclude SARS-Cov-2 infection and should not be used as the sole basis for treatment or other patient management decisions. A negative result may occur with  improper specimen collection/handling, submission of specimen other than nasopharyngeal swab, presence of viral mutation(s) within the areas targeted by this assay, and inadequate number of viral copies(<138 copies/mL). A negative result must be combined with clinical observations, patient history, and epidemiological information. The expected result is Negative.  Fact Sheet for Patients:  EntrepreneurPulse.com.au  Fact Sheet for Healthcare Providers:  IncredibleEmployment.be  This test is no t yet approved or cleared by the [place] FDA and  has been authorized for detection and/or diagnosis of SARS-CoV-2 by FDA under an Emergency Use Authorization (EUA). This EUA will remain  in effect (meaning this test can be used) for the duration of the COVID-19 declaration under Section 564(b)(1) of the Act, 21 U.S.C.section 360bbb-3(b)(1), unless the authorization is terminated  or revoked sooner.       Influenza A by PCR NEGATIVE NEGATIVE Final   Influenza B by PCR NEGATIVE NEGATIVE Final    Comment: (NOTE) The Xpert Xpress SARS-CoV-2/FLU/RSV plus assay is intended as an aid in the diagnosis of influenza from Nasopharyngeal swab specimens and should not be used as a sole basis for treatment. Nasal washings and aspirates are unacceptable for Xpert Xpress SARS-CoV-2/FLU/RSV testing.  Fact Sheet for Patients: EntrepreneurPulse.com.au  Fact Sheet for Healthcare Providers: IncredibleEmployment.be  This test is not yet approved or cleared by the [place] FDA and has been authorized for detection and/or diagnosis of SARS-CoV-2 by FDA under an Emergency Use Authorization (EUA). This  EUA will remain in effect (meaning this test can be used) for the duration of the COVID-19 declaration under Section 564(b)(1) of the Act, 21 U.S.C. section 360bbb-3(b)(1), unless the authorization is terminated or revoked.  Performed at [facility], [place] [address], [place], [place] [address]   Culture, Urine     Status: Abnormal   Collection Time: 05/02/20  7:18 PM   Specimen: Urine, Catheterized  Result Value Ref Range Status   Specimen Description   Final    URINE, CATHETERIZED Performed at [place] [address], [place], [place] [address]    Special Requests   Final    NONE Performed at [facility], [place] [address] [place], [place] [address]    Culture >=100,000 COLONIES/mL ESCHERICHIA COLI (A)  Final   Report Status 05/04/2020 FINAL  Final   Organism ID, Bacteria ESCHERICHIA COLI (A)  Final      Susceptibility   Escherichia coli - MIC*    AMPICILLIN <=2 SENSITIVE Sensitive     CEFAZOLIN <=4 SENSITIVE Sensitive     CEFEPIME <=0.12 SENSITIVE Sensitive     CEFTRIAXONE <=0.25 SENSITIVE Sensitive     CIPROFLOXACIN <=0.25 SENSITIVE Sensitive     GENTAMICIN <=1 SENSITIVE Sensitive     IMIPENEM <=0.25 SENSITIVE Sensitive     NITROFURANTOIN <=16 SENSITIVE Sensitive     TRIMETH/SULFA <=20 SENSITIVE Sensitive     AMPICILLIN/SULBACTAM <=2 SENSITIVE Sensitive     PIP/TAZO <=4 SENSITIVE Sensitive     * >=100,000 COLONIES/mL ESCHERICHIA COLI  Surgical pcr screen     Status: Abnormal   Collection Time: 05/04/20  5:37 AM   Specimen: Nasal Mucosa; Nasal Swab  Result Value Ref Range Status   MRSA, PCR NEGATIVE NEGATIVE Final   Staphylococcus aureus POSITIVE (A) NEGATIVE Final    Comment: (  NOTE) The Xpert SA Assay (FDA approved for NASAL specimens in patients 39 years of age and older), is one component of a comprehensive surveillance program. It is not intended to diagnose infection nor to guide or monitor  treatment. Performed at [facility], [place] [address] [place], [place] [address]      RN Pressure Injury Documentation:     Estimated body mass index is 27.14 kg/m as calculated from the following:   Height as of this encounter: 5\' 3"  (1.6 m).   Weight as of this encounter: 69.5 kg.  Malnutrition Type:  Nutrition Problem: Inadequate oral intake Etiology: poor appetite   Malnutrition Characteristics:  Signs/Symptoms: per patient/family report   Nutrition Interventions:  Interventions: Ensure Enlive (each supplement provides 350kcal and 20 grams of protein)     Radiology Studies: No results found. Scheduled Meds: . cephALEXin  250 mg Oral Q6H  . Chlorhexidine Gluconate Cloth  6 each Topical Q0600  . dorzolamide  1 drop Both Eyes BID  . feeding supplement  237 mL Oral BID BM  . latanoprost  1 drop Both Eyes QHS  . losartan  50 mg Oral Daily  . mupirocin ointment  1 application Nasal BID  . oxyCODONE  10 mg Oral Q12H  . senna-docusate  1 tablet Oral BID  . sodium chloride flush  3 mL Intravenous Q12H   Continuous Infusions: . sodium chloride Stopped (05/02/20 2103)  . sodium chloride      LOS: 5 days   Kerney Elbe, DO [facility] PAGER is on [place]  If 7PM-7AM, please contact night-coverage [email or web address]

## 2020-05-06 NOTE — Consult Note (Signed)
   [facility] Inpatient Consult   05/06/2020  ROSEALIE REACH 1927-09-18 [id number]  [facility] [[facility]]  Accountable Care Organization [ACO] Patient: Danielle Thomas Medicare    Patient screened for review for post hospitalization needs for potential [facility]  [[facility]] Care Management service.  Review of patient's medical record reveals patient is for currently being recommended for CIR transition of care.  Primary Care Provider is Gweneth Dimitri, MD  this provider is listed with [facility] and is to provide the transition of care [TOC] for post Thomas follow up.   Plan:  Continue to follow progress and disposition to assess for post Thomas care management needs.    Please place a [facility] Care Management consult as appropriate and for questions contact:   Charlesetta Shanks, RN BSN CCM [facility]  [phone] business mobile phone Toll free office [phone]  Fax number: [phone] [email or web address]

## 2020-05-06 NOTE — PMR Pre-admission (Signed)
PMR Admission Coordinator Pre-Admission Assessment  Patient: Danielle Thomas is an 86 y.o., female MRN: [id number] DOB: 1927-05-23 Height: _0  (160 cm) Weight: 76.9 kg  Insurance Information HMO:     PPO:      PCP:      IPA:      80/20:      OTHER:  PRIMARY: [employer]      Policy#: [id number]      Subscriber: pt CM Name: Lattie Haw     Phone#: [phone] ext [phone]   Fax#: [phone] Pre-Cert#: [id number] approved for 7 days after peer to peer completed   F/u with [facility] ext [phone] fax [phone]  Employer:  Benefits:  Phone #: [phone]     Name: 12/30 Eff. Date: 05/09/2019  Deduct: none      Out of Pocket Max: $3900      Life Max: none CIR: $295 co pay per day days 1 until 6      SNF: no copay days 1 until 20; $184 co pay per day days 21 until 100 Outpatient: $10 to $40 per visit     Co-Pay: visits per medical neccesity Home Health: 100%      Co-Pay: visits per medical neccesity DME: 80%     Co-Pay: 20% Providers: in network  SECONDARY: none      Policy#:      Phone#:   Development worker, community:       Phone#:   The Engineer, petroleum" for patients in Inpatient Rehabilitation Facilities with attached "Privacy Act [place] Records" was provided and verbally reviewed with: Patient and Family  Emergency Contact Information Contact Information    Name Relation Home Work [place] Daughter [phone]  [place], [place] Daughter   [phone]      Current Medical History  Patient Admitting Diagnosis: T 11 Compression fracture  History of Present Illness:   Danielle Thomas is a 86 year old right-handed female with history of atrial fibrillation maintained on Coumadin, hypertension, restless leg syndrome.   She has family that check on her routinely.  Patient with recent admission 03/27/2020 to 03/29/2020 for multiple electrolyte abnormalities.  She was found to have T11 compression fracture which was managed with pain  medication and a TLSO back brace.  She was discharged to skilled nursing facility at that time.  She was in a skilled nursing facility for 2 weeks before returning home and has essentially been bedbound with difficulty in ambulation.  Presented 05/01/2020 with increasing back pain.  Admission chemistry sodium 134 potassium 2.9 glucose 130 BUN 22 creatinine 0.90, WBC 11,300 hemoglobin 16.9, urine culture greater 100,000 E. coli.  MRI thoracic lumbar spine showed progressed T11 compression fracture since last month increased comminution.  Loss of height increased from 20 to 40% and new retropulsion of bone resulting in mild spinal stenosis with mild cord mass-effect but no spinal cord signal abnormality.  Associated acute to subacute posterior left rib fractures 9 through 11.  Left paraspinal soft tissue edema from T9-T12.  Patient underwent thoracic T10-11 arthrodesis allograft morsels percutaneous pedicle screws with fixation 05/04/2020 per Dr. Christella Noa.  She continues with back brace as directed.  Her chronic Coumadin was initially held for surgical procedure has since been resumed.  Patient completed course of Keflex for E. coli UTI.  Pain management use of scheduled OxyContin.  Physical medicine rehabilitation consulted patient felt to be a candidate for inpatient rehab service due to decrease in functional mobility pain management.  Patient's medical record from [facility]  has been reviewed by the rehabilitation admission coordinator and physician.  Past Medical History  Past Medical History:  Diagnosis Date  . Hypertension     Family History   family history includes Other in an other family member.  Prior Rehab/Hospitalizations Has the patient had prior rehab or hospitalizations prior to admission? Yes  Has the patient had major surgery during 100 days prior to admission? Yes   Current Medications  Current Facility-Administered Medications:  .  0.9 %  sodium chloride infusion, ,  Intravenous, PRN, Ashok Pall, MD, Stopped at 05/02/20 2103 .  0.9 %  sodium chloride infusion, 250 mL, Intravenous, Continuous, Cabbell, Kyle, MD .  acetaminophen (TYLENOL) tablet 1,000 mg, 1,000 mg, Oral, TID, Hongalgi, Anand D, MD, 1,000 mg at 05/13/20 0835 .  bisacodyl (DULCOLAX) EC tablet 5 mg, 5 mg, Oral, Daily PRN, Ashok Pall, MD, 5 mg at 05/02/20 0759 .  bisacodyl (DULCOLAX) suppository 10 mg, 10 mg, Rectal, Daily PRN, Sheikh, Omair Latif, DO .  diazepam (VALIUM) tablet 5 mg, 5 mg, Oral, Q6H PRN, Ashok Pall, MD, 5 mg at 05/13/20 0645 .  dorzolamide (TRUSOPT) 2 % ophthalmic solution 1 drop, 1 drop, Both Eyes, BID, Ashok Pall, MD, 1 drop at 05/13/20 0837 .  feeding supplement (ENSURE ENLIVE / ENSURE PLUS) liquid 237 mL, 237 mL, Oral, BID BM, Ashok Pall, MD, 237 mL at 05/13/20 0836 .  hydrALAZINE (APRESOLINE) injection 10 mg, 10 mg, Intravenous, Q6H PRN, Raiford Noble Latif, DO, 10 mg at 05/05/20 0238 .  hydrocortisone (ANUSOL-HC) 2.5 % rectal cream, , Rectal, BID, Raiford Noble [place], [place], Given at 05/13/20 [phone] .  latanoprost (XALATAN) 0.005 % ophthalmic solution 1 drop, 1 drop, Both Eyes, QHS, Cabbell, Kyle, MD, 1 drop at 05/12/20 2122 .  losartan (COZAAR) tablet 50 mg, 50 mg, Oral, Daily, Ashok Pall, MD, 50 mg at 05/13/20 0837 .  menthol-cetylpyridinium (CEPACOL) lozenge 3 mg, 1 lozenge, Oral, PRN **OR** phenol (CHLORASEPTIC) mouth spray 1 spray, 1 spray, Mouth/Throat, PRN, Ashok Pall, MD .  multivitamin with minerals tablet 1 tablet, 1 tablet, Oral, Daily, Raiford Noble [place], [place], 1 tablet at 05/13/20 [phone] .  ondansetron (ZOFRAN) tablet 4 mg, 4 mg, Oral, Q6H PRN **OR** ondansetron (ZOFRAN) injection 4 mg, 4 mg, Intravenous, Q6H PRN, Ashok Pall, MD .  polyethylene glycol (MIRALAX / GLYCOLAX) packet 17 g, 17 g, Oral, BID, Raiford Noble [place], DO, 17 g at 05/13/20 0835 .  rOPINIRole (REQUIP) tablet 0.25 mg, 0.25 mg, Oral, BID PRN, Ashok Pall, MD, 0.25 mg at 05/12/20  2121 .  senna-docusate (Senokot-S) tablet 1 tablet, 1 tablet, Oral, BID, Ashok Pall, MD, 1 tablet at 05/13/20 0835 .  sodium chloride flush (NS) 0.9 % injection 3 mL, 3 mL, Intravenous, Q12H, Ashok Pall, MD, 3 mL at 05/13/20 0837 .  sodium chloride flush (NS) 0.9 % injection 3 mL, 3 mL, Intravenous, PRN, Ashok Pall, MD .  Derrill Memo ON 05/14/2020] warfarin (COUMADIN) tablet 1 mg, 1 mg, Oral, Q48H, Dang, Thuy D, RPH .  warfarin (COUMADIN) tablet 1.5 mg, 1.5 mg, Oral, Q48H, Dang, Thuy D, RPH .  Warfarin - Pharmacist Dosing Inpatient, , Does not apply, q1600, Henri Medal, [facility], Given at 05/12/20 1618 .  zolpidem (AMBIEN) tablet 5 mg, 5 mg, Oral, QHS PRN, Ashok Pall, MD, 5 mg at 05/12/20 2122  Patients Current Diet:  Diet Order            Diet - low sodium heart  healthy           Diet regular Room service appropriate? Yes; Fluid consistency: Thin  Diet effective now                 Precautions / Restrictions Precautions Precautions: Fall,Back Precaution Booklet Issued: Yes (comment) (unable to recall) Precaution Comments: LSO Spinal Brace: Lumbar corset,Applied in sitting position Restrictions Weight Bearing Restrictions: No   Has the patient had 2 or more falls or a fall with injury in the past year? Yes  Prior Activity Level Limited Community (1-2x/wk): Mod I ot supervision with RW  Prior Functional Level Self Care: Did the patient need help bathing, dressing, using the toilet or eating? Needed some help  Indoor Mobility: Did the patient need assistance with walking from room to room (with or without device)? Needed some help  Stairs: Did the patient need assistance with internal or external stairs (with or without device)? Needed some help  Functional Cognition: Did the patient need help planning regular tasks such as shopping or remembering to take medications? Needed some help  Home Assistive Devices / [place] Devices/Equipment: Kasandra Knudsen (specify quad  or straight),Grab bars around toilet,Grab bars in [place]: [place] - single point,Walker - 4 wheels  Prior Device Use: Indicate devices/aids used by the patient prior to current illness, exacerbation or injury? Walker rollator  Current Functional Level Cognition  Overall Cognitive Status: Impaired/Different from baseline Current Attention Level: Selective Orientation Level: Disoriented to time,Oriented to person,Oriented to place,Oriented to situation Following Commands: Follows one step commands with increased time (pt extremely HOH) Safety/Judgement: Decreased awareness of safety General Comments: pt impulsive and quick to move, needs cues for back precautions and is unable to don/doff brace without assist    Extremity Assessment (includes Sensation/Coordination)  Upper Extremity Assessment: Generalized weakness  Lower Extremity Assessment: Defer to PT evaluation    ADLs  Overall ADL's : Needs assistance/impaired Eating/Feeding: Set up,Sitting Grooming: Min guard,Standing Grooming Details (indicate cue type and reason): mod cues to wash hands and not just exit the bathroom Upper Body Bathing: Minimal assistance,Sitting Lower Body Bathing: Minimal assistance,Sit to/from stand Upper Body Dressing : Set up,Sitting Upper Body Dressing Details (indicate cue type and reason): Setup to don clean hospital gown Lower Body Dressing: Moderate assistance,Sit to/from stand Lower Body Dressing Details (indicate cue type and reason): pt able to figure 4 cross R le but unable to do L LE with brief. pt needs cues to avoid bending over for back precautions Toilet Transfer: Film/video editor Details (indicate cue type and reason): pt requires cues for hand placement Toileting- Clothing Manipulation and Hygiene: Minimal assistance Toileting - Clothing Manipulation Details (indicate cue type and reason): Mod A for mgmt of brief, pt hyperfocused  on pain. Functional mobility during ADLs: Min guard,Rolling walker General ADL Comments: pt with poor recall of back precautiosn with adls. pt also HOH and needs visual redirection at times    Mobility  Overal bed mobility: Needs Assistance Bed Mobility: Rolling,Sit to Sidelying Rolling: Supervision Sidelying to sit: Min assist Sit to supine: Min assist Sit to sidelying: Min assist General bed mobility comments: pt impulsive, verbal cues to not reach behind self when return to supine, educated on log roll technique to adhere to back precautions    Transfers  Overall transfer level: Needs assistance Equipment used: Rolling walker (2 wheeled) Transfers: Sit to/from Stand Sit to Stand: Min guard Stand pivot transfers: Min guard General transfer comment: verbal cues for safe  hand placement with poor carryover of cues; needs hand over hand assist for proper placement when sitting; x3 reps    Ambulation / Gait / Stairs / Wheelchair Mobility  Ambulation/Gait Ambulation/Gait assistance: Min guard,Min assist Gait Distance (Feet): 20 Feet (x3) Assistive device: Rolling walker (2 wheeled) Gait Pattern/deviations: Step-through pattern,Decreased stride length General Gait Details: deferred gait progression as pt tachycardic in standing (HR 115 bpm), pt performed pivot transfer and stepping at bedside ~55f with minA and RW Gait velocity: dec Gait velocity interpretation: <1.31 ft/sec, indicative of household ambulator    Posture / Balance Dynamic Sitting Balance Sitting balance - Comments: no LOB but pt typically using BUE for support due to anxiety Balance Overall balance assessment: Needs assistance Sitting-balance support: Feet supported Sitting balance-Leahy Scale: Fair Sitting balance - Comments: no LOB but pt typically using BUE for support due to anxiety Standing balance support: Bilateral upper extremity supported Standing balance-Leahy Scale: Poor Standing balance comment: needs RW  for support and external assist due to impulsivity    Special needs/care consideration Pain Management needs   Previous Home Environment  Living Arrangements: Alone  Lives With: Alone Available Help at Discharge: Family,Available PRN/intermittently [place] of Home: [place] Two level,Able to live on main level with bedroom/bathroom Alternate Level Stairs-Rails: Right Alternate Level Stairs-Number of Steps: 13 Home Access: Stairs to enter ECenterPoint Energyof Steps: 1 Bathroom Shower/Tub: TEngineer, petroleum Handicapped height Bathroom Accessibility: Yes How Accessible: Accessible via walker [place] Yes Type of Home Care Services: [facility] [place](if known): unknown Additional Comments: Commode downstairs in handicap height and one upstairs is not.  Discharge Living Setting Plans for Discharge Living Setting: Patient's home,Alone Type of Home at Discharge: House Discharge Home Layout: Two level,Able to live on main level with bedroom/bathroom Alternate Level Stairs-Rails: Right Alternate Level Stairs-Number of Steps: 13 Discharge Home Access: Stairs to enter Entrance Stairs-Rails: None Entrance Stairs-Number of Steps: 1 Discharge Bathroom Shower/Tub: Tub/shower unit Discharge Bathroom Toilet: Handicapped height Discharge Bathroom Accessibility: Yes How Accessible: Accessible via walker Does the patient have any problems obtaining your medications?: No  Social/Family/Support Systems Contact Information: daughter, KCurt BearsAnticipated Caregiver: daughter prn Anticipated Caregiver's Contact Information: see above Caregiver Availability: Intermittent Discharge Plan Discussed with Primary Caregiver: Yes Is Caregiver In Agreement with Plan?: Yes Does Caregiver/Family have Issues with Lodging/Transportation while Pt is in Rehab?: No  Goals Patient/Family Goal for Rehab: Mod I to supervision with PT and OT Expected  length of stay: ELOS 10 to 14 days Pt/Family Agrees to Admission and willing to participate: Yes Program Orientation Provided & Reviewed with Pt/Caregiver Including Roles  & Responsibilities: Yes  Decrease burden of Care through IP rehab admission: n/a  Possible need for SNF placement upon discharge: not anticipated  Patient Condition: I have reviewed medical records from [facility], spoken with  patient and daughter. I met with patient at the bedside for inpatient rehabilitation assessment.  Patient will benefit from ongoing PT and OT, can actively participate in 3 hours of therapy a day 5 days of the week, and can make measurable gains during the admission.  Patient will also benefit from the coordinated team approach during an Inpatient Acute Rehabilitation admission.  The patient will receive intensive therapy as well as Rehabilitation physician, nursing, social worker, and care management interventions.  Due to bladder management, bowel management, safety, skin/wound care, disease management, medication administration, pain management and patient education the patient requires 24 hour a day rehabilitation  nursing.  The patient is currently min to mod assistoverall with mobility and basic ADLs.  Discharge setting and therapy post discharge at home with home health is anticipated.  Patient has agreed to participate in the Acute Inpatient Rehabilitation Program and will admit today.  Preadmission Screen Completed By: Cleatrice Burke, 05/13/2020 12:54 PM ______________________________________________________________________   Discussed status with Dr. Dagoberto Ligas  on  05/13/2020 at 1255 and received approval for admission today.  Admission Coordinator:   Cleatrice Burke, RN, time  [id number] Date 05/13/2020   Assessment/Plan: Diagnosis: 1. Does the need for close, 24 hr/day Medical supervision in concert with the patient's rehab needs make it unreasonable for this patient to be served in  a less intensive setting? Yes 2. Co-Morbidities requiring supervision/potential complications: compression fx at T111; s/p arthrodesis T10-11 with fixation- HOH, L rib fx's L 9-11- Afib- on coumadin; HTN, RLS 3. Due to bladder management, bowel management, safety, skin/wound care, disease management, medication administration, pain management and patient education, does the patient require 24 hr/day rehab nursing? Yes 4. Does the patient require coordinated care of a physician, rehab nurse, PT, OT, and SLP to address physical and functional deficits in the context of the above medical diagnosis(es)? Yes Addressing deficits in the following areas: balance, endurance, locomotion, strength, transferring, bathing, dressing, feeding, grooming and toileting 5. Can the patient actively participate in an intensive therapy program of at least 3 hrs of therapy 5 days a week? Yes 6. The potential for patient to make measurable gains while on inpatient rehab is good and fair 7. Anticipated functional outcomes upon discharge from inpatient rehab: modified independent and supervision PT, modified independent and supervision OT, n/a SLP 8. Estimated rehab length of stay to reach the above functional goals is: 10-14 days 9. Anticipated discharge destination: Home 10. Overall Rehab/Functional Prognosis: good and fair   MD Signature:

## 2020-05-06 NOTE — Progress Notes (Signed)
  NEUROSURGERY PROGRESS NOTE   No issues overnight.  complains of appropriate back soreness No LE symptoms  EXAM:  BP (!) 124/54   Pulse 99   Temp (!) 97.5 F (36.4 C) (Oral)   Resp 14   Ht 5\' 3"  (1.6 m)   Wt 69.5 kg   SpO2 96%   BMI 27.14 kg/m   Awake, alert, oriented  Speech fluent, appropriate  CN grossly intact  MAEW, seemingly nonfoacl Incision: dried blood on bandage  IMPRESSION/PLAN 86 y.o. female s/p T9-12 fixation for T11 fracture. Progressing well. - CIR candidate - continue PT/OT

## 2020-05-06 NOTE — Progress Notes (Signed)
ANTICOAGULATION CONSULT NOTE - Initial Consult  Pharmacy Consult for Warfarin Indication: atrial fibrillation  Allergies  Allergen Reactions  . Amlodipine Besylate     Other reaction(s): high dose make feet swell  . Codeine Nausea And Vomiting  . Meperidine Nausea Only  . Metoprolol Succinate [Metoprolol]     Other reaction(s): effects her breathing  . Oxycodone Nausea And Vomiting    Patient Measurements: Height: 5\' 3"  (160 cm) Weight: 69.5 kg (153 lb 3.2 oz) IBW/kg (Calculated) : 52.4   Vital Signs: Temp: 97.5 F (36.4 C) (12/30 0741) Temp Source: Oral (12/30 0741) BP: 124/54 (12/30 0741) Pulse Rate: 99 (12/30 0741)  Labs: Recent Labs    05/04/20 0550 05/04/20 0840 05/05/20 0212 05/06/20 0137  HGB 16.6*  --  14.5 13.0  HCT 51.8*  --  42.3 38.3  PLT 198  --  216 155  LABPROT  --  17.5* 15.1 15.2  INR  --  1.5* 1.2 1.2  CREATININE 1.01*  --  0.87 0.83    Estimated Creatinine Clearance: 42.1 mL/min (by C-G formula based on SCr of 0.83 mg/dL).   Medical History: Past Medical History:  Diagnosis Date  . Hypertension     Medications:  Medications Prior to Admission  Medication Sig Dispense Refill Last Dose  . dorzolamide (TRUSOPT) 2 % ophthalmic solution Place 1 drop into both eyes 2 (two) times daily.    05/01/2020 at Unknown time  . hydrochlorothiazide (MICROZIDE) 12.5 MG capsule Take 1 capsule (12.5 mg total) by mouth daily. *Please call and schedule a one year follow up appointment* (Patient taking differently: Take 12.5 mg by mouth daily.) 30 capsule 0 05/01/2020 at Unknown time  . losartan (COZAAR) 50 MG tablet Take 50 mg by mouth daily.   05/01/2020 at Unknown time  . methocarbamol (ROBAXIN) 500 MG tablet Take 250 mg by mouth daily as needed for muscle spasms.   05/01/2020 at Unknown time  . oxyCODONE-acetaminophen (PERCOCET/ROXICET) 5-325 MG tablet Take 1 tablet by mouth every 4 (four) hours as needed for severe pain.   05/01/2020 at Unknown time  .  rOPINIRole (REQUIP) 0.25 MG tablet Take 0.25 mg by mouth 2 (two) times daily as needed (restless legs). Restless leg   04/30/2020 at Unknown time  . travoprost, benzalkonium, (TRAVATAN) 0.004 % ophthalmic solution Place 1 drop into both eyes at bedtime.    04/30/2020 at Unknown time  . warfarin (COUMADIN) 1 MG tablet Take 1 mg by mouth every other day.   04/29/2020 at 7 pm  . warfarin (COUMADIN) 3 MG tablet Take 1.5 mg by mouth every other day.   04/30/2020 at 7 pm  . amLODipine (NORVASC) 10 MG tablet Take 1 tablet (10 mg total) by mouth daily. (Patient not taking: Reported on 05/01/2020)   Not Taking at Unknown time  . polyethylene glycol (MIRALAX / GLYCOLAX) 17 g packet Take 17 g by mouth 2 (two) times daily as needed for mild constipation (use first). (Patient not taking: No sig reported) 14 each 0 Not Taking at Unknown time  . senna-docusate (SENOKOT-S) 8.6-50 MG tablet Take 1 tablet by mouth 2 (two) times daily between meals as needed for mild constipation. (Patient not taking: No sig reported) 60 tablet 0 Not Taking at Unknown time    Assessment: 86 yo female admitted on 05/01/2020 with back pain found to have T11 compression fracture. Patient is s/p thoracic 10-11 arthrodesis with neurosurgery. Patient is on warfarin prior to admission for Afib which has been held. Pharmacy  consulted to resume warfarin per Dr. Marland Mcalpine and neurosurgery. S/p vitamin K 10mg  po on 12/27.   INR 1.2. CBC wnl. No reported bleeding.  Prior to admission warfarin regimen per patient: alternating 1mg  and 1.5mg  every other night - no specific dates for each just alternating per patient. No anticoagulation clinic notes in [facility] or Care Everywhere   Goal of Therapy:  INR 2-3 Monitor platelets by anticoagulation protocol: Yes   Plan:  Warfarin 2mg  x1 tonight  Follow up INR tomorrow   , PharmD Clinical Pharmacist  05/06/2020,2:09 PM

## 2020-05-06 NOTE — Progress Notes (Signed)
Physical Therapy Treatment Patient Details Name: Danielle Thomas MRN: [id number] DOB: Dec 17, 1927 Today's Date: 05/06/2020    History of Present Illness Danielle Thomas is a 86 y.o. female with medical history significant for atrial fibrillation on Coumadin, hypertension, restless leg syndrome who presents to the ED for evaluation of uncontrolled back pain.    She says that since she has been in the hospital she has been essentially bedbound due to difficulty ambulating secondary to continued back pain. Patient was recently admitted from 03/27/2020-03/29/2020 for multiple electrolyte abnormalities.  She was also found to have a T11 compression fracture which was managed with pain medication and TLSO brace.  She was discharged to SNF with plan for outpatient follow-up    PT Comments    Pt was changed to LSO today, assisted to apply it in a sitting posture.  Pt is very painful and awkwardly trying to lie on the bed, but with assistance could sit upright.  Pt was able to walk to the chair, positioned her in supported spinal posture.  Pt suddenly became very upset and stated she just could not tolerate the chair despite PT asking her to use more support of legs and spine.  Went back to bed and repositioned on L side with pillows, nursing in to offer meds and work on getting pt comfortable.  LE ROM was initiated on ankles and knees in chair before pt became unable to consider staying in the chair.  Follow for goals of acute PT toward transfer to CIR.     Follow Up Recommendations  CIR     Equipment Recommendations  Rolling walker with 5" wheels;3in1 (PT);Other (comment)    Recommendations for Other Services       Precautions / Restrictions Precautions Precautions: Fall;Back Precaution Booklet Issued: No Precaution Comments: LSO in room Required Braces or Orthoses: Spinal Brace Spinal Brace: Lumbar corset;Applied in sitting position Restrictions Weight Bearing Restrictions: No    Mobility   Bed Mobility Overal bed mobility: Needs Assistance Bed Mobility: Sidelying to Sit;Sit to Sidelying Rolling: Min assist;Min guard Sidelying to sit: Mod assist     Sit to sidelying: Mod assist    Transfers Overall transfer level: Needs assistance Equipment used: Rolling walker (2 wheeled);1 person hand held assist Transfers: Sit to/from Stand Sit to Stand: Mod assist            Ambulation/Gait Ambulation/Gait assistance: Min assist Gait Distance (Feet): 10 Feet (5 x 2) Assistive device: Rolling walker (2 wheeled) Gait Pattern/deviations: Step-to pattern;Step-through pattern;Decreased stride length;Wide base of support Gait velocity: reduced   General Gait Details: dense repetitive cues for safety and set up both getting to chair and back to bed   Stairs             Wheelchair Mobility    Modified Rankin (Stroke Patients Only)       Balance     Sitting balance-Leahy Scale: Fair       Standing balance-Leahy Scale: Poor                              Cognition Arousal/Alertness: Awake/alert Behavior During Therapy: WFL for tasks assessed/performed Overall Cognitive Status: Impaired/Different from baseline Area of Impairment: Problem solving;Awareness;Safety/judgement;Following commands;Memory;Attention;Orientation                 Orientation Level: Situation Current Attention Level: Selective Memory: Decreased recall of precautions Following Commands: Follows one step commands inconsistently;Follows one step commands with  increased time Safety/Judgement: Decreased awareness of safety;Decreased awareness of deficits Awareness: Intellectual Problem Solving: Slow processing;Requires verbal cues        Exercises General Exercises - Lower Extremity Ankle Circles/Pumps: AAROM;5 reps Long Arc Quad: AAROM;AROM;10 reps Heel Slides: AROM;AAROM;10 reps Hip ABduction/ADduction: AAROM;10 reps    General Comments General comments (skin  integrity, edema, etc.): pt had some struggle to stand up due to her pain and distraction from it      Pertinent Vitals/Pain Pain Assessment: Faces Pain Score: 8  Faces Pain Scale: Hurts whole lot Pain Location: low back with sitting up from side Pain Descriptors / Indicators: Grimacing;Guarding;Operative site guarding Pain Intervention(s): Monitored during session;Repositioned    Home Living                      Prior Function            PT Goals (current goals can now be found in the care plan section) Acute Rehab PT Goals Patient Stated Goal: pain control Progress towards PT goals: Progressing toward goals    Frequency    Min 4X/week      PT Plan Current plan remains appropriate    Co-evaluation              AM-PAC PT "6 Clicks" Mobility   Outcome Measure  Help needed turning from your back to your side while in a flat bed without using bedrails?: A Little Help needed moving from lying on your back to sitting on the side of a flat bed without using bedrails?: A Lot Help needed moving to and from a bed to a chair (including a wheelchair)?: A Lot Help needed standing up from a chair using your arms (e.g., wheelchair or bedside chair)?: A Lot Help needed to walk in hospital room?: A Little Help needed climbing 3-5 steps with a railing? : A Lot 6 Click Score: 14    End of Session Equipment Utilized During Treatment: Gait belt;Back brace Activity Tolerance: Patient tolerated treatment well;Patient limited by pain Patient left: in bed;with call bell/phone within reach;with bed alarm set Nurse Communication: Mobility status PT Visit Diagnosis: Unsteadiness on feet (R26.81);Other abnormalities of gait and mobility (R26.89);Pain     Time: 5465-0354 PT Time Calculation (min) (ACUTE ONLY): 40 min  Charges:  $Gait Training: 8-22 mins $Therapeutic Exercise: 8-22 mins $Therapeutic Activity: 8-22 mins                   Ivar Drape 05/06/2020, 6:49  PM  Samul Dada, PT MS Acute Rehab Dept. Number: [facility] [id number] and [facility] [phone]

## 2020-05-07 DIAGNOSIS — I10 Essential (primary) hypertension: Secondary | ICD-10-CM | POA: Diagnosis not present

## 2020-05-07 DIAGNOSIS — I480 Paroxysmal atrial fibrillation: Secondary | ICD-10-CM | POA: Diagnosis not present

## 2020-05-07 DIAGNOSIS — S22080A Wedge compression fracture of T11-T12 vertebra, initial encounter for closed fracture: Secondary | ICD-10-CM | POA: Diagnosis not present

## 2020-05-07 DIAGNOSIS — E876 Hypokalemia: Secondary | ICD-10-CM | POA: Diagnosis not present

## 2020-05-07 LAB — PHOSPHORUS: Phosphorus: 1.9 mg/dL — ABNORMAL LOW (ref 2.5–4.6)

## 2020-05-07 LAB — CBC WITH DIFFERENTIAL/PLATELET
Abs Immature Granulocytes: 0.07 10*3/uL (ref 0.00–0.07)
Basophils Absolute: 0 10*3/uL (ref 0.0–0.1)
Basophils Relative: 0 %
Eosinophils Absolute: 0.2 10*3/uL (ref 0.0–0.5)
Eosinophils Relative: 2 %
HCT: 37.3 % (ref 36.0–46.0)
Hemoglobin: 12.6 g/dL (ref 12.0–15.0)
Immature Granulocytes: 1 %
Lymphocytes Relative: 11 %
Lymphs Abs: 1.2 10*3/uL (ref 0.7–4.0)
MCH: 30.1 pg (ref 26.0–34.0)
MCHC: 33.8 g/dL (ref 30.0–36.0)
MCV: 89.2 fL (ref 80.0–100.0)
Monocytes Absolute: 1.3 10*3/uL — ABNORMAL HIGH (ref 0.1–1.0)
Monocytes Relative: 11 %
Neutro Abs: 8.6 10*3/uL — ABNORMAL HIGH (ref 1.7–7.7)
Neutrophils Relative %: 75 %
Platelets: 200 10*3/uL (ref 150–400)
RBC: 4.18 MIL/uL (ref 3.87–5.11)
RDW: 16 % — ABNORMAL HIGH (ref 11.5–15.5)
WBC: 11.4 10*3/uL — ABNORMAL HIGH (ref 4.0–10.5)
nRBC: 0 % (ref 0.0–0.2)

## 2020-05-07 LAB — COMPREHENSIVE METABOLIC PANEL
ALT: 21 U/L (ref 0–44)
AST: 23 U/L (ref 15–41)
Albumin: 2.4 g/dL — ABNORMAL LOW (ref 3.5–5.0)
Alkaline Phosphatase: 53 U/L (ref 38–126)
Anion gap: 8 (ref 5–15)
BUN: 20 mg/dL (ref 8–23)
CO2: 27 mmol/L (ref 22–32)
Calcium: 8 mg/dL — ABNORMAL LOW (ref 8.9–10.3)
Chloride: 100 mmol/L (ref 98–111)
Creatinine, Ser: 0.75 mg/dL (ref 0.44–1.00)
GFR, Estimated: >60 mL/min (ref >60)
Glucose, Bld: 112 mg/dL — ABNORMAL HIGH (ref 70–99)
Potassium: 4.3 mmol/L (ref 3.5–5.1)
Sodium: 135 mmol/L (ref 135–145)
Total Bilirubin: 1.1 mg/dL (ref 0.3–1.2)
Total Protein: 5.1 g/dL — ABNORMAL LOW (ref 6.5–8.1)

## 2020-05-07 LAB — MAGNESIUM: Magnesium: 2 mg/dL (ref 1.7–2.4)

## 2020-05-07 LAB — PROTIME-INR
INR: 1.2 (ref 0.8–1.2)
Prothrombin Time: 14.5 seconds (ref 11.4–15.2)

## 2020-05-07 MED ORDER — WARFARIN SODIUM 2 MG PO TABS
2.0000 mg | ORAL_TABLET | Freq: Once | ORAL | Status: AC
  Administered 2020-05-07: 2 mg via ORAL
  Filled 2020-05-07: qty 1

## 2020-05-07 MED ORDER — ADULT MULTIVITAMIN W/MINERALS CH
1.0000 | ORAL_TABLET | Freq: Every day | ORAL | Status: DC
  Administered 2020-05-07 – 2020-05-13 (×7): 1 via ORAL
  Filled 2020-05-07 (×6): qty 1

## 2020-05-07 MED ORDER — K PHOS MONO-SOD PHOS DI & MONO 155-852-130 MG PO TABS
500.0000 mg | ORAL_TABLET | Freq: Two times a day (BID) | ORAL | Status: AC
  Administered 2020-05-07 (×2): 500 mg via ORAL
  Filled 2020-05-07 (×2): qty 2

## 2020-05-07 NOTE — Progress Notes (Signed)
   Providing Compassionate, Quality Care - Together  NEUROSURGERY PROGRESS NOTE   S: No issues overnight.   O: EXAM:  BP 132/84 (BP Location: Left Arm)   Pulse 99   Temp (!) 97.4 F (36.3 C) (Oral)   Resp 16   Ht 5\' 3"  (1.6 m)   Wt 73.1 kg   SpO2 97%   BMI 28.55 kg/m   Awake, alert, oriented  Speech fluent, appropriate  CNs grossly intact  5/5 BUE/BLE  Incision clean dry and intact, ecchymoses noted  ASSESSMENT:  86 y.o. female with  1.  T11 fracture, status post T9-12 instrumentation  PLAN: -Pain control PT/OT, rehab candidate    Thank you for allowing me to participate in this patient's care.  Please do not hesitate to call with questions or concerns.   95, DO Neurosurgeon [facility] Cell: [phone]

## 2020-05-07 NOTE — Progress Notes (Signed)
ANTICOAGULATION CONSULT NOTE - Follow Up Consult  Pharmacy Consult for Warfarin Indication: atrial fibrillation  Allergies  Allergen Reactions  . Amlodipine Besylate     Other reaction(s): high dose make feet swell  . Codeine Nausea And Vomiting  . Meperidine Nausea Only  . Metoprolol Succinate [Metoprolol]     Other reaction(s): effects her breathing  . Oxycodone Nausea And Vomiting    Patient Measurements: Height: 5\' 3"  (160 cm) Weight: 73.1 kg (161 lb 2.5 oz) IBW/kg (Calculated) : 52.4   Vital Signs: Temp: 97.4 F (36.3 C) (12/31 0725) Temp Source: Oral (12/31 0725) BP: 132/84 (12/31 0725) Pulse Rate: 99 (12/31 0725)  Labs: Recent Labs    05/05/20 0212 05/06/20 0137 05/07/20 0301  HGB 14.5 13.0 12.6  HCT 42.3 38.3 37.3  PLT 216 155 200  LABPROT 15.1 15.2 14.5  INR 1.2 1.2 1.2  CREATININE 0.87 0.83 0.75    Estimated Creatinine Clearance: 44.8 mL/min (by C-G formula based on SCr of 0.75 mg/dL).   Medical History: Past Medical History:  Diagnosis Date  . Hypertension     Medications:  Medications Prior to Admission  Medication Sig Dispense Refill Last Dose  . dorzolamide (TRUSOPT) 2 % ophthalmic solution Place 1 drop into both eyes 2 (two) times daily.    05/01/2020 at Unknown time  . hydrochlorothiazide (MICROZIDE) 12.5 MG capsule Take 1 capsule (12.5 mg total) by mouth daily. *Please call and schedule a one year follow up appointment* (Patient taking differently: Take 12.5 mg by mouth daily.) 30 capsule 0 05/01/2020 at Unknown time  . losartan (COZAAR) 50 MG tablet Take 50 mg by mouth daily.   05/01/2020 at Unknown time  . methocarbamol (ROBAXIN) 500 MG tablet Take 250 mg by mouth daily as needed for muscle spasms.   05/01/2020 at Unknown time  . oxyCODONE-acetaminophen (PERCOCET/ROXICET) 5-325 MG tablet Take 1 tablet by mouth every 4 (four) hours as needed for severe pain.   05/01/2020 at Unknown time  . rOPINIRole (REQUIP) 0.25 MG tablet Take 0.25 mg by  mouth 2 (two) times daily as needed (restless legs). Restless leg   04/30/2020 at Unknown time  . travoprost, benzalkonium, (TRAVATAN) 0.004 % ophthalmic solution Place 1 drop into both eyes at bedtime.    04/30/2020 at Unknown time  . warfarin (COUMADIN) 1 MG tablet Take 1 mg by mouth every other day.   04/29/2020 at 7 pm  . warfarin (COUMADIN) 3 MG tablet Take 1.5 mg by mouth every other day.   04/30/2020 at 7 pm  . amLODipine (NORVASC) 10 MG tablet Take 1 tablet (10 mg total) by mouth daily. (Patient not taking: Reported on 05/01/2020)   Not Taking at Unknown time  . polyethylene glycol (MIRALAX / GLYCOLAX) 17 g packet Take 17 g by mouth 2 (two) times daily as needed for mild constipation (use first). (Patient not taking: No sig reported) 14 each 0 Not Taking at Unknown time  . senna-docusate (SENOKOT-S) 8.6-50 MG tablet Take 1 tablet by mouth 2 (two) times daily between meals as needed for mild constipation. (Patient not taking: No sig reported) 60 tablet 0 Not Taking at Unknown time    Assessment: 86 yo female admitted on 05/01/2020 with back pain found to have T11 compression fracture. Patient is s/p thoracic 10-11 arthrodesis with neurosurgery. Patient is on warfarin prior to admission for Afib which has been held. Pharmacy consulted to resume warfarin per Dr. 05/03/2020 and neurosurgery on 05/06/20. S/p vitamin K 10mg  po on 12/27.  INR 1.2 is subtherapeutic. CBC wnl. No reported bleeding.  Prior to admission warfarin regimen per patient: alternating 1mg  and 1.5mg  every other night - no specific dates for each just alternating per patient. No anticoagulation clinic notes in [facility] or Care Everywhere   Goal of Therapy:  INR 2-3 Monitor platelets by anticoagulation protocol: Yes   Plan:  Warfarin 2mg  x1 tonight  Follow up INR tomorrow   Cristela Felt, PharmD Clinical Pharmacist  05/07/2020,9:38 AM

## 2020-05-07 NOTE — Progress Notes (Signed)
Nutrition Follow-up  DOCUMENTATION CODES:   Not applicable  INTERVENTION:   -Continue Ensure Enlive po BID, each supplement provides 350 kcal and 20 grams of protein -MVI with minerals daily  NUTRITION DIAGNOSIS:   Inadequate oral intake related to poor appetite as evidenced by per patient/family report.  Ongoing  GOAL:   Patient will meet greater than or equal to 90% of their needs  Progressing   MONITOR:   PO intake,Supplement acceptance,Labs,Weight trends,I & O's  REASON FOR ASSESSMENT:   Malnutrition Screening Tool    ASSESSMENT:   86 year old with history of A. fib on Coumadin, hypertension, restless leg syndrome who presents to the hospital for evaluation of uncontrolled back pain.  She has a known compression fracture on T11 which was diagnosed in November 2021 and was managed with pain medications, TLSO brace and was following with neurosurgery as an outpatient.  She was recently returned to home from a skilled nursing facility and after that she has been having severe back pain, essentially bedbound, unable to ambulate.  12/28- s/p Procedure(s): thoracic 10-11 arthrodesis, allograft morsels, bmp Thoracic nine to Thoracic twelve percutaneous pedicle screws For segmental pedicle screw fixation  Reviewed I/O's: -310 ml x 24 hours and +2.8 L since admission  UOP: 500 ml x 24 hours  Pt sleeping soundly at time of visit. RD did not disturb.   Per chart review, pt reports poor appetite. However, meal completions 75-100%. Pt is also consuming Ensure Enlive supplements per MAR.   Per TOC notes, plan for CIR vs SNF once medically stable for discharge.   Medications reviewed and include senokot.   Labs reviewed: Phos: 1.9 (on PO supplementation).   Diet Order:   Diet Order            Diet regular Room service appropriate? Yes; Fluid consistency: Thin  Diet effective now                 EDUCATION NEEDS:   No education needs have been identified at this  time  Skin:  Skin Assessment: Skin Integrity Issues: Skin Integrity Issues:: Incisions Incisions: closed back  Last BM:  05/05/20  Height:   Ht Readings from Last 1 Encounters:  05/01/20 5\' 3"  (1.6 m)    Weight:   Wt Readings from Last 1 Encounters:  05/07/20 73.1 kg   BMI:  Body mass index is 28.55 kg/m.  Estimated Nutritional Needs:   Kcal:  1650-1850  Protein:  85-100 grams  Fluid:  > 1.6 L    05/09/20, RD, LDN, CDCES Registered Dietitian II Certified Diabetes Care and Education Specialist Please refer to [facility] for RD and/or RD on-call/weekend/after hours pager

## 2020-05-07 NOTE — H&P (Incomplete)
Physical Medicine and Rehabilitation Admission H&P    Chief Complaint  Patient presents with  . Back Pain  : HPI: Danielle Thomas is a 86 year old right-handed female with history of atrial fibrillation maintained on Coumadin, hypertension, restless leg syndrome.  Per chart review she lives alone.  Two-level home bed and bath main level one-step to entry.  Uses a Rollator for mobility.  Patient was still driving grocery shopping prior to admission.  She has family that check on her routinely.  Patient with recent admission 03/27/2020 to 03/29/2020 for multiple electrolyte abnormalities.  She was found to have T11 compression fracture which was managed with pain medication and a TLSO back brace.  She was discharged to skilled nursing facility at that time.  She was in a skilled nursing facility for 2 weeks before returning home and has essentially been bedbound with difficulty in ambulation.  Presented 05/01/2020 with increasing back pain.  Admission chemistry sodium 134 potassium 2.9 glucose 130 BUN 22 creatinine 0.90, WBC 11,300 hemoglobin 16.9, urine culture greater 100,000 E. coli.  MRI thoracic lumbar spine showed progressed T11 compression fracture since last month increased comminution.  Loss of height increased from 20 to 40% and new retropulsion of bone resulting in mild spinal stenosis with mild cord mass-effect but no spinal cord signal abnormality.  Associated acute to subacute posterior left rib fractures 9 through 11.  Left paraspinal soft tissue edema from T9-T12.  Patient underwent thoracic T10-11 arthrodesis allograft morsels percutaneous pedicle screws with fixation 05/04/2020 per Dr. Franky Macho.  She continues with back brace as directed.  Her chronic Coumadin was initially held for surgical procedure has since been resumed.  Patient completed course of Keflex for E. coli UTI.  Pain management use of scheduled OxyContin.  Physical medicine rehabilitation consulted patient felt to be a  candidate for inpatient rehab service due to decrease in functional mobility pain management.  She was admitted for a comprehensive rehab program.  Review of Systems  Constitutional: Negative for chills and fever.  HENT: Negative for hearing loss.   Eyes: Negative for blurred vision and double vision.  Respiratory: Negative for cough and shortness of breath.   Cardiovascular: Positive for palpitations and leg swelling. Negative for chest pain.  Gastrointestinal: Positive for constipation. Negative for heartburn, nausea and vomiting.  Genitourinary: Negative for dysuria, flank pain and hematuria.  Musculoskeletal: Positive for back pain, joint pain and myalgias.  Skin: Negative for rash.  Neurological:       Restless leg syndrome  Psychiatric/Behavioral: The patient has insomnia.   All other systems reviewed and are negative.  Past Medical History:  Diagnosis Date  . Hypertension    Past Surgical History:  Procedure Laterality Date  . KNEE SURGERY    . LUMBAR PERCUTANEOUS PEDICLE SCREW 2 LEVEL N/A 05/04/2020   Procedure: Thoracic nine to Thoracic twelve percutaneous pedicle screws;  Surgeon: Coletta Memos, MD;  Location: [facility] OR;  Service: Neurosurgery;  Laterality: N/A;  . LUNG BIOPSY    . PARTIAL HYSTERECTOMY    . TONSILLECTOMY AND ADENOIDECTOMY     Family History  Problem Relation Age of Onset  . Other Other        NO HEALTH ISSUES   Social History:  reports that she has never smoked. She has never used smokeless tobacco. No history on file for alcohol use and drug use. Allergies:  Allergies  Allergen Reactions  . Amlodipine Besylate     Other reaction(s): high dose make feet swell  .  Codeine Nausea And Vomiting  . Meperidine Nausea Only  . Metoprolol Succinate [Metoprolol]     Other reaction(s): effects her breathing  . Oxycodone Nausea And Vomiting   Medications Prior to Admission  Medication Sig Dispense Refill  . dorzolamide (TRUSOPT) 2 % ophthalmic solution Place  1 drop into both eyes 2 (two) times daily.     . hydrochlorothiazide (MICROZIDE) 12.5 MG capsule Take 1 capsule (12.5 mg total) by mouth daily. *Please call and schedule a one year follow up appointment* (Patient taking differently: Take 12.5 mg by mouth daily.) 30 capsule 0  . losartan (COZAAR) 50 MG tablet Take 50 mg by mouth daily.    . methocarbamol (ROBAXIN) 500 MG tablet Take 250 mg by mouth daily as needed for muscle spasms.    Marland Kitchen oxyCODONE-acetaminophen (PERCOCET/ROXICET) 5-325 MG tablet Take 1 tablet by mouth every 4 (four) hours as needed for severe pain.    Marland Kitchen rOPINIRole (REQUIP) 0.25 MG tablet Take 0.25 mg by mouth 2 (two) times daily as needed (restless legs). Restless leg    . travoprost, benzalkonium, (TRAVATAN) 0.004 % ophthalmic solution Place 1 drop into both eyes at bedtime.     Marland Kitchen warfarin (COUMADIN) 1 MG tablet Take 1 mg by mouth every other day.    . warfarin (COUMADIN) 3 MG tablet Take 1.5 mg by mouth every other day.    Marland Kitchen amLODipine (NORVASC) 10 MG tablet Take 1 tablet (10 mg total) by mouth daily. (Patient not taking: Reported on 05/01/2020)    . polyethylene glycol (MIRALAX / GLYCOLAX) 17 g packet Take 17 g by mouth 2 (two) times daily as needed for mild constipation (use first). (Patient not taking: No sig reported) 14 each 0  . senna-docusate (SENOKOT-S) 8.6-50 MG tablet Take 1 tablet by mouth 2 (two) times daily between meals as needed for mild constipation. (Patient not taking: No sig reported) 60 tablet 0    Drug Regimen Review Drug regimen was reviewed and remains appropriate with no significant issues identified  Home: Home Living Family/patient expects to be discharged to:: Private residence Living Arrangements: Alone Available Help at Discharge: Family,Available PRN/intermittently Type of Home: House Home Access: Stairs to enter [employer] of Steps: 1 Home Layout: Two level,Able to live on main level with bedroom/bathroom Alternate Level  Stairs-Number of Steps: 13 Alternate Level Stairs-Rails: Right Bathroom Shower/Tub: Tub/shower unit,Curtain Bathroom Toilet: Handicapped height Bathroom Accessibility: Yes Home Equipment: Cane - single point,Walker - 4 wheels Additional Comments: Commode downstairs in handicap height and one upstairs is not.  Lives With: Alone   Functional History: Prior Function Level of Independence: Needs assistance Gait / Transfers Assistance Needed: Uses cane or Rollator as pain had gotten worse ADL's / Homemaking Assistance Needed: Pt reports being previously Modified Independent to Independent for ADLs/IADLs in the home. Pt was still driving, grocery shopping  Functional Status:  Mobility: Bed Mobility Overal bed mobility: Needs Assistance Bed Mobility: Sidelying to Sit,Sit to Sidelying Rolling: Min assist,Min guard Sidelying to sit: Mod assist Sit to sidelying: Mod assist General bed mobility comments: Instructed pt in log rolling. On entry, pt initially twisting spine rolling side to side in attempts to alleviate pain. Educated in best way to roll to side at min guard, Mod A to sit EOB with assistance for trunk and manuevering of LE out of bed. Pt returning to supine again for LB dressing. Difficulty sitting EOB initially due to pain but then able to sit >10 min Transfers Overall transfer level: Needs assistance Equipment  used: Rolling walker (2 wheeled),1 person hand held assist Transfers: Sit to/from Stand Sit to Stand: Mod assist General transfer comment: Min A for power up using RW, difficulty with safety manuevering RW and hand placement. Cues to keep spine straight, able to take a couple of steps to recliner Ambulation/Gait Ambulation/Gait assistance: Min assist Gait Distance (Feet): 10 Feet (5 x 2) Assistive device: Rolling walker (2 wheeled) Gait Pattern/deviations: Step-to pattern,Step-through pattern,Decreased stride length,Wide base of support General Gait Details: dense  repetitive cues for safety and set up both getting to chair and back to bed Gait velocity: reduced    ADL: ADL Overall ADL's : Needs assistance/impaired Eating/Feeding: Set up,Sitting Grooming: Set up,Sitting Upper Body Bathing: Minimal assistance,Sitting Lower Body Bathing: Minimal assistance,Sit to/from stand Upper Body Dressing : Set up,Sitting Upper Body Dressing Details (indicate cue type and reason): Setup to don clean hospital gown Lower Body Dressing: Minimal assistance,Sit to/from stand Lower Body Dressing Details (indicate cue type and reason): Attempted to guide pt in donning socks sitting EOB. However, pt with too much pain. Pt able to demo donning socks bed level bringing feet to self. Toilet Transfer: Minimal Wellsite geologist Details (indicate cue type and reason): Min A for manuevering of RW and cues for safety/spinal precautions Toileting- Clothing Manipulation and Hygiene: Minimal assistance,Sit to/from stand Functional mobility during ADLs: Minimal assistance,Rolling walker,Cueing for sequencing,Cueing for safety General ADL Comments: Pt limited by pain, poor recall of spinal precautions.  Cognition: Cognition Overall Cognitive Status: Impaired/Different from baseline Orientation Level: Oriented X4 Cognition Arousal/Alertness: Awake/alert Behavior During Therapy: WFL for tasks assessed/performed Overall Cognitive Status: Impaired/Different from baseline Area of Impairment: Problem solving,Awareness,Safety/judgement,Following commands,Memory,Attention,Orientation Orientation Level: Situation Current Attention Level: Selective Memory: Decreased recall of precautions Following Commands: Follows one step commands inconsistently,Follows one step commands with increased time Safety/Judgement: Decreased awareness of safety,Decreased awareness of deficits Awareness: Intellectual Problem Solving: Slow processing,Requires verbal cues General  Comments: Pt overall A&Ox4, a bit impulsive with movements secondary to pain and poor recall of back precautions. Follows commands with cues  Physical Exam: Blood pressure 120/61, pulse 94, temperature 97.8 F (36.6 C), temperature source Oral, resp. rate 16, height 5\' 3"  (1.6 m), weight 69.5 kg, SpO2 91 %. Physical Exam Skin:    Comments: Back incision is dressed clean and dry.  Neurological:     Comments: Patient is alert in no acute distress oriented x3 and follows commands.     Results for orders placed or performed during the hospital encounter of 05/01/20 (from the past 48 hour(s))  Protime-INR     Status: None   Collection Time: 05/06/20  1:37 AM  Result Value Ref Range   Prothrombin Time 15.2 11.4 - 15.2 seconds   INR 1.2 0.8 - 1.2    Comment: (NOTE) INR goal varies based on device and disease states. Performed at [facility], [place] [address], [place], [place] [address]   CBC with Differential/Platelet     Status: Abnormal   Collection Time: 05/06/20  1:37 AM  Result Value Ref Range   WBC 13.8 (H) 4.0 - 10.5 K/uL   RBC 4.28 3.87 - 5.11 MIL/uL   Hemoglobin 13.0 12.0 - 15.0 g/dL   HCT 38.3 36.0 - 46.0 %   MCV 89.5 80.0 - 100.0 fL   MCH 30.4 26.0 - 34.0 pg   MCHC 33.9 30.0 - 36.0 g/dL   RDW 16.1 (H) 11.5 - 15.5 %   Platelets 155 150 - 400 K/uL    Comment: REPEATED  TO VERIFY   nRBC 0.0 0.0 - 0.2 %   Neutrophils Relative % 83 %   Neutro Abs 11.5 (H) 1.7 - 7.7 K/uL   Lymphocytes Relative 6 %   Lymphs Abs 0.8 0.7 - 4.0 K/uL   Monocytes Relative 10 %   Monocytes Absolute 1.4 (H) 0.1 - 1.0 K/uL   Eosinophils Relative 0 %   Eosinophils Absolute 0.0 0.0 - 0.5 K/uL   Basophils Relative 0 %   Basophils Absolute 0.0 0.0 - 0.1 K/uL   Immature Granulocytes 1 %   Abs Immature Granulocytes 0.08 (H) 0.00 - 0.07 K/uL    Comment: Performed at [facility] [address], [place], [place] [address]  Comprehensive metabolic panel     Status: Abnormal   Collection  Time: 05/06/20  1:37 AM  Result Value Ref Range   Sodium 134 (L) 135 - 145 mmol/L   Potassium 3.9 3.5 - 5.1 mmol/L   Chloride 99 98 - 111 mmol/L   CO2 25 22 - 32 mmol/L   Glucose, Bld 117 (H) 70 - 99 mg/dL    Comment: Glucose reference range applies only to samples taken after fasting for at least 8 hours.   BUN 20 8 - 23 mg/dL   Creatinine, Ser 0.83 0.44 - 1.00 mg/dL   Calcium 8.2 (L) 8.9 - 10.3 mg/dL   Total Protein 5.1 (L) 6.5 - 8.1 g/dL   Albumin 2.6 (L) 3.5 - 5.0 g/dL   AST 32 15 - 41 U/L   ALT 26 0 - 44 U/L   Alkaline Phosphatase 59 38 - 126 U/L   Total Bilirubin 1.0 0.3 - 1.2 mg/dL   GFR, Estimated >60 >60 mL/min    Comment: (NOTE) Calculated using the CKD-EPI Creatinine Equation (2021)    Anion gap 10 5 - 15    Comment: Performed at [facility], [place] [address], [place], [place] [address]  Phosphorus     Status: None   Collection Time: 05/06/20  1:37 AM  Result Value Ref Range   Phosphorus 2.6 2.5 - 4.6 mg/dL    Comment: Performed at [facility] [address], [place], [place] [address]  Magnesium     Status: None   Collection Time: 05/06/20  1:37 AM  Result Value Ref Range   Magnesium 1.7 1.7 - 2.4 mg/dL    Comment: Performed at [facility] [address], [place], [place] [address]  Protime-INR     Status: None   Collection Time: 05/07/20  3:01 AM  Result Value Ref Range   Prothrombin Time 14.5 11.4 - 15.2 seconds   INR 1.2 0.8 - 1.2    Comment: (NOTE) INR goal varies based on device and disease states. Performed at [facility], [place] [address] [place],  [address]   CBC with Differential/Platelet     Status: Abnormal   Collection Time: 05/07/20  3:01 AM  Result Value Ref Range   WBC 11.4 (H) 4.0 - 10.5 K/uL   RBC 4.18 3.87 - 5.11 MIL/uL   Hemoglobin 12.6 12.0 - 15.0 g/dL   HCT 37.3 36.0 - 46.0 %   MCV 89.2 80.0 - 100.0 fL   MCH 30.1 26.0 - 34.0 pg   MCHC 33.8 30.0 - 36.0 g/dL   RDW 16.0 (H) 11.5 - 15.5 %   Platelets  200 150 - 400 K/uL   nRBC 0.0 0.0 - 0.2 %   Neutrophils Relative %  75 %   Neutro Abs 8.6 (H) 1.7 - 7.7 K/uL   Lymphocytes Relative 11 %   Lymphs Abs 1.2 0.7 - 4.0 K/uL   Monocytes Relative 11 %   Monocytes Absolute 1.3 (H) 0.1 - 1.0 K/uL   Eosinophils Relative 2 %   Eosinophils Absolute 0.2 0.0 - 0.5 K/uL   Basophils Relative 0 %   Basophils Absolute 0.0 0.0 - 0.1 K/uL   Immature Granulocytes 1 %   Abs Immature Granulocytes 0.07 0.00 - 0.07 K/uL    Comment: Performed at [facility] [address], [place], [place] [address]  Comprehensive metabolic panel     Status: Abnormal   Collection Time: 05/07/20  3:01 AM  Result Value Ref Range   Sodium 135 135 - 145 mmol/L   Potassium 4.3 3.5 - 5.1 mmol/L   Chloride 100 98 - 111 mmol/L   CO2 27 22 - 32 mmol/L   Glucose, Bld 112 (H) 70 - 99 mg/dL    Comment: Glucose reference range applies only to samples taken after fasting for at least 8 hours.   BUN 20 8 - 23 mg/dL   Creatinine, Ser 0.75 0.44 - 1.00 mg/dL   Calcium 8.0 (L) 8.9 - 10.3 mg/dL   Total Protein 5.1 (L) 6.5 - 8.1 g/dL   Albumin 2.4 (L) 3.5 - 5.0 g/dL   AST 23 15 - 41 U/L   ALT 21 0 - 44 U/L   Alkaline Phosphatase 53 38 - 126 U/L   Total Bilirubin 1.1 0.3 - 1.2 mg/dL   GFR, Estimated >60 >60 mL/min    Comment: (NOTE) Calculated using the CKD-EPI Creatinine Equation (2021)    Anion gap 8 5 - 15    Comment: Performed at [facility], [place] [address] [place], [place] [address]  Magnesium     Status: None   Collection Time: 05/07/20  3:01 AM  Result Value Ref Range   Magnesium 2.0 1.7 - 2.4 mg/dL    Comment: Performed at [facility] [address], [place], [place] [address]  Phosphorus     Status: Abnormal   Collection Time: 05/07/20  3:01 AM  Result Value Ref Range   Phosphorus 1.9 (L) 2.5 - 4.6 mg/dL    Comment: Performed at [facility] [address], [place], [place] [address]   No results found.     Medical Problem List and  Plan: 1.  Decreased functional mobility secondary to thoracic T11 compression fracture.  Status post T10-11 arthrodesis percutaneous pedicle screw 05/04/2020.  Back brace as directed  -patient may *** shower  -ELOS/Goals: *** 2.  Antithrombotics: -DVT/anticoagulation: Chronic Coumadin  -antiplatelet therapy: N/A 3. Pain Management: OxyContin 10 mg every 12 hours, hydrocodone as needed, Valium as needed muscle spasms.  Monitor mental status 4. Mood: Provide emotional support  -antipsychotic agents: N/A 5. Neuropsych: This patient is capable of making decisions on her own behalf. 6. Skin/Wound Care: Routine skin checks 7. Fluids/Electrolytes/Nutrition: Routine in and outs with follow-up chemistries 8.  E. coli UTI.  Completed course of Keflex 9.  Hypertension.  Cozaar 50 mg daily.  Monitor with increased mobility 10.  Atrial fibrillation.  Coumadin as directed.  Cardiac rate controlled 11.  Restless leg syndrome.  Requip 0.25 mg twice daily as needed 12.  Constipation.  Senokot-S twice daily, MiraLAX twice daily, Dulcolax tablet/ suppository daily as needed  ***  Cathlyn Parsons, PA-C 05/07/2020

## 2020-05-07 NOTE — Progress Notes (Signed)
PT Cancellation Note  Patient Details Name: ZEEVA COURSER MRN: [id number] DOB: 28-May-1927   Cancelled Treatment:    Reason Eval/Treat Not Completed: (P) Other (comment) (pt working with OT earlier in day, then sleeping deeply when checked in PM.) Will continue efforts per PT POC as schedule permits.   Dorathy Kinsman Ollie Esty 05/07/2020, 4:26 PM

## 2020-05-07 NOTE — Progress Notes (Signed)
PROGRESS NOTE    Danielle Thomas  [id number] DOB: 1928/05/06 DOA: 05/01/2020 PCP: Cari Caraway, MD  Brief Narrative:  HPI per Dr. Zada Finders on 05/01/20 Danielle Thomas is a 86 y.o. female with medical history significant for atrial fibrillation on Coumadin, hypertension, restless leg syndrome who presents to the ED for evaluation of uncontrolled back pain.  Patient was recently admitted from 03/27/2020-03/29/2020 for multiple electrolyte abnormalities.  She was also found to have a T11 compression fracture which was managed with pain medication and TLSO brace.  She was discharged to SNF with plan for outpatient follow-up.  Patient states she was in SNF for 2 weeks before returning home.  She says that since she has been in the hospital she has been essentially bedbound due to difficulty ambulating secondary to continued back pain.  She says previously she would ambulate with the use of a walker.  She currently lives alone.  She otherwise denies any focal deficits or new weakness in her extremities.  ED Course:  Initial vitals showed BP 180/92, pulse 83, RR 16, temp 97.8 F, SPO2 97% on room air.  Labs show sodium 134, potassium 2.9, bicarb 30, BUN 22, creatinine 0.90, serum glucose 130, WBC 11.3, hemoglobin 16.9, platelets 232,000, INR 2.8.  SARS-CoV-2 PCR panel is ordered and pending.  Right rib x-rays negative for displaced rib fracture or other acute abnormality of the right ribs.  No pneumothorax or pleural effusion seen.  MRI thoracic and lumbar spine without contrast showed progressed T11 compression fracture with loss of height increased from 20% to 40%.  New retropulsion of bone resulting in mild spinal stenosis with mild cord mass-effect but no spinal cord signal abnormality also noted.  Acute to subacute posterior left rib fractures 9 through 11 are seen.  No acute osseous abnormality in the lumbar spine seen.  Patient was given IV morphine 6 mg and 4 mg, IV  Dilaudid 0.5 mg, K 40 mEq orally.  EDP discussed with on-call neurosurgery, Dr. Christella Noa who recommended medical admission for further neurosurgery evaluation and potential kyphoplasty after the weekend.  The hospitalist service was consulted to admit for further evaluation management.  **Interim History  Patient underwent surgical intervention on 05/04/2020 by Dr. Christella Noa.  She is postoperative day 3 and still complains of some moderate back pain.  She states that her appetite is diminished.  Will need PT OT to evaluate for safe discharge disposition and they evaluated today and they are recommending CIR and 24-hour supervision and SNF if she is unable to go to CIR.  Will consult CIR for evaluation. Neurosurgery recommending continuing brace while ambulating and continuing pain control. After discussion with Neurosurgery Dr. Christella Noa recommends resuming Anticoagulation today (48 hours after surgery). We will change Abx to po and stop after 7 days.   Assessment & Plan:   Principal Problem:   Compression fracture of T11 vertebra (HCC) Active Problems:   Atrial fibrillation (HCC)   Essential hypertension, benign   Hypokalemia   Multiple fractures of ribs, left side, initial encounter for closed fracture   Closed wedge compression fracture of T11 vertebra (HCC)  Uncontrolled back pain due to progressed T11 compression fracture s/p thoracic 10-11 arthrodesis, allograft morselized, BMP, thoracic 9 thoracic 12 percutaneous pedicle screws postoperative day 3  -Continue TLSO brace, pain control.   -Patient has a known T11 compression fracture and following with orthopedics as an outpatient.   -MRI done here showed progression of the compression fracture, increased loss of height. It also  showed new retropulsion of bone resulting in mild spinal stenosis with mild cord mass effect but no spinal cord signal abnormality  -Dr. Christella Noa, Neurosurgery was consulted and planning on percutaneous pedicle screws and  a T10 to T12 arthrodesis today and she is POD3. -Consult  PT/OT after procedure and they are recommending CIR; CIR consulted for further evaluation and awaiting disposition -Added Robaxin and will continue with IV every 6 hours as needed. -Pain control with cyclobenzaprine 10 mg p.o. 3 times daily as needed muscle spasms, hydrocodone-acetaminophen 1-2 tabs p.o. every 4 hours as needed for moderate pain, methocarbamol 500 g IV every 6 hours as needed for muscle spasms as well, oxycodone 10 mg every 12 hours scheduled -Continue with bowel regimen -Patient continues to have moderate back pain but neurosurgery feels that she is doing well and they are recommending continuing the brace while she is ambulating  Atrial Fibrillation -On Coumadin as an outpatient.  INR 2.8 and trended up to 4.0 and is now 1.2 again today.   -Hold Coumadin for now pending potential kyphoplasty.  On Coumadin for anticoagulation.   -Since INR was 4,neurosugery ordered oral Vitamin K for surgery.  After vitamin K INR is trended down to 1.2 and PT is 14.5 -We will need to resume anticoagulation when okayed by neurosurgery and Dr. Christella Noa recommending resuming Anticoagulation today   Acute to subacute left 9-11 rib fractures: -Seen on MRI.  Continue pain control as above and supportive Care -Incentive spirometer. -She may have fallen at home -Continue to Monitor carefully   Hypertension -Continue losartan.  HCTZ was on hold due to hypokalemia but was resumed yesterday at 10 AM Been she is continuing hydrochlorothiazide 12.5 g daily and losartan 50 mg daily -Continue to monitor blood pressures per protocol -Last blood pressure reading was elevated at 124/54 -We will add hydralazine 10 mg IV every 6 for systolic blood pressure greater than 0000000 or diastolic blood pressure and 90  Hypokalemia -Improved and potassium is now 3.9 -Magnesium level still pending today as well -IVF is now discontinued  -Continue monitor and  replete as necessary -Repeat CMP in a.m.  Hypophosphatemia -Patient's Phos Level was 1.9 -Replete with po KPhos Neutral 500 mg BID x2 -Continue to Monitor and Trend  -Repeat CMP in the AM  Hyponatremia -Mild -Patient's sodium had dropped from 137 -> 133 -> 134 and is now back up to 135 -continue IV fluid hydration as above -Continue to monitor and trend and repeat CMP in a.m.  Restless Leg Syndrome -Continue Ropinirole 0.25 mg p.o. twice daily as needed for restless legs.  Glaucoma -Continue Dorzolamide 1 drop both eyes twice daily as well as latanoprost 1 drop in both eyes nightly  Leukocytosis in the setting of fall and E. coli UTI, poA and now improving  E Coli UTI, poA -She is Afebrile; WBC went from 11.3 -> 12.0 -> 10.8 and down to 10.0 but is now 14.5 and in the setting of her surgical intervention; Now improving and is 13.8 yesterday and is 11.4 today -U/A Showed turbid appearance with amber color urine, large hemoglobin, moderate leukocytes, negative nitrites, many bacteria, 0-5 non-squamous epithelial cells, greater than 50 RBCs per high-power field, 0-5 squamous epithelial cells, and greater than 50 WBCs  -Urine culture was obtained and was positive for E. coli as pansensitive -Started on IV Ceftrixaone and will continue for at least 5 days and transition to po and stop after 7 days total; Transitioned to po Keflex yesterday   SMA  and celiac trunk stenosis 70% stenosis involving the origin of SMA and celiac trunk.  -No signs of bowel ischemia. -Need ambulatory referral to vascular surgery   FTT -Patient report was previously independent prior to hospitalization in November -However she is not able to get out of bed due to pain currently -Nutritionist consulted and recommending Ensure Enlive po BID -Will need PT OT eval once pain improves and she is now being recommended for CIR  DVT prophylaxis: SCDs; Will resume Anticoagulation today  Code Status: Partial   Family Communication: Discussed with son at bedside Disposition Plan: PT and OT evaluation recommending CIR vs SNF and clearance by neurosurgery  Status is: Inpatient  Remains inpatient appropriate because:Unsafe d/c plan, IV treatments appropriate due to intensity of illness or inability to take PO and Inpatient level of care appropriate due to severity of illness   Dispo: The patient is from: Home              Anticipated d/c is to: SNF              Anticipated d/c date is: 2 days              Patient currently is not medically stable to d/c.  Consultants:   Neurosurgery Dr. Christella Noa  CIR   Procedures:  PROCEDURE by Dr. Ashok Pall:  Procedure(s): thoracic 10-11 arthrodesis, allograft morsels, bmp Thoracic nine to Thoracic twelve percutaneous pedicle screws For segmental pedicle screw fixation  Antimicrobials:  Anti-infectives (From admission, onward)   Start     Dose/Rate Route Frequency Ordered Stop   05/06/20 2100  cephALEXin (KEFLEX) capsule 250 mg        250 mg Oral Every 6 hours 05/06/20 1350 05/09/20 2359   05/04/20 0615  ceFAZolin (ANCEF) IVPB 2g/100 mL premix        2 g 200 mL/hr over 30 Minutes Intravenous On call to O.R. 05/04/20 0517 05/04/20 1008   05/02/20 2200  cefTRIAXone (ROCEPHIN) 1 g in sodium chloride 0.9 % 100 mL IVPB  Status:  Discontinued        1 g 200 mL/hr over 30 Minutes Intravenous Every 24 hours 05/02/20 1919 05/06/20 1350   05/02/20 2015  cefTRIAXone (ROCEPHIN) 1 g in sodium chloride 0.9 % 100 mL IVPB  Status:  Discontinued        1 g 200 mL/hr over 30 Minutes Intravenous Every 24 hours 05/02/20 1918 05/02/20 1919        Subjective: Seen and examined at bedside and still states that she is not feeling good at all but appears to be comfortable.  States that she was nauseous last night but not now.  Denies any chest pain, lightheadedness but still continues have back pain.  No other concerns or complaints at this time.  Objective: Vitals:    05/06/20 2023 05/07/20 0346 05/07/20 0634 05/07/20 0725  BP: (!) 113/48 120/61  132/84  Pulse: 86 94  99  Resp: 20 16  16   Temp: 98.8 F (37.1 C) 97.8 F (36.6 C)  (!) 97.4 F (36.3 C)  TempSrc: Oral Oral  Oral  SpO2: 95% 91%  97%  Weight:   73.1 kg   Height:        Intake/Output Summary (Last 24 hours) at 05/07/2020 0858 Last data filed at 05/07/2020 0600 Gross per 24 hour  Intake 240 ml  Output 550 ml  Net -310 ml   Filed Weights   05/02/20 0500 05/03/20 0529 05/07/20 0634  Weight:  68.5 kg 69.5 kg 73.1 kg   Examination: Physical Exam:  Constitutional: WN/WD elderly Caucasian female currently in no acute distress but complains of not feeling well but does not elaborate and does appear mildly anxious Eyes: Lids and conjunctivae normal, sclerae anicteric  ENMT: External Ears, Nose appear normal. Grossly normal hearing. Neck: Appears normal, supple, no cervical masses, normal ROM, no appreciable thyromegaly; no JVD Respiratory: Diminished to auscultation bilaterally, no wheezing, rales, rhonchi or crackles. Normal respiratory effort and patient is not tachypenic. No accessory muscle use.  Unlabored breathing Cardiovascular: RRR, no murmurs / rubs / gallops. S1 and S2 auscultated.  Minimal extremity edema Abdomen: Soft, non-tender, non-distended. Bowel sounds positive.  GU: Deferred. Musculoskeletal: No clubbing / cyanosis of digits/nails. No joint deformity upper and lower extremities.  Skin: No rashes, lesions, ulcers on limited skin evaluation. No induration; Warm and dry.  Neurologic: CN 2-12 grossly intact with no focal deficits. Romberg sign and cerebellar reflexes not assessed.  Psychiatric: Normal judgment and insight. Alert and oriented x 3.  Appears slightly anxious mood and appropriate affect.   Data Reviewed: I have personally reviewed following labs and imaging studies  CBC: Recent Labs  Lab 05/03/20 0201 05/04/20 0550 05/05/20 0212 05/06/20 0137  05/07/20 0301  WBC 10.8* 10.0 14.5* 13.8* 11.4*  NEUTROABS 8.3* 7.3 12.7* 11.5* 8.6*  HGB 16.2* 16.6* 14.5 13.0 12.6  HCT 49.3* 51.8* 42.3 38.3 37.3  MCV 92.3 92.3 89.6 89.5 89.2  PLT 231 198 216 155 200   Basic Metabolic Panel: Recent Labs  Lab 05/01/20 2131 05/02/20 0236 05/03/20 0201 05/04/20 0550 05/05/20 0212 05/06/20 0137 05/07/20 0301  NA  --    < > 134* 137 133* 134* 135  K  --    < > 3.9 4.1 5.1 3.9 4.3  CL  --    < > 94* 96* 99 99 100  CO2  --    < > 30 30 23 25 27   GLUCOSE  --    < > 105* 98 137* 117* 112*  BUN  --    < > 28* 23 19 20 20   CREATININE  --    < > 0.93 1.01* 0.87 0.83 0.75  CALCIUM  --    < > 8.8* 8.9 8.3* 8.2* 8.0*  MG 1.9  --   --  1.9 1.7 1.7 2.0  PHOS  --   --   --  3.3 2.8 2.6 1.9*   < > = values in this interval not displayed.   GFR: Estimated Creatinine Clearance: 44.8 mL/min (by C-G formula based on SCr of 0.75 mg/dL). Liver Function Tests: Recent Labs  Lab 05/04/20 0550 05/05/20 0212 05/06/20 0137 05/07/20 0301  AST 28 41 32 23  ALT 35 30 26 21   ALKPHOS 74 67 59 53  BILITOT 0.8 1.1 1.0 1.1  PROT 5.8* 5.2* 5.1* 5.1*  ALBUMIN 3.0* 2.8* 2.6* 2.4*   No results for input(s): LIPASE, AMYLASE in the last 168 hours. No results for input(s): AMMONIA in the last 168 hours. Coagulation Profile: Recent Labs  Lab 05/04/20 0303 05/04/20 0840 05/05/20 0212 05/06/20 0137 05/07/20 0301  INR 2.1* 1.5* 1.2 1.2 1.2   Cardiac Enzymes: No results for input(s): CKTOTAL, CKMB, CKMBINDEX, TROPONINI in the last 168 hours. BNP (last 3 results) No results for input(s): PROBNP in the last 8760 hours. HbA1C: No results for input(s): HGBA1C in the last 72 hours. CBG: No results for input(s): GLUCAP in the last 168 hours. Lipid Profile:  No results for input(s): CHOL, HDL, LDLCALC, TRIG, CHOLHDL, LDLDIRECT in the last 72 hours. Thyroid Function Tests: No results for input(s): TSH, T4TOTAL, FREET4, T3FREE, THYROIDAB in the last 72 hours. Anemia  Panel: No results for input(s): VITAMINB12, FOLATE, FERRITIN, TIBC, IRON, RETICCTPCT in the last 72 hours. Sepsis Labs: No results for input(s): PROCALCITON, LATICACIDVEN in the last 168 hours.  Recent Results (from the past 240 hour(s))  Resp Panel by RT-PCR (Flu A&B, Covid) Nasopharyngeal Swab     Status: None   Collection Time: 05/01/20  6:23 PM   Specimen: Nasopharyngeal Swab; Nasopharyngeal(NP) swabs in vial transport medium  Result Value Ref Range Status   SARS Coronavirus 2 by RT PCR NEGATIVE NEGATIVE Final    Comment: (NOTE) SARS-CoV-2 target nucleic acids are NOT DETECTED.  The SARS-CoV-2 RNA is generally detectable in upper respiratory specimens during the acute phase of infection. The lowest concentration of SARS-CoV-2 viral copies this assay can detect is 138 copies/mL. A negative result does not preclude SARS-Cov-2 infection and should not be used as the sole basis for treatment or other patient management decisions. A negative result may occur with  improper specimen collection/handling, submission of specimen other than nasopharyngeal swab, presence of viral mutation(s) within the areas targeted by this assay, and inadequate number of viral copies(<138 copies/mL). A negative result must be combined with clinical observations, patient history, and epidemiological information. The expected result is Negative.  Fact Sheet for Patients:  EntrepreneurPulse.com.au  Fact Sheet for Healthcare Providers:  IncredibleEmployment.be  This test is no t yet approved or cleared by the [place] FDA and  has been authorized for detection and/or diagnosis of SARS-CoV-2 by FDA under an Emergency Use Authorization (EUA). This EUA will remain  in effect (meaning this test can be used) for the duration of the COVID-19 declaration under Section 564(b)(1) of the Act, 21 U.S.C.section 360bbb-3(b)(1), unless the authorization is terminated  or  revoked sooner.       Influenza A by PCR NEGATIVE NEGATIVE Final   Influenza B by PCR NEGATIVE NEGATIVE Final    Comment: (NOTE) The Xpert Xpress SARS-CoV-2/FLU/RSV plus assay is intended as an aid in the diagnosis of influenza from Nasopharyngeal swab specimens and should not be used as a sole basis for treatment. Nasal washings and aspirates are unacceptable for Xpert Xpress SARS-CoV-2/FLU/RSV testing.  Fact Sheet for Patients: EntrepreneurPulse.com.au  Fact Sheet for Healthcare Providers: IncredibleEmployment.be  This test is not yet approved or cleared by the [place] FDA and has been authorized for detection and/or diagnosis of SARS-CoV-2 by FDA under an Emergency Use Authorization (EUA). This EUA will remain in effect (meaning this test can be used) for the duration of the COVID-19 declaration under Section 564(b)(1) of the Act, 21 U.S.C. section 360bbb-3(b)(1), unless the authorization is terminated or revoked.  Performed at [facility], [place] [address] [place], [place] [address]   Culture, Urine     Status: Abnormal   Collection Time: 05/02/20  7:18 PM   Specimen: Urine, Catheterized  Result Value Ref Range Status   Specimen Description   Final    URINE, CATHETERIZED Performed at [facility] [address] [place], [place] [address]    Special Requests   Final    NONE Performed at [facility], [place] [address] [place], [place] [address]    Culture >=100,000 COLONIES/mL ESCHERICHIA COLI (A)  Final   Report Status 05/04/2020 FINAL  Final   Organism ID, Bacteria ESCHERICHIA  COLI (A)  Final      Susceptibility   Escherichia coli - MIC*    AMPICILLIN <=2 SENSITIVE Sensitive     CEFAZOLIN <=4 SENSITIVE Sensitive     CEFEPIME <=0.12 SENSITIVE Sensitive     CEFTRIAXONE <=0.25 SENSITIVE Sensitive     CIPROFLOXACIN <=0.25 SENSITIVE Sensitive     GENTAMICIN <=1  SENSITIVE Sensitive     IMIPENEM <=0.25 SENSITIVE Sensitive     NITROFURANTOIN <=16 SENSITIVE Sensitive     TRIMETH/SULFA <=20 SENSITIVE Sensitive     AMPICILLIN/SULBACTAM <=2 SENSITIVE Sensitive     PIP/TAZO <=4 SENSITIVE Sensitive     * >=100,000 COLONIES/mL ESCHERICHIA COLI  Surgical pcr screen     Status: Abnormal   Collection Time: 05/04/20  5:37 AM   Specimen: Nasal Mucosa; Nasal Swab  Result Value Ref Range Status   MRSA, PCR NEGATIVE NEGATIVE Final   Staphylococcus aureus POSITIVE (A) NEGATIVE Final    Comment: (NOTE) The Xpert SA Assay (FDA approved for NASAL specimens in patients 93 years of age and older), is one component of a comprehensive surveillance program. It is not intended to diagnose infection nor to guide or monitor treatment. Performed at [facility], [place] [address], [place], [place] [address]      RN Pressure Injury Documentation:     Estimated body mass index is 28.55 kg/m as calculated from the following:   Height as of this encounter: 5\' 3"  (1.6 m).   Weight as of this encounter: 73.1 kg.  Malnutrition Type:  Nutrition Problem: Inadequate oral intake Etiology: poor appetite   Malnutrition Characteristics:  Signs/Symptoms: per patient/family report   Nutrition Interventions:  Interventions: Ensure Enlive (each supplement provides 350kcal and 20 grams of protein)     Radiology Studies: No results found. Scheduled Meds: . cephALEXin  250 mg Oral Q6H  . Chlorhexidine Gluconate Cloth  6 each Topical Q0600  . dorzolamide  1 drop Both Eyes BID  . feeding supplement  237 mL Oral BID BM  . latanoprost  1 drop Both Eyes QHS  . losartan  50 mg Oral Daily  . mupirocin ointment  1 application Nasal BID  . oxyCODONE  10 mg Oral Q12H  . phosphorus  500 mg Oral BID  . senna-docusate  1 tablet Oral BID  . sodium chloride flush  3 mL Intravenous Q12H  . Warfarin - Pharmacist Dosing Inpatient   Does not apply q1600   Continuous  Infusions: . sodium chloride Stopped (05/02/20 2103)  . sodium chloride      LOS: 6 days   Kerney Elbe, DO [facility] PAGER is on [place]  If 7PM-7AM, please contact night-coverage [email or web address]

## 2020-05-07 NOTE — Plan of Care (Signed)

## 2020-05-07 NOTE — Progress Notes (Signed)
Occupational Therapy Treatment Patient Details Name: Danielle Thomas MRN: [id number] DOB: 12/20/1927 Today's Date: 05/07/2020    History of present illness Danielle Thomas is a 86 y.o. female with medical history significant for atrial fibrillation on Coumadin, hypertension, restless leg syndrome who presents to the ED for evaluation of uncontrolled back pain.    She says that since she has been in the hospital she has been essentially bedbound due to difficulty ambulating secondary to continued back pain. Patient was recently admitted from 03/27/2020-03/29/2020 for multiple electrolyte abnormalities.  She was also found to have a T11 compression fracture which was managed with pain medication and TLSO brace.  She was discharged to SNF with plan for outpatient follow-up   OT comments  Pt progressing gradually towards OT goals, noted with anxiety and hyperfocused on pain during session. Pt impulsively standing throughout session in attempts to alleviate pain. Son present and assisting throughout session. Overall, pt min guard for sit to stands and transfer to/from [facility] with RW, frequent cues for safety and back precautions. Pt Supervision for bed mobility with repetition and trials to improve carryover of spinal precautions. Provided back precautions handout with plans to further educate during ADLs during next session. Plan to also re-assess DC recommendations during next session as pain/anxiety limiting factor today.    Follow Up Recommendations  CIR;Supervision/Assistance - 24 hour;Other (comment) (SNF if unable to go to CIR)    Equipment Recommendations  3 in 1 bedside commode;Other (comment) (RW)    Recommendations for Other Services Rehab consult    Precautions / Restrictions Precautions Precautions: Fall;Back Precaution Booklet Issued: Yes (comment) Precaution Comments: LSO in room, newest order for LSO brace. Pt previously wearing TLSO prior to admission Required Braces or Orthoses:  Spinal Brace Spinal Brace: Lumbar corset Restrictions Weight Bearing Restrictions: No       Mobility Bed Mobility Overal bed mobility: Needs Assistance Bed Mobility: Sit to Sidelying;Rolling Rolling: Supervision       Sit to sidelying: Supervision General bed mobility comments: Frequent cues needed for spinal precautions with son assisting in guiding pt for rolling without twisting trial. reinforcement needed  Transfers Overall transfer level: Needs assistance Equipment used: Rolling walker (2 wheeled);1 person hand held assist Transfers: Sit to/from [employer] Sit to Stand: Min guard Stand pivot transfers: Min guard       General transfer comment: min guard for safety, cueing for back precautions    Balance Overall balance assessment: Needs assistance Sitting-balance support: Bilateral upper extremity supported;Feet supported Sitting balance-Leahy Scale: Fair Sitting balance - Comments: reliant on B UE support sitting EOB due to pain     Standing balance-Leahy Scale: Poor Standing balance comment: reliant on UE support for dynamic tasks                           ADL either performed or assessed with clinical judgement   ADL Overall ADL's : Needs assistance/impaired                         Toilet Transfer: Min Probation officer Details (indicate cue type and reason): cues for safety and pacing Toileting- Clothing Manipulation and Hygiene: Moderate assistance;Sit to/from stand Toileting - Clothing Manipulation Details (indicate cue type and reason): Mod A for mgmt of brief, pt hyperfocused on pain.       General ADL Comments: limited by pain and anxiety     Vision  Vision Assessment?: No apparent visual deficits   Perception     Praxis      Cognition Arousal/Alertness: Awake/alert Behavior During Therapy: Anxious;Impulsive Overall Cognitive Status: Impaired/Different from baseline Area of  Impairment: Problem solving;Awareness;Safety/judgement;Following commands;Memory;Attention;Orientation                 Orientation Level: Situation Current Attention Level: Selective Memory: Decreased recall of precautions Following Commands: Follows one step commands inconsistently;Follows one step commands with increased time Safety/Judgement: Decreased awareness of safety;Decreased awareness of deficits Awareness: Emergent Problem Solving: Slow processing;Requires verbal cues General Comments: Pt anxious throughout, impuslive with movements and perserverating on returning to bed. difficult to redirect        Exercises     Shoulder Instructions       General Comments Son present during session and engaged. Provided back precaution handout with pt/son to look over    Pertinent Vitals/ Pain       Pain Assessment: Faces Faces Pain Scale: Hurts even more Pain Location: low back from sitting Pain Descriptors / Indicators: Grimacing;Guarding;Operative site guarding Pain Intervention(s): Monitored during session;Premedicated before session;Repositioned;Relaxation  Home Living                                          Prior Functioning/Environment              Frequency  Min 2X/week        Progress Toward Goals  OT Goals(current goals can now be found in the care plan section)  Progress towards OT goals: Progressing toward goals  Acute Rehab OT Goals Patient Stated Goal: pain control OT Goal Formulation: With patient Time For Goal Achievement: 05/19/20 Potential to Achieve Goals: Good ADL Goals Pt Will Perform Grooming: with modified independence;standing Pt Will Perform Lower Body Bathing: with modified independence;sit to/from stand;with adaptive equipment Pt Will Perform Lower Body Dressing: with modified independence;with adaptive equipment;sit to/from stand;bed level Pt Will Transfer to Toilet: with modified  independence;ambulating;bedside commode Pt Will Perform Toileting - Clothing Manipulation and hygiene: with modified independence;sit to/from stand Additional ADL Goal #1: Pt to verbalize 3/3 back precautions Independently  Plan Discharge plan remains appropriate    Co-evaluation                 AM-PAC OT "6 Clicks" Daily Activity     Outcome Measure   Help from another person eating meals?: A Little Help from another person taking care of personal grooming?: A Little Help from another person toileting, which includes using toliet, bedpan, or urinal?: A Lot Help from another person bathing (including washing, rinsing, drying)?: A Little Help from another person to put on and taking off regular upper body clothing?: A Little Help from another person to put on and taking off regular lower body clothing?: A Little 6 Click Score: 17    End of Session Equipment Utilized During Treatment: Rolling walker;Back brace  OT Visit Diagnosis: Unsteadiness on feet (R26.81);Other abnormalities of gait and mobility (R26.89);Muscle weakness (generalized) (M62.81);Pain Pain - part of body:  (back)   Activity Tolerance Patient limited by pain;Other (comment) (limited by anxiety)   Patient Left in bed;with call bell/phone within reach;with bed alarm set;with family/visitor present   Nurse Communication Mobility status;Patient requests pain meds        Time: 1236-1300 OT Time Calculation (min): 24 min  Charges: OT General Charges $OT Visit: 1 Visit OT Treatments $  Self Care/Home Management : 23-37 mins  Layla Maw, OTR/L   Layla Maw 05/07/2020, 1:18 PM

## 2020-05-08 DIAGNOSIS — I480 Paroxysmal atrial fibrillation: Secondary | ICD-10-CM | POA: Diagnosis not present

## 2020-05-08 DIAGNOSIS — I10 Essential (primary) hypertension: Secondary | ICD-10-CM | POA: Diagnosis not present

## 2020-05-08 DIAGNOSIS — E876 Hypokalemia: Secondary | ICD-10-CM | POA: Diagnosis not present

## 2020-05-08 DIAGNOSIS — S22080A Wedge compression fracture of T11-T12 vertebra, initial encounter for closed fracture: Secondary | ICD-10-CM | POA: Diagnosis not present

## 2020-05-08 LAB — MAGNESIUM: Magnesium: 1.8 mg/dL (ref 1.7–2.4)

## 2020-05-08 LAB — COMPREHENSIVE METABOLIC PANEL
ALT: 20 U/L (ref 0–44)
AST: 19 U/L (ref 15–41)
Albumin: 2.3 g/dL — ABNORMAL LOW (ref 3.5–5.0)
Alkaline Phosphatase: 44 U/L (ref 38–126)
Anion gap: 8 (ref 5–15)
BUN: 16 mg/dL (ref 8–23)
CO2: 26 mmol/L (ref 22–32)
Calcium: 7.8 mg/dL — ABNORMAL LOW (ref 8.9–10.3)
Chloride: 101 mmol/L (ref 98–111)
Creatinine, Ser: 0.7 mg/dL (ref 0.44–1.00)
GFR, Estimated: >60 mL/min (ref >60)
Glucose, Bld: 101 mg/dL — ABNORMAL HIGH (ref 70–99)
Potassium: 3.6 mmol/L (ref 3.5–5.1)
Sodium: 135 mmol/L (ref 135–145)
Total Bilirubin: 0.8 mg/dL (ref 0.3–1.2)
Total Protein: 4.8 g/dL — ABNORMAL LOW (ref 6.5–8.1)

## 2020-05-08 LAB — CBC WITH DIFFERENTIAL/PLATELET
Abs Immature Granulocytes: 0.08 10*3/uL — ABNORMAL HIGH (ref 0.00–0.07)
Basophils Absolute: 0 10*3/uL (ref 0.0–0.1)
Basophils Relative: 0 %
Eosinophils Absolute: 0.2 10*3/uL (ref 0.0–0.5)
Eosinophils Relative: 3 %
HCT: 38.3 % (ref 36.0–46.0)
Hemoglobin: 12.3 g/dL (ref 12.0–15.0)
Immature Granulocytes: 1 %
Lymphocytes Relative: 11 %
Lymphs Abs: 1 10*3/uL (ref 0.7–4.0)
MCH: 29.4 pg (ref 26.0–34.0)
MCHC: 32.1 g/dL (ref 30.0–36.0)
MCV: 91.6 fL (ref 80.0–100.0)
Monocytes Absolute: 0.9 10*3/uL (ref 0.1–1.0)
Monocytes Relative: 10 %
Neutro Abs: 6.9 10*3/uL (ref 1.7–7.7)
Neutrophils Relative %: 75 %
Platelets: 193 10*3/uL (ref 150–400)
RBC: 4.18 MIL/uL (ref 3.87–5.11)
RDW: 15.9 % — ABNORMAL HIGH (ref 11.5–15.5)
WBC: 9.1 10*3/uL (ref 4.0–10.5)
nRBC: 0 % (ref 0.0–0.2)

## 2020-05-08 LAB — PHOSPHORUS: Phosphorus: 3 mg/dL (ref 2.5–4.6)

## 2020-05-08 LAB — PROTIME-INR
INR: 1.2 (ref 0.8–1.2)
Prothrombin Time: 15.1 seconds (ref 11.4–15.2)

## 2020-05-08 MED ORDER — POTASSIUM CHLORIDE CRYS ER 20 MEQ PO TBCR
40.0000 meq | EXTENDED_RELEASE_TABLET | Freq: Once | ORAL | Status: AC
  Administered 2020-05-08: 40 meq via ORAL
  Filled 2020-05-08: qty 2

## 2020-05-08 MED ORDER — MAGNESIUM SULFATE 2 GM/50ML IV SOLN
2.0000 g | Freq: Once | INTRAVENOUS | Status: AC
  Administered 2020-05-08: 2 g via INTRAVENOUS
  Filled 2020-05-08: qty 50

## 2020-05-08 MED ORDER — WARFARIN SODIUM 3 MG PO TABS
3.0000 mg | ORAL_TABLET | Freq: Once | ORAL | Status: AC
  Administered 2020-05-08: 3 mg via ORAL
  Filled 2020-05-08: qty 1

## 2020-05-08 NOTE — Progress Notes (Signed)
ANTICOAGULATION CONSULT NOTE - Follow Up Consult  Pharmacy Consult for Warfarin Indication: atrial fibrillation  Allergies  Allergen Reactions  . Amlodipine Besylate     Other reaction(s): high dose make feet swell  . Codeine Nausea And Vomiting  . Meperidine Nausea Only  . Metoprolol Succinate [Metoprolol]     Other reaction(s): effects her breathing  . Oxycodone Nausea And Vomiting    Patient Measurements: Height: 5\' 3"  (160 cm) Weight: 73.1 kg (161 lb 2.5 oz) IBW/kg (Calculated) : 52.4   Vital Signs: Temp: 98.3 F (36.8 C) (12/31 2103) BP: 157/86 (01/01 0421) Pulse Rate: 88 (01/01 0421)  Labs: Recent Labs    05/06/20 0137 05/07/20 0301 05/08/20 0255  HGB 13.0 12.6 12.3  HCT 38.3 37.3 38.3  PLT 155 200 193  LABPROT 15.2 14.5 15.1  INR 1.2 1.2 1.2  CREATININE 0.83 0.75 0.70    Estimated Creatinine Clearance: 44.8 mL/min (by C-G formula based on SCr of 0.7 mg/dL).   Medical History: Past Medical History:  Diagnosis Date  . Hypertension     Medications:  Medications Prior to Admission  Medication Sig Dispense Refill Last Dose  . dorzolamide (TRUSOPT) 2 % ophthalmic solution Place 1 drop into both eyes 2 (two) times daily.    05/01/2020 at Unknown time  . hydrochlorothiazide (MICROZIDE) 12.5 MG capsule Take 1 capsule (12.5 mg total) by mouth daily. *Please call and schedule a one year follow up appointment* (Patient taking differently: Take 12.5 mg by mouth daily.) 30 capsule 0 05/01/2020 at Unknown time  . losartan (COZAAR) 50 MG tablet Take 50 mg by mouth daily.   05/01/2020 at Unknown time  . methocarbamol (ROBAXIN) 500 MG tablet Take 250 mg by mouth daily as needed for muscle spasms.   05/01/2020 at Unknown time  . oxyCODONE-acetaminophen (PERCOCET/ROXICET) 5-325 MG tablet Take 1 tablet by mouth every 4 (four) hours as needed for severe pain.   05/01/2020 at Unknown time  . rOPINIRole (REQUIP) 0.25 MG tablet Take 0.25 mg by mouth 2 (two) times daily as  needed (restless legs). Restless leg   04/30/2020 at Unknown time  . travoprost, benzalkonium, (TRAVATAN) 0.004 % ophthalmic solution Place 1 drop into both eyes at bedtime.    04/30/2020 at Unknown time  . warfarin (COUMADIN) 1 MG tablet Take 1 mg by mouth every other day.   04/29/2020 at 7 pm  . warfarin (COUMADIN) 3 MG tablet Take 1.5 mg by mouth every other day.   04/30/2020 at 7 pm  . amLODipine (NORVASC) 10 MG tablet Take 1 tablet (10 mg total) by mouth daily. (Patient not taking: Reported on 05/01/2020)   Not Taking at Unknown time  . polyethylene glycol (MIRALAX / GLYCOLAX) 17 g packet Take 17 g by mouth 2 (two) times daily as needed for mild constipation (use first). (Patient not taking: No sig reported) 14 each 0 Not Taking at Unknown time  . senna-docusate (SENOKOT-S) 8.6-50 MG tablet Take 1 tablet by mouth 2 (two) times daily between meals as needed for mild constipation. (Patient not taking: No sig reported) 60 tablet 0 Not Taking at Unknown time    Assessment: 87 yo female admitted on 05/01/2020 with back pain found to have T11 compression fracture. Patient is s/p thoracic 10-11 arthrodesis with neurosurgery. Patient is on warfarin prior to admission for Afib which has been held. Pharmacy consulted to resume warfarin per Dr. 05/03/2020 and neurosurgery on 05/06/20. S/p vitamin K 10mg  po on 12/27.   INR 1.2 is subtherapeutic.  CBC wnl. No reported bleeding.  Prior to admission warfarin regimen per patient: alternating 1mg  and 1.5mg  every other night - no specific dates for each just alternating per patient. No anticoagulation clinic notes in [facility] or Care Everywhere   Goal of Therapy:  INR 2-3 Monitor platelets by anticoagulation protocol: Yes   Plan:  Warfarin 3 mg x1 tonight  Follow up INR tomorrow   Berenice Bouton, PharmD, BCPS PGY2 Pharmacy Resident Phone between 7 am - 3:30 pm: [phone]  Please check AMION for all [place] phone numbers After 10:00 PM, call [place]  [phone]  05/08/2020,7:30 AM

## 2020-05-08 NOTE — Progress Notes (Signed)
PROGRESS NOTE    Danielle Thomas  [id number] DOB: 1928/03/23 DOA: 05/01/2020 PCP: Cari Caraway, MD  Brief Narrative:  HPI per Dr. Zada Finders on 05/01/20 Danielle Thomas is a 87 y.o. female with medical history significant for atrial fibrillation on Coumadin, hypertension, restless leg syndrome who presents to the ED for evaluation of uncontrolled back pain.  Patient was recently admitted from 03/27/2020-03/29/2020 for multiple electrolyte abnormalities.  She was also found to have a T11 compression fracture which was managed with pain medication and TLSO brace.  She was discharged to SNF with plan for outpatient follow-up.  Patient states she was in SNF for 2 weeks before returning home.  She says that since she has been in the hospital she has been essentially bedbound due to difficulty ambulating secondary to continued back pain.  She says previously she would ambulate with the use of a walker.  She currently lives alone.  She otherwise denies any focal deficits or new weakness in her extremities.  ED Course:  Initial vitals showed BP 180/92, pulse 83, RR 16, temp 97.8 F, SPO2 97% on room air.  Labs show sodium 134, potassium 2.9, bicarb 30, BUN 22, creatinine 0.90, serum glucose 130, WBC 11.3, hemoglobin 16.9, platelets 232,000, INR 2.8.  SARS-CoV-2 PCR panel is ordered and pending.  Right rib x-rays negative for displaced rib fracture or other acute abnormality of the right ribs.  No pneumothorax or pleural effusion seen.  MRI thoracic and lumbar spine without contrast showed progressed T11 compression fracture with loss of height increased from 20% to 40%.  New retropulsion of bone resulting in mild spinal stenosis with mild cord mass-effect but no spinal cord signal abnormality also noted.  Acute to subacute posterior left rib fractures 9 through 11 are seen.  No acute osseous abnormality in the lumbar spine seen.  Patient was given IV morphine 6 mg and 4 mg, IV  Dilaudid 0.5 mg, K 40 mEq orally.  EDP discussed with on-call neurosurgery, Dr. Christella Noa who recommended medical admission for further neurosurgery evaluation and potential kyphoplasty after the weekend.  The hospitalist service was consulted to admit for further evaluation management.  **Interim History  Patient underwent surgical intervention on 05/04/2020 by Dr. Christella Noa.  She is postoperative day 3 and still complains of some moderate back pain.  She states that her appetite is diminished.  Will need PT OT to evaluate for safe discharge disposition and they evaluated today and they are recommending CIR and 24-hour supervision and SNF if she is unable to go to CIR.  Will consult CIR for evaluation. Neurosurgery recommending continuing brace while ambulating and continuing pain control. After discussion with Neurosurgery Dr. Christella Noa recommends resuming Anticoagulation 48 hours after surgery and her Coumadin has been resumed but her INR still remains subtherapeutic at 1.2. We will change Abx to po and stop after 7 days.   Assessment & Plan:   Principal Problem:   Compression fracture of T11 vertebra (HCC) Active Problems:   Atrial fibrillation (HCC)   Essential hypertension, benign   Hypokalemia   Multiple fractures of ribs, left side, initial encounter for closed fracture   Closed wedge compression fracture of T11 vertebra (HCC)  Uncontrolled back pain due to progressed T11 compression fracture s/p thoracic 10-11 arthrodesis, allograft morselized, BMP, thoracic 9 thoracic 12 percutaneous pedicle screws postoperative day 4  -Continue TLSO brace, pain control.   -Patient has a known T11 compression fracture and following with orthopedics as an outpatient.   -MRI done  here showed progression of the compression fracture, increased loss of height. It also showed new retropulsion of bone resulting in mild spinal stenosis with mild cord mass effect but no spinal cord signal abnormality  -Dr. Christella Noa,  Neurosurgery was consulted and planning on percutaneous pedicle screws and a T10 to T12 arthrodesis today and she is POD3. -Consult  PT/OT after procedure and they are recommending CIR; CIR consulted for further evaluation and awaiting disposition -Added Robaxin and will continue with IV every 6 hours as needed. -Pain control with cyclobenzaprine 10 mg p.o. 3 times daily as needed muscle spasms, hydrocodone-acetaminophen 1-2 tabs p.o. every 4 hours as needed for moderate pain, methocarbamol 500 g IV every 6 hours as needed for muscle spasms as well, oxycodone 10 mg every 12 hours scheduled -Continue with bowel regimen -Patient continues to have moderate back pain but neurosurgery feels that she is doing well and they are recommending continuing the brace while she is ambulating; they are recommending her to go to CIR once bed is available  Atrial Fibrillation -On Coumadin as an outpatient.  INR 2.8 and trended up to 4.0 and is now 1.2 again today.   -Held Coumadin for her surgical intervention.  On Coumadin for anticoagulation.   -Since INR was 4,neurosugery ordered oral Vitamin K for surgery.  After vitamin K INR is trended down to 1.2 and PT is 15.1 -Anticoagulation resumed 48 hours after surgery and pharmacy is managing and adjusting dosing  Acute to subacute left 9-11 rib fractures: -Seen on MRI.  Continue pain control as above and supportive Care -Incentive spirometer. -She may have fallen at home -Continue to Monitor carefully   Hypertension -Continue losartan.  HCTZ was on hold due to hypokalemia but was resumed yesterday at 10 AM Been she is continuing hydrochlorothiazide 12.5 g daily and losartan 50 mg daily -Continue to monitor blood pressures per protocol -Last blood pressure reading was elevated at 158/76 -We will add hydralazine 10 mg IV every 6 for systolic blood pressure greater than 0000000 or diastolic blood pressure and 90  Hypokalemia -Improved and potassium is now 3.6  and will replete with p.o. KCl 40 mEq x 1 -Magnesium level was 1.8 and will replete with IV mag sulfate 2 g x 1 -IVF is now discontinued  -Continue monitor and replete as necessary -Repeat CMP in a.m.  Hypophosphatemia -Patient's Phos Level today is 3.0 and improved from 1.9 -Continue to Monitor and Trend and replete as necessary -Repeat CMP in the AM  Hyponatremia -Mild -Patient's sodium had dropped from 137 -> 133 -> 134 and is now back up to 135 again today -continue IV fluid hydration as above -Continue to monitor and trend and repeat CMP in a.m.  Restless Leg Syndrome -Continue Ropinirole 0.25 mg p.o. twice daily as needed for restless legs.  Glaucoma -Continue Dorzolamide 1 drop both eyes twice daily as well as latanoprost 1 drop in both eyes nightly  Leukocytosis in the setting of fall and E. coli UTI, poA and now improving  E Coli UTI, poA -She is Afebrile; WBC went from 11.3 -> 12.0 -> 10.8 and down to 10.0 but is now 14.5 and in the setting of her surgical intervention; Now improving and is 13.8 the day before yesterday, was 11.4 yesterday, and is now 9.1 today -U/A Showed turbid appearance with amber color urine, large hemoglobin, moderate leukocytes, negative nitrites, many bacteria, 0-5 non-squamous epithelial cells, greater than 50 RBCs per high-power field, 0-5 squamous epithelial cells, and greater than  50 WBCs  -Urine culture was obtained and was positive for E. 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  -No signs of bowel ischemia. -Need ambulatory referral to vascular surgery   FTT -Patient report was previously independent prior to hospitalization in November -However she is not able to get  out of bed due to pain currently -Nutritionist consulted and recommending Ensure Enlive po BID -Will need PT OT eval once pain improves and she is now being recommended for CIR  Overweight -Estimated body mass index is 28.55 kg/m as calculated from the following:   Height as of this encounter: 5\' 3"  (1.6 m).   Weight as of this encounter: 73.1 kg. -Nutritonist consulted and recommending continuing Ensure Enlive po BID and MVI + Minerals Daily  DVT prophylaxis: SCDs; Will resume Anticoagulation today  Code Status: Partial  Family Communication: Discussed with son at bedside Disposition Plan: PT and OT evaluation recommending CIR vs SNF and clearance by neurosurgery; CIR is evaluating and currently she remains medically stable to be discharged  Status is: Inpatient  Remains inpatient appropriate because:Unsafe d/c plan, IV treatments appropriate due to intensity of illness or inability to take PO and Inpatient level of care appropriate due to severity of illness   Dispo: The patient is from: Home              Anticipated d/c is to: CIR              Anticipated d/c date is: 2 days              Patient currently is medically stable to d/c.  Consultants:   Neurosurgery Dr. Christella Noa  CIR   Procedures:  PROCEDURE by Dr. Ashok Pall:  Procedure(s): thoracic 10-11 arthrodesis, allograft morsels, bmp Thoracic nine to Thoracic twelve percutaneous pedicle screws For segmental pedicle screw fixation  Antimicrobials:  Anti-infectives (From admission, onward)   Start     Dose/Rate Route Frequency Ordered Stop   05/06/20 2100  cephALEXin (KEFLEX) capsule 250 mg        250 mg Oral Every 6 hours 05/06/20 1350 05/09/20 2359   05/04/20 0615  ceFAZolin (ANCEF) IVPB 2g/100 mL premix        2 g 200 mL/hr over 30 Minutes Intravenous On call to O.R. 05/04/20 0517 05/04/20 1008   05/02/20 2200  cefTRIAXone (ROCEPHIN) 1 g in sodium chloride 0.9 % 100 mL IVPB  Status:  Discontinued        1  g 200 mL/hr over 30 Minutes Intravenous Every 24 hours 05/02/20 1919 05/06/20 1350   05/02/20 2015  cefTRIAXone (ROCEPHIN) 1 g in sodium chloride 0.9 % 100 mL IVPB  Status:  Discontinued        1 g 200 mL/hr over 30 Minutes Intravenous Every 24 hours 05/02/20 1918 05/02/20 1919        Subjective: Seen and examined at bedside and she is resting comfortably and still feels about the same and wanting her pain medication.  Denies any nausea or vomiting today.  No lightheadedness or dizziness.  No other concerns or complaints at this time.  Objective: Vitals:   05/07/20 1501 05/07/20 2103 05/08/20 0421 05/08/20 0754  BP: 121/70 133/65 (!) 157/86 Marland Kitchen)  158/76  Pulse: 91 90 88 77  Resp: 14 18 16 16   Temp: 98.2 F (36.8 C) 98.3 F (36.8 C)  98.5 F (36.9 C)  TempSrc: Oral   Oral  SpO2: 97% 96% 96% 94%  Weight:      Height:        Intake/Output Summary (Last 24 hours) at 05/08/2020 [id number] Last data filed at 05/07/2020 0900 Gross per 24 hour  Intake 120 ml  Output --  Net 120 ml   Filed Weights   05/02/20 0500 05/03/20 0529 05/07/20 0634  Weight: 68.5 kg 69.5 kg 73.1 kg   Examination: Physical Exam:  Constitutional: WN/WD, elderly Caucasian female currently in no acute distress appears comfortable but still complains of pain today and states that she feels about the same as yesterday Eyes: Lids and conjunctivae normal, sclerae anicteric  ENMT: External Ears, Nose appear normal.  Mild hard of hearing Neck: Appears normal, supple, no cervical masses, normal ROM, no appreciable thyromegaly; no JVD Respiratory: Diminished to auscultation bilaterally, no wheezing, rales, rhonchi or crackles. Normal respiratory effort and patient is not tachypenic. No accessory muscle use.  Unlabored breathing Cardiovascular: RRR, no murmurs / rubs / gallops. S1 and S2 auscultated.  Very minimal extremity edema Abdomen: Soft, non-tender, distended secondary by habitus. Bowel sounds positive.  GU:  Deferred. Musculoskeletal: No clubbing / cyanosis of digits/nails. No joint deformity upper and lower extremities.  Skin: No rashes, lesions, ulcers on limited skin evaluation. No induration; Warm and dry.  Neurologic: CN 2-12 grossly intact with no focal deficits. Romberg sign and cerebellar reflexes not assessed.  Psychiatric: Normal judgment and insight. Alert and oriented x 3. Normal mood and appropriate affect.   Data Reviewed: I have personally reviewed following labs and imaging studies  CBC: Recent Labs  Lab 05/04/20 0550 05/05/20 0212 05/06/20 0137 05/07/20 0301 05/08/20 0255  WBC 10.0 14.5* 13.8* 11.4* 9.1  NEUTROABS 7.3 12.7* 11.5* 8.6* 6.9  HGB 16.6* 14.5 13.0 12.6 12.3  HCT 51.8* 42.3 38.3 37.3 38.3  MCV 92.3 89.6 89.5 89.2 91.6  PLT 198 216 155 200 0000000   Basic Metabolic Panel: Recent Labs  Lab 05/04/20 0550 05/05/20 0212 05/06/20 0137 05/07/20 0301 05/08/20 0255  NA 137 133* 134* 135 135  K 4.1 5.1 3.9 4.3 3.6  CL 96* 99 99 100 101  CO2 30 23 25 27 26   GLUCOSE 98 137* 117* 112* 101*  BUN 23 19 20 20 16   CREATININE 1.01* 0.87 0.83 0.75 0.70  CALCIUM 8.9 8.3* 8.2* 8.0* 7.8*  MG 1.9 1.7 1.7 2.0 1.8  PHOS 3.3 2.8 2.6 1.9* 3.0   GFR: Estimated Creatinine Clearance: 44.8 mL/min (by C-G formula based on SCr of 0.7 mg/dL). Liver Function Tests: Recent Labs  Lab 05/04/20 0550 05/05/20 0212 05/06/20 0137 05/07/20 0301 05/08/20 0255  AST 28 41 32 23 19  ALT 35 30 26 21 20   ALKPHOS 74 67 59 53 44  BILITOT 0.8 1.1 1.0 1.1 0.8  PROT 5.8* 5.2* 5.1* 5.1* 4.8*  ALBUMIN 3.0* 2.8* 2.6* 2.4* 2.3*   No results for input(s): LIPASE, AMYLASE in the last 168 hours. No results for input(s): AMMONIA in the last 168 hours. Coagulation Profile: Recent Labs  Lab 05/04/20 0840 05/05/20 0212 05/06/20 0137 05/07/20 0301 05/08/20 0255  INR 1.5* 1.2 1.2 1.2 1.2   Cardiac Enzymes: No results for input(s): CKTOTAL, CKMB, CKMBINDEX, TROPONINI in the last 168  hours. BNP (last 3 results) No results for input(s): PROBNP in  the last 8760 hours. HbA1C: No results for input(s): HGBA1C in the last 72 hours. CBG: No results for input(s): GLUCAP in the last 168 hours. Lipid Profile: No results for input(s): CHOL, HDL, LDLCALC, TRIG, CHOLHDL, LDLDIRECT in the last 72 hours. Thyroid Function Tests: No results for input(s): TSH, T4TOTAL, FREET4, T3FREE, THYROIDAB in the last 72 hours. Anemia Panel: No results for input(s): VITAMINB12, FOLATE, FERRITIN, TIBC, IRON, RETICCTPCT in the last 72 hours. Sepsis Labs: No results for input(s): PROCALCITON, LATICACIDVEN in the last 168 hours.  Recent Results (from the past 240 hour(s))  Resp Panel by RT-PCR (Flu A&B, Covid) Nasopharyngeal Swab     Status: None   Collection Time: 05/01/20  6:23 PM   Specimen: Nasopharyngeal Swab; Nasopharyngeal(NP) swabs in vial transport medium  Result Value Ref Range Status   SARS Coronavirus 2 by RT PCR NEGATIVE NEGATIVE Final    Comment: (NOTE) SARS-CoV-2 target nucleic acids are NOT DETECTED.  The SARS-CoV-2 RNA is generally detectable in upper respiratory specimens during the acute phase of infection. The lowest concentration of SARS-CoV-2 viral copies this assay can detect is 138 copies/mL. A negative result does not preclude SARS-Cov-2 infection and should not be used as the sole basis for treatment or other patient management decisions. A negative result may occur with  improper specimen collection/handling, submission of specimen other than nasopharyngeal swab, presence of viral mutation(s) within the areas targeted by this assay, and inadequate number of viral copies(<138 copies/mL). A negative result must be combined with clinical observations, patient history, and epidemiological information. The expected result is Negative.  Fact Sheet for Patients:  EntrepreneurPulse.com.au  Fact Sheet for Healthcare Providers:   IncredibleEmployment.be  This test is no t yet approved or cleared by the [place] FDA and  has been authorized for detection and/or diagnosis of SARS-CoV-2 by FDA under an Emergency Use Authorization (EUA). This EUA will remain  in effect (meaning this test can be used) for the duration of the COVID-19 declaration under Section 564(b)(1) of the Act, 21 U.S.C.section 360bbb-3(b)(1), unless the authorization is terminated  or revoked sooner.       Influenza A by PCR NEGATIVE NEGATIVE Final   Influenza B by PCR NEGATIVE NEGATIVE Final    Comment: (NOTE) The Xpert Xpress SARS-CoV-2/FLU/RSV plus assay is intended as an aid in the diagnosis of influenza from Nasopharyngeal swab specimens and should not be used as a sole basis for treatment. Nasal washings and aspirates are unacceptable for Xpert Xpress SARS-CoV-2/FLU/RSV testing.  Fact Sheet for Patients: EntrepreneurPulse.com.au  Fact Sheet for Healthcare Providers: IncredibleEmployment.be  This test is not yet approved or cleared by the [place] FDA and has been authorized for detection and/or diagnosis of SARS-CoV-2 by FDA under an Emergency Use Authorization (EUA). This EUA will remain in effect (meaning this test can be used) for the duration of the COVID-19 declaration under Section 564(b)(1) of the Act, 21 U.S.C. section 360bbb-3(b)(1), unless the authorization is terminated or revoked.  Performed at [facility], [place] [address] [place], [place] [address]   Culture, Urine     Status: Abnormal   Collection Time: 05/02/20  7:18 PM   Specimen: Urine, Catheterized  Result Value Ref Range Status   Specimen Description   Final    URINE, CATHETERIZED Performed at [place] [address] [place], [place] [address]    Special Requests   Final    NONE Performed at [facility], [address]  [place], [place] [address]    Culture >=100,000 COLONIES/mL ESCHERICHIA COLI (A)  Final   Report Status 05/04/2020 FINAL  Final   Organism ID, Bacteria ESCHERICHIA COLI (A)  Final      Susceptibility   Escherichia coli - MIC*    AMPICILLIN <=2 SENSITIVE Sensitive     CEFAZOLIN <=4 SENSITIVE Sensitive     CEFEPIME <=0.12 SENSITIVE Sensitive     CEFTRIAXONE <=0.25 SENSITIVE Sensitive     CIPROFLOXACIN <=0.25 SENSITIVE Sensitive     GENTAMICIN <=1 SENSITIVE Sensitive     IMIPENEM <=0.25 SENSITIVE Sensitive     NITROFURANTOIN <=16 SENSITIVE Sensitive     TRIMETH/SULFA <=20 SENSITIVE Sensitive     AMPICILLIN/SULBACTAM <=2 SENSITIVE Sensitive     PIP/TAZO <=4 SENSITIVE Sensitive     * >=100,000 COLONIES/mL ESCHERICHIA COLI  Surgical pcr screen     Status: Abnormal   Collection Time: 05/04/20  5:37 AM   Specimen: Nasal Mucosa; Nasal Swab  Result Value Ref Range Status   MRSA, PCR NEGATIVE NEGATIVE Final   Staphylococcus aureus POSITIVE (A) NEGATIVE Final    Comment: (NOTE) The Xpert SA Assay (FDA approved for NASAL specimens in patients 19 years of age and older), is one component of a comprehensive surveillance program. It is not intended to diagnose infection nor to guide or monitor treatment. Performed at [facility], [address] [place], [place] [address]      RN Pressure Injury Documentation:     Estimated body mass index is 28.55 kg/m as calculated from the following:   Height as of this encounter: 5\' 3"  (1.6 m).   Weight as of this encounter: 73.1 kg.  Malnutrition Type:  Nutrition Problem: Inadequate oral intake Etiology: poor appetite   Malnutrition Characteristics:  Signs/Symptoms: per patient/family report   Nutrition Interventions:  Interventions: Ensure Enlive (each supplement provides 350kcal and 20 grams of protein)     Radiology Studies: No results found. Scheduled Meds: . cephALEXin  250 mg Oral Q6H  . Chlorhexidine Gluconate Cloth  6  each Topical Q0600  . dorzolamide  1 drop Both Eyes BID  . feeding supplement  237 mL Oral BID BM  . latanoprost  1 drop Both Eyes QHS  . losartan  50 mg Oral Daily  . multivitamin with minerals  1 tablet Oral Daily  . mupirocin ointment  1 application Nasal BID  . oxyCODONE  10 mg Oral Q12H  . senna-docusate  1 tablet Oral BID  . sodium chloride flush  3 mL Intravenous Q12H  . warfarin  3 mg Oral ONCE-1600  . Warfarin - Pharmacist Dosing Inpatient   Does not apply q1600   Continuous Infusions: . sodium chloride Stopped (05/02/20 2103)  . sodium chloride    . magnesium sulfate bolus IVPB      LOS: 7 days   2104, DO [facility] PAGER is on AMION  If 7PM-7AM, please contact night-coverage [email or web address]

## 2020-05-08 NOTE — Progress Notes (Signed)
Physical Therapy Treatment Patient Details Name: Danielle Thomas MRN: [id number] DOB: Apr 01, 1928 Today's Date: 05/08/2020    History of Present Illness Danielle Thomas is a 87 y.o. female with medical history significant for atrial fibrillation on Coumadin, hypertension, restless leg syndrome who presents to the ED for evaluation of uncontrolled back pain.    She says that since she has been in the hospital she has been essentially bedbound due to difficulty ambulating secondary to continued back pain. Patient was recently admitted from 03/27/2020-03/29/2020 for multiple electrolyte abnormalities.  She was also found to have a T11 compression fracture which was managed with pain medication and TLSO brace.  She was discharged to SNF with plan for outpatient follow-up    PT Comments    Pt making fair progress overall. Despite being in too much pain to attempt further gait training this session, she made some good progress with her bed mobility and transfers as she required less physical assistance than previous sessions. PT continuing to recommend pt d/c to CIR for further intensive therapy services to maximize her independence with functional mobility prior to returning home. PT will continue to follow-up with pt acutely to progress mobility as tolerated.    Follow Up Recommendations  CIR     Equipment Recommendations  Rolling walker with 5" wheels;3in1 (PT)    Recommendations for Other Services       Precautions / Restrictions Precautions Precautions: Fall;Back Required Braces or Orthoses: Spinal Brace Spinal Brace: Lumbar corset Restrictions Weight Bearing Restrictions: No    Mobility  Bed Mobility Overal bed mobility: Needs Assistance Bed Mobility: Rolling;Sidelying to Sit;Sit to Sidelying Rolling: Supervision Sidelying to sit: Min guard     Sit to sidelying: Min assist General bed mobility comments: pt able to roll bilaterally in bed easily to re-position herself; she was able  to use her UEs on the bed rails to assist with trunk elevation to achieve a sitting position at EOB, min A needed to return bilateral LEs onto bed  Transfers Overall transfer level: Needs assistance Equipment used: Rolling walker (2 wheeled) Transfers: Sit to/from Stand Sit to Stand: Min guard         General transfer comment: increased time and effort needed, min guard for safety  Ambulation/Gait Ambulation/Gait assistance: Min guard   Assistive device: Rolling walker (2 wheeled)       General Gait Details: pt only able to tolerate a few side steps at EOB and marching in place this session secondary to pain   Stairs             Wheelchair Mobility    Modified Rankin (Stroke Patients Only)       Balance Overall balance assessment: Needs assistance Sitting-balance support: Feet supported Sitting balance-Leahy Scale: Fair     Standing balance support: Bilateral upper extremity supported Standing balance-Leahy Scale: Poor                              Cognition Arousal/Alertness: Awake/alert Behavior During Therapy: Anxious Overall Cognitive Status: Impaired/Different from baseline Area of Impairment: Following commands;Problem solving;Safety/judgement;Awareness                       Following Commands: Follows one step commands with increased time Safety/Judgement: Decreased awareness of deficits;Decreased awareness of safety Awareness: Emergent Problem Solving: Slow processing;Requires verbal cues        Exercises      General Comments  Pertinent Vitals/Pain Pain Assessment: Faces Faces Pain Scale: Hurts even more Pain Location: back Pain Descriptors / Indicators: Guarding;Grimacing Pain Intervention(s): Monitored during session;Repositioned;Premedicated before session    Home Living                      Prior Function            PT Goals (current goals can now be found in the care plan section) Acute  Rehab PT Goals PT Goal Formulation: With patient Time For Goal Achievement: 05/19/20 Potential to Achieve Goals: Fair Progress towards PT goals: Progressing toward goals    Frequency    Min 4X/week      PT Plan Current plan remains appropriate    Co-evaluation              AM-PAC PT "6 Clicks" Mobility   Outcome Measure  Help needed turning from your back to your side while in a flat bed without using bedrails?: None Help needed moving from lying on your back to sitting on the side of a flat bed without using bedrails?: A Little Help needed moving to and from a bed to a chair (including a wheelchair)?: A Little Help needed standing up from a chair using your arms (e.g., wheelchair or bedside chair)?: A Little Help needed to walk in hospital room?: A Little Help needed climbing 3-5 steps with a railing? : A Lot 6 Click Score: 18    End of Session   Activity Tolerance: Patient limited by pain Patient left: in bed;with call bell/phone within reach;with bed alarm set Nurse Communication: Mobility status PT Visit Diagnosis: Other abnormalities of gait and mobility (R26.89);Pain Pain - part of body:  (back)     Time: 4496-7591 PT Time Calculation (min) (ACUTE ONLY): 14 min  Charges:  $Therapeutic Activity: 8-22 mins                     Arletta Bale, DPT  Acute Rehabilitation Services Office [phone]     Alessandra Bevels Lansing Sigmon 05/08/2020, 11:06 AM

## 2020-05-08 NOTE — Progress Notes (Signed)
Subjective: Patient reports doing ok.  Still quite sore.  Objective: Vital signs in last 24 hours: Temp:  [98.2 F (36.8 C)-98.5 F (36.9 C)] 98.5 F (36.9 C) (01/01 0754) Pulse Rate:  [77-91] 77 (01/01 0754) Resp:  [14-18] 16 (01/01 0754) BP: (121-158)/(65-86) 158/76 (01/01 0754) SpO2:  [94 %-97 %] 94 % (01/01 0754)  Intake/Output from previous day: 12/31 0701 - 01/01 0700 In: 120 [P.O.:120] Out: -  Intake/Output this shift: No intake/output data recorded.  Physical Exam: Dressing CDI.  Some bruising around surgical site.  No drainage.Strength good in lower extremities.   Lab Results: Recent Labs    05/07/20 0301 05/08/20 0255  WBC 11.4* 9.1  HGB 12.6 12.3  HCT 37.3 38.3  PLT 200 193   BMET Recent Labs    05/07/20 0301 05/08/20 0255  NA 135 135  K 4.3 3.6  CL 100 101  CO2 27 26  GLUCOSE 112* 101*  BUN 20 16  CREATININE 0.75 0.70  CALCIUM 8.0* 7.8*    Studies/Results: No results found.  Assessment/Plan: Patient is doing well and is improving steadily.  Awaiting Rehab placement.    LOS: 7 days    Dorian Heckle, MD 05/08/2020, 9:48 AM

## 2020-05-09 DIAGNOSIS — S22080A Wedge compression fracture of T11-T12 vertebra, initial encounter for closed fracture: Secondary | ICD-10-CM | POA: Diagnosis not present

## 2020-05-09 DIAGNOSIS — I480 Paroxysmal atrial fibrillation: Secondary | ICD-10-CM | POA: Diagnosis not present

## 2020-05-09 DIAGNOSIS — I10 Essential (primary) hypertension: Secondary | ICD-10-CM | POA: Diagnosis not present

## 2020-05-09 DIAGNOSIS — R208 Other disturbances of skin sensation: Secondary | ICD-10-CM

## 2020-05-09 DIAGNOSIS — E876 Hypokalemia: Secondary | ICD-10-CM | POA: Diagnosis not present

## 2020-05-09 DIAGNOSIS — K5900 Constipation, unspecified: Secondary | ICD-10-CM

## 2020-05-09 LAB — PROTIME-INR
INR: 1.4 — ABNORMAL HIGH (ref 0.8–1.2)
Prothrombin Time: 16.5 seconds — ABNORMAL HIGH (ref 11.4–15.2)

## 2020-05-09 MED ORDER — WARFARIN SODIUM 3 MG PO TABS
3.0000 mg | ORAL_TABLET | Freq: Once | ORAL | Status: AC
  Administered 2020-05-09: 3 mg via ORAL
  Filled 2020-05-09: qty 1

## 2020-05-09 MED ORDER — HYDROCODONE-ACETAMINOPHEN 10-325 MG PO TABS
1.0000 | ORAL_TABLET | ORAL | Status: DC | PRN
Start: 2020-05-09 — End: 2020-05-12
  Administered 2020-05-10 – 2020-05-12 (×5): 1 via ORAL
  Administered 2020-05-12: 2 via ORAL
  Filled 2020-05-09 (×4): qty 1
  Filled 2020-05-09: qty 2
  Filled 2020-05-09: qty 1

## 2020-05-09 MED ORDER — BISACODYL 10 MG RE SUPP: 10.0000 mg | Freq: Every day | RECTAL | Status: DC | PRN

## 2020-05-09 MED ORDER — HYDROCORTISONE (PERIANAL) 2.5 % EX CREA
TOPICAL_CREAM | Freq: Two times a day (BID) | CUTANEOUS | Status: DC
  Administered 2020-05-09: 1 via RECTAL
  Filled 2020-05-09: qty 28.35

## 2020-05-09 MED ORDER — FENTANYL CITRATE (PF) 100 MCG/2ML IJ SOLN
25.0000 ug | Freq: Once | INTRAMUSCULAR | Status: AC
Start: 2020-05-09 — End: 2020-05-09
  Administered 2020-05-09: 25 ug via INTRAVENOUS
  Filled 2020-05-09: qty 2

## 2020-05-09 MED ORDER — POLYETHYLENE GLYCOL 3350 17 G PO PACK
17.0000 g | PACK | Freq: Two times a day (BID) | ORAL | Status: DC
  Administered 2020-05-09 – 2020-05-13 (×4): 17 g via ORAL
  Filled 2020-05-09 (×8): qty 1

## 2020-05-09 NOTE — Plan of Care (Signed)
  Problem: Education: ?Goal: Knowledge of General Education information will improve ?Description: Including pain rating scale, medication(s)/side effects and non-pharmacologic comfort measures ?Outcome: Progressing ?  ?Problem: Clinical Measurements: ?Goal: Respiratory complications will improve ?Outcome: Progressing ?  ?Problem: Activity: ?Goal: Risk for activity intolerance will decrease ?Outcome: Progressing ?  ?Problem: Nutrition: ?Goal: Adequate nutrition will be maintained ?Outcome: Progressing ?  ?Problem: Coping: ?Goal: Level of anxiety will decrease ?Outcome: Progressing ?  ?Problem: Elimination: ?Goal: Will not experience complications related to bowel motility ?Outcome: Progressing ?Goal: Will not experience complications related to urinary retention ?Outcome: Progressing ?  ?

## 2020-05-09 NOTE — Progress Notes (Signed)
ANTICOAGULATION CONSULT NOTE - Follow Up Consult  Pharmacy Consult for Warfarin Indication: atrial fibrillation  Allergies  Allergen Reactions  . Amlodipine Besylate     Other reaction(s): high dose make feet swell  . Codeine Nausea And Vomiting  . Meperidine Nausea Only  . Metoprolol Succinate [Metoprolol]     Other reaction(s): effects her breathing  . Oxycodone Nausea And Vomiting    Patient Measurements: Height: 5\' 3"  (160 cm) Weight: 75.5 kg (166 lb 7.2 oz) IBW/kg (Calculated) : 52.4   Vital Signs: Temp: 98.3 F (36.8 C) (01/02 0508) Temp Source: Oral (01/02 0508) BP: 144/92 (01/02 0508) Pulse Rate: 93 (01/02 0508)  Labs: Recent Labs    05/07/20 0301 05/08/20 0255 05/09/20 0151  HGB 12.6 12.3  --   HCT 37.3 38.3  --   PLT 200 193  --   LABPROT 14.5 15.1 16.5*  INR 1.2 1.2 1.4*  CREATININE 0.75 0.70  --     Estimated Creatinine Clearance: 45.5 mL/min (by C-G formula based on SCr of 0.7 mg/dL).   Medical History: Past Medical History:  Diagnosis Date  . Hypertension     Medications:  Medications Prior to Admission  Medication Sig Dispense Refill Last Dose  . dorzolamide (TRUSOPT) 2 % ophthalmic solution Place 1 drop into both eyes 2 (two) times daily.    05/01/2020 at Unknown time  . hydrochlorothiazide (MICROZIDE) 12.5 MG capsule Take 1 capsule (12.5 mg total) by mouth daily. *Please call and schedule a one year follow up appointment* (Patient taking differently: Take 12.5 mg by mouth daily.) 30 capsule 0 05/01/2020 at Unknown time  . losartan (COZAAR) 50 MG tablet Take 50 mg by mouth daily.   05/01/2020 at Unknown time  . methocarbamol (ROBAXIN) 500 MG tablet Take 250 mg by mouth daily as needed for muscle spasms.   05/01/2020 at Unknown time  . oxyCODONE-acetaminophen (PERCOCET/ROXICET) 5-325 MG tablet Take 1 tablet by mouth every 4 (four) hours as needed for severe pain.   05/01/2020 at Unknown time  . rOPINIRole (REQUIP) 0.25 MG tablet Take 0.25 mg  by mouth 2 (two) times daily as needed (restless legs). Restless leg   04/30/2020 at Unknown time  . travoprost, benzalkonium, (TRAVATAN) 0.004 % ophthalmic solution Place 1 drop into both eyes at bedtime.    04/30/2020 at Unknown time  . warfarin (COUMADIN) 1 MG tablet Take 1 mg by mouth every other day.   04/29/2020 at 7 pm  . warfarin (COUMADIN) 3 MG tablet Take 1.5 mg by mouth every other day.   04/30/2020 at 7 pm  . amLODipine (NORVASC) 10 MG tablet Take 1 tablet (10 mg total) by mouth daily. (Patient not taking: Reported on 05/01/2020)   Not Taking at Unknown time  . polyethylene glycol (MIRALAX / GLYCOLAX) 17 g packet Take 17 g by mouth 2 (two) times daily as needed for mild constipation (use first). (Patient not taking: No sig reported) 14 each 0 Not Taking at Unknown time  . senna-docusate (SENOKOT-S) 8.6-50 MG tablet Take 1 tablet by mouth 2 (two) times daily between meals as needed for mild constipation. (Patient not taking: No sig reported) 60 tablet 0 Not Taking at Unknown time    Assessment: 87 yo female admitted on 05/01/2020 with back pain found to have T11 compression fracture. Patient is s/p thoracic 10-11 arthrodesis with neurosurgery. Patient is on warfarin prior to admission for Afib which has been held. Pharmacy consulted to resume warfarin per Dr. Alfredia Ferguson and neurosurgery on 05/06/20.  S/p vitamin K 10mg  po on 12/27.   INR 1.4 is subtherapeutic but trending up. No reported bleeding.  Prior to admission warfarin regimen per patient: alternating 1mg  and 1.5mg  every other night - no specific dates for each just alternating per patient. No anticoagulation clinic notes in [facility] or Care Everywhere   Goal of Therapy:  INR 2-3 Monitor platelets by anticoagulation protocol: Yes   Plan:  Warfarin 3 mg x1 tonight  Follow up INR tomorrow   , PharmD, BCPS PGY2 Pharmacy Resident Phone between 7 am - 3:30 pm: [facility]  Please check AMION for all [facility] phone  numbers After 10:00 PM, call Main Pharmacy [phone]  05/09/2020,7:49 AM

## 2020-05-09 NOTE — Progress Notes (Signed)
Danielle Thomas is cleared for CIR whenever available.

## 2020-05-09 NOTE — Plan of Care (Signed)

## 2020-05-09 NOTE — Progress Notes (Signed)
PROGRESS NOTE    Danielle Thomas  [id number] DOB: 06-27-1927 DOA: 05/01/2020 PCP: Cari Caraway, MD  Brief Narrative:  HPI per Dr. Zada Finders on 05/01/20 Danielle Thomas is a 87 y.o. female with medical history significant for atrial fibrillation on Coumadin, hypertension, restless leg syndrome who presents to the ED for evaluation of uncontrolled back pain.  Patient was recently admitted from 03/27/2020-03/29/2020 for multiple electrolyte abnormalities.  She was also found to have a T11 compression fracture which was managed with pain medication and TLSO brace.  She was discharged to SNF with plan for outpatient follow-up.  Patient states she was in SNF for 2 weeks before returning home.  She says that since she has been in the hospital she has been essentially bedbound due to difficulty ambulating secondary to continued back pain.  She says previously she would ambulate with the use of a walker.  She currently lives alone.  She otherwise denies any focal deficits or new weakness in her extremities.  ED Course:  Initial vitals showed BP 180/92, pulse 83, RR 16, temp 97.8 F, SPO2 97% on room air.  Labs show sodium 134, potassium 2.9, bicarb 30, BUN 22, creatinine 0.90, serum glucose 130, WBC 11.3, hemoglobin 16.9, platelets 232,000, INR 2.8.  SARS-CoV-2 PCR panel is ordered and pending.  Right rib x-rays negative for displaced rib fracture or other acute abnormality of the right ribs.  No pneumothorax or pleural effusion seen.  MRI thoracic and lumbar spine without contrast showed progressed T11 compression fracture with loss of height increased from 20% to 40%.  New retropulsion of bone resulting in mild spinal stenosis with mild cord mass-effect but no spinal cord signal abnormality also noted.  Acute to subacute posterior left rib fractures 9 through 11 are seen.  No acute osseous abnormality in the lumbar spine seen.  Patient was given IV morphine 6 mg and 4 mg, IV  Dilaudid 0.5 mg, K 40 mEq orally.  EDP discussed with on-call neurosurgery, Dr. Christella Noa who recommended medical admission for further neurosurgery evaluation and potential kyphoplasty after the weekend.  The hospitalist service was consulted to admit for further evaluation management.  **Interim History  Patient underwent surgical intervention on 05/04/2020 by Dr. Christella Noa.  She is postoperative day 5 and still complains of some moderate back pain.  She states that her appetite is diminished.  Will need PT OT to evaluate for safe discharge disposition and they evaluated today and they are recommending CIR and 24-hour supervision and SNF if she is unable to go to CIR.  Will consult CIR for evaluation. Neurosurgery recommending continuing brace while ambulating and continuing pain control. After discussion with Neurosurgery Dr. Christella Noa recommended resuming Anticoagulation 48 hours after surgery and her Coumadin has been resumed but heer INR still remains subtherapeutic but slightly improving and has gone from 1.2 and is now 1.4. We will change Abx to po and stop after 7 days and she has completed course  Her pain was uncontrolled today we will give her a one-time dose of IV fentanyl and continue with p.o. hydrocodone-acetaminophen and schedule oxycodone.  We will also try Robaxin and she has not had a bowel movement in a few days now so we will adjust her bowel regimen and start her on scheduled MiraLAX as well as continuing scheduled senna docusate and giving her a bisacodyl suppository if necessary.  She is also complaining of some rectal burning so we will try some Anusol rectal cream.  Assessment & Plan:  Principal Problem:   Compression fracture of T11 vertebra (HCC) Active Problems:   Atrial fibrillation (HCC)   Essential hypertension, benign   Hypokalemia   Multiple fractures of ribs, left side, initial encounter for closed fracture   Closed wedge compression fracture of T11 vertebra  (HCC)  Uncontrolled back pain due to progressed T11 compression fracture s/p thoracic 10-11 arthrodesis, allograft morselized, BMP, thoracic 9 thoracic 12 percutaneous pedicle screws postoperative day 5 -Continue TLSO brace, pain control.   -Patient has a known T11 compression fracture and following with orthopedics as an outpatient.   -MRI done here showed progression of the compression fracture, increased loss of height. It also showed new retropulsion of bone resulting in mild spinal stenosis with mild cord mass effect but no spinal cord signal abnormality  -Dr. Christella Noa, Neurosurgery was consulted and planning on percutaneous pedicle screws and a T10 to T12 arthrodesis today and she is POD5. -Consult  PT/OT after procedure and they are recommending CIR; CIR consulted for further evaluation and awaiting disposition -Added Robaxin and will continue with IV every 6 hours as needed. -Pain control with cyclobenzaprine 10 mg p.o. 3 times daily as needed muscle spasms, hydrocodone-acetaminophen 1-2 tabs p.o. every 4 hours as needed for moderate pain, methocarbamol 500 g IV every 6 hours as needed for muscle spasms as well, oxycodone 10 mg every 12 hours scheduled -Continue with bowel regimen -Patient continues to have moderate back pain but neurosurgery feels that she is doing well and they are recommending continuing the brace while she is ambulating; they are recommending her to go to CIR once bed is available.  Her pain was uncontrolled today so we will give her a one-time dose of IV fentanyl 25 mcg  Atrial Fibrillation -On Coumadin as an outpatient.  INR 2.8 and trended up to 4.0 and is now 1.4 today .   -Held Coumadin for her surgical intervention.  On Coumadin for anticoagulation.   -Since INR was 4,neurosugery ordered oral Vitamin K for surgery.  After vitamin K INR is trended down to 1.4 and PT is 16.5 -Anticoagulation resumed 48 hours after surgery and pharmacy is managing and adjusting  dosing  Acute to subacute left 9-11 rib fractures: -Seen on MRI.  Continue pain control as above and supportive Care -Incentive spirometer. -She may have fallen at home -Continue to Monitor carefully   Hypertension -Continue losartan.  HCTZ was on hold due to hypokalemia but was resumed yesterday at 10 AM Been she is continuing hydrochlorothiazide 12.5 g daily and losartan 50 mg daily -Continue to monitor blood pressures per protocol -Last blood pressure reading was elevated at 150/89 -We will add hydralazine 10 mg IV every 6 for systolic blood pressure greater than 0000000 or diastolic blood pressure and 90  Hypokalemia -Improved and potassium is now 3.6 and will replete with p.o. KCl 40 mEq x 1 yesterday -Magnesium level was 1.8 and will replete with IV mag sulfate 2 g x 1 yesterday -IVF is now discontinued  -Continue monitor and replete as necessary -Repeat CMP in a.m.  Hypophosphatemia -Patient's Phos Level was 3.0 yesterday  -Continue to Monitor and Trend and replete as necessary -Repeat CMP in the AM  Hyponatremia -Mild -Patient's sodium had dropped from 137 -> 133 -> 134 and is now back up to 135 again yesterday  -continue IV fluid hydration as above -Continue to monitor and trend and repeat CMP in a.m.  Restless Leg Syndrome -Continue Ropinirole 0.25 mg p.o. twice daily as needed for restless  legs.  Glaucoma -Continue Dorzolamide 1 drop both eyes twice daily as well as latanoprost 1 drop in both eyes nightly  Leukocytosis in the setting of fall and E. coli UTI, poA and now improving  E Coli UTI, poA -She is Afebrile; WBC improved to 9.1 on last check on 05/09/2019 -U/A Showed turbid appearance with amber color urine, large hemoglobin, moderate leukocytes, negative nitrites, many bacteria, 0-5 non-squamous epithelial cells, greater than 50 RBCs per high-power field, 0-5 squamous epithelial cells, and greater than 50 WBCs  -Urine culture was obtained and was positive  for E. 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  -No signs of bowel ischemia. -Need ambulatory referral to vascular surgery   Constipation -Has not had a bowel movement in few days so we will adjust her bowel regimen and likely in the setting of opioid usage -She has 5 mg of bisacodyl daily as needed for moderate constipation as well as 10 mg rectally daily as needed for moderate constipation and we have started her on scheduled bowel regimen with MiraLAX 17 g p.o. twice daily as well as senna docusate 1 tab p.o. twice daily  Rectal burning -Could be from straining and could be from hemorrhoids -We will start hydrocortisone 2.5% rectal cream twice daily  FTT -Patient report was previously independent prior to hospitalization in November -However she is not able to get out of bed due to pain currently -Nutritionist consulted and recommending Ensure Enlive po BID -Will need PT OT eval once pain improves and she is now being recommended for CIR  Overweight -Estimated body mass index is 29.48 kg/m as calculated from the following:   Height as of this encounter: 5\' 3"  (1.6 m).   Weight as of this encounter: 75.5 kg. -Nutritonist consulted and recommending continuing Ensure Enlive po BID and MVI + Minerals Daily  DVT prophylaxis: SCDs; Will resume Anticoagulation today  Code Status: Partial  Family Communication: Discussed with son at bedside Disposition Plan: PT and OT evaluation recommending CIR vs SNF and clearance by neurosurgery; CIR is evaluating and currently she remains medically stable to be discharged  Status is: Inpatient  Remains inpatient appropriate because:Unsafe d/c plan, IV treatments appropriate due to  intensity of illness or inability to take PO and Inpatient level of care appropriate due to severity of illness   Dispo: The patient is from: Home              Anticipated d/c is to: CIR              Anticipated d/c date is: 1-2 days              Patient currently is medically stable to d/c.  Consultants:   Neurosurgery Dr. Christella Noa  CIR   Procedures:  PROCEDURE by Dr. Ashok Pall:  Procedure(s): thoracic 10-11 arthrodesis, allograft morsels, bmp Thoracic nine to Thoracic twelve percutaneous pedicle screws For segmental pedicle screw fixation  Antimicrobials:  Anti-infectives (From admission, onward)   Start     Dose/Rate Route Frequency Ordered Stop   05/06/20 2100  cephALEXin (KEFLEX) capsule 250 mg        250 mg Oral Every 6 hours 05/06/20 1350 05/09/20 2359   05/04/20 0615  ceFAZolin (  ANCEF) IVPB 2g/100 mL premix        2 g 200 mL/hr over 30 Minutes Intravenous On call to O.R. 05/04/20 0517 05/04/20 1008   05/02/20 2200  cefTRIAXone (ROCEPHIN) 1 g in sodium chloride 0.9 % 100 mL IVPB  Status:  Discontinued        1 g 200 mL/hr over 30 Minutes Intravenous Every 24 hours 05/02/20 1919 05/06/20 1350   05/02/20 2015  cefTRIAXone (ROCEPHIN) 1 g in sodium chloride 0.9 % 100 mL IVPB  Status:  Discontinued        1 g 200 mL/hr over 30 Minutes Intravenous Every 24 hours 05/02/20 1918 05/02/20 1919        Subjective: Seen and examined at bedside still complaining of significant amount of pain and states is uncontrolled and states that the oxycodone did not help at all.  Also complaining some rectal burning and states that she has not had a bowel movement in a few days now but she is passing gas.  Denies any lightheadedness or dizziness, chest pain or shortness breath but her main complaint is her back pain.  Objective: Vitals:   05/08/20 2029 05/09/20 0500 05/09/20 0508 05/09/20 0808  BP: (!) 158/81  (!) 144/92 (!) 150/89  Pulse: 80  93 84  Resp:    20  Temp: 98.7 F (37.1  C)  98.3 F (36.8 C) 98.2 F (36.8 C)  TempSrc: Oral  Oral Oral  SpO2: 94%  100% 97%  Weight:  75.5 kg    Height:        Intake/Output Summary (Last 24 hours) at 05/09/2020 0847 Last data filed at 05/09/2020 0600 Gross per 24 hour  Intake 61.38 ml  Output 900 ml  Net -838.62 ml   Filed Weights   05/03/20 0529 05/07/20 0634 05/09/20 0500  Weight: 69.5 kg 73.1 kg 75.5 kg   Examination: Physical Exam:  Constitutional: WN/WD elderly Caucasian female currently in no acute distress but does appear little uncomfortable and complains of significant pain today and states that is all worse than yesterday Eyes: Lids and conjunctivae normal, sclerae anicteric  ENMT: External Ears, Nose appear normal. Grossly normal hearing.  Neck: Appears normal, supple, no cervical masses, normal ROM, no appreciable thyromegaly; no JVD Respiratory: Diminished to auscultation bilaterally with coarse breath sounds, no wheezing, rales, rhonchi or crackles. Normal respiratory effort and patient is not tachypenic. No accessory muscle use.  Unlabored breathing Cardiovascular: RRR, no murmurs / rubs / gallops. S1 and S2 auscultated.  Very mild extremity edema Abdomen: Soft, non-tender, distended secondary to body habitus. Bowel sounds positive.  GU: Deferred. Musculoskeletal: No clubbing / cyanosis of digits/nails. No joint deformity upper and lower extremities.  Skin: No rashes, lesions, ulcers on limited skin evaluation. No induration; Warm and dry.  Neurologic: CN 2-12 grossly intact with no focal deficits. Romberg sign and cerebellar reflexes not assessed.  Psychiatric: Normal judgment and insight. Alert and oriented x 3. Normal mood and appropriate affect.   Data Reviewed: I have personally reviewed following labs and imaging studies  CBC: Recent Labs  Lab 05/04/20 0550 05/05/20 0212 05/06/20 0137 05/07/20 0301 05/08/20 0255  WBC 10.0 14.5* 13.8* 11.4* 9.1  NEUTROABS 7.3 12.7* 11.5* 8.6* 6.9  HGB  16.6* 14.5 13.0 12.6 12.3  HCT 51.8* 42.3 38.3 37.3 38.3  MCV 92.3 89.6 89.5 89.2 91.6  PLT 198 216 155 200 0000000   Basic Metabolic Panel: Recent Labs  Lab 05/04/20 0550 05/05/20 0212 05/06/20 0137 05/07/20 0301  05/08/20 0255  NA 137 133* 134* 135 135  K 4.1 5.1 3.9 4.3 3.6  CL 96* 99 99 100 101  CO2 30 23 25 27 26   GLUCOSE 98 137* 117* 112* 101*  BUN 23 19 20 20 16   CREATININE 1.01* 0.87 0.83 0.75 0.70  CALCIUM 8.9 8.3* 8.2* 8.0* 7.8*  MG 1.9 1.7 1.7 2.0 1.8  PHOS 3.3 2.8 2.6 1.9* 3.0   GFR: Estimated Creatinine Clearance: 45.5 mL/min (by C-G formula based on SCr of 0.7 mg/dL). Liver Function Tests: Recent Labs  Lab 05/04/20 0550 05/05/20 0212 05/06/20 0137 05/07/20 0301 05/08/20 0255  AST 28 41 32 23 19  ALT 35 30 26 21 20   ALKPHOS 74 67 59 53 44  BILITOT 0.8 1.1 1.0 1.1 0.8  PROT 5.8* 5.2* 5.1* 5.1* 4.8*  ALBUMIN 3.0* 2.8* 2.6* 2.4* 2.3*   No results for input(s): LIPASE, AMYLASE in the last 168 hours. No results for input(s): AMMONIA in the last 168 hours. Coagulation Profile: Recent Labs  Lab 05/05/20 0212 05/06/20 0137 05/07/20 0301 05/08/20 0255 05/09/20 0151  INR 1.2 1.2 1.2 1.2 1.4*   Cardiac Enzymes: No results for input(s): CKTOTAL, CKMB, CKMBINDEX, TROPONINI in the last 168 hours. BNP (last 3 results) No results for input(s): PROBNP in the last 8760 hours. HbA1C: No results for input(s): HGBA1C in the last 72 hours. CBG: No results for input(s): GLUCAP in the last 168 hours. Lipid Profile: No results for input(s): CHOL, HDL, LDLCALC, TRIG, CHOLHDL, LDLDIRECT in the last 72 hours. Thyroid Function Tests: No results for input(s): TSH, T4TOTAL, FREET4, T3FREE, THYROIDAB in the last 72 hours. Anemia Panel: No results for input(s): VITAMINB12, FOLATE, FERRITIN, TIBC, IRON, RETICCTPCT in the last 72 hours. Sepsis Labs: No results for input(s): PROCALCITON, LATICACIDVEN in the last 168 hours.  Recent Results (from the past 240 hour(s))  Resp  Panel by RT-PCR (Flu A&B, Covid) Nasopharyngeal Swab     Status: None   Collection Time: 05/01/20  6:23 PM   Specimen: Nasopharyngeal Swab; Nasopharyngeal(NP) swabs in vial transport medium  Result Value Ref Range Status   SARS Coronavirus 2 by RT PCR NEGATIVE NEGATIVE Final    Comment: (NOTE) SARS-CoV-2 target nucleic acids are NOT DETECTED.  The SARS-CoV-2 RNA is generally detectable in upper respiratory specimens during the acute phase of infection. The lowest concentration of SARS-CoV-2 viral copies this assay can detect is 138 copies/mL. A negative result does not preclude SARS-Cov-2 infection and should not be used as the sole basis for treatment or other patient management decisions. A negative result may occur with  improper specimen collection/handling, submission of specimen other than nasopharyngeal swab, presence of viral mutation(s) within the areas targeted by this assay, and inadequate number of viral copies(<138 copies/mL). A negative result must be combined with clinical observations, patient history, and epidemiological information. The expected result is Negative.  Fact Sheet for Patients:  07/06/20  Fact Sheet for Healthcare Providers:  07/07/20  This test is no t yet approved or cleared by the 05/03/20 FDA and  has been authorized for detection and/or diagnosis of SARS-CoV-2 by FDA under an Emergency Use Authorization (EUA). This EUA will remain  in effect (meaning this test can be used) for the duration of the COVID-19 declaration under Section 564(b)(1) of the Act, 21 U.S.C.section 360bbb-3(b)(1), unless the authorization is terminated  or revoked sooner.       Influenza A by PCR NEGATIVE NEGATIVE Final   Influenza B by PCR NEGATIVE  NEGATIVE Final    Comment: (NOTE) The Xpert Xpress SARS-CoV-2/FLU/RSV plus assay is intended as an aid in the diagnosis of influenza from Nasopharyngeal  swab specimens and should not be used as a sole basis for treatment. Nasal washings and aspirates are unacceptable for Xpert Xpress SARS-CoV-2/FLU/RSV testing.  Fact Sheet for Patients: EntrepreneurPulse.com.au  Fact Sheet for Healthcare Providers: IncredibleEmployment.be  This test is not yet approved or cleared by the [place] FDA and has been authorized for detection and/or diagnosis of SARS-CoV-2 by FDA under an Emergency Use Authorization (EUA). This EUA will remain in effect (meaning this test can be used) for the duration of the COVID-19 declaration under Section 564(b)(1) of the Act, 21 U.S.C. section 360bbb-3(b)(1), unless the authorization is terminated or revoked.  Performed at [facility], [place] [address] [place] [address]   Culture, Urine     Status: Abnormal   Collection Time: 05/02/20  7:18 PM   Specimen: Urine, Catheterized  Result Value Ref Range Status   Specimen Description   Final    URINE, CATHETERIZED Performed at [facility] [address], [place], [place] [address]    Special Requests   Final    NONE Performed at [facility], [place] [address], [place], [place] [address]    Culture >=100,000 COLONIES/mL ESCHERICHIA COLI (A)  Final   Report Status 05/04/2020 FINAL  Final   Organism ID, Bacteria ESCHERICHIA COLI (A)  Final      Susceptibility   Escherichia coli - MIC*    AMPICILLIN <=2 SENSITIVE Sensitive     CEFAZOLIN <=4 SENSITIVE Sensitive     CEFEPIME <=0.12 SENSITIVE Sensitive     CEFTRIAXONE <=0.25 SENSITIVE Sensitive     CIPROFLOXACIN <=0.25 SENSITIVE Sensitive     GENTAMICIN <=1 SENSITIVE Sensitive     IMIPENEM <=0.25 SENSITIVE Sensitive     NITROFURANTOIN <=16 SENSITIVE Sensitive     TRIMETH/SULFA <=20 SENSITIVE Sensitive     AMPICILLIN/SULBACTAM <=2 SENSITIVE Sensitive     PIP/TAZO <=4 SENSITIVE Sensitive     * >=100,000  COLONIES/mL ESCHERICHIA COLI  Surgical pcr screen     Status: Abnormal   Collection Time: 05/04/20  5:37 AM   Specimen: Nasal Mucosa; Nasal Swab  Result Value Ref Range Status   MRSA, PCR NEGATIVE NEGATIVE Final   Staphylococcus aureus POSITIVE (A) NEGATIVE Final    Comment: (NOTE) The Xpert SA Assay (FDA approved for NASAL specimens in patients 58 years of age and older), is one component of a comprehensive surveillance program. It is not intended to diagnose infection nor to guide or monitor treatment. Performed at [facility], [place] [address] [place], [place] [address]      RN Pressure Injury Documentation:     Estimated body mass index is 29.48 kg/m as calculated from the following:   Height as of this encounter: 5\' 3"  (1.6 m).   Weight as of this encounter: 75.5 kg.  Malnutrition Type:  Nutrition Problem: Inadequate oral intake Etiology: poor appetite   Malnutrition Characteristics:  Signs/Symptoms: per patient/family report   Nutrition Interventions:  Interventions: Ensure Enlive (each supplement provides 350kcal and 20 grams of protein)     Radiology Studies: No results found. Scheduled Meds: . cephALEXin  250 mg Oral Q6H  . dorzolamide  1 drop Both Eyes BID  . feeding supplement  237 mL Oral BID BM  . latanoprost  1 drop Both Eyes QHS  . losartan  50 mg Oral  Daily  . multivitamin with minerals  1 tablet Oral Daily  . mupirocin ointment  1 application Nasal BID  . oxyCODONE  10 mg Oral Q12H  . senna-docusate  1 tablet Oral BID  . sodium chloride flush  3 mL Intravenous Q12H  . warfarin  3 mg Oral ONCE-1600  . Warfarin - Pharmacist Dosing Inpatient   Does not apply q1600   Continuous Infusions: . sodium chloride Stopped (05/02/20 2103)  . sodium chloride      LOS: 8 days   Kerney Elbe, DO [facility] PAGER is on [place]  If 7PM-7AM, please contact night-coverage [email or web address]

## 2020-05-09 NOTE — Progress Notes (Signed)
Continues to complain of severe back pain.  She is moving her legs well.  Will work on pain management.  Await rehab.

## 2020-05-10 DIAGNOSIS — I10 Essential (primary) hypertension: Secondary | ICD-10-CM | POA: Diagnosis not present

## 2020-05-10 DIAGNOSIS — E876 Hypokalemia: Secondary | ICD-10-CM | POA: Diagnosis not present

## 2020-05-10 DIAGNOSIS — I480 Paroxysmal atrial fibrillation: Secondary | ICD-10-CM | POA: Diagnosis not present

## 2020-05-10 DIAGNOSIS — S22080A Wedge compression fracture of T11-T12 vertebra, initial encounter for closed fracture: Secondary | ICD-10-CM | POA: Diagnosis not present

## 2020-05-10 LAB — CBC
HCT: 37.4 % (ref 36.0–46.0)
Hemoglobin: 12.9 g/dL (ref 12.0–15.0)
MCH: 30.5 pg (ref 26.0–34.0)
MCHC: 34.5 g/dL (ref 30.0–36.0)
MCV: 88.4 fL (ref 80.0–100.0)
Platelets: 190 10*3/uL (ref 150–400)
RBC: 4.23 MIL/uL (ref 3.87–5.11)
RDW: 15.9 % — ABNORMAL HIGH (ref 11.5–15.5)
WBC: 9.1 10*3/uL (ref 4.0–10.5)
nRBC: 0 % (ref 0.0–0.2)

## 2020-05-10 LAB — PROTIME-INR
INR: 1.6 — ABNORMAL HIGH (ref 0.8–1.2)
Prothrombin Time: 18.8 seconds — ABNORMAL HIGH (ref 11.4–15.2)

## 2020-05-10 MED ORDER — WARFARIN SODIUM 2 MG PO TABS
2.0000 mg | ORAL_TABLET | Freq: Once | ORAL | Status: AC
  Administered 2020-05-10: 2 mg via ORAL
  Filled 2020-05-10: qty 1

## 2020-05-10 NOTE — Plan of Care (Signed)

## 2020-05-10 NOTE — Progress Notes (Signed)
Inpatient Rehab Admissions Coordinator:   Received request from insurance company for updated therapy notes within 48 hours.  Will reach out to PT/OT to see if they can see patient today.   Estill Dooms, PT, DPT Admissions Coordinator [phone] 05/10/20  11:15 AM

## 2020-05-10 NOTE — Care Management Important Message (Signed)
Important Message  Patient Details  Name: Danielle Thomas MRN: [id number] Date of Birth: [birth year]   Medicare Important Message Given:  Yes     Kaia Depaolis P Natasa Stigall 05/10/2020, 3:00 PM

## 2020-05-10 NOTE — Progress Notes (Signed)
Physical Therapy Treatment Patient Details Name: Danielle HornsJoan P Thomas MRN: [id number] DOB: [birth year] Today's Date: 05/10/2020    History of Present Illness Pt is a 87 y.o. female with PMH significant for a-fib on Coumadin, HTN, restless leg syndrome. Patient was recently admitted from 03/27/2020-03/29/2020 for multiple electrolyte abnormalities. She was also found to have a T11 compression fracture which was managed with pain medication and TLSO brace. She was discharged to SNF with plan for outpatient follow-up, however returned to the ED with uncontrolled back pain. This patient underwent T10-T11 fusion on 05/04/2020 per Dr. Franky Machoabbell.    PT Comments    Pt progressing with post-op mobility. She was able to demonstrate transfers and ambulation with gross min guard assist to min assist and RW for support. Pt tried to get comfortable in the chair, however pt unable and very painful, even with multiple pillow supports. Pt with scoliosis and when sitting up has a R trunk lean and increased pressure through R hip causing pain. In chair, pt impulsively standing up to relieve pressure/pain in her back and it was decided to defer the chair and return to bed at this time. This patient continues to be an excellent CIR candidate as she is motivated to participate with therapy. Will continue to follow.     Follow Up Recommendations  CIR     Equipment Recommendations  Rolling walker with 5" wheels;3in1 (PT)    Recommendations for Other Services       Precautions / Restrictions Precautions Precautions: Fall;Back Precaution Booklet Issued: Yes (comment) Precaution Comments: LSO in room, newest order for LSO brace. Pt previously wearing TLSO prior to admission Required Braces or Orthoses: Spinal Brace Spinal Brace: Lumbar corset;Applied in sitting position Restrictions Weight Bearing Restrictions: No    Mobility  Bed Mobility Overal bed mobility: Needs Assistance Bed Mobility: Rolling;Sidelying to  Sit;Sit to Sidelying Rolling: Supervision Sidelying to sit: Min assist     Sit to sidelying: Min guard General bed mobility comments: Pt restless in bed at times, rolling R and L to try and show me what positions are comfortable and which are not. Pt required assist to elevate trunk to full sitting position but was able to return to sidelying without assist this session.  Transfers Overall transfer level: Needs assistance Equipment used: Rolling walker (2 wheeled) Transfers: Sit to/from Stand Sit to Stand: Min guard         General transfer comment: Pt required increased time to initiate movement, however performed sit>stand quickly - reportedly to minimize pain. Pt required hands on guarding throughout for safety.  Ambulation/Gait Ambulation/Gait assistance: Min guard;Min assist Gait Distance (Feet): 50 Feet (25 feet x2) Assistive device: Rolling walker (2 wheeled) Gait Pattern/deviations: Step-to pattern;Step-through pattern;Decreased stride length;Wide base of support Gait velocity: Decreased Gait velocity interpretation: <1.31 ft/sec, indicative of household ambulator General Gait Details: Pt asked to stay in the room only, and ambulated ~50 feet with RW and hands-in guarding. 1 seated rest break required. Occasional assist for walker management around tight areas.   Stairs             Wheelchair Mobility    Modified Rankin (Stroke Patients Only)       Balance Overall balance assessment: Needs assistance Sitting-balance support: Feet supported Sitting balance-Leahy Scale: Fair Sitting balance - Comments: reliant on B UE support sitting EOB due to pain   Standing balance support: Bilateral upper extremity supported Standing balance-Leahy Scale: Poor Standing balance comment: reliant on UE support for dynamic tasks  Cognition Arousal/Alertness: Awake/alert Behavior During Therapy: Impulsive Overall Cognitive Status:  Impaired/Different from baseline Area of Impairment: Following commands;Problem solving;Safety/judgement;Awareness                   Current Attention Level: Selective Memory: Decreased recall of precautions Following Commands: Follows one step commands with increased time;Follows multi-step commands inconsistently Safety/Judgement: Decreased awareness of safety Awareness: Emergent Problem Solving: Slow processing;Requires verbal cues General Comments: Impulsive at times however it seems part of that is the patient trying desperately to find a position that is not painful.      Exercises      General Comments        Pertinent Vitals/Pain Pain Assessment: Faces Faces Pain Scale: Hurts whole lot Pain Location: back Pain Descriptors / Indicators: Guarding;Grimacing Pain Intervention(s): Limited activity within patient's tolerance;Monitored during session;Repositioned    Home Living                      Prior Function            PT Goals (current goals can now be found in the care plan section) Acute Rehab PT Goals Patient Stated Goal: pain control PT Goal Formulation: With patient Time For Goal Achievement: 05/19/20 Potential to Achieve Goals: Fair Progress towards PT goals: Progressing toward goals    Frequency    Min 4X/week      PT Plan Current plan remains appropriate    Co-evaluation              AM-PAC PT "6 Clicks" Mobility   Outcome Measure  Help needed turning from your back to your side while in a flat bed without using bedrails?: None Help needed moving from lying on your back to sitting on the side of a flat bed without using bedrails?: A Little Help needed moving to and from a bed to a chair (including a wheelchair)?: A Little Help needed standing up from a chair using your arms (e.g., wheelchair or bedside chair)?: A Little Help needed to walk in hospital room?: A Little Help needed climbing 3-5 steps with a railing? : A  Lot 6 Click Score: 18    End of Session Equipment Utilized During Treatment: Gait belt;Back brace Activity Tolerance: Patient limited by pain Patient left: in bed;with call bell/phone within reach;with bed alarm set Nurse Communication: Mobility status PT Visit Diagnosis: Other abnormalities of gait and mobility (R26.89);Pain Pain - part of body:  (back)     Time: 0940-1005 PT Time Calculation (min) (ACUTE ONLY): 25 min  Charges:  $Gait Training: 23-37 mins                     Conni Slipper, PT, DPT Acute Rehabilitation Services Pager: [phone] Office: [phone]    Marylynn Pearson 05/10/2020, 11:40 AM

## 2020-05-10 NOTE — Progress Notes (Signed)
PROGRESS NOTE    Danielle Thomas  [id number] DOB: 04-26-1928 DOA: 05/01/2020 PCP: Cari Caraway, MD  Brief Narrative:  HPI per Dr. Zada Finders on 05/01/20 Danielle Thomas is a 87 y.o. female with medical history significant for atrial fibrillation on Coumadin, hypertension, restless leg syndrome who presents to the ED for evaluation of uncontrolled back pain.  Patient was recently admitted from 03/27/2020-03/29/2020 for multiple electrolyte abnormalities.  She was also found to have a T11 compression fracture which was managed with pain medication and TLSO brace.  She was discharged to SNF with plan for outpatient follow-up.  Patient states she was in SNF for 2 weeks before returning home.  She says that since she has been in the hospital she has been essentially bedbound due to difficulty ambulating secondary to continued back pain.  She says previously she would ambulate with the use of a walker.  She currently lives alone.  She otherwise denies any focal deficits or new weakness in her extremities.  ED Course:  Initial vitals showed BP 180/92, pulse 83, RR 16, temp 97.8 F, SPO2 97% on room air.  Labs show sodium 134, potassium 2.9, bicarb 30, BUN 22, creatinine 0.90, serum glucose 130, WBC 11.3, hemoglobin 16.9, platelets 232,000, INR 2.8.  SARS-CoV-2 PCR panel is ordered and pending.  Right rib x-rays negative for displaced rib fracture or other acute abnormality of the right ribs.  No pneumothorax or pleural effusion seen.  MRI thoracic and lumbar spine without contrast showed progressed T11 compression fracture with loss of height increased from 20% to 40%.  New retropulsion of bone resulting in mild spinal stenosis with mild cord mass-effect but no spinal cord signal abnormality also noted.  Acute to subacute posterior left rib fractures 9 through 11 are seen.  No acute osseous abnormality in the lumbar spine seen.  Patient was given IV morphine 6 mg and 4 mg, IV  Dilaudid 0.5 mg, K 40 mEq orally.  EDP discussed with on-call neurosurgery, Dr. Christella Noa who recommended medical admission for further neurosurgery evaluation and potential kyphoplasty after the weekend.  The hospitalist service was consulted to admit for further evaluation management.  **Interim History  Patient underwent surgical intervention on 05/04/2020 by Dr. Christella Noa.  She is postoperative day 5 and still complains of some moderate back pain.  She states that her appetite is diminished.  Will need PT OT to evaluate for safe discharge disposition and they evaluated today and they are recommending CIR and 24-hour supervision and SNF if she is unable to go to CIR.  Will consult CIR for evaluation. Neurosurgery recommending continuing brace while ambulating and continuing pain control. After discussion with Neurosurgery Dr. Christella Noa recommended resuming Anticoagulation 48 hours after surgery and her Coumadin has been resumed but heer INR still remains subtherapeutic but slightly improving and has gone from 1.2 and is now 1.4. We will change Abx to po and stop after 7 days and she has completed course  Her pain was uncontrolled today we will give her a one-time dose of IV fentanyl and continue with p.o. hydrocodone-acetaminophen and schedule oxycodone.  We will also try Robaxin and she has not had a bowel movement in a few days now so we will adjust her bowel regimen and start her on scheduled MiraLAX as well as continuing scheduled senna docusate and giving her a bisacodyl suppository if necessary.  She is also complaining of some rectal burning so we will try some Anusol rectal cream.  She remained stable to  be discharged to CIR.  Assessment & Plan:   Principal Problem:   Compression fracture of T11 vertebra (HCC) Active Problems:   Atrial fibrillation (HCC)   Essential hypertension, benign   Hypokalemia   Multiple fractures of ribs, left side, initial encounter for closed fracture   Closed wedge  compression fracture of T11 vertebra (HCC)  Uncontrolled back pain due to progressed T11 compression fracture s/p thoracic 10-11 arthrodesis, allograft morselized, BMP, thoracic 9 thoracic 12 percutaneous pedicle screws postoperative day 6 -Continue TLSO brace, pain control.   -Patient has a known T11 compression fracture and following with orthopedics as an outpatient.   -MRI done here showed progression of the compression fracture, increased loss of height. It also showed new retropulsion of bone resulting in mild spinal stenosis with mild cord mass effect but no spinal cord signal abnormality  -Dr. Christella Noa, Neurosurgery was consulted and planning on percutaneous pedicle screws and a T10 to T12 arthrodesis today and she is POD6. -Consult  PT/OT after procedure and they are recommending CIR; CIR consulted for further evaluation and awaiting disposition -Added Robaxin and will continue with IV every 6 hours as needed. -Pain control with cyclobenzaprine 10 mg p.o. 3 times daily as needed muscle spasms, hydrocodone-acetaminophen 1-2 tabs p.o. every 4 hours as needed for moderate pain, methocarbamol 500 g IV every 6 hours as needed for muscle spasms as well, oxycodone 10 mg every 12 hours scheduled -Continue with bowel regimen -Patient continues to have moderate back pain but neurosurgery feels that she is doing well and they are recommending continuing the brace while she is ambulating; they are recommending her to go to CIR once bed is available.  Her pain was uncontrolled yesterday so she was given one-time dose of IV fentanyl 25 mcg  Atrial Fibrillation -On Coumadin as an outpatient.  INR 2.8 and trended up to 4.0 and is now 1.6 today .   -Held Coumadin for her surgical intervention.  On Coumadin for anticoagulation.   -Since INR was 4,neurosugery ordered oral Vitamin K for surgery.  After vitamin K INR is trended down to 1.4 and PT is 16.5 -Anticoagulation resumed 48 hours after surgery and  pharmacy is managing and adjusting dosing  Acute to subacute left 9-11 rib fractures: -Seen on MRI.  Continue pain control as above and supportive Care -Incentive spirometer. -She may have fallen at home -Continue to Monitor carefully   Hypertension -Continue losartan.  HCTZ was on hold due to hypokalemia but was resumed yesterday at 10 AM Been she is continuing hydrochlorothiazide 12.5 g daily and losartan 50 mg daily -Continue to monitor blood pressures per protocol -Last blood pressure reading was elevated at 127/83 -We will add hydralazine 10 mg IV every 6 for systolic blood pressure greater than 0000000 or diastolic blood pressure and 90  Hypokalemia -Improved and potassium is now 3.6 and will replete with p.o. KCl 40 mEq x 1 the day before yesterday -Magnesium level was 1.8 and will replete with IV mag sulfate 2 g x 1  The day before yesterday -IVF is now discontinued  -Continue monitor and replete as necessary -Repeat CMP in a.m.  Hypophosphatemia -Patient's Phos Level was 3.0 yesterday  -Continue to Monitor and Trend and replete as necessary -Repeat CMP in the AM  Hyponatremia -Mild -Patient's sodium had dropped from 137 -> 133 -> 134 and is now back up to 135 again the day before yesterday  -continue IV fluid hydration as above -Continue to monitor and trend and repeat  CMP in a.m.  Restless Leg Syndrome -Continue Ropinirole 0.25 mg p.o. twice daily as needed for restless legs.  Glaucoma -Continue Dorzolamide 1 drop both eyes twice daily as well as latanoprost 1 drop in both eyes nightly  Leukocytosis in the setting of fall and E. coli UTI, poA and now improving  E Coli UTI, poA -She is Afebrile; WBC improved to 9.1 on last check on 05/09/2019 -U/A Showed turbid appearance with amber color urine, large hemoglobin, moderate leukocytes, negative nitrites, many bacteria, 0-5 non-squamous epithelial cells, greater than 50 RBCs per high-power field, 0-5 squamous epithelial  cells, and greater than 50 WBCs  -Urine culture was obtained and was positive for E. 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  -No signs of bowel ischemia. -Need ambulatory referral to vascular surgery   Constipation -Has not had a bowel movement in few days so we will adjust her bowel regimen and likely in the setting of opioid usage -She has 5 mg of bisacodyl daily as needed for moderate constipation as well as 10 mg rectally daily as needed for moderate constipation and we have started her on scheduled bowel regimen with MiraLAX 17 g p.o. twice daily as well as senna docusate 1 tab p.o. twice daily  Rectal burning -Could be from straining and could be from hemorrhoids -We will start hydrocortisone 2.5% rectal cream twice daily  FTT -Patient report was previously independent prior to hospitalization in November -However she is not able to get out of bed due to pain currently -Nutritionist consulted and recommending Ensure Enlive po BID -Will need PT OT eval once pain improves and she is now being recommended for CIR  Overweight -Estimated body mass index is 29.48 kg/m as calculated from the following:   Height as of this encounter: 5\' 3"  (1.6 m).   Weight as of this encounter: 75.5 kg. -Nutritonist consulted and recommending continuing Ensure Enlive po BID and MVI + Minerals Daily  DVT prophylaxis: SCDs; Will resume Anticoagulation today  Code Status: Partial  Family Communication: Discussed with son at bedside Disposition Plan: PT and OT evaluation recommending CIR vs SNF and clearance by neurosurgery; CIR is evaluating and currently she remains medically stable to be discharged when bed is available.   Status  is: Inpatient  Remains inpatient appropriate because:Unsafe d/c plan, IV treatments appropriate due to intensity of illness or inability to take PO and Inpatient level of care appropriate due to severity of illness   Dispo: The patient is from: Home              Anticipated d/c is to: CIR              Anticipated d/c date is: 1-2 days              Patient currently is medically stable to d/c.  Consultants:   Neurosurgery Dr. Christella Noa  CIR   Procedures:  PROCEDURE by Dr. Ashok Pall:  Procedure(s): thoracic 10-11 arthrodesis, allograft morsels, bmp Thoracic nine to Thoracic twelve percutaneous pedicle screws For segmental pedicle screw fixation  Antimicrobials:  Anti-infectives (From admission, onward)   Start     Dose/Rate Route Frequency Ordered Stop   05/06/20 2100  cephALEXin (KEFLEX) capsule 250 mg  250 mg Oral Every 6 hours 05/06/20 1350 05/09/20 1714   05/04/20 0615  ceFAZolin (ANCEF) IVPB 2g/100 mL premix        2 g 200 mL/hr over 30 Minutes Intravenous On call to O.R. 05/04/20 0517 05/04/20 1008   05/02/20 2200  cefTRIAXone (ROCEPHIN) 1 g in sodium chloride 0.9 % 100 mL IVPB  Status:  Discontinued        1 g 200 mL/hr over 30 Minutes Intravenous Every 24 hours 05/02/20 1919 05/06/20 1350   05/02/20 2015  cefTRIAXone (ROCEPHIN) 1 g in sodium chloride 0.9 % 100 mL IVPB  Status:  Discontinued        1 g 200 mL/hr over 30 Minutes Intravenous Every 24 hours 05/02/20 1918 05/02/20 1919        Subjective: Seen and examined at bedside   Objective: Vitals:   05/09/20 1636 05/09/20 2052 05/10/20 0426 05/10/20 0722  BP: 127/69 138/73 132/83 127/83  Pulse: 99 99 (!) 103 96  Resp: 19 16 16 16   Temp: 98.3 F (36.8 C) 98.9 F (37.2 C) 98.4 F (36.9 C) 97.6 F (36.4 C)  TempSrc:  Oral Oral Oral  SpO2: 96% 98% 98% 96%  Weight:      Height:        Intake/Output Summary (Last 24 hours) at 05/10/2020 1332 Last data filed at 05/10/2020 0500 Gross per 24 hour   Intake --  Output 550 ml  Net -550 ml   Filed Weights   05/03/20 0529 05/07/20 0634 05/09/20 0500  Weight: 69.5 kg 73.1 kg 75.5 kg   Examination: Physical Exam:  Constitutional: WN/WD Caucasian female currently fluid just ambulated physical therapy and wanting to rest.  Appeared mildly uncomfortable and complained of pain Eyes: Lids and conjunctivae normal, sclerae anicteric  ENMT: External Ears, Nose appear normal. Grossly normal hearing.  Neck: Appears normal, supple, no cervical masses, normal ROM, no appreciable thyromegaly; no JVD Respiratory: Diminished to auscultation bilaterally with coarse breath sounds, no wheezing, rales, rhonchi or crackles. Normal respiratory effort and patient is not tachypenic. No accessory muscle use.  Unlabored breathing Cardiovascular: RRR, no murmurs / rubs / gallops. S1 and S2 auscultated.  Trace extremity edema Abdomen: Soft, non-tender, distended secondary body habitus. Bowel sounds positive.  GU: Deferred. Musculoskeletal: No clubbing / cyanosis of digits/nails. No joint deformity upper and lower extremities.  Skin: No rashes, lesions, ulcers on limited skin evaluation. No induration; Warm and dry.  Neurologic: CN 2-12 grossly intact with no focal deficits. Romberg sign and cerebellar reflexes not assessed.  Psychiatric: Normal judgment and insight. Alert and oriented x 3. Normal mood and appropriate affect.   Data Reviewed: I have personally reviewed following labs and imaging studies  CBC: Recent Labs  Lab 05/04/20 0550 05/05/20 0212 05/06/20 0137 05/07/20 0301 05/08/20 0255 05/10/20 0444  WBC 10.0 14.5* 13.8* 11.4* 9.1 9.1  NEUTROABS 7.3 12.7* 11.5* 8.6* 6.9  --   HGB 16.6* 14.5 13.0 12.6 12.3 12.9  HCT 51.8* 42.3 38.3 37.3 38.3 37.4  MCV 92.3 89.6 89.5 89.2 91.6 88.4  PLT 198 216 155 200 193 [id number]   Basic Metabolic Panel: Recent Labs  Lab 05/04/20 0550 05/05/20 0212 05/06/20 0137 05/07/20 0301 05/08/20 0255  NA 137 133*  134* 135 135  K 4.1 5.1 3.9 4.3 3.6  CL 96* 99 99 100 101  CO2 30 23 25 27 26   GLUCOSE 98 137* 117* 112* 101*  BUN 23 19 20 20 16   CREATININE 1.01* 0.87 0.83  0.75 0.70  CALCIUM 8.9 8.3* 8.2* 8.0* 7.8*  MG 1.9 1.7 1.7 2.0 1.8  PHOS 3.3 2.8 2.6 1.9* 3.0   GFR: Estimated Creatinine Clearance: 45.5 mL/min (by C-G formula based on SCr of 0.7 mg/dL). Liver Function Tests: Recent Labs  Lab 05/04/20 0550 05/05/20 0212 05/06/20 0137 05/07/20 0301 05/08/20 0255  AST 28 41 32 23 19  ALT 35 30 26 21 20   ALKPHOS 74 67 59 53 44  BILITOT 0.8 1.1 1.0 1.1 0.8  PROT 5.8* 5.2* 5.1* 5.1* 4.8*  ALBUMIN 3.0* 2.8* 2.6* 2.4* 2.3*   No results for input(s): LIPASE, AMYLASE in the last 168 hours. No results for input(s): AMMONIA in the last 168 hours. Coagulation Profile: Recent Labs  Lab 05/06/20 0137 05/07/20 0301 05/08/20 0255 05/09/20 0151 05/10/20 0444  INR 1.2 1.2 1.2 1.4* 1.6*   Cardiac Enzymes: No results for input(s): CKTOTAL, CKMB, CKMBINDEX, TROPONINI in the last 168 hours. BNP (last 3 results) No results for input(s): PROBNP in the last 8760 hours. HbA1C: No results for input(s): HGBA1C in the last 72 hours. CBG: No results for input(s): GLUCAP in the last 168 hours. Lipid Profile: No results for input(s): CHOL, HDL, LDLCALC, TRIG, CHOLHDL, LDLDIRECT in the last 72 hours. Thyroid Function Tests: No results for input(s): TSH, T4TOTAL, FREET4, T3FREE, THYROIDAB in the last 72 hours. Anemia Panel: No results for input(s): VITAMINB12, FOLATE, FERRITIN, TIBC, IRON, RETICCTPCT in the last 72 hours. Sepsis Labs: No results for input(s): PROCALCITON, LATICACIDVEN in the last 168 hours.  Recent Results (from the past 240 hour(s))  Resp Panel by RT-PCR (Flu A&B, Covid) Nasopharyngeal Swab     Status: None   Collection Time: 05/01/20  6:23 PM   Specimen: Nasopharyngeal Swab; Nasopharyngeal(NP) swabs in vial transport medium  Result Value Ref Range Status   SARS Coronavirus 2 by  RT PCR NEGATIVE NEGATIVE Final    Comment: (NOTE) SARS-CoV-2 target nucleic acids are NOT DETECTED.  The SARS-CoV-2 RNA is generally detectable in upper respiratory specimens during the acute phase of infection. The lowest concentration of SARS-CoV-2 viral copies this assay can detect is 138 copies/mL. A negative result does not preclude SARS-Cov-2 infection and should not be used as the sole basis for treatment or other patient management decisions. A negative result may occur with  improper specimen collection/handling, submission of specimen other than nasopharyngeal swab, presence of viral mutation(s) within the areas targeted by this assay, and inadequate number of viral copies(<138 copies/mL). A negative result must be combined with clinical observations, patient history, and epidemiological information. The expected result is Negative.  Fact Sheet for Patients:  EntrepreneurPulse.com.au  Fact Sheet for Healthcare Providers:  IncredibleEmployment.be  This test is no t yet approved or cleared by the [place] FDA and  has been authorized for detection and/or diagnosis of SARS-CoV-2 by FDA under an Emergency Use Authorization (EUA). This EUA will remain  in effect (meaning this test can be used) for the duration of the COVID-19 declaration under Section 564(b)(1) of the Act, 21 U.S.C.section 360bbb-3(b)(1), unless the authorization is terminated  or revoked sooner.       Influenza A by PCR NEGATIVE NEGATIVE Final   Influenza B by PCR NEGATIVE NEGATIVE Final    Comment: (NOTE) The Xpert Xpress SARS-CoV-2/FLU/RSV plus assay is intended as an aid in the diagnosis of influenza from Nasopharyngeal swab specimens and should not be used as a sole basis for treatment. Nasal washings and aspirates are unacceptable for Xpert Xpress SARS-CoV-2/FLU/RSV testing.  Fact Sheet for Patients: BloggerCourse.com  Fact Sheet  for Healthcare Providers: SeriousBroker.it  This test is not yet approved or cleared by the [place] FDA and has been authorized for detection and/or diagnosis of SARS-CoV-2 by FDA under an Emergency Use Authorization (EUA). This EUA will remain in effect (meaning this test can be used) for the duration of the COVID-19 declaration under Section 564(b)(1) of the Act, 21 U.S.C. section 360bbb-3(b)(1), unless the authorization is terminated or revoked.  Performed at [facility], [address] [place], [place] [address]   Culture, Urine     Status: Abnormal   Collection Time: 05/02/20  7:18 PM   Specimen: Urine, Catheterized  Result Value Ref Range Status   Specimen Description   Final    URINE, CATHETERIZED Performed at [facility], [address] [place], [place] [address]    Special Requests   Final    NONE Performed at [facility], [address], [place], [place] [address]    Culture >=100,000 COLONIES/mL ESCHERICHIA COLI (A)  Final   Report Status 05/04/2020 FINAL  Final   Organism ID, Bacteria ESCHERICHIA COLI (A)  Final      Susceptibility   Escherichia coli - MIC*    AMPICILLIN <=2 SENSITIVE Sensitive     CEFAZOLIN <=4 SENSITIVE Sensitive     CEFEPIME <=0.12 SENSITIVE Sensitive     CEFTRIAXONE <=0.25 SENSITIVE Sensitive     CIPROFLOXACIN <=0.25 SENSITIVE Sensitive     GENTAMICIN <=1 SENSITIVE Sensitive     IMIPENEM <=0.25 SENSITIVE Sensitive     NITROFURANTOIN <=16 SENSITIVE Sensitive     TRIMETH/SULFA <=20 SENSITIVE Sensitive     AMPICILLIN/SULBACTAM <=2 SENSITIVE Sensitive     PIP/TAZO <=4 SENSITIVE Sensitive     * >=100,000 COLONIES/mL ESCHERICHIA COLI  Surgical pcr screen     Status: Abnormal   Collection Time: 05/04/20  5:37 AM   Specimen: Nasal Mucosa; Nasal Swab  Result Value Ref Range Status   MRSA, PCR NEGATIVE NEGATIVE Final   Staphylococcus aureus POSITIVE (A)  NEGATIVE Final    Comment: (NOTE) The Xpert SA Assay (FDA approved for NASAL specimens in patients 45 years of age and older), is one component of a comprehensive surveillance program. It is not intended to diagnose infection nor to guide or monitor treatment. Performed at [facility], [address] [place], [place] [address]      RN Pressure Injury Documentation:     Estimated body mass index is 29.48 kg/m as calculated from the following:   Height as of this encounter: 5\' 3"  (1.6 m).   Weight as of this encounter: 75.5 kg.  Malnutrition Type:  Nutrition Problem: Inadequate oral intake Etiology: poor appetite   Malnutrition Characteristics:  Signs/Symptoms: per patient/family report   Nutrition Interventions:  Interventions: Ensure Enlive (each supplement provides 350kcal and 20 grams of protein)     Radiology Studies: No results found. Scheduled Meds: . dorzolamide  1 drop Both Eyes BID  . feeding supplement  237 mL Oral BID BM  . hydrocortisone   Rectal BID  . latanoprost  1 drop Both Eyes QHS  . losartan  50 mg Oral Daily  . multivitamin with minerals  1 tablet Oral Daily  . oxyCODONE  10 mg Oral Q12H  . polyethylene glycol  17 g Oral BID  . senna-docusate  1 tablet Oral BID  . sodium chloride flush  3 mL Intravenous Q12H  . warfarin  2  mg Oral ONCE-1600  . Warfarin - Pharmacist Dosing Inpatient   Does not apply q1600   Continuous Infusions: . sodium chloride Stopped (05/02/20 2103)  . sodium chloride      LOS: 9 days   Kerney Elbe, DO [facility] PAGER is on [place]  If 7PM-7AM, please contact night-coverage [email or web address]

## 2020-05-10 NOTE — Progress Notes (Signed)
Occupational Therapy Treatment Patient Details Name: Danielle Thomas MRN: [id number] DOB: 09/02/1927 Today's Date: 05/10/2020    History of present illness Pt is a 87 y.o. female with PMH significant for a-fib on Coumadin, HTN, restless leg syndrome. Patient was recently admitted from 03/27/2020-03/29/2020 for multiple electrolyte abnormalities. She was also found to have a T11 compression fracture which was managed with pain medication and TLSO brace. She was discharged to SNF with plan for outpatient follow-up, however returned to the ED with uncontrolled back pain. This patient underwent T10-T11 fusion on 05/04/2020 per Dr. Christella Noa.   OT comments  Pt with poor recall of back precautions during functional ADLS and can benefit from CIR at this time. Pt progressing toward goals and better pain control this session. Recommendation remains CIR.    Follow Up Recommendations  CIR;Supervision/Assistance - 24 hour;Other (comment)    Equipment Recommendations  3 in 1 bedside commode;Other (comment)    Recommendations for Other Services Rehab consult    Precautions / Restrictions Precautions Precautions: Fall;Back Precaution Booklet Issued: Yes (comment) Precaution Comments: LSO Required Braces or Orthoses: Spinal Brace Spinal Brace: Lumbar corset;Applied in sitting position Restrictions Weight Bearing Restrictions: No       Mobility Bed Mobility Overal bed mobility: Needs Assistance Bed Mobility: Rolling;Sidelying to Sit;Sit to Sidelying Rolling: Supervision Sidelying to sit: Min assist   Sit to supine: Min assist Sit to sidelying: Min assist General bed mobility comments: cues for back precautions. pt attempting to lay back then lift legs instead of down on her side  Transfers Overall transfer level: Needs assistance Equipment used: Rolling walker (2 wheeled) Transfers: Sit to/from Stand Sit to Stand: Min guard         General transfer comment: Pt required increased time  to initiate movement, however performed sit>stand quickly - reportedly to minimize pain. Pt required hands on guarding throughout for safety.    Balance Overall balance assessment: Needs assistance Sitting-balance support: Feet supported Sitting balance-Leahy Scale: Fair Sitting balance - Comments: reliant on B UE support sitting EOB due to pain   Standing balance support: Bilateral upper extremity supported Standing balance-Leahy Scale: Poor Standing balance comment: reliant on UE support for dynamic tasks                           ADL either performed or assessed with clinical judgement   ADL Overall ADL's : Needs assistance/impaired     Grooming: Min guard;Standing Grooming Details (indicate cue type and reason): mod cues to wash hands and not just exit the bathroom             Lower Body Dressing: Moderate assistance;Sit to/from stand Lower Body Dressing Details (indicate cue type and reason): pt able to figure 4 cross R le but unable to do L LE with brief. pt needs cues to avoid bending over for back precautions Toilet Transfer: Paramedic Details (indicate cue type and reason): pt requires cues for hand placement Toileting- Clothing Manipulation and Hygiene: Minimal assistance       Functional mobility during ADLs: Min guard;Rolling walker General ADL Comments: pt with poor recall of back precautiosn with adls. pt also HOH and needs visual redirection at times     Vision       Perception     Praxis      Cognition Arousal/Alertness: Awake/alert Behavior During Therapy: Flat affect Overall Cognitive Status: Impaired/Different from baseline Area of Impairment: Memory;Safety/judgement;Awareness  Current Attention Level: Selective Memory: Decreased recall of precautions;Decreased short-term memory Following Commands: Follows one step commands inconsistently;Follows one step commands with  increased time Safety/Judgement: Decreased awareness of safety;Decreased awareness of deficits Awareness: Emergent Problem Solving: Slow processing;Difficulty sequencing General Comments: PT wet pullup during nap and wanting to continue to wear the brief. Pt educated on risk for UTI with wearing the same wet pullup. pt states but I am worried ill run out. OT asked her if it was wet she said no OT addressed this by telling it looks and feels wet and pt then states it was but was denying it to prevent me from throwing it away. Please check her frequently as she will false report to make sure "she doesn't run out". pt no recall of back precautions and needs max (A) to don brace        Exercises     Shoulder Instructions       General Comments risk for UTI due to wet brief and not wanting to take off wet diaper    Pertinent Vitals/ Pain       Pain Assessment: Faces Faces Pain Scale: Hurts a little bit Pain Location: back Pain Descriptors / Indicators: Sore Pain Intervention(s): Monitored during session;Premedicated before session;Repositioned  Home Living                                          Prior Functioning/Environment              Frequency  Min 2X/week        Progress Toward Goals  OT Goals(current goals can now be found in the care plan section)  Progress towards OT goals: Progressing toward goals  Acute Rehab OT Goals Patient Stated Goal: to go to the bathroom OT Goal Formulation: With patient Time For Goal Achievement: 05/19/20 Potential to Achieve Goals: Good ADL Goals Pt Will Perform Grooming: with modified independence;standing Pt Will Perform Lower Body Bathing: with modified independence;sit to/from stand;with adaptive equipment Pt Will Perform Lower Body Dressing: with modified independence;with adaptive equipment;sit to/from stand;bed level Pt Will Transfer to Toilet: with modified independence;ambulating;bedside commode Pt Will  Perform Toileting - Clothing Manipulation and hygiene: with modified independence;sit to/from stand Additional ADL Goal #1: Pt to verbalize 3/3 back precautions Independently  Plan Discharge plan remains appropriate    Co-evaluation                 AM-PAC OT "6 Clicks" Daily Activity     Outcome Measure   Help from another person eating meals?: A Little Help from another person taking care of personal grooming?: A Little Help from another person toileting, which includes using toliet, bedpan, or urinal?: A Little Help from another person bathing (including washing, rinsing, drying)?: A Little Help from another person to put on and taking off regular upper body clothing?: A Little Help from another person to put on and taking off regular lower body clothing?: A Little 6 Click Score: 18    End of Session Equipment Utilized During Treatment: Rolling walker;Back brace  OT Visit Diagnosis: Unsteadiness on feet (R26.81);Other abnormalities of gait and mobility (R26.89);Muscle weakness (generalized) (M62.81);Pain   Activity Tolerance Patient tolerated treatment well   Patient Left in bed;with call bell/phone within reach;with bed alarm set   Nurse Communication Mobility status;Precautions        Time: 1448-1500 OT Time Calculation (min): 12  min  Charges: OT General Charges $OT Visit: 1 Visit OT Treatments $Self Care/Home Management : 8-22 mins   Brynn, OTR/L  Acute Rehabilitation Services Pager: [phone] Office: [phone] .    Jeri Modena 05/10/2020, 3:19 PM

## 2020-05-10 NOTE — Progress Notes (Signed)
ANTICOAGULATION CONSULT NOTE - Follow Up Consult  Pharmacy Consult for Warfarin Indication: atrial fibrillation  Allergies  Allergen Reactions  . Amlodipine Besylate     Other reaction(s): high dose make feet swell  . Codeine Nausea And Vomiting  . Meperidine Nausea Only  . Metoprolol Succinate [Metoprolol]     Other reaction(s): effects her breathing  . Oxycodone Nausea And Vomiting    Patient Measurements: Height: 5\' 3"  (160 cm) Weight: 75.5 kg (166 lb 7.2 oz) IBW/kg (Calculated) : 52.4   Vital Signs: Temp: 97.6 F (36.4 C) (01/03 0722) Temp Source: Oral (01/03 0722) BP: 127/83 (01/03 0722) Pulse Rate: 96 (01/03 0722)  Labs: Recent Labs    05/08/20 0255 05/09/20 0151 05/10/20 0444  HGB 12.3  --  12.9  HCT 38.3  --  37.4  PLT 193  --  190  LABPROT 15.1 16.5* 18.8*  INR 1.2 1.4* 1.6*  CREATININE 0.70  --   --     Estimated Creatinine Clearance: 45.5 mL/min (by C-G formula based on SCr of 0.7 mg/dL).   Medical History: Past Medical History:  Diagnosis Date  . Hypertension     Medications:  Medications Prior to Admission  Medication Sig Dispense Refill Last Dose  . dorzolamide (TRUSOPT) 2 % ophthalmic solution Place 1 drop into both eyes 2 (two) times daily.    05/01/2020 at Unknown time  . hydrochlorothiazide (MICROZIDE) 12.5 MG capsule Take 1 capsule (12.5 mg total) by mouth daily. *Please call and schedule a one year follow up appointment* (Patient taking differently: Take 12.5 mg by mouth daily.) 30 capsule 0 05/01/2020 at Unknown time  . losartan (COZAAR) 50 MG tablet Take 50 mg by mouth daily.   05/01/2020 at Unknown time  . methocarbamol (ROBAXIN) 500 MG tablet Take 250 mg by mouth daily as needed for muscle spasms.   05/01/2020 at Unknown time  . oxyCODONE-acetaminophen (PERCOCET/ROXICET) 5-325 MG tablet Take 1 tablet by mouth every 4 (four) hours as needed for severe pain.   05/01/2020 at Unknown time  . rOPINIRole (REQUIP) 0.25 MG tablet Take 0.25  mg by mouth 2 (two) times daily as needed (restless legs). Restless leg   04/30/2020 at Unknown time  . travoprost, benzalkonium, (TRAVATAN) 0.004 % ophthalmic solution Place 1 drop into both eyes at bedtime.    04/30/2020 at Unknown time  . warfarin (COUMADIN) 1 MG tablet Take 1 mg by mouth every other day.   04/29/2020 at 7 pm  . warfarin (COUMADIN) 3 MG tablet Take 1.5 mg by mouth every other day.   04/30/2020 at 7 pm  . amLODipine (NORVASC) 10 MG tablet Take 1 tablet (10 mg total) by mouth daily. (Patient not taking: Reported on 05/01/2020)   Not Taking at Unknown time  . polyethylene glycol (MIRALAX / GLYCOLAX) 17 g packet Take 17 g by mouth 2 (two) times daily as needed for mild constipation (use first). (Patient not taking: No sig reported) 14 each 0 Not Taking at Unknown time  . senna-docusate (SENOKOT-S) 8.6-50 MG tablet Take 1 tablet by mouth 2 (two) times daily between meals as needed for mild constipation. (Patient not taking: No sig reported) 60 tablet 0 Not Taking at Unknown time    Assessment: 87 yo female admitted on 05/01/2020 with back pain found to have T11 compression fracture. Patient is s/p thoracic 10-11 arthrodesis with neurosurgery. Patient is on warfarin prior to admission for Afib which has been held. Pharmacy consulted to resume warfarin per Dr. 05/03/2020 and neurosurgery  on 05/06/20. S/p vitamin K 10mg  po on 12/27, in preparatio for surgery.    S/p T10-11 10-11 arthrodesis, percutaneous pedicle screws fixation12/28/21.  POD # 6 , INR 1.6 is subtherapeutic but trending up toward goal INR 2-3. CBC remains within normal limits and stable.  No reported bleeding.  INR on admission was therapeutic on prior to admission warfarin regimen per patient: alternating 1mg  and 1.5mg  every other night - no specific dates for each just alternating per patient. No anticoagulation clinic notes in [facility] or Care Everywhere.    Goal of Therapy:  INR 2-3 Monitor platelets by anticoagulation  protocol: Yes   Plan:  Warfarin 2 mg x1 tonight  Follow up INR tomorrow  Daily Protime /INR.  Nicole Cella, RPh Clinical Pharmacist Phone between 7 am - 3:30 pm: [phone] Please check AMION for all [place] phone numbers After 10:00 PM, call [place] [phone]  05/10/2020,8:48 AM

## 2020-05-10 NOTE — Plan of Care (Signed)
  Problem: Education: Goal: Knowledge of General Education information will improve Description Including pain rating scale, medication(s)/side effects and non-pharmacologic comfort measures Outcome: Progressing   Problem: Health Behavior/Discharge Planning: Goal: Ability to manage health-related needs will improve Outcome: Progressing   

## 2020-05-11 DIAGNOSIS — E876 Hypokalemia: Secondary | ICD-10-CM | POA: Diagnosis not present

## 2020-05-11 DIAGNOSIS — I10 Essential (primary) hypertension: Secondary | ICD-10-CM | POA: Diagnosis not present

## 2020-05-11 DIAGNOSIS — I480 Paroxysmal atrial fibrillation: Secondary | ICD-10-CM | POA: Diagnosis not present

## 2020-05-11 DIAGNOSIS — S22080A Wedge compression fracture of T11-T12 vertebra, initial encounter for closed fracture: Secondary | ICD-10-CM | POA: Diagnosis not present

## 2020-05-11 LAB — PROTIME-INR
INR: 1.9 — ABNORMAL HIGH (ref 0.8–1.2)
Prothrombin Time: 21.2 seconds — ABNORMAL HIGH (ref 11.4–15.2)

## 2020-05-11 MED ORDER — WARFARIN SODIUM 1 MG PO TABS
1.5000 mg | ORAL_TABLET | Freq: Once | ORAL | Status: AC
  Administered 2020-05-11: 1.5 mg via ORAL
  Filled 2020-05-11: qty 1

## 2020-05-11 NOTE — Progress Notes (Signed)
Inpatient Rehab Admissions Coordinator:   Updated therapy notes faxed to [facility].  Awaiting results of prior auth request.   Estill Dooms, PT, DPT Admissions Coordinator [phone] 05/11/20  11:52 AM

## 2020-05-11 NOTE — Progress Notes (Signed)
Physical Therapy Treatment Patient Details Name: Danielle Thomas MRN: [id number] DOB: 04-07-1928 Today's Date: 05/11/2020    History of Present Illness Pt is a 87 y.o. female with PMH significant for a-fib on Coumadin, HTN, restless leg syndrome. Patient was recently admitted from 03/27/2020-03/29/2020 for multiple electrolyte abnormalities. She was also found to have a T11 compression fracture which was managed with pain medication and TLSO brace. She was discharged to SNF with plan for outpatient follow-up, however returned to the ED with uncontrolled back pain. This patient underwent T10-T11 fusion on 05/04/2020 per Dr. Christella Noa.    PT Comments    PTA called into room as pt requesting assist for mobility to bathroom. Pt agreeable to therapy session, with good participation and tolerance for mobility, limited due to increased midline back pain (RN notified) and mild dizziness with mobility (some orthostatic hypotension, BP 166/106 seated and BP 139/101 standing). Pt performed bed mobility with min guard to minA, gait in room ~33ft total (including 2 seated breaks) with min guard to minA but needs verbal/tactile cues for compliance with log rolling and back precautions. Pt performed seated/standing BLE AROM therapeutic exercises with good tolerance. Pt continues to benefit from PT services to progress toward functional mobility goals. D/C recs below remain appropriate.  Follow Up Recommendations  CIR     Equipment Recommendations  Rolling walker with 5" wheels;3in1 (PT)    Recommendations for Other Services Rehab consult     Precautions / Restrictions Precautions Precautions: Fall;Back Precaution Booklet Issued: Yes (comment) (unable to recall) Precaution Comments: LSO Required Braces or Orthoses: Spinal Brace Spinal Brace: Lumbar corset;Applied in sitting position Restrictions Weight Bearing Restrictions: No    Mobility  Bed Mobility Overal bed mobility: Needs Assistance Bed  Mobility: Rolling;Sidelying to Sit;Sit to Sidelying Rolling: Supervision Sidelying to sit: Min guard     Sit to sidelying: Min assist General bed mobility comments: pt impulsive, min guard to minA for safety, verbal cues to not reach behind self when return to supine, educated on log roll technique to adhere to back precautions  Transfers Overall transfer level: Needs assistance Equipment used: Rolling walker (2 wheeled) Transfers: Sit to/from Stand Sit to Stand: Min guard         General transfer comment: verbal cues for safe hand placement ie. push up from bed and reach back for bed, despite max verbal cues pt continues to pull up on walker and hold onto walker when returning to sitting  Ambulation/Gait Ambulation/Gait assistance: Min guard;Min assist Gait Distance (Feet): 20 Feet (x3) Assistive device: Rolling walker (2 wheeled) Gait Pattern/deviations: Step-through pattern;Decreased stride length Gait velocity: dec Gait velocity interpretation: <1.31 ft/sec, indicative of household ambulator General Gait Details: minA for walker management around obstacles, reports dizziness with initial STS and able to perform pre-gait standing hip flexion x10, after seated break pt reporting dizziness resolved   Stairs             Wheelchair Mobility    Modified Rankin (Stroke Patients Only)       Balance Overall balance assessment: Needs assistance Sitting-balance support: Feet supported Sitting balance-Leahy Scale: Fair Sitting balance - Comments: pt able to attempt to don brace, no LOB but required assist to don properly   Standing balance support: Bilateral upper extremity supported Standing balance-Leahy Scale: Poor Standing balance comment: dependent on RW                            Cognition Arousal/Alertness:  Awake/alert Behavior During Therapy: Impulsive Overall Cognitive Status: Impaired/Different from baseline Area of Impairment:  Safety/judgement;Memory                     Memory: Decreased recall of precautions (unable to recall back precautions) Following Commands: Follows one step commands with increased time;Follows one step commands consistently (pt extremely HOH) Safety/Judgement: Decreased awareness of safety Awareness: Emergent Problem Solving: Requires verbal cues;Requires tactile cues General Comments: pt impulsive and quick to move, doesn't adhere to back precautions and is unable to don brace without assist      Exercises General Exercises - Lower Extremity Ankle Circles/Pumps: AROM;Strengthening;Both;10 reps;Seated Long Arc Quad: AROM;Strengthening;Both;10 reps;Seated Hip ABduction/ADduction: AROM;Strengthening;Both;10 reps;Seated;Other (comment) (pillow squeezes) Hip Flexion/Marching: AROM;Strengthening;Both;10 reps;Standing (standing at RW)    General Comments General comments (skin integrity, edema, etc.): Pt BP 166/106 seated EOB, BP 139/101 standing (pt reports mild dizziness), BP 133/78 after return to supine end of session; HR 100 bpm seated and 77 bpm resting      Pertinent Vitals/Pain Pain Assessment: 0-10 Pain Score: 10-Worst pain ever Faces Pain Scale: Hurts even more Pain Location: back Pain Descriptors / Indicators: Sore Pain Intervention(s): Monitored during session;Repositioned;Ice applied;Patient requesting pain meds-RN notified  See vitals in comments above  Home Living                      Prior Function            PT Goals (current goals can now be found in the care plan section) Acute Rehab PT Goals Patient Stated Goal: to get better, pain relief PT Goal Formulation: With patient Time For Goal Achievement: 05/19/20 Potential to Achieve Goals: Fair Progress towards PT goals: Progressing toward goals    Frequency    Min 4X/week      PT Plan Current plan remains appropriate    Co-evaluation              AM-PAC PT "6 Clicks" Mobility    Outcome Measure  Help needed turning from your back to your side while in a flat bed without using bedrails?: None Help needed moving from lying on your back to sitting on the side of a flat bed without using bedrails?: A Little Help needed moving to and from a bed to a chair (including a wheelchair)?: A Little Help needed standing up from a chair using your arms (e.g., wheelchair or bedside chair)?: A Little Help needed to walk in hospital room?: A Little Help needed climbing 3-5 steps with a railing? : A Lot 6 Click Score: 18    End of Session Equipment Utilized During Treatment: Gait belt;Back brace Activity Tolerance: Patient tolerated treatment well Patient left: in bed;with call bell/phone within reach;with bed alarm set;with family/visitor present Nurse Communication: Mobility status PT Visit Diagnosis: Other abnormalities of gait and mobility (R26.89);Pain Pain - part of body:  (midline back pain)     Time: 5427-0623 PT Time Calculation (min) (ACUTE ONLY): 38 min  Charges:  $Gait Training: 8-22 mins $Therapeutic Exercise: 8-22 mins                     Jilliana Burkes P., PTA Acute Rehabilitation Services Pager: [phone] Office: [phone]   Angus Palms 05/11/2020, 11:55 AM

## 2020-05-11 NOTE — Plan of Care (Signed)

## 2020-05-11 NOTE — Progress Notes (Signed)
ANTICOAGULATION CONSULT NOTE  Pharmacy Consult:  Coumadin Indication: atrial fibrillation  Allergies  Allergen Reactions  . Amlodipine Besylate     Other reaction(s): high dose make feet swell  . Codeine Nausea And Vomiting  . Meperidine Nausea Only  . Metoprolol Succinate [Metoprolol]     Other reaction(s): effects her breathing  . Oxycodone Nausea And Vomiting    Patient Measurements: Height: 5\' 3"  (160 cm) Weight: 76.9 kg (169 lb 8.5 oz) IBW/kg (Calculated) : 52.4  Vital Signs: Temp: 98.1 F (36.7 C) (01/04 0724) Temp Source: Oral (01/04 0724) BP: 127/66 (01/04 0724) Pulse Rate: 83 (01/04 0724)  Labs: Recent Labs    05/09/20 0151 05/10/20 0444 05/11/20 0323  HGB  --  12.9  --   HCT  --  37.4  --   PLT  --  190  --   LABPROT 16.5* 18.8* 21.2*  INR 1.4* 1.6* 1.9*    Estimated Creatinine Clearance: 45.9 mL/min (by C-G formula based on SCr of 0.7 mg/dL).   Assessment: 87 yo female admitted on 05/01/2020 with back pain found to have T11 compression fracture. Patient is s/p thoracic 10-11 arthrodesis with neurosurgery.  Patient was on Coumadin PTA for Afib, which was reversed for surgery and then resumed on 05/06/20.   INR approaching therapeutic goal; no bleeding reported.  Home Coumadin dose: alternating 1mg  with 1.5mg  every other night, INR therapeutic on admit   Goal of Therapy:  INR 2-3 Monitor platelets by anticoagulation protocol: Yes   Plan:  Coumadin 1.83m PO today Daily PT / INR  Arcenio Mullaly D. 4m, PharmD, BCPS, BCCCP 05/11/2020, 9:36 AM

## 2020-05-11 NOTE — Progress Notes (Signed)
Inpatient Rehab Admissions Coordinator:   SPoke to pts daughter, Gentry Fitz, over the phone who has concerns about short LOS on rehab.  She feels pt may do better with a slower paced, longer rehab program like SNF.  I will pass along to [facility] to f/u with her tomorrow.  Still awaiting final word from insurance in prior auth request for CIR.  My colleague, Ottie Glazier, will return tomorrow and f/u for CIR.    Estill Dooms, PT, DPT Admissions Coordinator [phone] 05/11/20  5:42 PM

## 2020-05-11 NOTE — Progress Notes (Signed)
PROGRESS NOTE    Danielle Thomas  [id number] DOB: May 27, 1927 DOA: 05/01/2020 PCP: Cari Caraway, MD  Brief Narrative:  HPI per Dr. Zada Finders on 05/01/20 Danielle Thomas is a 87 y.o. female with medical history significant for atrial fibrillation on Coumadin, hypertension, restless leg syndrome who presents to the ED for evaluation of uncontrolled back pain.  Patient was recently admitted from 03/27/2020-03/29/2020 for multiple electrolyte abnormalities.  She was also found to have a T11 compression fracture which was managed with pain medication and TLSO brace.  She was discharged to SNF with plan for outpatient follow-up.  Patient states she was in SNF for 2 weeks before returning home.  She says that since she has been in the hospital she has been essentially bedbound due to difficulty ambulating secondary to continued back pain.  She says previously she would ambulate with the use of a walker.  She currently lives alone.  She otherwise denies any focal deficits or new weakness in her extremities.  ED Course:  Initial vitals showed BP 180/92, pulse 83, RR 16, temp 97.8 F, SPO2 97% on room air.  Labs show sodium 134, potassium 2.9, bicarb 30, BUN 22, creatinine 0.90, serum glucose 130, WBC 11.3, hemoglobin 16.9, platelets 232,000, INR 2.8.  SARS-CoV-2 PCR panel is ordered and pending.  Right rib x-rays negative for displaced rib fracture or other acute abnormality of the right ribs.  No pneumothorax or pleural effusion seen.  MRI thoracic and lumbar spine without contrast showed progressed T11 compression fracture with loss of height increased from 20% to 40%.  New retropulsion of bone resulting in mild spinal stenosis with mild cord mass-effect but no spinal cord signal abnormality also noted.  Acute to subacute posterior left rib fractures 9 through 11 are seen.  No acute osseous abnormality in the lumbar spine seen.  Patient was given IV morphine 6 mg and 4 mg, IV  Dilaudid 0.5 mg, K 40 mEq orally.  EDP discussed with on-call neurosurgery, Dr. Christella Noa who recommended medical admission for further neurosurgery evaluation and potential kyphoplasty after the weekend.  The hospitalist service was consulted to admit for further evaluation management.  **Interim History  Patient underwent surgical intervention on 05/04/2020 by Dr. Christella Noa.  She is postoperative day 5 and still complains of some moderate back pain.  She states that her appetite is diminished.  Will need PT OT to evaluate for safe discharge disposition and they evaluated today and they are recommending CIR and 24-hour supervision and SNF if she is unable to go to CIR.  Will consult CIR for evaluation. Neurosurgery recommending continuing brace while ambulating and continuing pain control. After discussion with Neurosurgery Dr. Christella Noa recommended resuming Anticoagulation 48 hours after surgery and her Coumadin has been resumed but heer INR still remains subtherapeutic but slightly improving and has gone from 1.2 and is now 1.4. We will change Abx to po and stop after 7 days and she has completed course  Her pain was uncontrolled today we will give her a one-time dose of IV fentanyl and continue with p.o. hydrocodone-acetaminophen and schedule oxycodone.  We will also try Robaxin and she has not had a bowel movement in a few days now so we will adjust her bowel regimen and start her on scheduled MiraLAX as well as continuing scheduled senna docusate and giving her a bisacodyl suppository if necessary.  She is also complaining of some rectal burning so we will try some Anusol rectal cream.  She remained stable to  be discharged to CIR but family now wants to discuss maybe longer term options and does not know if she should go to CIR or go to a long-term SNF.  Assessment & Plan:   Principal Problem:   Compression fracture of T11 vertebra (HCC) Active Problems:   Atrial fibrillation (HCC)   Essential  hypertension, benign   Hypokalemia   Multiple fractures of ribs, left side, initial encounter for closed fracture   Closed wedge compression fracture of T11 vertebra (HCC)  Uncontrolled back pain due to progressed T11 compression fracture s/p thoracic 10-11 arthrodesis, allograft morselized, BMP, thoracic 9 thoracic 12 percutaneous pedicle screws postoperative day 7 -Continue TLSO brace, pain control.   -Patient has a known T11 compression fracture and following with orthopedics as an outpatient.   -MRI done here showed progression of the compression fracture, increased loss of height. It also showed new retropulsion of bone resulting in mild spinal stenosis with mild cord mass effect but no spinal cord signal abnormality  -Dr. Franky Macho, Neurosurgery was consulted and planning on percutaneous pedicle screws and a T10 to T12 arthrodesis today and she is POD6. -Consult  PT/OT after procedure and they are recommending CIR; CIR consulted for further evaluation and awaiting disposition -Added Robaxin and will continue with IV every 6 hours as needed. -Pain control with cyclobenzaprine 10 mg p.o. 3 times daily as needed muscle spasms, hydrocodone-acetaminophen 1-2 tabs p.o. every 4 hours as needed for moderate pain, methocarbamol 500 g IV every 6 hours as needed for muscle spasms as well, oxycodone 10 mg every 12 hours scheduled -Continue with bowel regimen -Patient continues to have moderate back pain but neurosurgery feels that she is doing well and they are recommending continuing the brace while she is ambulating; they are recommending her to go to CIR once bed is available.  Her pain was uncontrolled yesterday so she was given one-time dose of IV fentanyl 25 mcg  Atrial Fibrillation -On Coumadin as an outpatient.  INR 2.8 and trended up to 4.0 and is now 1.9 today .   -Held Coumadin for her surgical intervention.  On Coumadin for anticoagulation.   -Since INR was 4,neurosugery ordered oral Vitamin  K for surgery.  After vitamin K INR is trended down to 1.4 and PT is 16.5 -Anticoagulation resumed 48 hours after surgery and pharmacy is managing and adjusting dosing  Acute to subacute left 9-11 rib fractures: -Seen on MRI.  Continue pain control as above and supportive Care -Incentive spirometer. -She may have fallen at home -Continue to Monitor carefully   Hypertension -Continue losartan.  HCTZ was on hold due to hypokalemia but was resumed yesterday at 10 AM Been she is continuing hydrochlorothiazide 12.5 g daily and losartan 50 mg daily -Continue to monitor blood pressures per protocol -Last blood pressure reading was elevated at 127/66 -We will add hydralazine 10 mg IV every 6 for systolic blood pressure greater than 160 or diastolic blood pressure and 90  Hypokalemia -Improved and potassium is now 3.6 on last check -Magnesium level was 1.8 on last check -IVF is now discontinued  -Continue monitor and replete as necessary -Repeat CMP in a.m.  Hypophosphatemia -Patient's Phos Level was 3.0 on last check -Continue to Monitor and Trend and replete as necessary -Repeat CMP in the AM  Hyponatremia -Mild -Patient's sodium had dropped from 137 -> 133 -> 134 and is now back up to 135 on last time it was checked -continue IV fluid hydration as above -Continue to monitor and  trend and repeat CMP in a.m.  Restless Leg Syndrome -Continue Ropinirole 0.25 mg p.o. twice daily as needed for restless legs.  Glaucoma -Continue Dorzolamide 1 drop both eyes twice daily as well as latanoprost 1 drop in both eyes nightly  Leukocytosis in the setting of fall and E. coli UTI, poA and now improving  E Coli UTI, poA -She is Afebrile; WBC improved to 9.1 on last check on 05/11/2019 -U/A Showed turbid appearance with amber color urine, large hemoglobin, moderate leukocytes, negative nitrites, many bacteria, 0-5 non-squamous epithelial cells, greater than 50 RBCs per high-power field, 0-5  squamous epithelial cells, and greater than 50 WBCs  -Urine culture was obtained and was positive for E. 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  -No signs of bowel ischemia. -Need ambulatory referral to vascular surgery   Constipation -Has not had a bowel movement in few days so we will adjust her bowel regimen and likely in the setting of opioid usage -She has 5 mg of bisacodyl daily as needed for moderate constipation as well as 10 mg rectally daily as needed for moderate constipation and we have started her on scheduled bowel regimen with MiraLAX 17 g p.o. twice daily as well as senna docusate 1 tab p.o. twice daily  Rectal burning -Could be from straining and could be from hemorrhoids -We will start hydrocortisone 2.5% rectal cream twice daily  FTT -Patient report was previously independent prior to hospitalization in November -However she is not able to get out of bed due to pain currently -Nutritionist consulted and recommending Ensure Enlive po BID -Will need PT OT eval once pain improves and she is now being recommended for CIR  Obesity -Complicates overall prognosis and care -Estimated body mass index is 30.03 kg/m as calculated from the following:   Height as of this encounter: 5\' 3"  (1.6 m).   Weight as of this encounter: 76.9 kg. -Nutritonist consulted and recommending continuing Ensure Enlive po BID and MVI + Minerals Daily  DVT prophylaxis: SCDs; Will resume Anticoagulation today  Code Status: Partial  Family Communication: Discussed with daughter at the bedside Disposition Plan: PT and OT evaluation recommending CIR vs SNF and clearance by neurosurgery; CIR is evaluating and currently she remains  medically stable to be discharged when bed is available but family now does not know if she should just go to SNF given the need for long-term care.  Status is: Inpatient  Remains inpatient appropriate because:Unsafe d/c plan, IV treatments appropriate due to intensity of illness or inability to take PO and Inpatient level of care appropriate due to severity of illness   Dispo: The patient is from: Home              Anticipated d/c is to: CIR              Anticipated d/c date is: 1-2 days              Patient currently is medically stable to d/c.  Consultants:   Neurosurgery Dr. Christella Noa  CIR   Procedures:  PROCEDURE by Dr. Ashok Pall:  Procedure(s): thoracic 10-11 arthrodesis, allograft morsels, bmp Thoracic nine to Thoracic twelve percutaneous pedicle screws For segmental pedicle screw fixation  Antimicrobials:  Anti-infectives (From admission, onward)   Start     Dose/Rate Route Frequency Ordered Stop   05/06/20 2100  cephALEXin (KEFLEX) capsule 250 mg        250 mg Oral Every 6 hours 05/06/20 1350 05/09/20 1714   05/04/20 0615  ceFAZolin (ANCEF) IVPB 2g/100 mL premix        2 g 200 mL/hr over 30 Minutes Intravenous On call to O.R. 05/04/20 0517 05/04/20 1008   05/02/20 2200  cefTRIAXone (ROCEPHIN) 1 g in sodium chloride 0.9 % 100 mL IVPB  Status:  Discontinued        1 g 200 mL/hr over 30 Minutes Intravenous Every 24 hours 05/02/20 1919 05/06/20 1350   05/02/20 2015  cefTRIAXone (ROCEPHIN) 1 g in sodium chloride 0.9 % 100 mL IVPB  Status:  Discontinued        1 g 200 mL/hr over 30 Minutes Intravenous Every 24 hours 05/02/20 1918 05/02/20 1919        Subjective: Seen and examined at bedside and states that her pain was fairly well controlled today and she remained comfortable.  No nausea or vomiting.  I just ambulated the halls.  Denies any lightheadedness or dizziness.  No other concerns or complaints at this time.  Family at bedside wanting to discuss longer-term  placement and so will discuss with social work.  Objective: Vitals:   05/10/20 1948 05/11/20 0256 05/11/20 0500 05/11/20 0724  BP: 123/62 128/84  127/66  Pulse: 74 75  83  Resp: 18 16  16   Temp: 98.1 F (36.7 C) 98 F (36.7 C)  98.1 F (36.7 C)  TempSrc: Oral Oral  Oral  SpO2: 98% 97%  95%  Weight:   76.9 kg   Height:        Intake/Output Summary (Last 24 hours) at 05/11/2020 1057 Last data filed at 05/11/2020 0843 Gross per 24 hour  Intake 123 ml  Output 1 ml  Net 122 ml   Filed Weights   05/07/20 0634 05/09/20 0500 05/11/20 0500  Weight: 73.1 kg 75.5 kg 76.9 kg   Examination: Physical Exam:  Constitutional: WN/WD slightly obese Caucasian female who is elderly and in no acute distress.  Appears calm and comfortable today. Eyes: Lids and conjunctivae normal, sclerae anicteric  ENMT: External Ears, Nose appear normal. Grossly normal hearing. Neck: Appears normal, supple, no cervical masses, normal ROM, no appreciable thyromegaly; no JVD Respiratory: Diminished to auscultation bilaterally, no wheezing, rales, rhonchi or crackles. Normal respiratory effort and patient is not tachypenic. No accessory muscle use.  Unlabored breathing Cardiovascular: RRR, no murmurs / rubs / gallops. S1 and S2 auscultated.  Minimal extremity edema Abdomen: Soft, non-tender, distended secondary body habitus.  Bowel sounds positive.  GU: Deferred. Musculoskeletal: No clubbing / cyanosis of digits/nails. No joint deformity upper and lower extremities..  Skin: No rashes, lesions, ulcers on limited skin evaluation. No induration; Warm and dry.  Neurologic: CN 2-12 grossly intact with no focal deficits. Romberg sign and cerebellar reflexes not assessed.  Psychiatric: Normal judgment and insight. Alert and oriented x 3. Normal mood and appropriate affect.   Data Reviewed: I have personally reviewed following labs and imaging studies  CBC: Recent Labs  Lab 05/05/20 0212 05/06/20 0137 05/07/20 0301  05/08/20 0255 05/10/20 0444  WBC 14.5* 13.8* 11.4* 9.1 9.1  NEUTROABS 12.7* 11.5* 8.6* 6.9  --   HGB 14.5 13.0 12.6 12.3 12.9  HCT 42.3 38.3 37.3 38.3 37.4  MCV 89.6 89.5 89.2 91.6 88.4  PLT 216 155 200 193 [id number]   Basic Metabolic Panel: Recent Labs  Lab 05/05/20 0212 05/06/20 0137 05/07/20 0301 05/08/20 0255  NA 133* 134* 135 135  K 5.1 3.9 4.3 3.6  CL 99 99 100 101  CO2 23 25 27 26   GLUCOSE 137* 117* 112* 101*  BUN 19 20 20 16   CREATININE 0.87 0.83 0.75 0.70  CALCIUM 8.3* 8.2* 8.0* 7.8*  MG 1.7 1.7 2.0 1.8  PHOS 2.8 2.6 1.9* 3.0   GFR: Estimated Creatinine Clearance: 45.9 mL/min (by C-G formula based on SCr of 0.7 mg/dL). Liver Function Tests: Recent Labs  Lab 05/05/20 0212 05/06/20 0137 05/07/20 0301 05/08/20 0255  AST 41 32 23 19  ALT 30 26 21 20   ALKPHOS 67 59 53 44  BILITOT 1.1 1.0 1.1 0.8  PROT 5.2* 5.1* 5.1* 4.8*  ALBUMIN 2.8* 2.6* 2.4* 2.3*   No results for input(s): LIPASE, AMYLASE in the last 168 hours. No results for input(s): AMMONIA in the last 168 hours. Coagulation Profile: Recent Labs  Lab 05/07/20 0301 05/08/20 0255 05/09/20 0151 05/10/20 0444 05/11/20 0323  INR 1.2 1.2 1.4* 1.6* 1.9*   Cardiac Enzymes: No results for input(s): CKTOTAL, CKMB, CKMBINDEX, TROPONINI in the last 168 hours. BNP (last 3 results) No results for input(s): PROBNP in the last 8760 hours. HbA1C: No results for input(s): HGBA1C in the last 72 hours. CBG: No results for input(s): GLUCAP in the last 168 hours. Lipid Profile: No results for input(s): CHOL, HDL, LDLCALC, TRIG, CHOLHDL, LDLDIRECT in the last 72 hours. Thyroid Function Tests: No results for input(s): TSH, T4TOTAL, FREET4, T3FREE, THYROIDAB in the last 72 hours. Anemia Panel: No results for input(s): VITAMINB12, FOLATE, FERRITIN, TIBC, IRON, RETICCTPCT in the last 72 hours. Sepsis Labs: No results for input(s): PROCALCITON, LATICACIDVEN in the last 168 hours.  Recent Results (from the past 240  hour(s))  Resp Panel by RT-PCR (Flu A&B, Covid) Nasopharyngeal Swab     Status: None   Collection Time: 05/01/20  6:23 PM   Specimen: Nasopharyngeal Swab; Nasopharyngeal(NP) swabs in vial transport medium  Result Value Ref Range Status   SARS Coronavirus 2 by RT PCR NEGATIVE NEGATIVE Final    Comment: (NOTE) SARS-CoV-2 target nucleic acids are NOT DETECTED.  The SARS-CoV-2 RNA is generally detectable in upper respiratory specimens during the acute phase of infection. The lowest concentration of SARS-CoV-2 viral copies this assay can detect is 138 copies/mL. A negative result does not preclude SARS-Cov-2 infection and should not be used as the sole basis for treatment or other patient management decisions. A negative result may occur with  improper specimen collection/handling, submission of specimen other than nasopharyngeal swab, presence of viral mutation(s) within the areas targeted by this assay, and inadequate number of viral copies(<138 copies/mL). A negative result must be combined with clinical observations, patient history, and epidemiological information. The expected result is Negative.  Fact Sheet for Patients:  EntrepreneurPulse.com.au  Fact Sheet for Healthcare Providers:  IncredibleEmployment.be  This test is no t yet approved or cleared by the [place] FDA and  has been authorized for detection and/or diagnosis of SARS-CoV-2 by FDA under an Emergency Use Authorization (EUA). This EUA will remain  in effect (meaning this test can be used) for the duration of the COVID-19 declaration under Section 564(b)(1) of the Act, 21 U.S.C.section 360bbb-3(b)(1), unless the authorization is terminated  or revoked sooner.       Influenza A by PCR NEGATIVE NEGATIVE Final   Influenza B  by PCR NEGATIVE NEGATIVE Final    Comment: (NOTE) The Xpert Xpress SARS-CoV-2/FLU/RSV plus assay is intended as an aid in the diagnosis of influenza from  Nasopharyngeal swab specimens and should not be used as a sole basis for treatment. Nasal washings and aspirates are unacceptable for Xpert Xpress SARS-CoV-2/FLU/RSV testing.  Fact Sheet for Patients: BloggerCourse.comhttps://www.fda.gov/media/152166/download  Fact Sheet for Healthcare Providers: SeriousBroker.ithttps://www.fda.gov/media/152162/download  This test is not yet approved or cleared by the [place] FDA and has been authorized for detection and/or diagnosis of SARS-CoV-2 by FDA under an Emergency Use Authorization (EUA). This EUA will remain in effect (meaning this test can be used) for the duration of the COVID-19 declaration under Section 564(b)(1) of the Act, 21 U.S.C. section 360bbb-3(b)(1), unless the authorization is terminated or revoked.  Performed at [facility], [address] [place], [place] [address]   Culture, Urine     Status: Abnormal   Collection Time: 05/02/20  7:18 PM   Specimen: Urine, Catheterized  Result Value Ref Range Status   Specimen Description   Final    URINE, CATHETERIZED Performed at [facility], [address] [place], [place] [address]    Special Requests   Final    NONE Performed at [facility], [address] [place], [place] [address]    Culture >=100,000 COLONIES/mL ESCHERICHIA COLI (A)  Final   Report Status 05/04/2020 FINAL  Final   Organism ID, Bacteria ESCHERICHIA COLI (A)  Final      Susceptibility   Escherichia coli - MIC*    AMPICILLIN <=2 SENSITIVE Sensitive     CEFAZOLIN <=4 SENSITIVE Sensitive     CEFEPIME <=0.12 SENSITIVE Sensitive     CEFTRIAXONE <=0.25 SENSITIVE Sensitive     CIPROFLOXACIN <=0.25 SENSITIVE Sensitive     GENTAMICIN <=1 SENSITIVE Sensitive     IMIPENEM <=0.25 SENSITIVE Sensitive     NITROFURANTOIN <=16 SENSITIVE Sensitive     TRIMETH/SULFA <=20 SENSITIVE Sensitive     AMPICILLIN/SULBACTAM <=2 SENSITIVE Sensitive     PIP/TAZO <=4 SENSITIVE Sensitive     *  >=100,000 COLONIES/mL ESCHERICHIA COLI  Surgical pcr screen     Status: Abnormal   Collection Time: 05/04/20  5:37 AM   Specimen: Nasal Mucosa; Nasal Swab  Result Value Ref Range Status   MRSA, PCR NEGATIVE NEGATIVE Final   Staphylococcus aureus POSITIVE (A) NEGATIVE Final    Comment: (NOTE) The Xpert SA Assay (FDA approved for NASAL specimens in patients 85 years of age and older), is one component of a comprehensive surveillance program. It is not intended to diagnose infection nor to guide or monitor treatment. Performed at [facility], [address] [place], [place] [address]      RN Pressure Injury Documentation:     Estimated body mass index is 30.03 kg/m as calculated from the following:   Height as of this encounter: 5\' 3"  (1.6 m).   Weight as of this encounter: 76.9 kg.  Malnutrition Type:  Nutrition Problem: Inadequate oral intake Etiology: poor appetite   Malnutrition Characteristics:  Signs/Symptoms: per patient/family report   Nutrition Interventions:  Interventions: Ensure Enlive (each supplement provides 350kcal and 20 grams of protein)     Radiology Studies: No results found. Scheduled Meds: . dorzolamide  1 drop Both Eyes BID  . feeding supplement  237 mL Oral BID BM  . hydrocortisone   Rectal BID  . latanoprost  1 drop Both Eyes QHS  . losartan  50  mg Oral Daily  . multivitamin with minerals  1 tablet Oral Daily  . oxyCODONE  10 mg Oral Q12H  . polyethylene glycol  17 g Oral BID  . senna-docusate  1 tablet Oral BID  . sodium chloride flush  3 mL Intravenous Q12H  . warfarin  1.5 mg Oral ONCE-1600  . Warfarin - Pharmacist Dosing Inpatient   Does not apply q1600   Continuous Infusions: . sodium chloride Stopped (05/02/20 2103)  . sodium chloride      LOS: 10 days   Kerney Elbe, DO [facility] PAGER is on [place]  If 7PM-7AM, please contact night-coverage [email or web address]

## 2020-05-11 NOTE — Progress Notes (Signed)
Physical Therapy Treatment Patient Details Name: Danielle Thomas MRN: [id number] DOB: 1927/12/12 Today's Date: 05/11/2020    History of Present Illness Pt is a 87 y.o. female with PMH significant for a-fib on Coumadin, HTN, restless leg syndrome. Patient was recently admitted from 03/27/2020-03/29/2020 for multiple electrolyte abnormalities. She was also found to have a T11 compression fracture which was managed with pain medication and TLSO brace. She was discharged to SNF with plan for outpatient follow-up, however returned to the ED with uncontrolled back pain. This patient underwent T10-T11 fusion on 05/04/2020 per Dr. Franky Macho.    PT Comments    Pt with improved ambulation tolerance this date however continues to be non-compliant with back precautions, has difficulty donning LSO brace, and is slightly impulsive. Pt to continue to benefit from CIR upon d/c to achieve safe mod I level of function for safe transition home.   Follow Up Recommendations  CIR     Equipment Recommendations  Rolling walker with 5" wheels;3in1 (PT)    Recommendations for Other Services Rehab consult     Precautions / Restrictions Precautions Precautions: Fall;Back Precaution Booklet Issued: Yes (comment) (unable to recall) Precaution Comments: LSO Required Braces or Orthoses: Spinal Brace Spinal Brace: Lumbar corset;Applied in sitting position Restrictions Weight Bearing Restrictions: No    Mobility  Bed Mobility Overal bed mobility: Needs Assistance Bed Mobility: Rolling;Sidelying to Sit;Sit to Sidelying Rolling: Supervision Sidelying to sit: Min guard     Sit to sidelying: Min guard General bed mobility comments: pt impulsive, min guard for safety, verbal cues to not reach behind self when return to supine, educated on log roll technique to adhere to back precautions  Transfers Overall transfer level: Needs assistance Equipment used: Rolling walker (2 wheeled) Transfers: Sit to/from  Stand Sit to Stand: Min guard         General transfer comment: verbal cues for safe hand placement ie. push up from bed and reach back for bed, despite max verbal cues pt continues to pull up on walker and hold onto walker when returning to sitting  Ambulation/Gait Ambulation/Gait assistance: Min guard;Min assist Gait Distance (Feet): 120 Feet Assistive device: Rolling walker (2 wheeled) Gait Pattern/deviations: Step-through pattern;Decreased stride length Gait velocity: dec Gait velocity interpretation: <1.31 ft/sec, indicative of household ambulator General Gait Details: minA for walker management around obstacles, mild SOB states its due to the mask, minimal pain   Stairs             Wheelchair Mobility    Modified Rankin (Stroke Patients Only)       Balance Overall balance assessment: Needs assistance Sitting-balance support: Feet supported Sitting balance-Leahy Scale: Fair Sitting balance - Comments: pt able to attempt to don brace, no LOB but required assist to don properly   Standing balance support: Bilateral upper extremity supported Standing balance-Leahy Scale: Poor Standing balance comment: dependent on RW                            Cognition Arousal/Alertness: Awake/alert Behavior During Therapy: Impulsive Overall Cognitive Status: Impaired/Different from baseline Area of Impairment: Safety/judgement;Memory                     Memory: Decreased recall of precautions (unable to recall back precautions) Following Commands: Follows one step commands with increased time;Follows one step commands consistently (pt extremely HOH) Safety/Judgement: Decreased awareness of safety Awareness: Emergent Problem Solving: Requires verbal cues;Requires tactile cues General Comments: pt impulsive  and quick to move, doesn't adhere to back precautions and is unable to don brace without assist      Exercises      General Comments General  comments (skin integrity, edema, etc.): VSS, spoke with daughter re: D/c plan      Pertinent Vitals/Pain Pain Assessment: Faces Faces Pain Scale: Hurts a little bit Pain Location: back Pain Descriptors / Indicators: Sore Pain Intervention(s): Monitored during session    Home Living                      Prior Function            PT Goals (current goals can now be found in the care plan section) Acute Rehab PT Goals PT Goal Formulation: With patient Time For Goal Achievement: 05/19/20 Potential to Achieve Goals: Fair Progress towards PT goals: Progressing toward goals    Frequency    Min 4X/week      PT Plan Current plan remains appropriate    Co-evaluation              AM-PAC PT "6 Clicks" Mobility   Outcome Measure  Help needed turning from your back to your side while in a flat bed without using bedrails?: None Help needed moving from lying on your back to sitting on the side of a flat bed without using bedrails?: A Little Help needed moving to and from a bed to a chair (including a wheelchair)?: A Little Help needed standing up from a chair using your arms (e.g., wheelchair or bedside chair)?: A Little Help needed to walk in hospital room?: A Little Help needed climbing 3-5 steps with a railing? : A Lot 6 Click Score: 18    End of Session Equipment Utilized During Treatment: Gait belt;Back brace Activity Tolerance: Patient tolerated treatment well Patient left: in bed;with call bell/phone within reach;with bed alarm set;with family/visitor present Nurse Communication: Mobility status PT Visit Diagnosis: Other abnormalities of gait and mobility (R26.89);Pain     Time: [id number] PT Time Calculation (min) (ACUTE ONLY): 19 min  Charges:  $Gait Training: 8-22 mins                     Kittie Plater, PT, DPT Acute Rehabilitation Services Pager #: [phone] Office #: [phone]    Berline Lopes 05/11/2020, 10:31 AM

## 2020-05-11 NOTE — Progress Notes (Signed)
Patient ID: Danielle Thomas, female   DOB: 03-17-1928, 87 y.o.   MRN: [id number] Currently sleeping Dressing is dry, wound is clean, no signs of infection Awaiting placement. She continues to report a great deal of pain, but is able to rest.  Working better with PT, and OT Recommend CIR Would be hesitant to increase pain medication.

## 2020-05-12 DIAGNOSIS — I951 Orthostatic hypotension: Secondary | ICD-10-CM | POA: Diagnosis not present

## 2020-05-12 LAB — CBC WITH DIFFERENTIAL/PLATELET
Abs Immature Granulocytes: 0.08 10*3/uL — ABNORMAL HIGH (ref 0.00–0.07)
Basophils Absolute: 0 10*3/uL (ref 0.0–0.1)
Basophils Relative: 0 %
Eosinophils Absolute: 0.3 10*3/uL (ref 0.0–0.5)
Eosinophils Relative: 4 %
HCT: 35.3 % — ABNORMAL LOW (ref 36.0–46.0)
Hemoglobin: 11.4 g/dL — ABNORMAL LOW (ref 12.0–15.0)
Immature Granulocytes: 1 %
Lymphocytes Relative: 9 %
Lymphs Abs: 0.8 10*3/uL (ref 0.7–4.0)
MCH: 29.8 pg (ref 26.0–34.0)
MCHC: 32.3 g/dL (ref 30.0–36.0)
MCV: 92.2 fL (ref 80.0–100.0)
Monocytes Absolute: 0.8 10*3/uL (ref 0.1–1.0)
Monocytes Relative: 10 %
Neutro Abs: 6.6 10*3/uL (ref 1.7–7.7)
Neutrophils Relative %: 76 %
Platelets: 217 10*3/uL (ref 150–400)
RBC: 3.83 MIL/uL — ABNORMAL LOW (ref 3.87–5.11)
RDW: 16.1 % — ABNORMAL HIGH (ref 11.5–15.5)
WBC: 8.6 10*3/uL (ref 4.0–10.5)
nRBC: 0 % (ref 0.0–0.2)

## 2020-05-12 LAB — PROTIME-INR
INR: 2.5 — ABNORMAL HIGH (ref 0.8–1.2)
Prothrombin Time: 25.9 seconds — ABNORMAL HIGH (ref 11.4–15.2)

## 2020-05-12 LAB — COMPREHENSIVE METABOLIC PANEL
ALT: 26 U/L (ref 0–44)
AST: 23 U/L (ref 15–41)
Albumin: 2.3 g/dL — ABNORMAL LOW (ref 3.5–5.0)
Alkaline Phosphatase: 52 U/L (ref 38–126)
Anion gap: 9 (ref 5–15)
BUN: 17 mg/dL (ref 8–23)
CO2: 24 mmol/L (ref 22–32)
Calcium: 8 mg/dL — ABNORMAL LOW (ref 8.9–10.3)
Chloride: 101 mmol/L (ref 98–111)
Creatinine, Ser: 0.82 mg/dL (ref 0.44–1.00)
GFR, Estimated: >60 mL/min (ref >60)
Glucose, Bld: 109 mg/dL — ABNORMAL HIGH (ref 70–99)
Potassium: 4.1 mmol/L (ref 3.5–5.1)
Sodium: 134 mmol/L — ABNORMAL LOW (ref 135–145)
Total Bilirubin: 0.9 mg/dL (ref 0.3–1.2)
Total Protein: 4.6 g/dL — ABNORMAL LOW (ref 6.5–8.1)

## 2020-05-12 LAB — PHOSPHORUS: Phosphorus: 3.5 mg/dL (ref 2.5–4.6)

## 2020-05-12 LAB — MAGNESIUM: Magnesium: 1.9 mg/dL (ref 1.7–2.4)

## 2020-05-12 MED ORDER — SODIUM CHLORIDE 0.9 % IV SOLN: INTRAVENOUS | Status: AC

## 2020-05-12 MED ORDER — WARFARIN 0.5 MG HALF TABLET
0.5000 mg | ORAL_TABLET | Freq: Once | ORAL | Status: AC
  Administered 2020-05-12: 0.5 mg via ORAL
  Filled 2020-05-12: qty 1

## 2020-05-12 MED ORDER — ACETAMINOPHEN 500 MG PO TABS
1000.0000 mg | ORAL_TABLET | Freq: Three times a day (TID) | ORAL | Status: DC
  Administered 2020-05-12 – 2020-05-13 (×2): 1000 mg via ORAL
  Filled 2020-05-12 (×2): qty 2

## 2020-05-12 NOTE — Progress Notes (Addendum)
Physical Therapy Treatment Patient Details Name: Danielle Thomas MRN: [id number] DOB: 1928/03/12 Today's Date: 05/12/2020    History of Present Illness Pt is a 86 y.o. female with PMH significant for a-fib on Coumadin, HTN, restless leg syndrome. Patient was recently admitted from 03/27/2020-03/29/2020 for multiple electrolyte abnormalities. She was also found to have a T11 compression fracture which was managed with pain medication and TLSO brace. She was discharged to SNF with plan for outpatient follow-up, however returned to the ED with uncontrolled back pain. This patient underwent T10-T11 fusion on 05/04/2020 per Dr. Christella Noa.    PT Comments    Pt sidelying on staff arrival to room, agreeable to therapy session with encouragement but anxious and with inconsistent report of pain. Pt performed supine<>EOB transfer with up to minA, needing cues for compliance with back precautions. Pt BP checked supine/seated/standing however automatic BP readings unreliable (see below) as pt shaking once in stance and difficulty keeping RUE still -RN/MD notified pt had s/sx orthostatic hypotension (dyspnea, shaking/feeling weak) upon standing and will need manual BP orthostatic assessment. Deferred gait due to pt shakiness/symptoms in stance. Once pt returned to supine, reviewed supine BLE HEP handout with pt (link: [email or web address] Access Code: [email or web address]) and encouraged pt to perform TID as able. Pt able to recall 2/3 back precautions but needs consistent cues for compliance with precautions during mobility and exercise as she tends to be restless in bed and at times impulsively flexes hips past 90 deg or attempts to twist LB. Pt will continue to benefit from skilled rehab in a post-acute setting to maximize functional gains before returning home.   Follow Up Recommendations  CIR     Equipment Recommendations  Rolling walker with 5" wheels;3in1 (PT)    Recommendations for Other Services Rehab  consult     Precautions / Restrictions Precautions Precautions: Fall;Back Precaution Booklet Issued: Yes (comment) (unable to recall) Precaution Comments: LSO Required Braces or Orthoses: Spinal Brace Spinal Brace: Lumbar corset;Applied in sitting position Restrictions Weight Bearing Restrictions: No    Mobility  Bed Mobility Overal bed mobility: Needs Assistance Bed Mobility: Rolling;Sidelying to Sit;Sit to Sidelying Rolling: Supervision Sidelying to sit: Min assist     Sit to sidelying: Min assist General bed mobility comments: pt impulsive, min guard to minA for safety, verbal cues to not reach behind self when return to supine, educated on log roll technique to adhere to back precautions  Transfers Overall transfer level: Needs assistance Equipment used: Rolling walker (2 wheeled) Transfers: Sit to/from Stand Sit to Stand: Min guard         General transfer comment: verbal cues for safe hand with poor carryover of cues  Ambulation/Gait             General Gait Details: deferred gait progression as pt symptomatic in standing at RW and SBP dropped >20 points   Stairs             Wheelchair Mobility    Modified Rankin (Stroke Patients Only)       Balance Overall balance assessment: Needs assistance Sitting-balance support: Feet supported Sitting balance-Leahy Scale: Fair Sitting balance - Comments: pt able to attempt to don brace, no LOB but required assist to don properly   Standing balance support: Bilateral upper extremity supported Standing balance-Leahy Scale: Poor Standing balance comment: dependent on RW and external assist due to symptoms of bodily shaking in stance and "not feeling right"  Cognition Arousal/Alertness: Awake/alert Behavior During Therapy: Impulsive;Anxious Overall Cognitive Status: Impaired/Different from baseline Area of Impairment: Safety/judgement;Memory                      Memory: Decreased recall of precautions (unable to recall back precautions) Following Commands: Follows one step commands with increased time (pt extremely HOH) Safety/Judgement: Decreased awareness of safety Awareness: Emergent Problem Solving: Requires verbal cues;Requires tactile cues;Difficulty sequencing General Comments: pt impulsive and quick to move, needs cues for back precautions and is unable to don brace without assist; pt significantly anxious and possible near-syncopal episode when standing at bedside      Exercises General Exercises - Lower Extremity Ankle Circles/Pumps: AROM;Strengthening;Both;10 reps;Seated Quad Sets: AROM;Strengthening;Both;5 reps;Supine Gluteal Sets: AROM;Strengthening;Both;5 reps;Supine Heel Slides: AROM;Strengthening;Both;10 reps;Supine    General Comments General comments (skin integrity, edema, etc.): BP 123/62 supine; BP 137/92 seated; BP 101/11 standing (may be inaccurate, pt arms were shaking in stance); BP 138/73 return to supine; HR 100-128 bpm in stance and SpO2 95% on RA      Pertinent Vitals/Pain Pain Assessment: 0-10 Pain Score: 5  Faces Pain Scale: Hurts even more Pain Location: back Pain Descriptors / Indicators: Sore Pain Intervention(s): Monitored during session;Repositioned    Home Living                      Prior Function            PT Goals (current goals can now be found in the care plan section) Acute Rehab PT Goals Patient Stated Goal: to get better, pain relief PT Goal Formulation: With patient Time For Goal Achievement: 05/19/20 Potential to Achieve Goals: Fair Progress towards PT goals: Progressing toward goals (limited progress this date 2/2 orthostatic hypotension sxs)    Frequency    Min 4X/week      PT Plan Current plan remains appropriate    Co-evaluation              AM-PAC PT "6 Clicks" Mobility   Outcome Measure  Help needed turning from your back to your side while in  a flat bed without using bedrails?: None Help needed moving from lying on your back to sitting on the side of a flat bed without using bedrails?: A Little Help needed moving to and from a bed to a chair (including a wheelchair)?: A Little Help needed standing up from a chair using your arms (e.g., wheelchair or bedside chair)?: A Little Help needed to walk in hospital room?: A Little Help needed climbing 3-5 steps with a railing? : A Lot 6 Click Score: 18    End of Session Equipment Utilized During Treatment: Gait belt;Back brace Activity Tolerance: Patient tolerated treatment well Patient left: in bed;with call bell/phone within reach;with bed alarm set;with family/visitor present Nurse Communication: Mobility status PT Visit Diagnosis: Other abnormalities of gait and mobility (R26.89);Pain Pain - part of body:  (midline back pain)     Time: 8032-1224 PT Time Calculation (min) (ACUTE ONLY): 16 min  Charges:  $Therapeutic Activity: 8-22 mins                     Detria Cummings P., PTA Acute Rehabilitation Services Pager: [phone] Office: [phone]   Angus Palms 05/12/2020, 10:41 AM

## 2020-05-12 NOTE — Progress Notes (Addendum)
Nutrition Follow-up  DOCUMENTATION CODES:   Not applicable  INTERVENTION:   -Continue Ensure Enlive po BID, each supplement provides 350 kcal and 20 grams of protein -MVI with minerals daily  NUTRITION DIAGNOSIS:   Inadequate oral intake related to poor appetite as evidenced by per patient/family report.  Ongoing  GOAL:   Patient will meet greater than or equal to 90% of their needs  Progressing   MONITOR:   PO intake,Supplement acceptance,Labs,Weight trends,I & O's  REASON FOR ASSESSMENT:   Malnutrition Screening Tool    ASSESSMENT:   86 year old with history of A. fib on Coumadin, hypertension, restless leg syndrome who presents to the hospital for evaluation of uncontrolled back pain.  She has a known compression fracture on T11 which was diagnosed in November 2021 and was managed with pain medications, TLSO brace and was following with neurosurgery as an outpatient.  She was recently returned to home from a skilled nursing facility and after that she has been having severe back pain, essentially bedbound, unable to ambulate.  12/28- s/p Procedure(s): thoracic 10-11 arthrodesis, allograft morsels, bmp Thoracic nine to Thoracic twelve percutaneous pedicle screws For segmental pedicle screw fixation  Reviewed I/O's: +243 ml x 24 hours and +2.2 L since admission  Pt sleeping soundly at time of visit. She did not respond to voice or touch. She remains with good appetite. Noted meal completion 50-80%. Noted completed Ensure supplement on tray table.   Medications reviewed and include miralax and senokot.   Per chart review, pt awaiting CIR vs SNF placement.   Labs reviewed: Na: 134.   NUTRITION - FOCUSED PHYSICAL EXAM:  Flowsheet Row Most Recent Value  Orbital Region No depletion  Upper Arm Region Moderate depletion  Thoracic and Lumbar Region No depletion  Buccal Region No depletion  Temple Region No depletion  Clavicle Bone Region No depletion  Clavicle and  Acromion Bone Region No depletion  Scapular Bone Region No depletion  Dorsal Hand Mild depletion  Patellar Region Moderate depletion  Anterior Thigh Region Moderate depletion  Posterior Calf Region Moderate depletion  Edema (RD Assessment) None  Hair Reviewed  Eyes Reviewed  Mouth Reviewed  Skin Reviewed  Nails Reviewed       Diet Order:   Diet Order            Diet regular Room service appropriate? Yes; Fluid consistency: Thin  Diet effective now                 EDUCATION NEEDS:   No education needs have been identified at this time  Skin:  Skin Assessment: Skin Integrity Issues: Skin Integrity Issues:: Incisions Incisions: closed back  Last BM:  05/11/19  Height:   Ht Readings from Last 1 Encounters:  05/01/20 5\' 3"  (1.6 m)    Weight:   Wt Readings from Last 1 Encounters:  05/11/20 76.9 kg   BMI:  Body mass index is 30.03 kg/m.  Estimated Nutritional Needs:   Kcal:  1650-1850  Protein:  85-100 grams  Fluid:  > 1.6 L    07/09/20, RD, LDN, CDCES Registered Dietitian II Certified Diabetes Care and Education Specialist Please refer to [facility] for RD and/or RD on-call/weekend/after hours pager

## 2020-05-12 NOTE — Progress Notes (Addendum)
Inpatient Rehabilitation Admissions Coordinator  I have notified Dr. Waymon Amato and acute team that [facility] is offering a peer to peer by 12 noon today. I await the final determination .  Ottie Glazier, RN, MSN Rehab Admissions Coordinator [phone] 05/12/2020 8:35 AM   I spoke with daughter Lanora Manis by phone and she requests peer to peer to be complete. Family has yet to make final decision on CIR vs SNF. Call [phone] ext [phone] to schedule by 12 noon today. Acute team and Dr. Waymon Amato made aware.  Ottie Glazier, RN, MSN Rehab Admissions Coordinator [phone] 05/12/2020 8:44 AM

## 2020-05-12 NOTE — Progress Notes (Signed)
ANTICOAGULATION CONSULT NOTE  Pharmacy Consult:  Coumadin Indication: atrial fibrillation  Allergies  Allergen Reactions  . Amlodipine Besylate     Other reaction(s): high dose make feet swell  . Codeine Nausea And Vomiting  . Meperidine Nausea Only  . Metoprolol Succinate [Metoprolol]     Other reaction(s): effects her breathing  . Oxycodone Nausea And Vomiting    Patient Measurements: Height: 5\' 3"  (160 cm) Weight: 76.9 kg (169 lb 8.5 oz) IBW/kg (Calculated) : 52.4  Vital Signs: Temp: 98.7 F (37.1 C) (01/05 0556) Temp Source: Oral (01/05 0556) BP: 146/87 (01/05 0556) Pulse Rate: 83 (01/05 0556)  Labs: Recent Labs    05/10/20 0444 05/11/20 0323 05/12/20 0208  HGB 12.9  --  11.4*  HCT 37.4  --  35.3*  PLT 190  --  217  LABPROT 18.8* 21.2* 25.9*  INR 1.6* 1.9* 2.5*  CREATININE  --   --  0.82    Estimated Creatinine Clearance: 44.8 mL/min (by C-G formula based on SCr of 0.82 mg/dL).   Assessment: 87 yo female admitted on 05/01/2020 with back pain found to have T11 compression fracture. Patient is s/p thoracic 10-11 arthrodesis with neurosurgery.  Patient was on Coumadin PTA for Afib, which was reversed for surgery and then resumed on 05/06/20.   INR increased significantly to therapeutic level; no bleeding reported.  Home Coumadin dose: alternating 1mg  with 1.5mg  every other night, INR therapeutic on admit   Goal of Therapy:  INR 2-3 Monitor platelets by anticoagulation protocol: Yes   Plan:  Coumadin 0.58m PO today Daily PT / INR  Tyah Acord D. 4m, PharmD, BCPS, BCCCP 05/12/2020, 8:17 AM

## 2020-05-12 NOTE — Progress Notes (Signed)
Inpatient Rehabilitation Admissions Coordinator   I have received approval for Cir admit today, but no bed available. I contacted her daughter, Lanora Manis, by phone and she is aware. She will discuss with her siblings. I explained that if CIR bed not available this week, other rehab venues would have to be explored. I will follow up tomorrow.  Ottie Glazier, RN, MSN Rehab Admissions Coordinator [phone] 05/12/2020 1:34 PM

## 2020-05-12 NOTE — Progress Notes (Signed)
PROGRESS NOTE   Danielle Thomas  [id number]    DOB: August 16, 1927    DOA: 05/01/2020  PCP: Cari Caraway, MD   I have briefly reviewed patients previous medical records in [facility].  Chief Complaint  Patient presents with  . Back Pain    Brief Narrative:  87 year old female with past medical history significant for atrial fibrillation on Coumadin, hypertension, restless leg syndrome, recently admitted 03/27/2020-03/29/2020 for multiple electrolyte abnormalities been also found to have T11 compression fracture which was managed conservatively with pain medications, TLSO brace and she was discharged to SNF with plans for outpatient follow-up.  She reportedly stayed at [facility] for 2 weeks prior to returning home but since then has essentially been bedbound due to difficulty ambulating secondary to continued back pain.  Prior to that she ambulated with the help of a walker.  She lives alone.  MRI thoracic and lumbar spine without contrast showed progressed T11 compression fracture with loss of height increased from 20 to 40%, new retropulsion of bone resulting in mild spinal stenosis with mild cord mass-effect but no spinal cord signal abnormality noted.  Also noted acute to subacute posterior left rib fractures 9 through 11.  Neurosurgery was consulted.  S/p surgical intervention by Dr. Christella Noa on 05/04/2020.  Insurance had denied CIR, I was able to overturn this on peer to peer review with Psychologist, occupational on 1/5.  Family however is undecided on whether patient will go to CIR versus SNF.   Assessment & Plan:  Principal Problem:   Compression fracture of T11 vertebra (HCC) Active Problems:   Atrial fibrillation (HCC)   Essential hypertension, benign   Hypokalemia   Multiple fractures of ribs, left side, initial encounter for closed fracture   Closed wedge compression fracture of T11 vertebra (HCC)   Intractable back pain due to progressed T11 compression fracture Neurosurgery  consulted.  S/p thoracic 10-11 arthrodesis, thoracic 9-12 percutaneous pedicle screws on 12/8.  Patient with ongoing pain issues.  Judicious use of opioids.  Will add scheduled Tylenol.  As per RN, patient noncompliant with consistent use of brace, encouraged.  Awaiting family decision regarding CIR versus SNF.  Constipation Aggressive bowel regimen.  Rectal burning likely related to this but has not complained of it today.  Orthostatic hypotension Noted by PT on 1/5 although not reproducible when checked later in the morning.  Ongoing poor oral intake.  Will provide brief IV fluids and reassess in a.m.  Encouraged oral fluid intake.  Atrial fibrillation Coumadin held for surgery.  Also received vitamin K to reverse INR for surgery.  Postop Coumadin resumed 48 hours after discussing with surgery.  INR 2.5.  Coumadin per pharmacy.  Rate controlled.  Acute to subacute left 9-11 rib fractures Supportive treatment including pain management and incentive spirometry and mobilization.  Essential hypertension Soft blood pressures.  Remains on losartan.  HCTZ on hold.  Hypokalemia Replaced.  Magnesium 1.9.  Hypophosphatemia Resolved.  Hyponatremia Improved or almost resolved.  Restless leg syndrome Continue Requip.  Glaucoma Continue home meds.  Leukocytosis Resolved.  E. coli UTI, POA Completed course of antibiotics initially with IV ceftriaxone followed by Keflex in.  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  Failure to thrive Multifactorial due to very advanced age and multiple severe significant acute on chronic illnesses as noted above.  May consider palliative care consultation as outpatient.  Body mass  index is 30.03 kg/m.  Nutritional Status Nutrition Problem: Inadequate oral intake Etiology: poor appetite Signs/Symptoms: per patient/family  report Interventions: Ensure Enlive (each supplement provides 350kcal and 20 grams of protein)  Pressure Ulcer:     DVT prophylaxis: SCD's Start: 05/04/20 1430 Place and maintain sequential compression device Start: 05/01/20 2152     Code Status: Partial Code Family Communication:  Disposition:  Status is: Inpatient  Remains inpatient appropriate because:Inpatient level of care appropriate due to severity of illness   Dispo: The patient is from: Home              Anticipated d/c is to: SNF versus CIR              Anticipated d/c date is: 1 day              Patient currently is not medically stable to d/c.        Consultants:   Neurosurgery  Procedures:   As above  Antimicrobials:    Anti-infectives (From admission, onward)   Start     Dose/Rate Route Frequency Ordered Stop   05/06/20 2100  cephALEXin (KEFLEX) capsule 250 mg        250 mg Oral Every 6 hours 05/06/20 1350 05/09/20 1714   05/04/20 0615  ceFAZolin (ANCEF) IVPB 2g/100 mL premix        2 g 200 mL/hr over 30 Minutes Intravenous On call to O.R. 05/04/20 0517 05/04/20 1008   05/02/20 2200  cefTRIAXone (ROCEPHIN) 1 g in sodium chloride 0.9 % 100 mL IVPB  Status:  Discontinued        1 g 200 mL/hr over 30 Minutes Intravenous Every 24 hours 05/02/20 1919 05/06/20 1350   05/02/20 2015  cefTRIAXone (ROCEPHIN) 1 g in sodium chloride 0.9 % 100 mL IVPB  Status:  Discontinued        1 g 200 mL/hr over 30 Minutes Intravenous Every 24 hours 05/02/20 1918 05/02/20 1919        Subjective:  Patient reports ongoing back pain, uncontrolled by current pain regimen.  No BM for 2 to 3 days.  Per nursing, poor appetite.  As per PT this morning, when patient stood up, shaky, nauseous and some dyspnea-improved after laying back in bed.  Objective:   Vitals:   05/12/20 1132 05/12/20 1136 05/12/20 1140 05/12/20 1507  BP: 100/62 110/60 (!) 100/58 119/68  Pulse:    85  Resp:    16  Temp:    98.3 F (36.8 C)  TempSrc:     Oral  SpO2:    94%  Weight:      Height:        General exam: Elderly female, moderately built and frail, lying uncomfortably supine in bed. Respiratory system: Clear to auscultation. Respiratory effort normal. Cardiovascular system: S1 & S2 heard, RRR. No JVD, murmurs, rubs, gallops or clicks. No pedal edema.  Not on telemetry Gastrointestinal system: Abdomen is nondistended, soft and nontender. No organomegaly or masses felt. Normal bowel sounds heard. Central nervous system: Alert and oriented x2. No focal neurological deficits. Extremities: Symmetric 5 x 5 power. Skin: Extensive bruising of her back, mostly across lower mid back and left upper back but also extends to the left upper midline Psychiatry: Judgement and insight appear somewhat impaired. Mood & affect appropriate.     Data Reviewed:   I have personally reviewed following labs and imaging studies   CBC: Recent Labs  Lab 05/07/20 0301 05/08/20 0255 05/10/20 0444  05/12/20 0208  WBC 11.4* 9.1 9.1 8.6  NEUTROABS 8.6* 6.9  --  6.6  HGB 12.6 12.3 12.9 11.4*  HCT 37.3 38.3 37.4 35.3*  MCV 89.2 91.6 88.4 92.2  PLT 200 193 190 217    Basic Metabolic Panel: Recent Labs  Lab 05/07/20 0301 05/08/20 0255 05/12/20 0208  NA 135 135 134*  K 4.3 3.6 4.1  CL 100 101 101  CO2 27 26 24   GLUCOSE 112* 101* 109*  BUN 20 16 17   CREATININE 0.75 0.70 0.82  CALCIUM 8.0* 7.8* 8.0*  MG 2.0 1.8 1.9  PHOS 1.9* 3.0 3.5    Liver Function Tests: Recent Labs  Lab 05/07/20 0301 05/08/20 0255 05/12/20 0208  AST 23 19 23   ALT 21 20 26   ALKPHOS 53 44 52  BILITOT 1.1 0.8 0.9  PROT 5.1* 4.8* 4.6*  ALBUMIN 2.4* 2.3* 2.3*    CBG: No results for input(s): GLUCAP in the last 168 hours.  Microbiology Studies:   Recent Results (from the past 240 hour(s))  Culture, Urine     Status: Abnormal   Collection Time: 05/02/20  7:18 PM   Specimen: Urine, Catheterized  Result Value Ref Range Status   Specimen Description   Final     URINE, CATHETERIZED Performed at [facility], [address], [place], [place]    Special Requests   Final    NONE Performed at [facility], [address], [place], [place]    Culture >=100,000 COLONIES/mL ESCHERICHIA COLI (A)  Final   Report Status 05/04/2020 FINAL  Final   Organism ID, Bacteria ESCHERICHIA COLI (A)  Final      Susceptibility   Escherichia coli - MIC*    AMPICILLIN <=2 SENSITIVE Sensitive     CEFAZOLIN <=4 SENSITIVE Sensitive     CEFEPIME <=0.12 SENSITIVE Sensitive     CEFTRIAXONE <=0.25 SENSITIVE Sensitive     CIPROFLOXACIN <=0.25 SENSITIVE Sensitive     GENTAMICIN <=1 SENSITIVE Sensitive     IMIPENEM <=0.25 SENSITIVE Sensitive     NITROFURANTOIN <=16 SENSITIVE Sensitive     TRIMETH/SULFA <=20 SENSITIVE Sensitive     AMPICILLIN/SULBACTAM <=2 SENSITIVE Sensitive     PIP/TAZO <=4 SENSITIVE Sensitive     * >=100,000 COLONIES/mL ESCHERICHIA COLI  Surgical pcr screen     Status: Abnormal   Collection Time: 05/04/20  5:37 AM   Specimen: Nasal Mucosa; Nasal Swab  Result Value Ref Range Status   MRSA, PCR NEGATIVE NEGATIVE Final   Staphylococcus aureus POSITIVE (A) NEGATIVE Final    Comment: (NOTE) The Xpert SA Assay (FDA approved for NASAL specimens in patients 22 years of age and older), is one component of a comprehensive surveillance program. It is not intended to diagnose infection nor to guide or monitor treatment. Performed at [facility], [address] [place], [facility]      Radiology Studies:  No results found.   Scheduled Meds:   . dorzolamide  1 drop Both Eyes BID  . feeding supplement  237 mL Oral BID BM  . hydrocortisone   Rectal BID  . latanoprost  1 drop Both Eyes QHS  . losartan  50 mg Oral Daily  . multivitamin with minerals  1 tablet Oral Daily  . polyethylene glycol  17 g Oral BID  . senna-docusate  1 tablet Oral BID  . sodium chloride flush  3 mL  Intravenous Q12H  . Warfarin - Pharmacist Dosing Inpatient  Does not apply q1600    Continuous Infusions:   . sodium chloride Stopped (05/02/20 2103)  . sodium chloride       LOS: 11 days     Vernell Leep, MD, [place], [facility]. [facility]    To contact the attending provider between 7A-7P or the covering provider during after hours 7P-7A, please log into the web site [email or web address] and access using universal [place] password for that web site. If you do not have the password, please call the hospital operator.  05/12/2020, 6:33 PM

## 2020-05-13 ENCOUNTER — Inpatient Hospital Stay (HOSPITAL_COMMUNITY)
Admission: RE | Admit: 2020-05-13 | Discharge: 2020-05-21 | DRG: 561 | Disposition: A | Discharge status: Skilled Nursing Facility | Payer: Medicare HMO | Source: Intra-hospital | Attending: Physical Medicine and Rehabilitation | Admitting: Physical Medicine and Rehabilitation

## 2020-05-13 ENCOUNTER — Other Ambulatory Visit: Payer: Self-pay

## 2020-05-13 ENCOUNTER — Encounter (HOSPITAL_COMMUNITY): Payer: Self-pay | Admitting: Physical Medicine and Rehabilitation

## 2020-05-13 DIAGNOSIS — Z79899 Other long term (current) drug therapy: Secondary | ICD-10-CM | POA: Diagnosis not present

## 2020-05-13 DIAGNOSIS — Z4789 Encounter for other orthopedic aftercare: Secondary | ICD-10-CM | POA: Diagnosis not present

## 2020-05-13 DIAGNOSIS — K5900 Constipation, unspecified: Secondary | ICD-10-CM | POA: Diagnosis present

## 2020-05-13 DIAGNOSIS — R531 Weakness: Secondary | ICD-10-CM | POA: Diagnosis not present

## 2020-05-13 DIAGNOSIS — B9620 Unspecified Escherichia coli [E. coli] as the cause of diseases classified elsewhere: Secondary | ICD-10-CM | POA: Diagnosis not present

## 2020-05-13 DIAGNOSIS — S22080D Wedge compression fracture of T11-T12 vertebra, subsequent encounter for fracture with routine healing: Secondary | ICD-10-CM

## 2020-05-13 DIAGNOSIS — Z7901 Long term (current) use of anticoagulants: Secondary | ICD-10-CM | POA: Diagnosis not present

## 2020-05-13 DIAGNOSIS — M4854XD Collapsed vertebra, not elsewhere classified, thoracic region, subsequent encounter for fracture with routine healing: Secondary | ICD-10-CM | POA: Diagnosis present

## 2020-05-13 DIAGNOSIS — R2689 Other abnormalities of gait and mobility: Secondary | ICD-10-CM | POA: Diagnosis not present

## 2020-05-13 DIAGNOSIS — M6281 Muscle weakness (generalized): Secondary | ICD-10-CM | POA: Diagnosis not present

## 2020-05-13 DIAGNOSIS — M545 Low back pain, unspecified: Secondary | ICD-10-CM | POA: Diagnosis not present

## 2020-05-13 DIAGNOSIS — Z885 Allergy status to narcotic agent status: Secondary | ICD-10-CM

## 2020-05-13 DIAGNOSIS — I4891 Unspecified atrial fibrillation: Secondary | ICD-10-CM | POA: Diagnosis present

## 2020-05-13 DIAGNOSIS — S2242XD Multiple fractures of ribs, left side, subsequent encounter for fracture with routine healing: Secondary | ICD-10-CM | POA: Diagnosis not present

## 2020-05-13 DIAGNOSIS — S22000D Wedge compression fracture of unspecified thoracic vertebra, subsequent encounter for fracture with routine healing: Secondary | ICD-10-CM | POA: Diagnosis not present

## 2020-05-13 DIAGNOSIS — I10 Essential (primary) hypertension: Secondary | ICD-10-CM | POA: Diagnosis present

## 2020-05-13 DIAGNOSIS — Z888 Allergy status to other drugs, medicaments and biological substances status: Secondary | ICD-10-CM

## 2020-05-13 DIAGNOSIS — M4804 Spinal stenosis, thoracic region: Secondary | ICD-10-CM | POA: Diagnosis not present

## 2020-05-13 DIAGNOSIS — I480 Paroxysmal atrial fibrillation: Secondary | ICD-10-CM | POA: Diagnosis not present

## 2020-05-13 DIAGNOSIS — Z20822 Contact with and (suspected) exposure to covid-19: Secondary | ICD-10-CM | POA: Diagnosis not present

## 2020-05-13 DIAGNOSIS — Z981 Arthrodesis status: Secondary | ICD-10-CM | POA: Diagnosis not present

## 2020-05-13 DIAGNOSIS — G2581 Restless legs syndrome: Secondary | ICD-10-CM | POA: Diagnosis present

## 2020-05-13 DIAGNOSIS — R2681 Unsteadiness on feet: Secondary | ICD-10-CM | POA: Diagnosis not present

## 2020-05-13 DIAGNOSIS — I482 Chronic atrial fibrillation, unspecified: Secondary | ICD-10-CM | POA: Diagnosis not present

## 2020-05-13 DIAGNOSIS — Z743 Need for continuous supervision: Secondary | ICD-10-CM | POA: Diagnosis not present

## 2020-05-13 DIAGNOSIS — S22000A Wedge compression fracture of unspecified thoracic vertebra, initial encounter for closed fracture: Secondary | ICD-10-CM

## 2020-05-13 DIAGNOSIS — R1311 Dysphagia, oral phase: Secondary | ICD-10-CM | POA: Diagnosis not present

## 2020-05-13 DIAGNOSIS — R279 Unspecified lack of coordination: Secondary | ICD-10-CM | POA: Diagnosis not present

## 2020-05-13 DIAGNOSIS — M546 Pain in thoracic spine: Secondary | ICD-10-CM | POA: Diagnosis not present

## 2020-05-13 DIAGNOSIS — R41841 Cognitive communication deficit: Secondary | ICD-10-CM | POA: Diagnosis not present

## 2020-05-13 DIAGNOSIS — K5901 Slow transit constipation: Secondary | ICD-10-CM | POA: Diagnosis not present

## 2020-05-13 DIAGNOSIS — N390 Urinary tract infection, site not specified: Secondary | ICD-10-CM | POA: Diagnosis not present

## 2020-05-13 LAB — CBC
HCT: 35.3 % — ABNORMAL LOW (ref 36.0–46.0)
Hemoglobin: 11.2 g/dL — ABNORMAL LOW (ref 12.0–15.0)
MCH: 29.6 pg (ref 26.0–34.0)
MCHC: 31.7 g/dL (ref 30.0–36.0)
MCV: 93.1 fL (ref 80.0–100.0)
Platelets: 219 10*3/uL (ref 150–400)
RBC: 3.79 MIL/uL — ABNORMAL LOW (ref 3.87–5.11)
RDW: 16.2 % — ABNORMAL HIGH (ref 11.5–15.5)
WBC: 8.7 10*3/uL (ref 4.0–10.5)
nRBC: 0 % (ref 0.0–0.2)

## 2020-05-13 LAB — PROTIME-INR
INR: 2.6 — ABNORMAL HIGH (ref 0.8–1.2)
Prothrombin Time: 26.6 seconds — ABNORMAL HIGH (ref 11.4–15.2)

## 2020-05-13 MED ORDER — ADULT MULTIVITAMIN W/MINERALS CH
1.0000 | ORAL_TABLET | Freq: Every day | ORAL | Status: DC
  Administered 2020-05-14 – 2020-05-21 (×7): 1 via ORAL
  Filled 2020-05-13 (×8): qty 1

## 2020-05-13 MED ORDER — HYDROCORTISONE (PERIANAL) 2.5 % EX CREA
TOPICAL_CREAM | Freq: Two times a day (BID) | CUTANEOUS | Status: DC
  Filled 2020-05-13: qty 28.35

## 2020-05-13 MED ORDER — WARFARIN SODIUM 1 MG PO TABS: 1.0000 mg | ORAL_TABLET | ORAL | Status: DC

## 2020-05-13 MED ORDER — BISACODYL 5 MG PO TBEC
5.0000 mg | DELAYED_RELEASE_TABLET | Freq: Every day | ORAL | Status: DC | PRN
  Administered 2020-05-20: 5 mg via ORAL
  Filled 2020-05-13: qty 1

## 2020-05-13 MED ORDER — WARFARIN SODIUM 1 MG PO TABS
1.0000 mg | ORAL_TABLET | ORAL | Status: DC
  Administered 2020-05-14 – 2020-05-18 (×3): 1 mg via ORAL
  Filled 2020-05-13 (×3): qty 1

## 2020-05-13 MED ORDER — NYSTATIN 100000 UNIT/GM EX POWD
Freq: Two times a day (BID) | CUTANEOUS | Status: DC
  Filled 2020-05-13: qty 15

## 2020-05-13 MED ORDER — DIAZEPAM 5 MG PO TABS
5.0000 mg | ORAL_TABLET | Freq: Four times a day (QID) | ORAL | Status: DC | PRN
  Administered 2020-05-13 – 2020-05-19 (×6): 5 mg via ORAL
  Filled 2020-05-13 (×6): qty 1

## 2020-05-13 MED ORDER — ROPINIROLE HCL 0.25 MG PO TABS
0.2500 mg | ORAL_TABLET | Freq: Every day | ORAL | Status: DC | PRN
  Administered 2020-05-13 – 2020-05-14 (×2): 0.25 mg via ORAL
  Filled 2020-05-13 (×3): qty 1

## 2020-05-13 MED ORDER — DORZOLAMIDE HCL 2 % OP SOLN
1.0000 [drp] | Freq: Two times a day (BID) | OPHTHALMIC | Status: DC
  Administered 2020-05-13 – 2020-05-21 (×16): 1 [drp] via OPHTHALMIC
  Filled 2020-05-13 (×2): qty 10

## 2020-05-13 MED ORDER — BISACODYL 5 MG PO TBEC: 5.0000 mg | DELAYED_RELEASE_TABLET | Freq: Every day | ORAL | Status: DC | PRN

## 2020-05-13 MED ORDER — WARFARIN SODIUM 1 MG PO TABS
1.5000 mg | ORAL_TABLET | ORAL | Status: DC
  Administered 2020-05-13 – 2020-05-19 (×4): 1.5 mg via ORAL
  Filled 2020-05-13 (×5): qty 1

## 2020-05-13 MED ORDER — SENNOSIDES-DOCUSATE SODIUM 8.6-50 MG PO TABS
1.0000 | ORAL_TABLET | Freq: Two times a day (BID) | ORAL | Status: DC
  Administered 2020-05-13 – 2020-05-14 (×2): 1 via ORAL
  Filled 2020-05-13 (×4): qty 1

## 2020-05-13 MED ORDER — ENSURE ENLIVE PO LIQD
237.0000 mL | Freq: Two times a day (BID) | ORAL | Status: DC
  Administered 2020-05-13 – 2020-05-21 (×13): 237 mL via ORAL

## 2020-05-13 MED ORDER — ONDANSETRON HCL 4 MG PO TABS
4.0000 mg | ORAL_TABLET | Freq: Four times a day (QID) | ORAL | Status: DC | PRN
Start: 2020-05-13 — End: 2020-05-21

## 2020-05-13 MED ORDER — LOSARTAN POTASSIUM 50 MG PO TABS
50.0000 mg | ORAL_TABLET | Freq: Every day | ORAL | Status: DC
  Administered 2020-05-14 – 2020-05-21 (×8): 50 mg via ORAL
  Filled 2020-05-13 (×8): qty 1

## 2020-05-13 MED ORDER — ONDANSETRON HCL 4 MG/2ML IJ SOLN: 4.0000 mg | Freq: Four times a day (QID) | INTRAMUSCULAR | Status: DC | PRN

## 2020-05-13 MED ORDER — SORBITOL 70 % SOLN
30.0000 mL | Freq: Once | Status: AC
  Administered 2020-05-13: 30 mL via ORAL
  Filled 2020-05-13: qty 30

## 2020-05-13 MED ORDER — ROPINIROLE HCL 1 MG PO TABS
0.5000 mg | ORAL_TABLET | Freq: Every day | ORAL | Status: DC
  Administered 2020-05-13 – 2020-05-20 (×8): 0.5 mg via ORAL
  Filled 2020-05-13 (×8): qty 1

## 2020-05-13 MED ORDER — POLYETHYLENE GLYCOL 3350 17 G PO PACK: 17.0000 g | PACK | Freq: Two times a day (BID) | ORAL | Status: DC

## 2020-05-13 MED ORDER — OXYCODONE-ACETAMINOPHEN 5-325 MG PO TABS
1.0000 | ORAL_TABLET | ORAL | Status: DC | PRN
  Administered 2020-05-13 – 2020-05-17 (×10): 1 via ORAL
  Filled 2020-05-13 (×13): qty 1

## 2020-05-13 MED ORDER — WARFARIN SODIUM 1 MG PO TABS
1.5000 mg | ORAL_TABLET | ORAL | Status: DC
  Filled 2020-05-13: qty 1

## 2020-05-13 MED ORDER — BISACODYL 10 MG RE SUPP: 10.0000 mg | Freq: Every day | RECTAL | Status: DC | PRN

## 2020-05-13 MED ORDER — ADULT MULTIVITAMIN W/MINERALS CH: 1.0000 | ORAL_TABLET | Freq: Every day | ORAL | Status: DC

## 2020-05-13 MED ORDER — OXYCODONE-ACETAMINOPHEN 5-325 MG PO TABS: 1.0000 | ORAL_TABLET | Freq: Four times a day (QID) | ORAL | Status: DC | PRN

## 2020-05-13 MED ORDER — ACETAMINOPHEN 500 MG PO TABS: 500.0000 mg | ORAL_TABLET | Freq: Three times a day (TID) | ORAL | Status: DC

## 2020-05-13 MED ORDER — LATANOPROST 0.005 % OP SOLN
1.0000 [drp] | Freq: Every day | OPHTHALMIC | Status: DC
  Administered 2020-05-13 – 2020-05-20 (×8): 1 [drp] via OPHTHALMIC
  Filled 2020-05-13: qty 2.5

## 2020-05-13 MED ORDER — ROPINIROLE HCL 0.25 MG PO TABS
0.2500 mg | ORAL_TABLET | Freq: Two times a day (BID) | ORAL | Status: DC | PRN
  Filled 2020-05-13: qty 1

## 2020-05-13 MED ORDER — ENSURE ENLIVE PO LIQD: 237.0000 mL | Freq: Two times a day (BID) | ORAL | Status: DC

## 2020-05-13 MED ORDER — WARFARIN - PHARMACIST DOSING INPATIENT
Freq: Every day | Status: DC
  Administered 2020-05-19: 1.5

## 2020-05-13 MED ORDER — SENNOSIDES-DOCUSATE SODIUM 8.6-50 MG PO TABS: 1.0000 | ORAL_TABLET | Freq: Two times a day (BID) | ORAL | Status: DC

## 2020-05-13 MED ORDER — POLYETHYLENE GLYCOL 3350 17 G PO PACK
17.0000 g | PACK | Freq: Two times a day (BID) | ORAL | Status: DC
  Administered 2020-05-14 – 2020-05-17 (×4): 17 g via ORAL
  Filled 2020-05-13 (×9): qty 1

## 2020-05-13 MED ORDER — OXYCODONE HCL ER 10 MG PO T12A
10.0000 mg | EXTENDED_RELEASE_TABLET | Freq: Two times a day (BID) | ORAL | Status: DC
  Administered 2020-05-13 – 2020-05-19 (×11): 10 mg via ORAL
  Filled 2020-05-13 (×11): qty 1

## 2020-05-13 NOTE — Discharge Instructions (Signed)

## 2020-05-13 NOTE — Progress Notes (Signed)
ANTICOAGULATION CONSULT NOTE  Pharmacy Consult:  Coumadin Indication: atrial fibrillation  Allergies  Allergen Reactions  . Amlodipine Besylate     Other reaction(s): high dose make feet swell  . Codeine Nausea And Vomiting  . Meperidine Nausea Only  . Metoprolol Succinate [Metoprolol]     Other reaction(s): effects her breathing  . Oxycodone Nausea And Vomiting    Patient Measurements: Height: 5\' 3"  (160 cm) Weight: 76.9 kg (169 lb 8.5 oz) IBW/kg (Calculated) : 52.4  Vital Signs: Temp: 97.9 F (36.6 C) (01/06 0540) Temp Source: Oral (01/06 0540) BP: 135/75 (01/06 0540) Pulse Rate: 93 (01/06 0540)  Labs: Recent Labs    05/11/20 0323 05/12/20 0208 05/13/20 0150  HGB  --  11.4* 11.2*  HCT  --  35.3* 35.3*  PLT  --  217 219  LABPROT 21.2* 25.9* 26.6*  INR 1.9* 2.5* 2.6*  CREATININE  --  0.82  --     Estimated Creatinine Clearance: 44.8 mL/min (by C-G formula based on SCr of 0.82 mg/dL).   Assessment: 87 yo female admitted on 05/01/2020 with back pain found to have T11 compression fracture. Patient is s/p thoracic 10-11 arthrodesis with neurosurgery.  Patient was on Coumadin PTA for Afib, which was reversed for surgery and then resumed on 05/06/20.   INR therapeutic; no bleeding reported.  Goal of Therapy:  INR 2-3 Monitor platelets by anticoagulation protocol: Yes   Plan:  Resume home Coumadin regimen of 1mg  alternating with 1.5mg  PO every other day Daily PT / INR for now   Brooke Payes D. , PharmD, BCPS, BCCCP 05/13/2020, 8:39 AM

## 2020-05-13 NOTE — Discharge Summary (Signed)
Physician Discharge Summary  Danielle Thomas JQB:[id number] DOB: 09-22-1927  PCP: Cari Caraway, MD  Admitted from: Home Discharged to: CIR  Admit date: 05/01/2020 Discharge date: 05/13/2020  Recommendations for Outpatient Follow-up:    Follow-up Information    MD at inpatient rehab Follow up.   Why: To be seen in 2 to 3 days with repeat labs (CBC, BMP, PT & INR).  Kindly consult pharmacy for Coumadin management.  Patient will need scripts for new medication started in the hospital or in CIR at time of discharge from CIR.       Cari Caraway, MD. Schedule an appointment as soon as possible for a visit.   Specialty: Family Medicine Why: Upon discharge from inpatient rehab. Contact information: [place] [address] [phone]        Ashok Pall, MD. Schedule an appointment as soon as possible for a visit.   Specialty: Neurosurgery Contact information: [address] [place] [address] [phone]             Recommend outpatient vascular surgery consultation for further evaluation and management of SMA and celiac trunk stenosis noted during this hospitalization   Home Health: N/A    Equipment/Devices: TBD at CIR    Discharge Condition: Improved and stable.   Code Status: Partial Code Diet recommendation:  Discharge Diet Orders (From admission, onward)    Start     Ordered   05/13/20 0000  Diet - low sodium heart healthy        05/13/20 1207           Discharge Diagnoses:  Principal Problem:   Compression fracture of T11 vertebra ([place]) Active Problems:   Atrial fibrillation (HCC)   Essential hypertension, benign   Hypokalemia   Multiple fractures of ribs, left side, initial encounter for closed fracture   Closed wedge compression fracture of T11 vertebra [facility])   Brief Summary: 87 year old female with past medical history significant for atrial fibrillation on Coumadin, hypertension, restless leg  syndrome, recently admitted 03/27/2020-03/29/2020 for multiple electrolyte abnormalities and also found to have T11 compression fracture which was managed conservatively with pain medications, TLSO brace and she was discharged to SNF with plans for outpatient follow-up.  She reportedly stayed at [facility] for 2 weeks prior to returning home but since then has essentially been bedbound due to difficulty ambulating secondary to continued back pain.  Prior to that she ambulated with the help of a walker.  She lives alone.  MRI thoracic and lumbar spine without contrast showed progressed T11 compression fracture with loss of height increased from 20 to 40%, new retropulsion of bone resulting in mild spinal stenosis with mild cord mass-effect but no spinal cord signal abnormality noted.  Also noted acute to subacute posterior left rib fractures 9 through 11.  Neurosurgery was consulted.  S/p surgical intervention by Dr. Christella Noa on 05/04/2020.  Insurance had denied CIR, which was overturned on peer to peer review with Psychologist, occupational on 1/5.  Family have finally decided that patient should go to CIR.   Assessment & Plan:   Intractable back pain due to progressed T11 compression fracture Neurosurgery consulted.  S/p thoracic 10-11 arthrodesis, thoracic 9-12 percutaneous pedicle screws on 12/8.    Pain control is somewhat of a challenge.  Added scheduled Tylenol yesterday for a couple of days until her acute pain subsides.  Continue prior home dose of Percocet at reduced dose for more severe pain not controlled by Tylenol.  Judicious use of opioids and avoid escalating due to myriad of side effects in this frail elderly patient.  Could consider topical lidocaine patch if pain remains uncontrolled.  Today RN reported that patient's pain may be somewhat better compared to yesterday.  As per RN, patient noncompliant with consistent use of brace, encouraged.  Has been wearing the brace today.  Continue therapies at  CIR.  Outpatient follow-up with Dr. Christella Noa.  Rehab MD to consult Dr. Christella Noa for any acute needs during admission to rehab.  Constipation Aggressive bowel regimen.  Rectal burning likely related to this has resolved.  Passing flatus.  Had a couple of BMs to 3 days ago.  Orthostatic hypotension Noted by PT on 1/5 although not reproducible when checked later in the morning.  Ongoing poor oral intake.    Briefly hydrated with IV fluids.  Oral fluids encouraged.  Orthostatic hypotension seems to have resolved today.  Atrial fibrillation, chronic Coumadin held for surgery.  Also received vitamin K to reverse INR for surgery.  Postop Coumadin resumed 48 hours after discussing with surgery.  INR 2.5 >2.6.  Coumadin per pharmacy.  Rate controlled.  Acute to subacute left 9-11 rib fractures Supportive treatment including pain management and incentive spirometry and mobilization.  Essential hypertension Controlled.  Continue losartan.  Amlodipine was discontinued. HCTZ discontinued at this time due to inconsistent oral intake and risk for dehydration, hyponatremia and AKI.  Hypokalemia Replaced.  Magnesium 1.9.  Hypophosphatemia Resolved.  Hyponatremia Improved or almost resolved.  Restless leg syndrome Continue Requip.  Glaucoma Continue home meds.  Leukocytosis Resolved.  E. coli UTI, POA Completed course of antibiotics initially with IV ceftriaxone followed by Keflex.  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  Failure to thrive Multifactorial due to very advanced age and multiple severe significant acute on chronic illnesses as noted above.  May consider palliative care consultation as outpatient.  Postop acute blood loss anemia Stable.  Microscopic hematuria Probably multifactorial related to acute cystitis and also had Foley catheter early  on in admission.  Consider repeating urine microscopy in a couple of weeks and if this persists then may need further evaluation including urology consultation.  Body mass index is 30.03 kg/m.  Nutritional Status Nutrition Problem: Inadequate oral intake Etiology: poor appetite Signs/Symptoms: per patient/family report Interventions: Ensure Enlive (each supplement provides 350kcal and 20 grams of protein)       Consultants:   Neurosurgery  Procedures:   As above    Discharge Instructions  Discharge Instructions    Call MD for:  difficulty breathing, headache or visual disturbances   Complete by: As directed    Call MD for:  extreme fatigue   Complete by: As directed    Call MD for:  persistant dizziness or light-headedness   Complete by: As directed    Call MD for:  persistant nausea and vomiting   Complete by: As directed    Call MD for:  redness, tenderness, or signs of infection (pain, swelling, redness, odor or green/yellow discharge around incision site)   Complete by: As directed    Call MD for:  severe uncontrolled pain   Complete by: As directed    Call MD for:  temperature >100.4   Complete by: As directed    Diet - low sodium heart healthy   Complete by: As directed    Discharge instructions   Complete by: As  directed    Maintain Aspen lumbar brace at all times.  Discuss with Dr. Christella Noa regarding any further management or adjustments.   Increase activity slowly   Complete by: As directed    No wound care   Complete by: As directed        Medication List    STOP taking these medications   amLODipine 10 MG tablet Commonly known as: NORVASC   hydrochlorothiazide 12.5 MG capsule Commonly known as: MICROZIDE     TAKE these medications   acetaminophen 500 MG tablet Commonly known as: TYLENOL Take 1 tablet (500 mg total) by mouth 3 (three) times daily. For acute pain for 3 to 5 days and then consider changing to as needed.   bisacodyl 5 MG  EC tablet Commonly known as: DULCOLAX Take 1 tablet (5 mg total) by mouth daily as needed for moderate constipation.   dorzolamide 2 % ophthalmic solution Commonly known as: TRUSOPT Place 1 drop into both eyes 2 (two) times daily.   feeding supplement Liqd Take 237 mLs by mouth 2 (two) times daily between meals.   losartan 50 MG tablet Commonly known as: COZAAR Take 50 mg by mouth daily.   methocarbamol 500 MG tablet Commonly known as: ROBAXIN Take 250 mg by mouth daily as needed for muscle spasms.   multivitamin with minerals Tabs tablet Take 1 tablet by mouth daily. Start taking on: May 14, 2020   oxyCODONE-acetaminophen 5-325 MG tablet Commonly known as: PERCOCET/ROXICET Take 1 tablet by mouth every 6 (six) hours as needed for moderate pain or severe pain. What changed:   when to take this  reasons to take this   polyethylene glycol 17 g packet Commonly known as: MIRALAX / GLYCOLAX Take 17 g by mouth 2 (two) times daily. What changed:   when to take this  reasons to take this   rOPINIRole 0.25 MG tablet Commonly known as: REQUIP Take 0.25 mg by mouth 2 (two) times daily as needed (restless legs). Restless leg   senna-docusate 8.6-50 MG tablet Commonly known as: Senokot-S Take 1 tablet by mouth 2 (two) times daily. What changed:   when to take this  reasons to take this   travoprost (benzalkonium) 0.004 % ophthalmic solution Commonly known as: TRAVATAN Place 1 drop into both eyes at bedtime.   warfarin 3 MG tablet Commonly known as: COUMADIN Take 1.5 mg by mouth every other day.   warfarin 1 MG tablet Commonly known as: COUMADIN Take 1 mg by mouth every other day.      Allergies  Allergen Reactions  . Amlodipine Besylate     Other reaction(s): high dose make feet swell  . Codeine Nausea And Vomiting  . Meperidine Nausea Only  . Metoprolol Succinate [Metoprolol]     Other reaction(s): effects her breathing  . Oxycodone Nausea And  Vomiting      Procedures/Studies: DG Ribs Unilateral W/Chest Right  Result Date: 05/01/2020 CLINICAL DATA:  Rib and back pain, recent T11 fracture EXAM: RIGHT RIBS AND CHEST - 3+ VIEW COMPARISON:  11/26/2017 FINDINGS: No displaced fracture or other bone lesions are seen involving the ribs. There is no evidence of pneumothorax or pleural effusion. Cardiomegaly. No acute appearing airspace opacity. Surgical clips and suture project over the right lung base. IMPRESSION: 1. No displaced rib fracture or other acute abnormality of the right ribs. No pneumothorax or pleural effusion. 2. The thoracic spine is not well imaged on frontal and oblique views provided per report of recent T11  fracture. 3. Cardiomegaly. 4. No acute appearing airspace opacity. Electronically Signed   By: Eddie Candle M.D.   On: 05/01/2020 14:54   DG Thoracic Spine 2 View  Result Date: 05/04/2020 CLINICAL DATA:  Surgery, elective. Additional history provided: Thoracic 9-thoracic 12 Percutaneous pedicle screws. Provided fluoroscopy time 4 minutes, 24 seconds (110.22 mGy). EXAM: THORACIC SPINE 2 VIEWS; DG C-ARM 1-60 MIN COMPARISON:  Thoracic spine MRI 05/01/2020. FINDINGS: Intraoperative fluoroscopic images of the lower thoracic spine are submitted, 2 images total. Redemonstrated T11 compression fracture. A posterior spinal fusion construct (utilizing pedicle screws and vertical interconnecting rods) spans the T9-T12 levels. No pedicle screw is present on the right at T11. IMPRESSION: Two intraoperative fluoroscopic images of the lower thoracic spine, as described. Electronically Signed   By: Kellie Simmering DO   On: 05/04/2020 12:30   MR THORACIC SPINE WO CONTRAST  Result Date: 05/01/2020 CLINICAL DATA:  87 year old female with recent T11 fracture. Rib and back pain. EXAM: MRI THORACIC AND LUMBAR SPINE WITHOUT CONTRAST TECHNIQUE: Multiplanar and multiecho pulse sequences of the thoracic and lumbar spine were obtained without  intravenous contrast. COMPARISON:  Rib radiographs earlier 03/27/2020. Today. CT Abdomen and Pelvis FINDINGS: MRI THORACIC SPINE FINDINGS Limited cervical spine imaging: Previous cervical ACDF, appears to be at the C4-C5 levels. Thoracic spine segmentation: Appears to be normal aside from hypoplastic ribs at T12, as suggested on CT Abdomen and Pelvis last month. Alignment: Normal thoracic vertebral height and alignment above the T11 level, see below. Vertebrae: Thoracic vertebrae T1 through T8 are intact with normal marrow signal. T9 vertebral body appears intact but there is an acute to subacute fracture of the posterior left 9th rib with marrow edema (series 17, image 17 and series 21, image 30). T10 vertebra appears intact but there is a fracture of the posterior left 10th rib (same image on series 17 and series 21, image 33). Progress T11 compression fracture since last month with comminution, confluent marrow edema, vertebral loss of height now up to 40% (previously less than 20%). New retropulsion of bone (series 17, image 10) with mild spinal stenosis (series 21, image 37). Mild associated spinal cord mass effect (series 20, image 37 and series 29, image 2). No spinal cord edema or myelomalacia suspected. Moderate to severe left and moderate right T11 foraminal stenosis also is increased. Superimposed left posterior levin through fracture suspected. The T12 vertebra appears intact with superimposed benign vertebral body hemangioma. Cord: Capacious thoracic spinal canal above T11. See T11 spinal stenosis and cord mass effect details above. No definite cord signal abnormality. Conus medullaris is at T12-L1. Paraspinal and other soft tissues: Left paraspinal soft tissue edema from the 9th rib through T12 vertebral level. No drainable paraspinal fluid collection. Trace bilateral layering pleural effusions. Small round right lung base fat density nodule appears stable from last month. Disc levels: No age advanced  degenerative changes superimposed on the acute osseous abnormalities detailed above. MRI LUMBAR SPINE FINDINGS Segmentation:  Normal. Alignment: Moderate levoconvex lumbar scoliosis appears stable from last month. Stable lumbar vertebral height and alignment. Vertebrae: No marrow edema or evidence of acute osseous abnormality in the lumbar spine. Intact visible sacrum and SI joints. Conus medullaris and cauda equina: Conus extends to the T12-L1 level. Conus and cauda equina appear normal. Paraspinal and other soft tissues: Left posterolateral lower thoracic paraspinal soft tissue edema but no acute lumbar paraspinal soft tissue finding. Chronic lumbar paraspinal muscle atrophy. Visible abdominal viscera appear stable since last month. Disc levels: Advanced lumbar  spine degeneration in the setting of levoconvex scoliosis. Up to mild associated lumbar spinal stenosis. IMPRESSION: MR THORACIC SPINE IMPRESSION 1. Progressed T11 compression fracture since last month. Increased comminution. Loss of height increased from 20% to 40%. And new retropulsion of bone resulting in mild spinal stenosis with mild cord mass effect but no spinal cord signal abnormality. 2. Associated acute to subacute posterior left rib fractures 9 through 11. 3. Left paraspinal soft tissue edema from T9 to T12. 4. Trace layering pleural effusions. MR LUMBAR SPINE IMPRESSION 1. No acute osseous abnormality in the lumbar spine. 2. Advanced lumbar spine degeneration the setting of moderate levoconvex lumbar scoliosis. Electronically Signed   By: Genevie Ann M.D.   On: 05/01/2020 17:50   MR LUMBAR SPINE WO CONTRAST  Result Date: 05/01/2020 CLINICAL DATA:  87 year old female with recent T11 fracture. Rib and back pain. EXAM: MRI THORACIC AND LUMBAR SPINE WITHOUT CONTRAST TECHNIQUE: Multiplanar and multiecho pulse sequences of the thoracic and lumbar spine were obtained without intravenous contrast. COMPARISON:  Rib radiographs earlier 03/27/2020. Today.  CT Abdomen and Pelvis FINDINGS: MRI THORACIC SPINE FINDINGS Limited cervical spine imaging: Previous cervical ACDF, appears to be at the C4-C5 levels. Thoracic spine segmentation: Appears to be normal aside from hypoplastic ribs at T12, as suggested on CT Abdomen and Pelvis last month. Alignment: Normal thoracic vertebral height and alignment above the T11 level, see below. Vertebrae: Thoracic vertebrae T1 through T8 are intact with normal marrow signal. T9 vertebral body appears intact but there is an acute to subacute fracture of the posterior left 9th rib with marrow edema (series 17, image 17 and series 21, image 30). T10 vertebra appears intact but there is a fracture of the posterior left 10th rib (same image on series 17 and series 21, image 33). Progress T11 compression fracture since last month with comminution, confluent marrow edema, vertebral loss of height now up to 40% (previously less than 20%). New retropulsion of bone (series 17, image 10) with mild spinal stenosis (series 21, image 37). Mild associated spinal cord mass effect (series 20, image 37 and series 29, image 2). No spinal cord edema or myelomalacia suspected. Moderate to severe left and moderate right T11 foraminal stenosis also is increased. Superimposed left posterior levin through fracture suspected. The T12 vertebra appears intact with superimposed benign vertebral body hemangioma. Cord: Capacious thoracic spinal canal above T11. See T11 spinal stenosis and cord mass effect details above. No definite cord signal abnormality. Conus medullaris is at T12-L1. Paraspinal and other soft tissues: Left paraspinal soft tissue edema from the 9th rib through T12 vertebral level. No drainable paraspinal fluid collection. Trace bilateral layering pleural effusions. Small round right lung base fat density nodule appears stable from last month. Disc levels: No age advanced degenerative changes superimposed on the acute osseous abnormalities detailed  above. MRI LUMBAR SPINE FINDINGS Segmentation:  Normal. Alignment: Moderate levoconvex lumbar scoliosis appears stable from last month. Stable lumbar vertebral height and alignment. Vertebrae: No marrow edema or evidence of acute osseous abnormality in the lumbar spine. Intact visible sacrum and SI joints. Conus medullaris and cauda equina: Conus extends to the T12-L1 level. Conus and cauda equina appear normal. Paraspinal and other soft tissues: Left posterolateral lower thoracic paraspinal soft tissue edema but no acute lumbar paraspinal soft tissue finding. Chronic lumbar paraspinal muscle atrophy. Visible abdominal viscera appear stable since last month. Disc levels: Advanced lumbar spine degeneration in the setting of levoconvex scoliosis. Up to mild associated lumbar spinal stenosis. IMPRESSION: MR  THORACIC SPINE IMPRESSION 1. Progressed T11 compression fracture since last month. Increased comminution. Loss of height increased from 20% to 40%. And new retropulsion of bone resulting in mild spinal stenosis with mild cord mass effect but no spinal cord signal abnormality. 2. Associated acute to subacute posterior left rib fractures 9 through 11. 3. Left paraspinal soft tissue edema from T9 to T12. 4. Trace layering pleural effusions. MR LUMBAR SPINE IMPRESSION 1. No acute osseous abnormality in the lumbar spine. 2. Advanced lumbar spine degeneration the setting of moderate levoconvex lumbar scoliosis. Electronically Signed   By: Genevie Ann M.D.   On: 05/01/2020 17:50   DG C-Arm 1-60 Min  Result Date: 05/04/2020 CLINICAL DATA:  Surgery, elective. Additional history provided: Thoracic 9-thoracic 12 Percutaneous pedicle screws. Provided fluoroscopy time 4 minutes, 24 seconds (110.22 mGy). EXAM: THORACIC SPINE 2 VIEWS; DG C-ARM 1-60 MIN COMPARISON:  Thoracic spine MRI 05/01/2020. FINDINGS: Intraoperative fluoroscopic images of the lower thoracic spine are submitted, 2 images total. Redemonstrated T11 compression  fracture. A posterior spinal fusion construct (utilizing pedicle screws and vertical interconnecting rods) spans the T9-T12 levels. No pedicle screw is present on the right at T11. IMPRESSION: Two intraoperative fluoroscopic images of the lower thoracic spine, as described. Electronically Signed   By: Kellie Simmering DO   On: 05/04/2020 12:30      Subjective: Ongoing back pain, wants brace tightened for more comfort.  As per RN, back pain seems to be somewhat better compared to yesterday.  As per discussion with inpatient rehab coordinator who knows patient from before, this is the best the patient has looked in the last week.  No chest pain, dyspnea, dizziness or lightheadedness reported.  Discharge Exam:  Vitals:   05/12/20 2120 05/13/20 0540 05/13/20 0915 05/13/20 1030  BP: (!) 136/93 135/75 117/73   Pulse: 94 93 90   Resp: 18 15 17  (!) 22  Temp: 97.8 F (36.6 C) 97.9 F (36.6 C) 97.6 F (36.4 C)   TempSrc: Oral Oral Oral   SpO2: 95% 96% 94% 93%  Weight:      Height:        General exam: Elderly female, moderately built and frail, lying in right lateral position, not in overt distress or discomfort Respiratory system: Clear to auscultation. Respiratory effort normal. Cardiovascular system: S1 & S2 heard, RRR. No JVD, murmurs, rubs, gallops or clicks. No pedal edema.  Not on telemetry Gastrointestinal system: Abdomen is nondistended, soft and nontender. No organomegaly or masses felt. Normal bowel sounds heard. Central nervous system: Alert and oriented x2. No focal neurological deficits. Extremities: Symmetric 5 x 5 power. Skin: Extensive bruising of her back, mostly across lower mid back and left upper back but also extends to the upper midline. Psychiatry: Judgement and insight appear somewhat impaired. Mood & affect appropriate.    The results of significant diagnostics from this hospitalization (including imaging, microbiology, ancillary and laboratory) are listed below for  reference.     Microbiology: Recent Results (from the past 240 hour(s))  Surgical pcr screen     Status: Abnormal   Collection Time: 05/04/20  5:37 AM   Specimen: Nasal Mucosa; Nasal Swab  Result Value Ref Range Status   MRSA, PCR NEGATIVE NEGATIVE Final   Staphylococcus aureus POSITIVE (A) NEGATIVE Final    Comment: (NOTE) The Xpert SA Assay (FDA approved for NASAL specimens in patients 72 years of age and older), is one component of a comprehensive surveillance program. It is not intended to diagnose infection  nor to guide or monitor treatment. Performed at [facility], [place] [address], [place], [place] [address]      Labs: CBC: Recent Labs  Lab 05/07/20 0301 05/08/20 0255 05/10/20 0444 05/12/20 0208 05/13/20 0150  WBC 11.4* 9.1 9.1 8.6 8.7  NEUTROABS 8.6* 6.9  --  6.6  --   HGB 12.6 12.3 12.9 11.4* 11.2*  HCT 37.3 38.3 37.4 35.3* 35.3*  MCV 89.2 91.6 88.4 92.2 93.1  PLT 200 193 190 217 847    Basic Metabolic Panel: Recent Labs  Lab 05/07/20 0301 05/08/20 0255 05/12/20 0208  NA 135 135 134*  K 4.3 3.6 4.1  CL 100 101 101  CO2 27 26 24   GLUCOSE 112* 101* 109*  BUN 20 16 17   CREATININE 0.75 0.70 0.82  CALCIUM 8.0* 7.8* 8.0*  MG 2.0 1.8 1.9  PHOS 1.9* 3.0 3.5    Liver Function Tests: Recent Labs  Lab 05/07/20 0301 05/08/20 0255 05/12/20 0208  AST 23 19 23   ALT 21 20 26   ALKPHOS 53 44 52  BILITOT 1.1 0.8 0.9  PROT 5.1* 4.8* 4.6*  ALBUMIN 2.4* 2.3* 2.3*     Urinalysis    Component Value Date/Time   COLORURINE AMBER (A) 05/02/2020 1442   APPEARANCEUR TURBID (A) 05/02/2020 1442   LABSPEC 1.023 05/02/2020 1442   PHURINE 5.0 05/02/2020 1442   GLUCOSEU NEGATIVE 05/02/2020 1442   HGBUR LARGE (A) 05/02/2020 1442   BILIRUBINUR NEGATIVE 05/02/2020 1442   [place] 05/02/2020 1442   PROTEINUR 100 (A) 05/02/2020 1442   NITRITE NEGATIVE 05/02/2020 1442   LEUKOCYTESUR MODERATE (A) 05/02/2020 1442    I discussed in detail with  patient's daughter via phone, updated care and answered all questions.  She was appreciative of the call and updates.  Time coordinating discharge: 40 minutes  SIGNED:  Vernell Leep, MD, [place], [facility]. [facility]  To contact the attending provider between 7A-7P or the covering provider during after hours 7P-7A, please log into the web site [email or web address] and access using universal [place] password for that web site. If you do not have the password, please call the hospital operator.

## 2020-05-13 NOTE — Care Management Important Message (Signed)
Important Message  Patient Details  Name: Danielle Thomas MRN: [id number] Date of Birth: 1927-06-03   Medicare Important Message Given:  Yes     Ary Rudnick P Batsheva Stevick 05/13/2020, 11:42 AM

## 2020-05-13 NOTE — Progress Notes (Signed)
Inpatient Rehabilitation  Patient information reviewed and entered into eRehab system by Crystin Lechtenberg M. Jaheem Hedgepath, M.A., CCC/SLP, PPS Coordinator.  Information including medical coding, functional ability and quality indicators will be reviewed and updated through discharge.    

## 2020-05-13 NOTE — Progress Notes (Signed)
Physical Therapy Treatment Patient Details Name: Danielle Thomas MRN: [id number] DOB: 06-29-1927 Today's Date: 05/13/2020    History of Present Illness Pt is a 87 y.o. female with PMH significant for a-fib on Coumadin, HTN, restless leg syndrome. Patient was recently admitted from 03/27/2020-03/29/2020 for multiple electrolyte abnormalities. She was also found to have a T11 compression fracture which was managed with pain medication and TLSO brace. She was discharged to SNF with plan for outpatient follow-up, however returned to the ED with uncontrolled back pain. This patient underwent T10-T11 fusion on 05/04/2020 per Dr. Franky Macho.    PT Comments    Pt up on [facility] on PTA arrival to room, pt agreeable to therapy session and with good participation and fair tolerance for mobility. Pt orthostatic vitals taken during session (NT present to assist due to pt impulsivity) and pt noted to be tachycardic/anxious, deferred longer gait trial for safety. Pt mostly minA for safety with transfers and stepping near EOB with RW as well as bed mobility, needs multimodal cues due to anxiety and pt easily distracted. Pt performed supine BLE A/AAROM therapeutic exercises with good tolerance, needing cues for technique/reps. Pt encouraged to sit up in chair position but deferred at this time, RN notified pt needs to sit upright later in day for improved hemodynamics. Pt continues to benefit from skilled rehab in a post acute setting to maximize functional gains before returning home.   Follow Up Recommendations  CIR     Equipment Recommendations  Rolling walker with 5" wheels;3in1 (PT)    Recommendations for Other Services Rehab consult     Precautions / Restrictions Precautions Precautions: Fall;Back Precaution Booklet Issued: Yes (comment) (unable to recall) Precaution Comments: LSO Required Braces or Orthoses: Spinal Brace Spinal Brace: Lumbar corset;Applied in sitting position Restrictions Weight Bearing  Restrictions: No    Mobility  Bed Mobility Overal bed mobility: Needs Assistance Bed Mobility: Rolling;Sit to Sidelying Rolling: Supervision       Sit to sidelying: Min assist General bed mobility comments: pt impulsive, verbal cues to not reach behind self when return to supine, educated on log roll technique to adhere to back precautions  Transfers Overall transfer level: Needs assistance Equipment used: Rolling walker (2 wheeled) Transfers: Sit to/from Stand Sit to Stand: Min guard         General transfer comment: verbal cues for safe hand placement with poor carryover of cues; needs hand over hand assist for proper placement when sitting; x3 reps  Ambulation/Gait             General Gait Details: deferred gait progression as pt tachycardic in standing (HR 115 bpm), pt performed pivot transfer and stepping at bedside ~32ft with minA and RW   Stairs             Wheelchair Mobility    Modified Rankin (Stroke Patients Only)       Balance Overall balance assessment: Needs assistance Sitting-balance support: Feet supported Sitting balance-Leahy Scale: Fair Sitting balance - Comments: no LOB but pt typically using BUE for support due to anxiety   Standing balance support: Bilateral upper extremity supported Standing balance-Leahy Scale: Poor Standing balance comment: needs RW for support and external assist due to impulsivity                            Cognition Arousal/Alertness: Awake/alert Behavior During Therapy: Impulsive;Anxious Overall Cognitive Status: Impaired/Different from baseline Area of Impairment: Safety/judgement;Memory  Memory: Decreased recall of precautions (recalls 2/3 precs but needs reinforcement for compliance) Following Commands: Follows one step commands with increased time (pt extremely HOH) Safety/Judgement: Decreased awareness of safety Awareness: Emergent Problem Solving: Requires  verbal cues;Requires tactile cues;Difficulty sequencing General Comments: pt impulsive and quick to move, needs cues for back precautions and is unable to don/doff brace without assist      Exercises General Exercises - Lower Extremity Ankle Circles/Pumps: AROM;Strengthening;Both;10 reps;Supine Gluteal Sets: AROM;Strengthening;Both;5 reps;Supine Short Arc Quad: AROM;Strengthening;Both;10 reps;Supine Heel Slides: AROM;Strengthening;Both;10 reps;Supine Hip ABduction/ADduction: AROM;Strengthening;Both;Supine;AAROM;20 reps (x10 hip abd, x10 hip aDduct pillow squeezes)    General Comments General comments (skin integrity, edema, etc.): see orthostatic BP assessment, pt not orthostatic this session however somewhat tachycardic with mobility/static standing at bedside and deferred gait trial for safety      Pertinent Vitals/Pain Pain Assessment: Faces Faces Pain Scale: Hurts little more Pain Location: back Pain Descriptors / Indicators: Sore Pain Intervention(s): Monitored during session;Repositioned;Premedicated before session   Orthostatic BPs  Supine 146/86 (102);   94 HR  Sitting 143/85 (101);  103 HR  Standing 142/80 (98);    105 HR  Standing after 3 min 136/92 (103); 115 HR   Home Living                      Prior Function            PT Goals (current goals can now be found in the care plan section) Acute Rehab PT Goals Patient Stated Goal: to get better, pain relief PT Goal Formulation: With patient Time For Goal Achievement: 05/19/20 Potential to Achieve Goals: Fair Progress towards PT goals: Progressing toward goals (limited progress due to tachycardia/anxiety)    Frequency    Min 4X/week      PT Plan Current plan remains appropriate    Co-evaluation              AM-PAC PT "6 Clicks" Mobility   Outcome Measure  Help needed turning from your back to your side while in a flat bed without using bedrails?: None Help needed moving from lying on  your back to sitting on the side of a flat bed without using bedrails?: A Little Help needed moving to and from a bed to a chair (including a wheelchair)?: A Little Help needed standing up from a chair using your arms (e.g., wheelchair or bedside chair)?: A Little Help needed to walk in hospital room?: A Little Help needed climbing 3-5 steps with a railing? : A Lot 6 Click Score: 18    End of Session Equipment Utilized During Treatment: Gait belt;Back brace Activity Tolerance: Patient tolerated treatment well Patient left: in bed;with call bell/phone within reach;with bed alarm set;with family/visitor present Nurse Communication: Mobility status PT Visit Diagnosis: Other abnormalities of gait and mobility (R26.89);Pain Pain - part of body:  (midline back pain)     Time: [id number] PT Time Calculation (min) (ACUTE ONLY): 25 min  Charges:  $Therapeutic Exercise: 8-22 mins $Therapeutic Activity: 8-22 mins                     Chavez Rosol P., PTA Acute Rehabilitation Services Pager: [phone] Office: [place] 05/13/2020, 11:01 AM

## 2020-05-13 NOTE — Progress Notes (Signed)
Inpatient Rehabilitation Medication Review by a Pharmacist  A complete drug regimen review was completed for this patient to identify any potential clinically significant medication issues.  Clinically significant medication issues were identified:  no  Check AMION for pharmacist assigned to patient if future medication questions/issues arise during this admission.  Pharmacist comments: Scheduled TID inpatient Tylenol discontinued.  Time spent performing this drug regimen review (minutes):  5-97min  Shevy Yaney S. Merilynn Finland, PharmD, BCPS Clinical Staff Pharmacist [email or web address] Pasty Spillers 05/13/2020 3:59 PM

## 2020-05-13 NOTE — Progress Notes (Signed)
Danielle Heys, MD  Physician  Physical Medicine and Rehabilitation  PMR Pre-admission      Signed  Date of Service:  05/06/2020  1:36 PM      Related encounter: ED to Hosp-Admission (Current) from 05/01/2020 in [facility]          Show:Clear all [x] Manual[x] Template[x] Copied  Added by: [x] Wadie Mattie, Vertis Kelch, RN[x] Danielle Heys, MD   [] Hover for details  PMR Admission Coordinator Pre-Admission Assessment   Patient: Danielle Thomas is an 87 y.o., female MRN: [id number] DOB: Jan 17, 1928 Height: 5' 3"  (160 cm) Weight: 76.9 kg   Insurance Information HMO:     PPO:      PCP:      IPA:      80/20:      OTHER:  PRIMARY: [employer]      Policy#: [id number]      Subscriber: pt CM Name: Danielle Thomas     Phone#: [phone] ext [phone]   Fax#: [phone] Pre-Cert#: [id number] approved for 7 days after peer to peer completed   F/u with [facility] ext [phone] fax [phone]  Employer:  Benefits:  Phone #: [phone]     Name: 12/30 Eff. Date: 05/09/2019  Deduct: none      Out of Pocket Max: $3900      Life Max: none CIR: $295 co pay per day days 1 until 6      SNF: no copay days 1 until 20; $184 co pay per day days 21 until 100 Outpatient: $10 to $40 per visit     Co-Pay: visits per medical neccesity Home Health: 100%      Co-Pay: visits per medical neccesity DME: 80%     Co-Pay: 20% Providers: in network  SECONDARY: none      Policy#:      Phone#:    Development worker, community:       Phone#:    The Engineer, petroleum" for patients in Inpatient Rehabilitation Facilities with attached "Privacy Act [place] Records" was provided and verbally reviewed with: Patient and Family   Emergency Contact Information         Contact Information     Name Relation Home Work Danielle Daughter [phone]   Thomas, Danielle Daughter     [phone]         Current Medical History   Patient Admitting Diagnosis: T 11 Compression fracture   History of Present Illness:   Danielle Thomas is a 87 year old right-handed female with history of atrial fibrillation maintained on Coumadin, hypertension, restless leg syndrome.   She has family that check on her routinely.  Patient with recent admission 03/27/2020 to 03/29/2020 for multiple electrolyte abnormalities.  She was found to have T11 compression fracture which was managed with pain medication and a TLSO back brace.  She was discharged to skilled nursing facility at that time.  She was in a skilled nursing facility for 2 weeks before returning home and has essentially been bedbound with difficulty in ambulation.  Presented 05/01/2020 with increasing back pain.  Admission chemistry sodium 134 potassium 2.9 glucose 130 BUN 22 creatinine 0.90, WBC 11,300 hemoglobin 16.9, urine culture greater 100,000 E. coli.  MRI thoracic lumbar spine showed progressed T11 compression fracture since last month increased comminution.  Loss of height increased from 20 to 40% and new retropulsion of bone resulting in mild spinal stenosis with mild cord mass-effect but  no spinal cord signal abnormality.  Associated acute to subacute posterior left rib fractures 9 through 11.  Left paraspinal soft tissue edema from T9-T12.  Patient underwent thoracic T10-11 arthrodesis allograft morsels percutaneous pedicle screws with fixation 05/04/2020 per Dr. Christella Noa.  She continues with back brace as directed.  Her chronic Coumadin was initially held for surgical procedure has since been resumed.  Patient completed course of Keflex for E. coli UTI.  Pain management use of scheduled OxyContin.  Physical medicine rehabilitation consulted patient felt to be a candidate for inpatient rehab service due to decrease in functional mobility pain management.    Patient's medical [facility]  has been reviewed by the rehabilitation admission coordinator and  physician.   Past Medical History      Past Medical History:  Diagnosis Date  . Hypertension        Family History   family history includes Other in an other family member.   Prior Rehab/Hospitalizations Has the patient had prior rehab or hospitalizations prior to admission? Yes   Has the patient had major surgery during 100 days prior to admission? Yes              Current Medications   Current Facility-Administered Medications:  .  0.9 %  sodium chloride infusion, , Intravenous, PRN, Ashok Pall, MD, Stopped at 05/02/20 2103 .  0.9 %  sodium chloride infusion, 250 mL, Intravenous, Continuous, Cabbell, Kyle, MD .  acetaminophen (TYLENOL) tablet 1,000 mg, 1,000 mg, Oral, TID, Hongalgi, Anand D, MD, 1,000 mg at 05/13/20 0835 .  bisacodyl (DULCOLAX) EC tablet 5 mg, 5 mg, Oral, Daily PRN, Ashok Pall, MD, 5 mg at 05/02/20 0759 .  bisacodyl (DULCOLAX) suppository 10 mg, 10 mg, Rectal, Daily PRN, Sheikh, Omair Latif, DO .  diazepam (VALIUM) tablet 5 mg, 5 mg, Oral, Q6H PRN, Ashok Pall, MD, 5 mg at 05/13/20 0645 .  dorzolamide (TRUSOPT) 2 % ophthalmic solution 1 drop, 1 drop, Both Eyes, BID, Ashok Pall, MD, 1 drop at 05/13/20 0837 .  feeding supplement (ENSURE ENLIVE / ENSURE PLUS) liquid 237 mL, 237 mL, Oral, BID BM, Ashok Pall, MD, 237 mL at 05/13/20 0836 .  hydrALAZINE (APRESOLINE) injection 10 mg, 10 mg, Intravenous, Q6H PRN, Raiford Noble Latif, DO, 10 mg at 05/05/20 0238 .  hydrocortisone (ANUSOL-HC) 2.5 % rectal cream, , Rectal, BID, Raiford Noble [place], [place], Given at 05/13/20 [phone] .  latanoprost (XALATAN) 0.005 % ophthalmic solution 1 drop, 1 drop, Both Eyes, QHS, Cabbell, Kyle, MD, 1 drop at 05/12/20 2122 .  losartan (COZAAR) tablet 50 mg, 50 mg, Oral, Daily, Ashok Pall, MD, 50 mg at 05/13/20 0837 .  menthol-cetylpyridinium (CEPACOL) lozenge 3 mg, 1 lozenge, Oral, PRN **OR** phenol (CHLORASEPTIC) mouth spray 1 spray, 1 spray, Mouth/Throat, PRN, Ashok Pall, MD .   multivitamin with minerals tablet 1 tablet, 1 tablet, Oral, Daily, Raiford Noble [place], [place], 1 tablet at 05/13/20 [phone] .  ondansetron (ZOFRAN) tablet 4 mg, 4 mg, Oral, Q6H PRN **OR** ondansetron (ZOFRAN) injection 4 mg, 4 mg, Intravenous, Q6H PRN, Ashok Pall, MD .  polyethylene glycol (MIRALAX / GLYCOLAX) packet 17 g, 17 g, Oral, BID, Raiford Noble [place], DO, 17 g at 05/13/20 0835 .  rOPINIRole (REQUIP) tablet 0.25 mg, 0.25 mg, Oral, BID PRN, Ashok Pall, MD, 0.25 mg at 05/12/20 2121 .  senna-docusate (Senokot-S) tablet 1 tablet, 1 tablet, Oral, BID, Ashok Pall, MD, 1 tablet at 05/13/20 0835 .  sodium chloride flush (NS) 0.9 %  injection 3 mL, 3 mL, Intravenous, Q12H, Ashok Pall, MD, 3 mL at 05/13/20 0837 .  sodium chloride flush (NS) 0.9 % injection 3 mL, 3 mL, Intravenous, PRN, Ashok Pall, MD .  Derrill Memo ON 05/14/2020] warfarin (COUMADIN) tablet 1 mg, 1 mg, Oral, Q48H, Dang, Thuy D, RPH .  warfarin (COUMADIN) tablet 1.5 mg, 1.5 mg, Oral, Q48H, Dang, Thuy D, RPH .  Warfarin - Pharmacist Dosing Inpatient, , Does not apply, q1600, Henri Medal, [facility], Given at 05/12/20 1618 .  zolpidem (AMBIEN) tablet 5 mg, 5 mg, Oral, QHS PRN, Ashok Pall, MD, 5 mg at 05/12/20 2122   Patients Current Diet:     Diet Order                      Diet - low sodium heart healthy              Diet regular Room service appropriate? Yes; Fluid consistency: Thin  Diet effective now                      Precautions / Restrictions Precautions Precautions: Fall,Back Precaution Booklet Issued: Yes (comment) (unable to recall) Precaution Comments: LSO Spinal Brace: Lumbar corset,Applied in sitting position Restrictions Weight Bearing Restrictions: No    Has the patient had 2 or more falls or a fall with injury in the past year? Yes   Prior Activity Level Limited Community (1-2x/wk): Mod I ot supervision with RW   Prior Functional Level Self Care: Did the patient need help bathing, dressing, using  the toilet or eating? Needed some help   Indoor Mobility: Did the patient need assistance with walking from room to room (with or without device)? Needed some help   Stairs: Did the patient need assistance with internal or external stairs (with or without device)? Needed some help   Functional Cognition: Did the patient need help planning regular tasks such as shopping or remembering to take medications? Needed some help   Home Assistive Devices / [place] Devices/Equipment: Kasandra Knudsen (specify quad or straight),Grab bars around toilet,Grab bars in [place]: [place] - single point,Walker - 4 wheels   Prior Device Use: Indicate devices/aids used by the patient prior to current illness, exacerbation or injury? Walker rollator   Current Functional Level Cognition   Overall Cognitive Status: Impaired/Different from baseline Current Attention Level: Selective Orientation Level: Disoriented to time,Oriented to person,Oriented to place,Oriented to situation Following Commands: Follows one step commands with increased time (pt extremely HOH) Safety/Judgement: Decreased awareness of safety General Comments: pt impulsive and quick to move, needs cues for back precautions and is unable to don/doff brace without assist    Extremity Assessment (includes Sensation/Coordination)   Upper Extremity Assessment: Generalized weakness  Lower Extremity Assessment: Defer to PT evaluation     ADLs   Overall ADL's : Needs assistance/impaired Eating/Feeding: Set up,Sitting Grooming: Min guard,Standing Grooming Details (indicate cue type and reason): mod cues to wash hands and not just exit the bathroom Upper Body Bathing: Minimal assistance,Sitting Lower Body Bathing: Minimal assistance,Sit to/from stand Upper Body Dressing : Set up,Sitting Upper Body Dressing Details (indicate cue type and reason): Setup to don clean hospital gown Lower Body Dressing: Moderate  assistance,Sit to/from stand Lower Body Dressing Details (indicate cue type and reason): pt able to figure 4 cross R le but unable to do L LE with brief. pt needs cues to avoid bending over for back precautions Toilet Transfer: Environmental manager  Transfer Details (indicate cue type and reason): pt requires cues for hand placement Toileting- Clothing Manipulation and Hygiene: Minimal assistance Toileting - Clothing Manipulation Details (indicate cue type and reason): Mod A for mgmt of brief, pt hyperfocused on pain. Functional mobility during ADLs: Min guard,Rolling walker General ADL Comments: pt with poor recall of back precautiosn with adls. pt also HOH and needs visual redirection at times     Mobility   Overal bed mobility: Needs Assistance Bed Mobility: Rolling,Sit to Sidelying Rolling: Supervision Sidelying to sit: Min assist Sit to supine: Min assist Sit to sidelying: Min assist General bed mobility comments: pt impulsive, verbal cues to not reach behind self when return to supine, educated on log roll technique to adhere to back precautions     Transfers   Overall transfer level: Needs assistance Equipment used: Rolling walker (2 wheeled) Transfers: Sit to/from Stand Sit to Stand: Min guard Stand pivot transfers: Min guard General transfer comment: verbal cues for safe hand placement with poor carryover of cues; needs hand over hand assist for proper placement when sitting; x3 reps     Ambulation / Gait / Stairs / Wheelchair Mobility   Ambulation/Gait Ambulation/Gait assistance: Min guard,Min Web designer (Feet): 20 Feet (x3) Assistive device: Rolling walker (2 wheeled) Gait Pattern/deviations: Step-through pattern,Decreased stride length General Gait Details: deferred gait progression as pt tachycardic in standing (HR 115 bpm), pt performed pivot transfer and stepping at bedside ~20f with minA and RW Gait velocity: dec Gait velocity  interpretation: <1.31 ft/sec, indicative of household ambulator     Posture / Balance Dynamic Sitting Balance Sitting balance - Comments: no LOB but pt typically using BUE for support due to anxiety Balance Overall balance assessment: Needs assistance Sitting-balance support: Feet supported Sitting balance-Leahy Scale: Fair Sitting balance - Comments: no LOB but pt typically using BUE for support due to anxiety Standing balance support: Bilateral upper extremity supported Standing balance-Leahy Scale: Poor Standing balance comment: needs RW for support and external assist due to impulsivity     Special needs/care consideration Pain Management needs    Previous Home Environment  Living Arrangements: Alone  Lives With: Alone Available Help at Discharge: Family,Available PRN/intermittently Type of Home: [place] Two level,Able to live on main level with bedroom/bathroom Alternate Level Stairs-Rails: Right Alternate Level Stairs-Number of Steps: 13 Home Access: Stairs to enter ECenterPoint Energyof Steps: 1 Bathroom Shower/Tub: TEngineer, petroleum Handicapped height Bathroom Accessibility: Yes How Accessible: Accessible via walker [place] Yes [place] of Home Care Services[place]if [place] unknown Additional Comments: Commode downstairs in handicap height and one upstairs is not.   Discharge Living Setting Plans for Discharge Living Setting: Patient's home,Alone Type of Home at Discharge: House Discharge Home Layout: Two level,Able to live on main level with bedroom/bathroom Alternate Level Stairs-Rails: Right Alternate Level Stairs-Number of Steps: 13 Discharge Home Access: Stairs to enter Entrance Stairs-Rails: None Entrance Stairs-Number of Steps: 1 Discharge Bathroom Shower/Tub: Tub/shower unit Discharge Bathroom Toilet: Handicapped height Discharge Bathroom Accessibility: Yes How Accessible:  Accessible via walker Does the patient have any problems obtaining your medications?: No   Social/Family/Support Systems Contact Information: daughter, KCurt BearsAnticipated Caregiver: daughter prn Anticipated Caregiver's Contact Information: see above Caregiver Availability: Intermittent Discharge Plan Discussed with Primary Caregiver: Yes Is Caregiver In Agreement with Plan?: Yes Does Caregiver/Family have Issues with Lodging/Transportation while Pt is in Rehab?: No   Goals Patient/Family Goal for Rehab: Mod I to supervision with PT and  OT Expected length of stay: ELOS 10 to 14 days Pt/Family Agrees to Admission and willing to participate: Yes Program Orientation Provided & Reviewed with Pt/Caregiver Including Roles  & Responsibilities: Yes   Decrease burden of Care through IP rehab admission: n/a   Possible need for SNF placement upon discharge: not anticipated   Patient Condition: I have reviewed medical records from [facility] , spoken with  patient and daughter. I met with patient at the bedside for inpatient rehabilitation assessment.  Patient will benefit from ongoing PT and OT, can actively participate in 3 hours of therapy a day 5 days of the week, and can make measurable gains during the admission.  Patient will also benefit from the coordinated team approach during an Inpatient Acute Rehabilitation admission.  The patient will receive intensive therapy as well as Rehabilitation physician, nursing, social worker, and care management interventions.  Due to bladder management, bowel management, safety, skin/wound care, disease management, medication administration, pain management and patient education the patient requires 24 hour a day rehabilitation nursing.  The patient is currently min to mod assistoverall with mobility and basic ADLs.  Discharge setting and therapy post discharge at home with home health is anticipated.  Patient has agreed to participate in the Acute  Inpatient Rehabilitation Program and will admit today.   Preadmission Screen Completed By: Cleatrice Burke, 05/13/2020 12:54 PM ______________________________________________________________________   Discussed status with Dr. Dagoberto Ligas  on  05/13/2020 at 1255 and received approval for admission today.   Admission Coordinator:   Cleatrice Burke, RN, time  4166 Date 05/13/2020    Assessment/Plan: Diagnosis: 1. Does the need for close, 24 hr/day Medical supervision in concert with the patient's rehab needs make it unreasonable for this patient to be served in a less intensive setting? Yes 2. Co-Morbidities requiring supervision/potential complications: compression fx at T111; s/p arthrodesis T10-11 with fixation- HOH, L rib fx's L 9-11- Afib- on coumadin; HTN, RLS 3. Due to bladder management, bowel management, safety, skin/wound care, disease management, medication administration, pain management and patient education, does the patient require 24 hr/day rehab nursing? Yes 4. Does the patient require coordinated care of a physician, rehab nurse, PT, OT, and SLP to address physical and functional deficits in the context of the above medical diagnosis(es)? Yes Addressing deficits in the following areas: balance, endurance, locomotion, strength, transferring, bathing, dressing, feeding, grooming and toileting 5. Can the patient actively participate in an intensive therapy program of at least 3 hrs of therapy 5 days a week? Yes 6. The potential for patient to make measurable gains while on inpatient rehab is good and fair 7. Anticipated functional outcomes upon discharge from inpatient rehab: modified independent and supervision PT, modified independent and supervision OT, n/a SLP 8. Estimated rehab length of stay to reach the above functional goals is: 10-14 days 9. Anticipated discharge destination: Home 10. Overall Rehab/Functional Prognosis: good and fair     MD Signature:             Revision History                                                      Note Details  Jan Fireman, MD File Time 05/13/2020 12:59 PM  Author Type Physician Status Signed  Last Editor Danielle Heys, MD Service Physical Medicine and Rehabilitation

## 2020-05-13 NOTE — H&P (Signed)
Physical Medicine and Rehabilitation Admission H&P    No chief complaint on file. : HPI: Danielle Thomas is a 87 year old right-handed female with Danielle Hornshistory of atrial fibrillation maintained on Coumadin, hypertension, restless leg syndrome.  Per chart review she lives alone.  Two-level home bed and bath main level one-step to entry.  Uses a Rollator for mobility.  Patient was still driving grocery shopping prior to admission.  She has family that check on her routinely.  Patient with recent admission 03/27/2020 to 03/29/2020 for multiple electrolyte abnormalities.  She was found to have T11 compression fracture which was managed with pain medication and a TLSO back brace.  She was discharged to skilled nursing facility at that time.  She was in a skilled nursing facility for 2 weeks before returning home and has essentially been bedbound with difficulty in ambulation.  Presented 05/01/2020 with increasing back pain.  Admission chemistry sodium 134 potassium 2.9 glucose 130 BUN 22 creatinine 0.90, WBC 11,300 hemoglobin 16.9, urine culture greater 100,000 E. coli.  MRI thoracic lumbar spine showed progressed T11 compression fracture since last month increased comminution.  Loss of height increased from 20 to 40% and new retropulsion of bone resulting in mild spinal stenosis with mild cord mass-effect but no spinal cord signal abnormality.  Associated acute to subacute posterior left rib fractures 9 through 11.  Left paraspinal soft tissue edema from T9-T12.  Patient underwent thoracic T10-11 arthrodesis allograft morsels percutaneous pedicle screws with fixation 05/04/2020 per Dr. Franky Machoabbell.  She continues with back brace as directed.  Her chronic Coumadin was initially held for surgical procedure has since been resumed.  Patient completed course of Keflex for E. coli UTI.  Pain management use of scheduled OxyContin.  Physical medicine rehabilitation consulted patient felt to be a candidate for inpatient rehab  service due to decrease in functional mobility pain management.  She was admitted for a comprehensive rehab program.  She complains she's not receiving Requip correctly and hasn't had a good BM in 4+ days- asked for help.  Not eating much- no appetite- likely made worse by constipation.  Voiding OK.   Review of Systems  Constitutional: Negative for chills and fever.  HENT: Negative for hearing loss.   Eyes: Negative for blurred vision and double vision.  Respiratory: Negative for cough and shortness of breath.   Cardiovascular: Positive for palpitations and leg swelling. Negative for chest pain.  Gastrointestinal: Positive for constipation. Negative for heartburn, nausea and vomiting.  Genitourinary: Negative for dysuria, flank pain and hematuria.  Musculoskeletal: Positive for back pain, joint pain and myalgias.  Skin: Negative for rash.  Neurological:       Restless leg syndrome  Psychiatric/Behavioral: The patient has insomnia.   All other systems reviewed and are negative.  Past Medical History:  Diagnosis Date  . Hypertension    Past Surgical History:  Procedure Laterality Date  . KNEE SURGERY    . LUMBAR PERCUTANEOUS PEDICLE SCREW 2 LEVEL N/A 05/04/2020   Procedure: Thoracic nine to Thoracic twelve percutaneous pedicle screws;  Surgeon: Coletta Memosabbell, Kyle, MD;  [facility];  Service: Neurosurgery;  Laterality: N/A;  . LUNG BIOPSY    . PARTIAL HYSTERECTOMY    . TONSILLECTOMY AND ADENOIDECTOMY     Family History  Problem Relation Age of Onset  . Other Other        NO HEALTH ISSUES   Social History:  reports that she has never smoked. She has never used smokeless tobacco. No history on  file for alcohol use and drug use. Allergies:  Allergies  Allergen Reactions  . Amlodipine Besylate     Other reaction(s): high dose make feet swell  . Codeine Nausea And Vomiting  . Meperidine Nausea Only  . Metoprolol Succinate [Metoprolol]     Other reaction(s): effects her  breathing  . Oxycodone Nausea And Vomiting   Medications Prior to Admission  Medication Sig Dispense Refill  . acetaminophen (TYLENOL) 500 MG tablet Take 1 tablet (500 mg total) by mouth 3 (three) times daily. For acute pain for 3 to 5 days and then consider changing to as needed.    . bisacodyl (DULCOLAX) 5 MG EC tablet Take 1 tablet (5 mg total) by mouth daily as needed for moderate constipation.    . dorzolamide (TRUSOPT) 2 % ophthalmic solution Place 1 drop into both eyes 2 (two) times daily.     . feeding supplement (ENSURE ENLIVE / ENSURE PLUS) LIQD Take 237 mLs by mouth 2 (two) times daily between meals.    Marland Kitchen losartan (COZAAR) 50 MG tablet Take 50 mg by mouth daily.    . methocarbamol (ROBAXIN) 500 MG tablet Take 250 mg by mouth daily as needed for muscle spasms.    Derrill Memo ON 05/14/2020] Multiple Vitamin (MULTIVITAMIN WITH MINERALS) TABS tablet Take 1 tablet by mouth daily.    Marland Kitchen oxyCODONE-acetaminophen (PERCOCET/ROXICET) 5-325 MG tablet Take 1 tablet by mouth every 6 (six) hours as needed for moderate pain or severe pain.    . polyethylene glycol (MIRALAX / GLYCOLAX) 17 g packet Take 17 g by mouth 2 (two) times daily.    Marland Kitchen rOPINIRole (REQUIP) 0.25 MG tablet Take 0.25 mg by mouth 2 (two) times daily as needed (restless legs). Restless leg    . senna-docusate (SENOKOT-S) 8.6-50 MG tablet Take 1 tablet by mouth 2 (two) times daily.    . travoprost, benzalkonium, (TRAVATAN) 0.004 % ophthalmic solution Place 1 drop into both eyes at bedtime.     Marland Kitchen warfarin (COUMADIN) 1 MG tablet Take 1 mg by mouth every other day.    . warfarin (COUMADIN) 3 MG tablet Take 1.5 mg by mouth every other day.      Drug Regimen Review Drug regimen was reviewed and remains appropriate with no significant issues identified  Home:     Functional History:    Functional Status:  Mobility:          ADL:    Cognition: Cognition Orientation Level: Oriented X4    Physical Exam: Blood pressure (!)  147/83, pulse 78, temperature 98.1 F (36.7 C), resp. rate 20, height 5\' 3"  (1.6 m), weight 75.5 kg, SpO2 94 %. Physical Exam Vitals and nursing note reviewed.  Constitutional:      Appearance: Normal appearance.     Comments: Older female constantly moving due to badly controlled RLS, appropriate, Ox3, NAD Ate <25% of her lunch  HENT:     Head: Normocephalic and atraumatic.     Comments: Smile equal    Right Ear: External ear normal.     Left Ear: External ear normal.     Nose: Nose normal. No congestion.     Mouth/Throat:     Mouth: Mucous membranes are dry.     Pharynx: Oropharynx is clear. No oropharyngeal exudate.  Eyes:     General:        Right eye: No discharge.        Left eye: No discharge.     Extraocular Movements: Extraocular movements  intact.     Conjunctiva/sclera: Conjunctivae normal.  Cardiovascular:     Heart sounds: Normal heart sounds.     Comments: Irregularly irregular- rate controlled Pulmonary:     Comments: CTA B/L- no W/R/R- good air movement Abdominal:     Comments: Soft, but slightly distended; hypoactive  Musculoskeletal:     Cervical back: Normal range of motion. No rigidity.     Comments: UEs 5/5 in deltoids, biceps, triceps, WE grip and finger abd B/L LEs- 4+/5 HF, 5-/5 in KE, DF and PF B/L Moving pretty well/constnatly due to RLS  Skin:    Comments: Back incision is dressed clean and dry. Has very large purple bruising across entire upper/mid and lower back- surrounding thoracic incision with drain- healing well and lower lumbar incision/scar  Neurological:     Mental Status: She is alert.     Comments: Patient is alert in no acute distress oriented x3 and follows commands. Ox3- intact to light touch in all 4 extremities   Psychiatric:        Mood and Affect: Mood normal.        Behavior: Behavior normal.     Results for orders placed or performed during the hospital encounter of 05/01/20 (from the past 48 hour(s))  Protime-INR      Status: Abnormal   Collection Time: 05/12/20  2:08 AM  Result Value Ref Range   Prothrombin Time 25.9 (H) 11.4 - 15.2 seconds   INR 2.5 (H) 0.8 - 1.2    Comment: (NOTE) INR goal varies based on device and disease states. Performed at [facility], [place] [address] [place], [place] [address]   CBC with Differential/Platelet     Status: Abnormal   Collection Time: 05/12/20  2:08 AM  Result Value Ref Range   WBC 8.6 4.0 - 10.5 K/uL   RBC 3.83 (L) 3.87 - 5.11 MIL/uL   Hemoglobin 11.4 (L) 12.0 - 15.0 g/dL   HCT 35.3 (L) 36.0 - 46.0 %   MCV 92.2 80.0 - 100.0 fL   MCH 29.8 26.0 - 34.0 pg   MCHC 32.3 30.0 - 36.0 g/dL   RDW 16.1 (H) 11.5 - 15.5 %   Platelets 217 150 - 400 K/uL   nRBC 0.0 0.0 - 0.2 %   Neutrophils Relative % 76 %   Neutro Abs 6.6 1.7 - 7.7 K/uL   Lymphocytes Relative 9 %   Lymphs Abs 0.8 0.7 - 4.0 K/uL   Monocytes Relative 10 %   Monocytes Absolute 0.8 0.1 - 1.0 K/uL   Eosinophils Relative 4 %   Eosinophils Absolute 0.3 0.0 - 0.5 K/uL   Basophils Relative 0 %   Basophils Absolute 0.0 0.0 - 0.1 K/uL   Immature Granulocytes 1 %   Abs Immature Granulocytes 0.08 (H) 0.00 - 0.07 K/uL    Comment: Performed at [facility] [address], [place], [place] [address]  Comprehensive metabolic panel     Status: Abnormal   Collection Time: 05/12/20  2:08 AM  Result Value Ref Range   Sodium 134 (L) 135 - 145 mmol/L   Potassium 4.1 3.5 - 5.1 mmol/L   Chloride 101 98 - 111 mmol/L   CO2 24 22 - 32 mmol/L   Glucose, Bld 109 (H) 70 - 99 mg/dL    Comment: Glucose reference range applies only to samples taken after fasting for at least 8 hours.   BUN 17 8 - 23 mg/dL   Creatinine, Ser 0.82  0.44 - 1.00 mg/dL   Calcium 8.0 (L) 8.9 - 10.3 mg/dL   Total Protein 4.6 (L) 6.5 - 8.1 g/dL   Albumin 2.3 (L) 3.5 - 5.0 g/dL   AST 23 15 - 41 U/L   ALT 26 0 - 44 U/L   Alkaline Phosphatase 52 38 - 126 U/L   Total Bilirubin 0.9 0.3 - 1.2 mg/dL   GFR, Estimated >60 >60 mL/min     Comment: (NOTE) Calculated using the CKD-EPI Creatinine Equation (2021)    Anion gap 9 5 - 15    Comment: Performed at [facility] [address], [place], [place] [address]  Phosphorus     Status: None   Collection Time: 05/12/20  2:08 AM  Result Value Ref Range   Phosphorus 3.5 2.5 - 4.6 mg/dL    Comment: Performed at [facility] [address] [place], [place] [address]  Magnesium     Status: None   Collection Time: 05/12/20  2:08 AM  Result Value Ref Range   Magnesium 1.9 1.7 - 2.4 mg/dL    Comment: Performed at [facility] [address], [place], [place] [address]  Protime-INR     Status: Abnormal   Collection Time: 05/13/20  1:50 AM  Result Value Ref Range   Prothrombin Time 26.6 (H) 11.4 - 15.2 seconds   INR 2.6 (H) 0.8 - 1.2    Comment: (NOTE) INR goal varies based on device and disease states. Performed at [facility], [place] [address], [place], [place] [address]   CBC     Status: Abnormal   Collection Time: 05/13/20  1:50 AM  Result Value Ref Range   WBC 8.7 4.0 - 10.5 K/uL   RBC 3.79 (L) 3.87 - 5.11 MIL/uL   Hemoglobin 11.2 (L) 12.0 - 15.0 g/dL   HCT 35.3 (L) 36.0 - 46.0 %   MCV 93.1 80.0 - 100.0 fL   MCH 29.6 26.0 - 34.0 pg   MCHC 31.7 30.0 - 36.0 g/dL   RDW 16.2 (H) 11.5 - 15.5 %   Platelets 219 150 - 400 K/uL   nRBC 0.0 0.0 - 0.2 %    Comment: Performed at [facility], [place] [address], [place], [place] [address]   No results found.     Medical Problem List and Plan: 1.  Decreased functional mobility secondary to thoracic T11 compression fracture.  Status post T10-11 arthrodesis percutaneous pedicle screw 05/04/2020.  Back brace as directed  -patient may  Shower- be careful with back brace  -ELOS/Goals:  10-14 days- mod I 2.  Antithrombotics: -DVT/anticoagulation: Chronic Coumadin  -antiplatelet therapy: N/A 3. Pain Management: OxyContin 10 mg every 12 hours, Percocet 5/325 mg q4 hours prn, Valium as needed muscle  spasms.  Monitor mental status esp due to her age. Will verify meds with pharmacy which I did since some meds were changed. .  4. Mood: Provide emotional support  -antipsychotic agents: N/A 5. Neuropsych: This patient is capable of making decisions on her own behalf. 6. Skin/Wound Care: Routine skin checks 7. Fluids/Electrolytes/Nutrition: Routine in and outs with follow-up chemistries 8.  E. coli UTI.  Completed course of Keflex 9.  Hypertension.  Cozaar 50 mg daily.  Monitor with increased mobility 10.  Atrial fibrillation.  Coumadin as directed.  Cardiac rate controlled 11.  Restless leg syndrome.  Requip 0.25 mg in AM prn and 0.5 mg every evening at 6pm- that was her home dose  12.  Constipation.  Senokot-S twice daily, MiraLAX twice daily, Dulcolax tablet/ suppository daily as needed- will add Sorbitol x1 today since she says no decent BM in 4+ days- (only 1 tiny pebble last 3x).  13. Poor appetite- could be poor due to constipation will monitor-   Earlean Polka, PA-C  I have personally performed a face to face diagnostic evaluation of this patient and formulated the key components of the plan.  Additionally, I have personally reviewed laboratory data, imaging studies, as well as relevant notes and concur with the physician assistant's documentation above.   The patient's status has not changed from the original H&P.  Any changes in documentation from the acute care chart have been noted above.    Genice Rouge, MD 05/13/2020

## 2020-05-13 NOTE — Progress Notes (Addendum)
Inpatient Rehabilitation Admissions Coordinator  CIR bed is available to admit patient today. I spoke with her daughter, Benjamine Mola by phone and she is in agreement to CIR admit preferred rather than SNF. I have alerted acute team , TOC and Dr. Algis Liming. Dr will let m know by 1 pm if ok to d/c to CIR today. I met with patient at bedside and she is in agreement.  Danne Baxter, RN, MSN Rehab Admissions Coordinator [phone] 05/13/2020 10:31 AM

## 2020-05-13 NOTE — Progress Notes (Signed)
Pt arrived to 4W17 per bed. Discussed plan of care, reviewed histories and medications and rehab policies. No further questions. Vitals obtained  Mylo Red, LPN

## 2020-05-14 ENCOUNTER — Inpatient Hospital Stay (HOSPITAL_COMMUNITY): Payer: Medicare HMO

## 2020-05-14 DIAGNOSIS — S22000A Wedge compression fracture of unspecified thoracic vertebra, initial encounter for closed fracture: Secondary | ICD-10-CM | POA: Diagnosis not present

## 2020-05-14 LAB — CBC WITH DIFFERENTIAL/PLATELET
Abs Immature Granulocytes: 0.07 10*3/uL (ref 0.00–0.07)
Basophils Absolute: 0 10*3/uL (ref 0.0–0.1)
Basophils Relative: 1 %
Eosinophils Absolute: 0.3 10*3/uL (ref 0.0–0.5)
Eosinophils Relative: 3 %
HCT: 36.3 % (ref 36.0–46.0)
Hemoglobin: 11.8 g/dL — ABNORMAL LOW (ref 12.0–15.0)
Immature Granulocytes: 1 %
Lymphocytes Relative: 11 %
Lymphs Abs: 0.9 10*3/uL (ref 0.7–4.0)
MCH: 29.8 pg (ref 26.0–34.0)
MCHC: 32.5 g/dL (ref 30.0–36.0)
MCV: 91.7 fL (ref 80.0–100.0)
Monocytes Absolute: 0.6 10*3/uL (ref 0.1–1.0)
Monocytes Relative: 8 %
Neutro Abs: 6.4 10*3/uL (ref 1.7–7.7)
Neutrophils Relative %: 76 %
Platelets: 239 10*3/uL (ref 150–400)
RBC: 3.96 MIL/uL (ref 3.87–5.11)
RDW: 16.3 % — ABNORMAL HIGH (ref 11.5–15.5)
WBC: 8.2 10*3/uL (ref 4.0–10.5)
nRBC: 0 % (ref 0.0–0.2)

## 2020-05-14 LAB — COMPREHENSIVE METABOLIC PANEL
ALT: 24 U/L (ref 0–44)
AST: 22 U/L (ref 15–41)
Albumin: 2.5 g/dL — ABNORMAL LOW (ref 3.5–5.0)
Alkaline Phosphatase: 58 U/L (ref 38–126)
Anion gap: 8 (ref 5–15)
BUN: 11 mg/dL (ref 8–23)
CO2: 23 mmol/L (ref 22–32)
Calcium: 8.1 mg/dL — ABNORMAL LOW (ref 8.9–10.3)
Chloride: 103 mmol/L (ref 98–111)
Creatinine, Ser: 0.69 mg/dL (ref 0.44–1.00)
GFR, Estimated: >60 mL/min (ref >60)
Glucose, Bld: 103 mg/dL — ABNORMAL HIGH (ref 70–99)
Potassium: 4 mmol/L (ref 3.5–5.1)
Sodium: 134 mmol/L — ABNORMAL LOW (ref 135–145)
Total Bilirubin: 1.2 mg/dL (ref 0.3–1.2)
Total Protein: 5.2 g/dL — ABNORMAL LOW (ref 6.5–8.1)

## 2020-05-14 LAB — PROTIME-INR
INR: 2.1 — ABNORMAL HIGH (ref 0.8–1.2)
Prothrombin Time: 22.9 seconds — ABNORMAL HIGH (ref 11.4–15.2)

## 2020-05-14 MED ORDER — SORBITOL 70 % SOLN
60.0000 mL | Freq: Once | Status: AC
  Administered 2020-05-14: 60 mL via ORAL
  Filled 2020-05-14: qty 60

## 2020-05-14 NOTE — Progress Notes (Signed)
[facility] PHYSICAL MEDICINE & REHABILITATION PROGRESS NOTE   Subjective/Complaints:  Pt reports slept much better last night due to improved Restless leg syndrome Sx's (increased her Requip dose).   Had a little BM overnight, but not much with BM.   Asking for pain meds.  Says she's thinks she's due and pain is worse again.    ROS:  Pt denies SOB, abd pain, CP, N/V/C/D, and vision changes   Objective:   No results found. Recent Labs    05/13/20 0150 05/14/20 0519  WBC 8.7 8.2  HGB 11.2* 11.8*  HCT 35.3* 36.3  PLT 219 239   Recent Labs    05/12/20 0208 05/14/20 0519  NA 134* 134*  K 4.1 4.0  CL 101 103  CO2 24 23  GLUCOSE 109* 103*  BUN 17 11  CREATININE 0.82 0.69  CALCIUM 8.0* 8.1*    Intake/Output Summary (Last 24 hours) at 05/14/2020 1313 Last data filed at 05/14/2020 0820 Gross per 24 hour  Intake 240 ml  Output --  Net 240 ml        Physical Exam: Vital Signs Blood pressure 139/72, pulse 92, temperature 97.7 F (36.5 C), resp. rate 18, height 5\' 3"  (1.6 m), weight 72.5 kg, SpO2 96 %.   Constitutional:  laying on side in bed; appropriate, less leg movement/not constant, HOH, NAD HENT: conjugate gaze; hair loss noted.  Cardiovascular: irregular irregular, rate controlled Pulmonary: CTA B/L- no W/R/R- good air movement Abdominal: Soft, NT, somewhat distended, hypoactive BS Musculoskeletal: TTP over middle back    Comments: UEs 5/5 in deltoids, biceps, triceps, WE grip and finger abd B/L LEs- 4+/5 HF, 5-/5 in KE, DF and PF B/L  Skin:Has very large purple bruising across entire upper/mid and lower back-no change surrounding thoracic incision with drain scar- not draining - healing well and lower lumbar incision/scar  Neurological: Ox3 Psychiatric:   appropriate      Assessment/Plan: 1. Functional deficits which require 3+ hours per day of interdisciplinary therapy in a comprehensive inpatient rehab setting.  Physiatrist is providing close  team supervision and 24 hour management of active medical problems listed below.  Physiatrist and rehab team continue to assess barriers to discharge/monitor patient progress toward functional and medical goals  Care Tool:  Bathing    Body parts bathed by patient: Right arm,Left arm,Chest,Abdomen,Front perineal area,Right upper leg,Left upper leg,Face   Body parts bathed by helper: Buttocks,Left lower leg,Right lower leg     Bathing assist Assist Level: Moderate Assistance - Patient 50 - 74%     Upper Body Dressing/Undressing Upper body dressing   What is the patient wearing?: Pull over shirt    Upper body assist Assist Level: Minimal Assistance - Patient > 75%    Lower Body Dressing/Undressing Lower body dressing      What is the patient wearing?: Underwear/pull up,Pants,Incontinence brief     Lower body assist Assist for lower body dressing: Maximal Assistance - Patient 25 - 49%     Toileting Toileting    Toileting assist Assist for toileting: 2 Helpers     Transfers Chair/bed transfer  Transfers assist     Chair/bed transfer assist level: Minimal Assistance - Patient > 75%     Locomotion Ambulation   Ambulation assist              Walk 10 feet activity   Assist           Walk 50 feet activity   Assist  Walk 150 feet activity   Assist           Walk 10 feet on uneven surface  activity   Assist           Wheelchair     Assist               Wheelchair 50 feet with 2 turns activity    Assist            Wheelchair 150 feet activity     Assist          Blood pressure 139/72, pulse 92, temperature 97.7 F (36.5 C), resp. rate 18, height 5\' 3"  (1.6 m), weight 72.5 kg, SpO2 96 %.  Medical Problem List and Plan: 1.  Decreased functional mobility secondary to thoracic T11 compression fracture.  Status post T10-11 arthrodesis percutaneous pedicle screw 05/04/2020.  Back brace as  directed             -patient may  Shower- be careful with back brace             -ELOS/Goals:  10-14 days- mod I 2.  Antithrombotics: -DVT/anticoagulation: Chronic Coumadin 1/7- INR per pharmacy             -antiplatelet therapy: N/A 3. Pain Management: OxyContin 10 mg every 12 hours, Percocet 5/325 mg q4 hours prn, Valium as needed muscle spasms.  Monitor mental status esp due to her age. Will verify meds with pharmacy which I did since some meds were changed.   1/7- changed pain meds yesterday- pain doing better- con't regimen  4. Mood: Provide emotional support             -antipsychotic agents: N/A 5. Neuropsych: This patient is capable of making decisions on her own behalf. 6. Skin/Wound Care: Routine skin checks 7. Fluids/Electrolytes/Nutrition: Routine in and outs with follow-up chemistries 8.  E. coli UTI.  Completed course of Keflex 9.  Hypertension.  Cozaar 50 mg daily.  Monitor with increased mobility  1/7- BP controlled- con't regimen 10.  Atrial fibrillation.  Coumadin as directed.  Cardiac rate controlled  1/7- rate controlled- doing well- con't regimen 11.  Restless leg syndrome.  Requip 0.25 mg in AM prn and 0.5 mg every evening at 6pm- that was her home dose  1/7- restless leg syndrome doing better with changes- con't regimen 12.  Constipation.  Senokot-S twice daily, MiraLAX twice daily, Dulcolax tablet/ suppository daily as needed- will add Sorbitol x1 today - (only 1 tiny pebble last 3x).   1/7- will give another dose of Sorbitol 60 G this afternoon and if doesn't go, needs mg citrate over weekend- on a lot of bowel meds- but needs ot have BM- has been 5 days.  13. Poor appetite- could be poor due to constipation will monitor-     LOS: 1 days A FACE TO FACE EVALUATION WAS PERFORMED  Krupa Stege 05/14/2020, 1:13 PM

## 2020-05-14 NOTE — Progress Notes (Signed)
Occupational Therapy Session Note  Patient Details  Name: Danielle Thomas MRN: [id number] Date of Birth: Apr 15, 1928  Today's Date: 05/14/2020 OT Individual Time: 1448-1856 OT Individual Time Calculation (min): 70 min    Short Term Goals: Week 1:  OT Short Term Goal 1 (Week 1): Pt will complete LB dressing with min A and use of AE, prn. OT Short Term Goal 2 (Week 1): Pt will complete toileting task with min A OT Short Term Goal 3 (Week 1): Pt will complete bathing with min A OT Short Term Goal 4 (Week 1): Pt will engage in functional ADL/IADL in standing x2 minutes to increase endurance required for self-care tasks.  Skilled Therapeutic Interventions/Progress Updates:  Pt sitting in recliner upon arrival with weight shifted to her L. Pt states that she is "very uncomfortable" and would like to stand because that relieves her pain. Sit<>stand with CGA. Functional amb with RW with CGA. Pt transitioned to gym. Amb with RW 30' with CGA before asking for rest break. Standing at [facility] for 1 minX3 and requested rest break. Pt transitioned to Day Room. Amb with RW 40' with rest break. Pt c/o being lightheaded. BP sitting 144/90; standing-119/95 with "a little" lightheadedness. Pt amb with RW towards room and amb approx 40' before requesting rest break. Pt commented that MD told her the surgery would help her pain but pt states that she can't tell a difference. Pt somewhat despondent about current situation and she is not sure if she can live by herself. Pt returned to room and requested to return to bed. Sit>supine with CGA. Pt remained in bed with all needs within reach and bed alarm activated.  Therapy Documentation Precautions:  Precautions Precautions: Back Spinal Brace: Thoracolumbosacral orthotic,Applied in sitting position Restrictions Weight Bearing Restrictions: No  Pain: Pain Assessment Pain Scale: 0-10 Pain Score: 7  Pain Location: Back  repositioned   Therapy/Group: Individual  Therapy  Leroy Libman 05/14/2020, 12:15 PM

## 2020-05-14 NOTE — Progress Notes (Signed)
Initial Nutrition Assessment  RD working remotely.  DOCUMENTATION CODES:   Not applicable  INTERVENTION:   - Downgrade diet to dysphagia 3 for ease of intake  - Continue Ensure Enlive po BID, each supplement provides 350 kcal and 20 grams of protein  - Add Magic Cup TID with meals, each supplement provides 290 kcal and 9 grams of protein  - Encourage adequate PO intake  - Continue MVI with minerals daily  NUTRITION DIAGNOSIS:   Inadequate oral intake related to poor appetite as evidenced by per patient/family report,meal completion < 50%.  GOAL:   Patient will meet greater than or equal to 90% of their needs  MONITOR:   PO intake,Supplement acceptance,Labs,Weight trends,Skin,I & O's  REASON FOR ASSESSMENT:   Malnutrition Screening Tool    ASSESSMENT:   87 year old female with PMH of atrial fibrillation, HTN, restless leg syndrome. Pt with recent admission 03/27/20 for multiple electrolyte abnormalities. Pt was found to have T11 compression fracture which was managed with pain medication and a TLSO back brace. Pt was discharged to SNF at that time and was there for 2 weeks before returning home and has essentially been bedbound with difficulty ambulating. Presented 12/25 with increasing back pain. MRI thoracic lumbar spine showed progressed T11 compression fracture. Associated acute to subacute posterior left rib fractures 9 through 11. Pt underwent thoracic T10-11 arthrodesis allograft morsels percutaneous pedicle screws with fixation on 12/28. Admitted to CIR on 05/13/20.   Spoke with pt briefly via phone call to room. Phone call was kept short as pt had difficulty hearing and understanding RD. RD will follow up with pt in person to obtain additional diet and weight history.  Pt reports having a poor appetite. She states that she "just can't eat." She requests smaller portions on her meal trays. Pt endorses drinking Ensure supplement and states that she enjoys sweets. RD to  order Magic Cups with meals to aid pt in meeting kcal and protein needs.  Pt reports that she has difficulty chewing the food. She does not want "mush" but is willing to try dysphagia 3 diet to see if that helps with intake. RD will downgrade diet to dysphagia 3 for ease of intake.  Per RN edema assessment, pt with non-pitting edema to BUE. Suspect current weight is falsely elevated related to edema.  Reviewed weight history in chart. Weight was 72.7 kg on 03/27/20 then up to 76.9 kg on 05/11/20 and 75.5 kg on 1/06. Weight this morning back down to 72.5 kg. Difficult to determine dry weight at this time. Suspect pt with some weight loss given poor appetite and poor PO intake but unable to identify how much due to weight inconsistencies.  Meal Completion: 25-35%  Medications reviewed and include: Ensure Enlive BID, MVI with minerals daily, miralax, senna, warfarin  Labs reviewed: sodium 134  NUTRITION - FOCUSED PHYSICAL EXAM:  Unable to complete at this time. RD working remotely.  Diet Order:   Diet Order            DIET DYS 3 Room service appropriate? Yes; Fluid consistency: Thin  Diet effective now                 EDUCATION NEEDS:   Not appropriate for education at this time  Skin:  Skin Assessment: Skin Integrity Issues: Incisions: back Other: MASD to coccyx  Last BM:  05/13/20  Height:   Ht Readings from Last 1 Encounters:  05/13/20 5\' 3"  (1.6 m)    Weight:  Wt Readings from Last 1 Encounters:  05/14/20 72.5 kg    BMI:  Body mass index is 28.31 kg/m.  Estimated Nutritional Needs:   Kcal:  1600-1800  Protein:  80-95 grams  Fluid:  >/= 1.6 L    Gustavus Bryant, MS, RD, LDN Inpatient Clinical Dietitian Please see AMiON for contact information.

## 2020-05-14 NOTE — Progress Notes (Signed)
Inpatient Rehabilitation Care Coordinator Assessment and Plan Patient Details  Name: Danielle Thomas MRN: [id number] Date of Birth: 29-Apr-1928  Today's Date: 05/14/2020  Hospital Problems: Principal Problem:   Compression fracture of body of thoracic vertebra [facility])  Past Medical History:  Past Medical History:  Diagnosis Date  . Hypertension    Past Surgical History:  Past Surgical History:  Procedure Laterality Date  . KNEE SURGERY    . LUMBAR PERCUTANEOUS PEDICLE SCREW 2 LEVEL N/A 05/04/2020   Procedure: Thoracic nine to Thoracic twelve percutaneous pedicle screws;  Surgeon: Danielle Pall, MD;  Location: [place];  Service: Neurosurgery;  Laterality: N/A;  . LUNG BIOPSY    . PARTIAL HYSTERECTOMY    . TONSILLECTOMY AND ADENOIDECTOMY     Social History:  reports that she has never smoked. She has never used smokeless tobacco. No history on file for alcohol use and drug use.  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 Education: some college Read: Yes Write: Yes Employment Status: Retired Date Retired/Disabled/Unemployed: 2016 Age Retired: 5 Public relations account executive Issues: Denies Guardian/Conservator: N/A   Abuse/Neglect Abuse/Neglect Assessment Can Be Completed: Yes Physical Abuse: Denies Verbal Abuse: Denies Sexual Abuse: Denies Exploitation of patient/patient's resources: Denies Self-Neglect: Denies  Emotional Status Pt's affect, behavior and adjustment  status: Pt in pain during session, and had trouble concentrating Recent Psychosocial Issues: Denies Psychiatric History: Denies Substance Abuse History: Rare- EtOH use  Patient / Family Perceptions, Expectations & Goals Pt/Family understanding of illness & functional limitations: Pt and family have general understanding of pt care needs Premorbid pt/family roles/activities: Independent Anticipated changes in roles/activities/participation: Assistance with ADLs/IADLs Pt/family expectations/goals: Pt goal is to be independent and stop the pain  [employer]: None Premorbid Home Care/DME Agencies: None Transportation available at discharge: family Resource referrals recommended: Neuropsychology  Discharge Planning Living Arrangements: Alone Support Systems: Spouse/significant other Type of Residence: Private residence Insurance Resources: Multimedia programmer (specify) ([place]) Financial Resources: Social Security Financial Screen Referred: No Living Expenses: Own Money Management: Patient Does the patient have any problems obtaining your medications?: No Care Coordinator Barriers to Discharge: Decreased caregiver support,Lack of/limited family support Care Coordinator Anticipated Follow Up Needs: HH/OP  Clinical Impression SW met with pt in room to introduce self, explain role, and discuss discharge process. 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 Pt dtr Danielle Thomas to assist with bills until she is able too resume. DME: RW, BSC, and be downstairs. During assessment, pt dtr Danielle Thomas called. SW spoke with Danielle Thomas to introduce self. Confirms pt will only have intermittent support at d/c due to work schedules.  SW explained will follow-up with updates on goals and d/c date after team conference on Tuesday. SW met pt dtr Danielle Thomas while leaving room.   *Per EMR pt went to [facility] healthcare-SNF and did not like. Pt confirmed this during assessment.  Danielle Thomas 05/14/2020, 4:47  PM

## 2020-05-14 NOTE — Evaluation (Signed)
Physical Therapy Assessment and Plan  Patient Details  Name: Danielle Thomas MRN: [id number] Date of Birth: Feb 03, 1928  PT Diagnosis: Abnormal posture, Abnormality of gait, Muscle weakness and Pain in back. Rehab Potential: Good ELOS: 12-14 days   Today's Date: 05/14/2020 PT Individual Time: 1417-1530 PT Individual Time Calculation (min): 73 min    Hospital Problem: Principal Problem:   Compression fracture of body of thoracic vertebra [facility])   Past Medical History:  Past Medical History:  Diagnosis Date  . Hypertension    Past Surgical History:  Past Surgical History:  Procedure Laterality Date  . KNEE SURGERY    . LUMBAR PERCUTANEOUS PEDICLE SCREW 2 LEVEL N/A 05/04/2020   Procedure: Thoracic nine to Thoracic twelve percutaneous pedicle screws;  Surgeon: Ashok Pall, MD;  Location: [place];  Service: Neurosurgery;  Laterality: N/A;  . LUNG BIOPSY    . PARTIAL HYSTERECTOMY    . TONSILLECTOMY AND ADENOIDECTOMY      Assessment & Plan Clinical Impression: Danielle Thomas is a 87 year old right-handed female with history of atrial fibrillation maintained on Coumadin, hypertension, restless leg syndrome. Per chart review she lives alone. Two-level home bed and bath main level one-step to entry. Uses a Rollator for mobility. Patient was still driving grocery shopping prior to admission. She has family that check on her routinely. Patient with recent admission 03/27/2020 to 03/29/2020 for multiple electrolyte abnormalities. She was found to have T11 compression fracture which was managed with pain medication and a TLSO back brace. She was discharged to skilled nursing facility at that time. She was in a skilled nursing facility for 2 weeks before returning home and has essentially been bedbound with difficulty in ambulation. Presented 05/01/2020 with increasing back pain. Admission chemistry sodium 134 potassium 2.9 glucose 130 BUN 22 creatinine 0.90, WBC 11,300 hemoglobin 16.9,  urine culture greater 100,000 E. coli. MRI thoracic lumbar spine showed progressed T11 compression fracture since last month increased comminution. Loss of height increased from 20 to 40% and new retropulsion of bone resulting in mild spinal stenosis with mild cord mass-effect but no spinal cord signal abnormality. Associated acute to subacute posterior left rib fractures 9 through 11. Left paraspinal soft tissue edema from T9-T12. Patient underwent thoracic T10-11 arthrodesis allograft morsels percutaneous pedicle screws with fixation 05/04/2020 per Dr. Christella Noa. She continues with back brace as directed. Her chronic Coumadin was initially held for surgical procedure has since been resumed. Patient completed course of Keflex for E. coli UTI. Pain management use of scheduled OxyContin. Physical medicine rehabilitation consulted patient felt to be a candidate for inpatient rehab service due to decrease in functional mobility pain management. She was admitted for a comprehensive rehab program. Patient transferred to CIR on 05/13/2020 .   Patient currently requires min with mobility secondary to muscle weakness, muscle joint tightness and pain.  Prior to hospitalization, patient was modified independent  with mobility and lived with Alone in a House home.  Home access is 1Stairs to enter.  Patient will benefit from skilled PT intervention to maximize safe functional mobility and minimize fall risk for planned discharge home with intermittent assist.  Anticipate patient will benefit from follow up [facility] at discharge.  PT - End of Session Activity Tolerance: Tolerates 30+ min activity with multiple rests Endurance Deficit: Yes Endurance Deficit Description: fatigues needing several rest breaks during session PT Assessment Rehab Potential (ACUTE/IP ONLY): Good PT Barriers to Discharge: Decreased caregiver support;Home environment access/layout PT Barriers to Discharge Comments: lives alone, daughters can  help  get groceries, had a girl twice a week to help cook, do laundry, etc and can get her for little more help PT Patient demonstrates impairments in the following area(s): Balance;Endurance;Motor;Pain;Safety PT Transfers Functional Problem(s): Bed Mobility;Bed to Chair;Car;Furniture PT Locomotion Functional Problem(s): Ambulation;Stairs;Wheelchair Mobility PT Plan PT Intensity: Minimum of 1-2 x/day ,45 to 90 minutes PT Frequency: 5 out of 7 days PT Duration Estimated Length of Stay: 12-14 days PT Treatment/Interventions: Ambulation/gait training;DME/adaptive equipment instruction;Balance/vestibular training;Functional mobility training;Patient/family education;Therapeutic Exercise;Therapeutic Activities;Stair training;UE/LE Strength taining/ROM PT Transfers Anticipated Outcome(s): Mod I PT Locomotion Anticipated Outcome(s): Mod I amulatory with AD PT Recommendation Follow Up Recommendations: Home health PT;Other (comment) (aide) Patient destination: Home Equipment Recommended: None recommended by PT   PT Evaluation Precautions/Restrictions Precautions Precautions: Back Precaution Comments: LSO Required Braces or Orthoses: Spinal Brace Spinal Brace: Lumbar corset;Applied in sitting position Pain Pain Assessment Pain Scale: 0-10 Pain Score: 5  Pain Type: Acute pain Pain Location: Back Pain Orientation: Right;Mid Pain Descriptors / Indicators: Aching Pain Onset: With Activity Pain Intervention(s): Repositioned Home Living/Prior Functioning Home Living Available Help at Discharge: Available PRN/intermittently Type of Home: House Home Access: Stairs to enter Entrance Stairs-Number of Steps: 1 Home Layout: Two level;Able to live on main level with bedroom/bathroom;Other (Comment) Alternate Level Stairs-Number of Steps: 13 Alternate Level Stairs-Rails: Right Bathroom Shower/Tub: Chiropodist: Standard Additional Comments: was using stairs and taking tub baths  prior to first hospitalization in November, after SNF was at home and bedbound till back to hospital and then having surgery  Lives With: Alone Prior Function Level of Independence: Independent with homemaking with ambulation;Requires assistive device for independence Vision/Perception  Perception Perception: Within Functional Limits Praxis Praxis: Intact  Cognition Overall Cognitive Status: Within Functional Limits for tasks assessed Arousal/Alertness: Awake/alert Orientation Level: Oriented X4 Attention: Sustained Awareness: Appears intact Safety/Judgment: Impaired Sensation Sensation Light Touch: Appears Intact Hot/Cold: Not tested Proprioception: Not tested Stereognosis: Not tested Coordination Coordination and Movement Description: Limited by pain Motor  Motor Motor: Within Functional Limits Motor - Skilled Clinical Observations: generalized weakness and pain limited   Trunk/Postural Assessment  Cervical Assessment Cervical Assessment: Exceptions to [facility] (forward head) Thoracic Assessment Thoracic Assessment: Exceptions to [facility] (increased thoracic kyphosis) Lumbar Assessment Lumbar Assessment: Exceptions to [facility] (flexed and rounded spine) Postural Control Postural Control: Deficits on evaluation (postural weakness)  Balance Balance Balance Assessed: Yes Dynamic Sitting Balance Sitting balance - Comments: UE support needed for reaching with supervision Static Standing Balance Static Standing - Balance Support: Bilateral upper extremity supported Static Standing - Level of Assistance: 5: Stand by assistance Dynamic Standing Balance Dynamic Standing - Balance Support: During functional activity;Right upper extremity supported;Left upper extremity supported Dynamic Standing - Level of Assistance: 4: Min assist Dynamic Standing - Balance Activities: Reaching across midline Dynamic Standing - Comments: playing checkers Extremity Assessment      RLE Assessment RLE  Assessment: Exceptions to [facility] Active Range of Motion (AROM) Comments: AROM WFL General Strength Comments: hip flexion 3+/5, knee extension 4-/5, ankle DF 4/5 LLE Assessment LLE Assessment: Exceptions to [facility] Active Range of Motion (AROM) Comments: AROM WFL General Strength Comments: Hip flexion 4/5, knee extension 4+/5, ankle DF 4+/5  Care Tool Care Tool Bed Mobility Roll left and right activity   Roll left and right assist level: Supervision/Verbal cueing    Sit to lying activity   Sit to lying assist level: Minimal Assistance - Patient > 75%    Lying to sitting edge of bed activity   Lying to sitting edge  of bed assist level: Contact Guard/Touching assist     Care Tool Transfers Sit to stand transfer   Sit to stand assist level: Minimal Assistance - Patient > 75% Sit to stand assistive device: Walker;Armrests  Chair/bed transfer   Chair/bed transfer assist level: Minimal Assistance - Patient > 75%     Toilet transfer Toilet transfer activity did not occur: N/A (did not need to toilet during session)      Car transfer   Car transfer assist level: Minimal Assistance - Patient > 75%      Care Tool Locomotion Ambulation   Assist level: Minimal Assistance - Patient > 75% Assistive device: Walker-rolling Max distance: 80'  Walk 10 feet activity   Assist level: Minimal Assistance - Patient > 75% Assistive device: Walker-rolling   Walk 50 feet with 2 turns activity   Assist level: Minimal Assistance - Patient > 75% Assistive device: Walker-rolling  Walk 150 feet activity Walk 150 feet activity did not occur: Safety/medical concerns      Walk 10 feet on uneven surfaces activity   Assist level: Minimal Assistance - Patient > 75% Assistive device: Walker-rolling  Stairs   Assist level: Minimal Assistance - Patient > 75% Stairs assistive device: 2 hand rails Max number of stairs: 4  Walk up/down 1 step activity   Walk up/down 1 step (curb) assist level: Minimal  Assistance - Patient > 75% Walk up/down 1 step or curb assistive device: 2 hand rails    Walk up/down 4 steps activity Walk up/down 4 steps assist level: Minimal Assistance - Patient > 75% Walk up/down 4 steps assistive device: 2 hand rails  Walk up/down 12 steps activity Walk up/down 12 steps activity did not occur: Safety/medical concerns      Pick up small objects from floor Pick up small object from the floor (from standing position) activity did not occur: Safety/medical concerns      Wheelchair Will patient use wheelchair at discharge?: No Type of Wheelchair: Manual   Wheelchair assist level: Supervision/Verbal cueing Max wheelchair distance: 130'  Wheel 50 feet with 2 turns activity   Assist Level: Supervision/Verbal cueing  Wheel 150 feet activity Wheelchair 150 feet activity did not occur: Safety/medical concerns      Refer to Care Plan for Long Term Goals  SHORT TERM GOAL WEEK 1 PT Short Term Goal 1 (Week 1): Patient to perform bed mobility mod I. PT Short Term Goal 2 (Week 1): Patient to perform sit to stand with S. PT Short Term Goal 3 (Week 1): Patient to ambulation 150' with S to occasioinal CGA with RW. PT Short Term Goal 4 (Week 1): Patient to negotiate 12 steps with CGA with rails.  Recommendations for other services: Therapeutic Recreation  Stress management  Skilled Therapeutic Intervention Mobility Bed Mobility Bed Mobility: Supine to Sit;Sit to Supine Supine to Sit: Minimal Assistance - Patient > 75% Sit to Supine: Minimal Assistance - Patient > 75% Transfers Transfers: Sit to Stand;Stand Pivot Transfers Sit to Stand: Contact Guard/Touching assist Stand Pivot Transfers: Minimal Assistance - Patient > 75% Stand Pivot Transfer Details: Verbal cues for sequencing;Tactile cues for placement Transfer (Assistive device): Rolling walker Locomotion  Gait Ambulation: Yes Gait Assistance: Minimal Assistance - Patient > 75% Gait Distance (Feet): 80  Feet Assistive device: Rolling walker Gait Assistance Details: Verbal cues for precautions/safety Gait Assistance Details: assist for safety/balance Gait Gait: Yes Gait Pattern: Impaired Gait Pattern: Step-through pattern;Decreased stride length;Trunk flexed;Shuffle Stairs / Additional Locomotion Ramp: Minimal Assistance - Patient >75%  Architect: Yes Wheelchair Assistance: Chartered loss adjuster: Both upper extremities Wheelchair Parts Management: Needs assistance Distance: 31' with S   Discharge Criteria: Patient will be discharged from PT if patient refuses treatment 3 consecutive times without medical reason, if treatment goals not met, if there is a change in medical status, if patient makes no progress towards goals or if patient is discharged from hospital.  The above assessment, treatment plan, treatment alternatives and goals were discussed and mutually agreed upon: by patient  Jamison Oka, PT 05/14/2020, 3:57 PM

## 2020-05-14 NOTE — Progress Notes (Signed)
ANTICOAGULATION CONSULT NOTE  Pharmacy Consult:  Coumadin Indication: atrial fibrillation  Allergies  Allergen Reactions  . Amlodipine Besylate     Other reaction(s): high dose make feet swell  . Codeine Nausea And Vomiting  . Meperidine Nausea Only  . Metoprolol Succinate [Metoprolol]     Other reaction(s): effects her breathing  . Oxycodone Nausea And Vomiting    Patient Measurements: Height: 5\' 3"  (160 cm) Weight: 72.5 kg (159 lb 13.3 oz) IBW/kg (Calculated) : 52.4  Vital Signs: Temp: 99.4 F (37.4 C) (01/07 0523) Temp Source: Oral (01/07 0523) BP: 147/85 (01/07 0523) Pulse Rate: 91 (01/07 0523)  Labs: Recent Labs    05/12/20 0208 05/13/20 0150 05/14/20 0519  HGB 11.4* 11.2* 11.8*  HCT 35.3* 35.3* 36.3  PLT 217 219 239  LABPROT 25.9* 26.6* 22.9*  INR 2.5* 2.6* 2.1*  CREATININE 0.82  --  0.69    Estimated Creatinine Clearance: 44.6 mL/min (by C-G formula based on SCr of 0.69 mg/dL).   Assessment: 87 yo female admitted on 05/01/2020 with back pain found to have T11 compression fracture. Patient is s/p thoracic 10-11 arthrodesis with neurosurgery.    Patient was on Coumadin PTA for Afib, which was reversed for surgery and then resumed on 05/06/20.   INR therapeutic; no bleeding reported.  Goal of Therapy:  INR 2-3 Monitor platelets by anticoagulation protocol: Yes   Plan:  Resume home Coumadin regimen of 1mg  alternating with 1.5mg  PO every other day Daily PT / INR for now   Thank you Anette Guarneri, PharmD 05/14/2020, 8:36 AM

## 2020-05-14 NOTE — Evaluation (Signed)
Occupational Therapy Assessment and Plan  Patient Details  Name: Danielle Thomas MRN: [id number] Date of Birth: 1928/01/26  OT Diagnosis: acute pain and muscle weakness (generalized) Rehab Potential: Rehab Potential (ACUTE ONLY): Good ELOS: 12-14 days   Today's Date: 05/14/2020 OT Individual Time: 9629-5284 OT Individual Time Calculation (min): 60 min     Hospital Problem: Principal Problem:   Compression fracture of body of thoracic vertebra [facility])   Past Medical History:  Past Medical History:  Diagnosis Date  . Hypertension    Past Surgical History:  Past Surgical History:  Procedure Laterality Date  . KNEE SURGERY    . LUMBAR PERCUTANEOUS PEDICLE SCREW 2 LEVEL N/A 05/04/2020   Procedure: Thoracic nine to Thoracic twelve percutaneous pedicle screws;  Surgeon: Ashok Pall, MD;  Location: [place];  Service: Neurosurgery;  Laterality: N/A;  . LUNG BIOPSY    . PARTIAL HYSTERECTOMY    . TONSILLECTOMY AND ADENOIDECTOMY      Assessment & Plan Clinical Impression: Danielle Thomas is a 87 year old right-handed female with history of atrial fibrillation maintained on Coumadin, hypertension, restless leg syndrome.  Per chart review she lives alone.  Two-level home bed and bath main level one-step to entry.  Uses a Rollator for mobility.  Patient was still driving grocery shopping prior to admission.  She has family that check on her routinely.  Patient with recent admission 03/27/2020 to 03/29/2020 for multiple electrolyte abnormalities.  She was found to have T11 compression fracture which was managed with pain medication and a TLSO back brace.  She was discharged to skilled nursing facility at that time.  She was in a skilled nursing facility for 2 weeks before returning home and has essentially been bedbound with difficulty in ambulation.  Presented 05/01/2020 with increasing back pain.  Admission chemistry sodium 134 potassium 2.9 glucose 130 BUN 22 creatinine 0.90, WBC 11,300 hemoglobin  16.9, urine culture greater 100,000 E. coli.  MRI thoracic lumbar spine showed progressed T11 compression fracture since last month increased comminution.  Loss of height increased from 20 to 40% and new retropulsion of bone resulting in mild spinal stenosis with mild cord mass-effect but no spinal cord signal abnormality.  Associated acute to subacute posterior left rib fractures 9 through 11.  Left paraspinal soft tissue edema from T9-T12.  Patient underwent thoracic T10-11 arthrodesis allograft morsels percutaneous pedicle screws with fixation 05/04/2020 per Dr. Christella Noa.  She continues with back brace as directed.  Her chronic Coumadin was initially held for surgical procedure has since been resumed.  Patient completed course of Keflex for E. coli UTI.  Pain management use of scheduled OxyContin.  Physical medicine rehabilitation consulted patient felt to be a candidate for inpatient rehab service due to decrease in functional mobility pain management.  She was admitted for a comprehensive rehab program.  Patient currently requires min-max assist with basic self-care skills secondary to muscle weakness, decreased cardiorespiratoy endurance and decreased standing balance, decreased postural control, decreased balance strategies and difficulty maintaining precautions.  Prior to hospitalization, patient could complete BADLs/IADLs with modified independent .  Patient will benefit from skilled intervention to increase independence with basic self-care skills prior to discharge home with daily intermittent supervision.  Anticipate patient will require intermittent supervision and follow up OT TBD.  OT - End of Session Activity Tolerance: Decreased this session Endurance Deficit: Yes OT Assessment Rehab Potential (ACUTE ONLY): Good OT Barriers to Discharge: Decreased caregiver support OT Patient demonstrates impairments in the following area(s): Balance;Safety;Endurance;Motor;Pain;Cognition;Sensory OT  Basic ADL's  Functional Problem(s): Grooming;Bathing;Dressing;Toileting;Eating OT Transfers Functional Problem(s): Toilet;Tub/Shower OT Additional Impairment(s): None OT Plan OT Intensity: Minimum of 1-2 x/day, 45 to 90 minutes OT Frequency: 5 out of 7 days OT Duration/Estimated Length of Stay: 12-14 days OT Treatment/Interventions: Balance/vestibular training;Disease mangement/prevention;Self Care/advanced ADL retraining;Therapeutic Exercise;Wheelchair propulsion/positioning;Cognitive remediation/compensation;DME/adaptive equipment instruction;Pain management;Skin care/wound managment;UE/LE Strength taining/ROM;Community reintegration;Functional electrical stimulation;Patient/family education;Splinting/orthotics;UE/LE Coordination activities;Discharge planning;Functional mobility training;Psychosocial support;Therapeutic Activities;Visual/perceptual remediation/compensation OT Self Feeding Anticipated Outcome(s): Mod I OT Basic Self-Care Anticipated Outcome(s): Supervision- Mod I OT Toileting Anticipated Outcome(s): Mod I OT Bathroom Transfers Anticipated Outcome(s): Supervision- Mod I OT Recommendation Patient destination: Home Follow Up Recommendations: Other (comment) (TBD) Equipment Recommended: To be determined   OT Evaluation Precautions/Restrictions  Precautions Precautions: Back Spinal Brace: Thoracolumbosacral orthotic;Applied in sitting position Restrictions Weight Bearing Restrictions: No General   Vital Signs  Pain Pain Assessment Pain Scale: 0-10 Pain Score: 7  Pain Location: Back Home Living/Prior Functioning Home Living Available Help at Discharge: Available PRN/intermittently Type of Home: House Home Access: Stairs to enter Entrance Stairs-Number of Steps: 1 Home Layout: Two level,Able to live on main level with bedroom/bathroom,Other (Comment) (hospital bed on 1st floor) Bathroom Shower/Tub: Chiropodist: Standard  Lives With:  Alone Vision Baseline Vision/History: Wears glasses Wears Glasses: Reading only Vision Assessment?: No apparent visual deficits Perception  Perception: Within Functional Limits Praxis Praxis: Intact Cognition Overall Cognitive Status: Within Functional Limits for tasks assessed Arousal/Alertness: Awake/alert Orientation Level: Person;Place;Situation Person: Oriented Place: Oriented Situation: Oriented Year: 2022 Month: January Day of Week: Correct Memory: Appears intact Immediate Memory Recall: Blue;Sock;Bed Memory Recall Sock: Without Cue Memory Recall Blue: Without Cue Memory Recall Bed: With Cue Attention: Sustained Sustained Attention: Appears intact Awareness: Appears intact Safety/Judgment: Impaired (mild impairments) Sensation Sensation Light Touch: Appears Intact Coordination Gross Motor Movements are Fluid and Coordinated: No Fine Motor Movements are Fluid and Coordinated: Yes Coordination and Movement Description: Limited by pain Motor  Motor Motor - Skilled Clinical Observations: Limited by back pain  Trunk/Postural Assessment  Cervical Assessment Cervical Assessment: Exceptions to [facility] (forward head) Thoracic Assessment Thoracic Assessment: Exceptions to [facility] (scoliosis) Lumbar Assessment Lumbar Assessment: Exceptions to [facility] Postural Control Postural Control: Deficits on evaluation  Balance Balance Balance Assessed: Yes Static Standing Balance Static Standing - Balance Support: Bilateral upper extremity supported Static Standing - Level of Assistance: 5: Stand by assistance Dynamic Standing Balance Dynamic Standing - Balance Support: During functional activity Dynamic Standing - Level of Assistance: 4: Min assist Extremity/Trunk Assessment RUE Assessment RUE Assessment: Within Functional Limits General Strength Comments: 4/5 LUE Assessment LUE Assessment: Within Functional Limits General Strength Comments: 4/5  Care Tool Care Tool Self  Care Eating        Oral Care         Bathing   Body parts bathed by patient: Right arm;Left arm;Chest;Abdomen;Front perineal area;Right upper leg;Left upper leg;Face Body parts bathed by helper: Buttocks;Left lower leg;Right lower leg   Assist Level: Moderate Assistance - Patient 50 - 74%    Upper Body Dressing(including orthotics)   What is the patient wearing?: Pull over shirt   Assist Level: Minimal Assistance - Patient > 75%    Lower Body Dressing (excluding footwear)   What is the patient wearing?: Underwear/pull up;Pants;Incontinence brief Assist for lower body dressing: Maximal Assistance - Patient 25 - 49%    Putting on/Taking off footwear   What is the patient wearing?: [place] for footwear: Total Assistance - Patient < 25%       Care Tool Toileting Toileting activity  Care Tool Bed Mobility Roll left and right activity        Sit to lying activity   Sit to lying assist level: Minimal Assistance - Patient > 75%    Lying to sitting edge of bed activity   Lying to sitting edge of bed assist level: Minimal Assistance - Patient > 75%     Care Tool Transfers Sit to stand transfer   Sit to stand assist level: Minimal Assistance - Patient > 75%    Chair/bed transfer   Chair/bed transfer assist level: Minimal Assistance - Patient > 75%     Toilet transfer         Care Tool Cognition Expression of Ideas and Wants Expression of Ideas and Wants: Without difficulty (complex and basic) - expresses complex messages without difficulty and with speech that is clear and easy to understand   Understanding Verbal and Non-Verbal Content Understanding Verbal and Non-Verbal Content: Understands (complex and basic) - clear comprehension without cues or repetitions   Memory/Recall Ability *first 3 days only Memory/Recall Ability *first 3 days only: Location of own room;Current season;Staff names and faces;That he or she is in a hospital/hospital unit     Refer to Care Plan for [place] 1 OT Short Term Goal 1 (Week 1): Pt will complete LB dressing with min A and use of AE, prn. OT Short Term Goal 2 (Week 1): Pt will complete toileting task with min A OT Short Term Goal 3 (Week 1): Pt will complete bathing with min A OT Short Term Goal 4 (Week 1): Pt will engage in functional ADL/IADL in standing x2 minutes to increase endurance required for self-care tasks.  Recommendations for other services: None    Skilled Therapeutic Intervention OT evaluation completed. Discussed role of OT, rehab process, back precautions, ELOS, possible DME, and safety plan. Pt was motivated for improvement, stating personal goal to return home. Pt became emotional during session, requiring support and encouragement. Pt completed sit<>stand and stand pivot transfers with RW with min A and decreased control with stand>sit. Pt completed bathing and dressing from w/c requiring max A for LB and increased time d/t fatigue. Pt required total assist to don back brace and able to verbalize 1/3 back precautions. At end of session, pt left supine in bed with all needs in reach.   ADL   Mobility  Bed Mobility Bed Mobility: Supine to Sit;Sit to Supine Supine to Sit: Minimal Assistance - Patient > 75% Sit to Supine: Minimal Assistance - Patient > 75% Transfers Sit to Stand: Minimal Assistance - Patient > 75%   Discharge Criteria: Patient will be discharged from OT if patient refuses treatment 3 consecutive times without medical reason, if treatment goals not met, if there is a change in medical status, if patient makes no progress towards goals or if patient is discharged from hospital.  The above assessment, treatment plan, treatment alternatives and goals were discussed and mutually agreed upon: by patient  Duayne Cal 05/14/2020, 9:50 AM

## 2020-05-15 ENCOUNTER — Inpatient Hospital Stay (HOSPITAL_COMMUNITY): Payer: Medicare HMO | Admitting: Physical Therapy

## 2020-05-15 ENCOUNTER — Inpatient Hospital Stay (HOSPITAL_COMMUNITY): Payer: Medicare HMO | Admitting: Occupational Therapy

## 2020-05-15 DIAGNOSIS — I482 Chronic atrial fibrillation, unspecified: Secondary | ICD-10-CM

## 2020-05-15 DIAGNOSIS — K5901 Slow transit constipation: Secondary | ICD-10-CM

## 2020-05-15 DIAGNOSIS — G2581 Restless legs syndrome: Secondary | ICD-10-CM

## 2020-05-15 LAB — PROTIME-INR
INR: 2.4 — ABNORMAL HIGH (ref 0.8–1.2)
Prothrombin Time: 25.5 seconds — ABNORMAL HIGH (ref 11.4–15.2)

## 2020-05-15 MED ORDER — SENNOSIDES-DOCUSATE SODIUM 8.6-50 MG PO TABS
2.0000 | ORAL_TABLET | Freq: Two times a day (BID) | ORAL | Status: DC
  Administered 2020-05-15 – 2020-05-16 (×3): 2 via ORAL
  Filled 2020-05-15 (×4): qty 2

## 2020-05-15 MED ORDER — PSYLLIUM 95 % PO PACK
1.0000 | PACK | Freq: Every day | ORAL | Status: DC
  Administered 2020-05-15 – 2020-05-21 (×5): 1 via ORAL
  Filled 2020-05-15 (×7): qty 1

## 2020-05-15 NOTE — Progress Notes (Signed)
Physical Therapy Session Note  Patient Details  Name: Danielle Thomas MRN: [id number] Date of Birth: 05/02/1928  Today's Date: 05/15/2020 PT Individual Time: 6948-5462 PT Individual Time Calculation (min): 45 min   Short Term Goals: Week 1:  PT Short Term Goal 1 (Week 1): Patient to perform bed mobility mod I. PT Short Term Goal 2 (Week 1): Patient to perform sit to stand with S. PT Short Term Goal 3 (Week 1): Patient to ambulation 150' with S to occasioinal CGA with RW. PT Short Term Goal 4 (Week 1): Patient to negotiate 12 steps with CGA with rails.  Skilled Therapeutic Interventions/Progress Updates:   Pt received supine in bed and agreeable to PT. Pt able to don pants from bed level with set up assist. Supine>sit transfer with supervision assist and min cues for log roll technique. PT assisted pt to don LSO in sitting.  Sit<>stand with CGA form PT. Pt pulled pants to waist with supervision assist and while standing with RW. Stand pivot trasnfewr to [facility] with CGA from PT for safety.   Pt transported to rehab gym in [facility]. Gait training with RW x 73f with supervision assist. Min cues for AD management in turns. No LOB noted.   PT instucted pt in standing therex in parallel bars:  Hip abduction x 6 Mini squat x 8 Tandem stance 2 x 10 sec hold Lunge over noodle x 6  Calf raises.  X 10  Cues for improved posture and increased ROM as well as decreased speed of movement.   Pt returned to room and performed stand pivot transfer to bed with RW and supervision assist. Sit>supine completed with min assist through log rroll for LE management. Pt  left supine in bed with call bell in reach and all needs met.        Therapy Documentation Precautions:  Precautions Precautions: Back Precaution Comments: LSO Required Braces or Orthoses: Spinal Brace Spinal Brace: Lumbar corset,Applied in sitting position Restrictions Weight Bearing Restrictions: No    Vital Signs: Therapy Vitals Temp: 97.6  F (36.4 C) Pulse Rate: 95 Resp: 17 BP: 130/86 Patient Position (if appropriate): Lying Oxygen Therapy SpO2: 93 % O2 Device: Room Air Pain: Pain Assessment Pain Scale: 0-10 Pain Score: 4  Pain Type: Acute pain Pain Location: Back Pain Descriptors / Indicators: Aching Pain Frequency: Constant Pain Onset: On-going Pain Intervention(s): Medication (See eMAR)  Therapy/Group: Individual Therapy  ALorie Phenix1/12/2020, 4:45 PM

## 2020-05-15 NOTE — Progress Notes (Signed)
[place] PHYSICAL MEDICINE & REHABILITATION PROGRESS NOTE   Subjective/Complaints:  Moved bowels yesterday multiple times but still quite hard. C/o ongoing mid/low back pain  ROS: Patient denies fever, rash, sore throat, blurred vision, nausea, vomiting, diarrhea, cough, shortness of breath or chest pain, headache, or mood change.    Objective:   No results found. Recent Labs    05/13/20 0150 05/14/20 0519  WBC 8.7 8.2  HGB 11.2* 11.8*  HCT 35.3* 36.3  PLT 219 239   Recent Labs    05/14/20 0519  NA 134*  K 4.0  CL 103  CO2 23  GLUCOSE 103*  BUN 11  CREATININE 0.69  CALCIUM 8.1*    Intake/Output Summary (Last 24 hours) at 05/15/2020 1112 Last data filed at 05/15/2020 0730 Gross per 24 hour  Intake 222 ml  Output --  Net 222 ml        Physical Exam: Vital Signs Blood pressure 135/88, pulse 92, temperature 98 F (36.7 C), resp. rate 16, height 5\' 3"  (1.6 m), weight 74.1 kg, SpO2 95 %.   Constitutional: No distress . obese Vital signs reviewed. HEENT: EOMI, oral membranes moist Neck: supple Cardiovascular: RRR without murmur. No JVD    Respiratory/Chest: CTA Bilaterally without wheezes or rales. Normal effort    GI/Abdomen: BS +, non-tender, non-distended Ext: no clubbing, cyanosis, or edema Psych: pleasant and cooperative Musculoskeletal: TTP over middle/low back    Comments: UEs 5/5 in deltoids, biceps, triceps, WE grip and finger abd B/L LEs- 4/5 HF, 5-/5 in KE, DF and PF B/L  Skin:Has very large purple bruising across entire upper/mid and lower back-no change surrounding thoracic incision with drain scar- not draining - healing well and lower lumbar incision/scar  Neurological: Ox3        Assessment/Plan: 1. Functional deficits which require 3+ hours per day of interdisciplinary therapy in a comprehensive inpatient rehab setting.  Physiatrist is providing close team supervision and 24 hour management of active medical problems listed  below.  Physiatrist and rehab team continue to assess barriers to discharge/monitor patient progress toward functional and medical goals  Care Tool:  Bathing    Body parts bathed by patient: Right arm,Left arm,Chest,Abdomen,Front perineal area,Right upper leg,Left upper leg,Face   Body parts bathed by helper: Buttocks,Left lower leg,Right lower leg     Bathing assist Assist Level: Moderate Assistance - Patient 50 - 74%     Upper Body Dressing/Undressing Upper body dressing   What is the patient wearing?: Pull over shirt    Upper body assist Assist Level: Moderate Assistance - Patient 50 - 74%    Lower Body Dressing/Undressing Lower body dressing      What is the patient wearing?: Incontinence brief     Lower body assist Assist for lower body dressing: Maximal Assistance - Patient 25 - 49%     Toileting Toileting    Toileting assist Assist for toileting: 2 Helpers     Transfers Chair/bed transfer  Transfers assist     Chair/bed transfer assist level: Minimal Assistance - Patient > 75%     Locomotion Ambulation   Ambulation assist      Assist level: Minimal Assistance - Patient > 75% Assistive device: Walker-rolling Max distance: 80'   Walk 10 feet activity   Assist     Assist level: Minimal Assistance - Patient > 75% Assistive device: Walker-rolling   Walk 50 feet activity   Assist    Assist level: Minimal Assistance - Patient > 75% Assistive device:  Walker-rolling    Walk 150 feet activity   Assist Walk 150 feet activity did not occur: Safety/medical concerns         Walk 10 feet on uneven surface  activity   Assist     Assist level: Minimal Assistance - Patient > 75% Assistive device: Aeronautical engineer Will patient use wheelchair at discharge?: No Type of Wheelchair: Manual    Wheelchair assist level: Supervision/Verbal cueing Max wheelchair distance: 130'    Wheelchair 50 feet with 2  turns activity    Assist        Assist Level: Supervision/Verbal cueing   Wheelchair 150 feet activity     Assist  Wheelchair 150 feet activity did not occur: Safety/medical concerns       Blood pressure 135/88, pulse 92, temperature 98 F (36.7 C), resp. rate 16, height 5\' 3"  (1.6 m), weight 74.1 kg, SpO2 95 %.  Medical Problem List and Plan: 1.  Decreased functional mobility secondary to thoracic T11 compression fracture.  Status post T10-11 arthrodesis percutaneous pedicle screw 05/04/2020.  Back brace as directed             -patient may  Shower- be careful with back brace             -ELOS/Goals:  10-14 days- mod I 2.  Antithrombotics: -DVT/anticoagulation: Chronic Coumadin 1/8- INR per pharmacy             -antiplatelet therapy: N/A 3. Pain Management: OxyContin 10 mg every 12 hours, Percocet 5/325 mg q4 hours prn, Valium as needed muscle spasms.  Monitor mental status esp due to her age. Will verify meds with pharmacy which I did since some meds were changed.   1/7- changed pain meds yesterday- pain doing better- con't regimen   1/8 add kpad for back spasms 4. Mood: Provide emotional support             -antipsychotic agents: N/A 5. Neuropsych: This patient is capable of making decisions on her own behalf. 6. Skin/Wound Care: Routine skin checks 7. Fluids/Electrolytes/Nutrition: Routine in and outs with follow-up chemistries 8.  E. coli UTI.  Completed course of Keflex 9.  Hypertension.  Cozaar 50 mg daily.  Monitor with increased mobility  1/8- BP controlled- con't regimen 10.  Atrial fibrillation.  Coumadin as directed.  Cardiac rate controlled  1/8- rate in 90's. con't regimen 11.  Restless leg syndrome.  Requip 0.25 mg in AM prn and 0.5 mg every evening at 6pm- that was her home dose  1/7- restless leg syndrome doing better with changes- con't regimen 12.  Constipation.  Senokot-S twice daily, MiraLAX twice daily, Dulcolax tablet/ suppository daily as  needed- will add Sorbitol x1 today - (only 1 tiny pebble last 3x).   1/7- will give another dose of Sorbitol 60 G this afternoon and if doesn't go, needs mg citrate over weekend- on a lot of bowel meds- but needs ot have BM- has been 5 days.   1/8- add metamucil to bid miralax       -increase senna to 2 twice daily (didn't receive last two doses however) 13. Poor appetite- could be poor due to constipation will monitor-     LOS: 2 days A FACE TO FACE EVALUATION WAS PERFORMED  Meredith Staggers 05/15/2020, 11:12 AM

## 2020-05-15 NOTE — Progress Notes (Signed)
Occupational Therapy Session Note  Patient Details  Name: Danielle Thomas MRN: [id number] Date of Birth: 1928/03/31  Today's Date: 05/15/2020 OT Individual Time:1045-1130; and 1300-1400 OT Individual Time Calculation (min):45 min; and 60 min    Short Term Goals: Week 1:  OT Short Term Goal 1 (Week 1): Pt will complete LB dressing with min A and use of AE, prn. OT Short Term Goal 2 (Week 1): Pt will complete toileting task with min A OT Short Term Goal 3 (Week 1): Pt will complete bathing with min A OT Short Term Goal 4 (Week 1): Pt will engage in functional ADL/IADL in standing x2 minutes to increase endurance required for self-care tasks.  Skilled Therapeutic Interventions/Progress Updates:    First session: Pt sidelying in bed, c/o 8/10 pain in low back, reports she just received pain medication before OT arrival.  Agreeable to OT session stating "Id like to stand up for a little bit, sometimes it makes my back feel better".  Pt unable to recall any back precautions verbally, however demonstrated good body mechanics throughout session with min VCs.  Pt completed sidelying to sit with supervision. LSO donned with mod assist.  Sit to stand and ambulation to sink using RW with CGA.  Pt brushed teeth and combed hair in standing with supervision.  Pt required min assist for stand to sit to slow descent.  Educated pt on controlled stand to sit technique to protect spine.    Pt educated on figure 4 positioning to donn/doff socks while following spinal precautions. Pt return demonstrated with min assist to thread over toes on bilateral feet.  Pt participated in short distance ambulation within room and approximately 20 feet in hallway using RW with CGA.  Pt requesting back to bed at end of session.  Sit to supine with min assist for BLE support. Max assist to position pillows for sidelying positioning and comfort.  Call bell in reach, bed alarm on.   Second Session:  Pt sidelying in bed, requesting pain  medication due to significant back pain.  Nurse made aware and medication administered during session as well as hot compress applied at end of session for pain management.  Pt agreeable to sinkside self care including dressing and bathing with OT session focused on educating pt on use of AE.  Pt completed sidelying to sit with supervision.  LSO donned with mod assist.  Sit to stand and ambulation to sink using RW with CGA. Again, pt requiring min assist and VCs to slow descent during stand to sit.    Pt doffed LSO in sitting with mod assist with OT providing close supervision to ensure good follow through of back precautions maintained while LSO doffed for bathing and dressing.  Pt completed UB bathing and dressing with supervision using long handled sponge.  LSO re-donned with mod assist.  Pt then stood with supervision and pulled brief and pants over hips.  Pt returned to sitting with supevision for safety.  Pt educated on use of reacher to doff pants and brief over feet with min assist to return demonstrate.  Pt bathed LB using long handled sponge for BLE.  Noted redness and reports of irritation by pt along bilateral inguinal crease and labia majora.  Nurse made aware and barrier cream applied.  Pt donned pants and clean brief using reacher and socks donned using figure 4 positioning with min assist. Pt ambulated sink to EOB using RW and stand to sit with CGA.  Sit to supine also  with CGA.  Pt requesting sidelying and positioned with pillows for support.  Call bell in reach, bed alarm on.    Therapy Documentation Precautions:  Precautions Precautions: Back Precaution Comments: LSO Required Braces or Orthoses: Spinal Brace Spinal Brace: Lumbar corset,Applied in sitting position Restrictions Weight Bearing Restrictions: No   Therapy/Group: Individual Therapy  Ezekiel Slocumb 05/15/2020, 4:24 PM

## 2020-05-15 NOTE — Progress Notes (Addendum)
Offered the metamucil patient claims she had a huge bowel movement last night after they gave her the liquid medicine  but was not charted. She claims it was all over the bed and they have to clean her up and change the bed as well.

## 2020-05-15 NOTE — Progress Notes (Signed)
Physical Therapy Session Note  Patient Details  Name: Danielle Thomas MRN: [id number] Date of Birth: [birth year]  Today's Date: 05/15/2020 PT Individual Time: 4665-9935 PT Individual Time Calculation (min): 53 min   Short Term Goals: Week 1:  PT Short Term Goal 1 (Week 1): Patient to perform bed mobility mod I. PT Short Term Goal 2 (Week 1): Patient to perform sit to stand with S. PT Short Term Goal 3 (Week 1): Patient to ambulation 150' with S to occasioinal CGA with RW. PT Short Term Goal 4 (Week 1): Patient to negotiate 12 steps with CGA with rails.  Skilled Therapeutic Interventions/Progress Updates:   Pt received supine in bed and agreeable to PT. Pt initially reports pain in back but does note rate. Supine>sit transfer with increased time and supervision assist through log roll. Once sitting EOB pt reports pain in back 8/10. PT assisted pt to don LSO with total A for time management. Stand pivot transfe to WC with CGA and RW. Pt transported to day room, requesting additional pain meds. RN present to administer; see MAR. Pt performed sit<>stand from [facility] with CGA-supervision assist throughout session x 10. Pt performed standing tolerance 1.5 min min x 4 whlie engaged in card game connect 4. Intermittent ability to focus on task due to pain. Gait training with RW 2 x 79f with supervision assist for safety. Consistent 8/10-9/10 pain throughout gait training with no relief in sitting. Pt requested use restroom. Stand pivot transfer to toilet with RW and CGA for AD management on tiles. Pt able to void bladder and small bowel movement. Per care completed by PT. Pt returned to room and performed ambulatory transfer to bed with RW and supervision assist. Sit>supine completed with mod assist for BLE management. Min cues for log rolling in bed for improved positioning and adherence to back precaution. Pt  Left sidelying  in bed with call bell in reach and all needs met.        Therapy  Documentation Precautions:  Precautions Precautions: Back Precaution Comments: LSO Required Braces or Orthoses: Spinal Brace Spinal Brace: Lumbar corset,Applied in sitting position Restrictions Weight Bearing Restrictions: No    Pain: Pain Assessment Pain Scale: 0-10 Pain Score: 10-Worst pain ever Pain Type: Acute pain Pain Location: Back Pain Descriptors / Indicators: Aching Pain Frequency: Constant Pain Onset: On-going Pain Intervention(s): Medication (See eMAR)   Therapy/Group: Individual Therapy  ALorie Phenix1/12/2020, 9:44 AM

## 2020-05-15 NOTE — Progress Notes (Signed)
ANTICOAGULATION CONSULT NOTE  Pharmacy Consult:  Coumadin Indication: atrial fibrillation  Allergies  Allergen Reactions  . Amlodipine Besylate     Other reaction(s): high dose make feet swell  . Codeine Nausea And Vomiting  . Meperidine Nausea Only  . Metoprolol Succinate [Metoprolol]     Other reaction(s): effects her breathing  . Oxycodone Nausea And Vomiting    Patient Measurements: Height: 5\' 3"  (160 cm) Weight: 74.1 kg (163 lb 5.8 oz) IBW/kg (Calculated) : 52.4  Vital Signs: Temp: 98 F (36.7 C) (01/08 0511) BP: 135/88 (01/08 0511) Pulse Rate: 92 (01/08 0511)  Labs: Recent Labs    05/13/20 0150 05/14/20 0519 05/15/20 0515  HGB 11.2* 11.8*  --   HCT 35.3* 36.3  --   PLT 219 239  --   LABPROT 26.6* 22.9* 25.5*  INR 2.6* 2.1* 2.4*  CREATININE  --  0.69  --     Estimated Creatinine Clearance: 45.1 mL/min (by C-G formula based on SCr of 0.69 mg/dL).   Assessment: 87 yo female admitted on 05/01/2020 with back pain found to have T11 compression fracture. Patient is s/p thoracic 10-11 arthrodesis with neurosurgery.  Patient was on Coumadin PTA for Afib (PTA dose: 1 mg alternating with 1.5 mg PO every other day), which was reversed for surgery and then resumed on 05/06/20.   INR remains therapeutic; no bleeding reported.  Goal of Therapy:  INR 2-3 Monitor platelets by anticoagulation protocol: Yes   Plan:  Continue home Coumadin regimen of 1mg  alternating with 1.5mg  PO every other day (1.5 mg scheduled for today) Daily PT / INR for now   Rebbeca Paul, PharmD PGY1 Pharmacy Resident 05/15/2020 8:46 AM  Please check [email or web address] for unit-specific pharmacy phone numbers.

## 2020-05-16 LAB — CBC
HCT: 35.3 % — ABNORMAL LOW (ref 36.0–46.0)
Hemoglobin: 11.9 g/dL — ABNORMAL LOW (ref 12.0–15.0)
MCH: 30.4 pg (ref 26.0–34.0)
MCHC: 33.7 g/dL (ref 30.0–36.0)
MCV: 90.1 fL (ref 80.0–100.0)
Platelets: 261 10*3/uL (ref 150–400)
RBC: 3.92 MIL/uL (ref 3.87–5.11)
RDW: 16.6 % — ABNORMAL HIGH (ref 11.5–15.5)
WBC: 7.4 10*3/uL (ref 4.0–10.5)
nRBC: 0 % (ref 0.0–0.2)

## 2020-05-16 LAB — PROTIME-INR
INR: 2.2 — ABNORMAL HIGH (ref 0.8–1.2)
Prothrombin Time: 23.9 seconds — ABNORMAL HIGH (ref 11.4–15.2)

## 2020-05-16 MED ORDER — MAGNESIUM CITRATE PO SOLN
1.0000 | Freq: Once | ORAL | Status: AC
  Administered 2020-05-16: 1 via ORAL
  Filled 2020-05-16: qty 296

## 2020-05-16 NOTE — IPOC Note (Signed)
Overall Plan of Care [facility]) Patient Details Name: FREDRIKA CANBY MRN: [id number] DOB: 01-29-1928  Admitting Diagnosis: Compression fracture of body of thoracic vertebra [facility])  Hospital Problems: Principal Problem:   Compression fracture of body of thoracic vertebra ([facility])     Functional Problem List: Nursing Bladder,Safety,Skin Integrity,Pain,Medication Management,Edema  PT Balance,Endurance,Motor,Pain,Safety  OT Balance,Safety,Endurance,Motor,Pain,Cognition,Sensory  SLP    TR         Basic ADL's: OT Grooming,Bathing,Dressing,Toileting,Eating     Advanced  ADL's: OT       Transfers: PT Bed Mobility,Bed to Chair,Car,Furniture  OT Toilet,Tub/Shower     Locomotion: PT Ambulation,Stairs,Wheelchair Mobility     Additional Impairments: OT None  SLP        TR      Anticipated Outcomes Item Anticipated Outcome  Self Feeding Mod I  Swallowing      Basic self-care  Supervision- Mod I  Toileting  Mod I   Bathroom Transfers Supervision- Mod I  Bowel/Bladder  manage bladder with min assist  Transfers  Mod I  Locomotion  Mod I amulatory with AD  Communication     Cognition     Pain  Pain at or below level 4  Safety/Judgment  Maintain safety with cues/reminders   Therapy Plan: PT Intensity: Minimum of 1-2 x/day ,45 to 90 minutes PT Frequency: 5 out of 7 days PT Duration Estimated Length of Stay: 12-14 days OT Intensity: Minimum of 1-2 x/day, 45 to 90 minutes OT Frequency: 5 out of 7 days OT Duration/Estimated Length of Stay: 12-14 days     Due to the current state of emergency, patients may not be receiving their 3-hours of Medicare-mandated therapy.   Team Interventions: Nursing Interventions Patient/Family Education,Bladder Management,Medication Management,Pain Management,Disease Management/Prevention,Skin Care/Wound [employer]  PT interventions Ambulation/gait training,DME/adaptive equipment instruction,Balance/vestibular  training,Functional mobility training,Patient/family education,Therapeutic Exercise,Therapeutic Activities,Stair training,UE/LE Strength taining/ROM  OT Interventions Balance/vestibular training,Disease mangement/prevention,Self Care/advanced ADL retraining,Therapeutic Exercise,Wheelchair propulsion/positioning,Cognitive remediation/compensation,DME/adaptive equipment instruction,Pain management,Skin care/wound managment,UE/LE Strength taining/ROM,Community reintegration,Functional electrical stimulation,Patient/family education,Splinting/orthotics,UE/LE Coordination activities,Discharge planning,Functional mobility training,Psychosocial support,Therapeutic Activities,Visual/perceptual remediation/compensation  SLP Interventions    TR Interventions    SW/CM Interventions Discharge Planning,Psychosocial Support,Patient/Family Education   Barriers to Discharge MD  Medical stability, Home enviroment access/loayout, Incontinence, Weight and constipation  Nursing      PT Decreased caregiver support,Home environment access/layout lives alone, daughters can help get groceries, had a girl twice a week to help cook, do laundry, etc and can get her for little more help  OT Decreased caregiver support    SLP      SW Decreased caregiver support,Lack of/limited family support     Team Discharge Planning: Destination: PT-Home ,OT- Home , SLP-  Projected Follow-up: PT-Home health PT,Other (comment) (aide), OT-  Other (comment) (TBD), SLP-  Projected Equipment Needs: PT-None recommended by PT, OT- To be determined, SLP-  Equipment Details: PT- , OT-  Patient/family involved in discharge planning: PT- Patient,  OT-Patient, SLP-   MD ELOS: 12-14 days Medical Rehab Prognosis:  Good Assessment: Pt is a 87 yr old female with hx of T11 compression fx, on oxycontin and oxy prn for pain- as well as valium- also on coumadin long term- INR therapuetic.  Also has restless leg syndrome, A fib and constipation  that's significant-   Goals - supervision to mod I    See Team Conference Notes for weekly updates to the plan of care

## 2020-05-16 NOTE — Progress Notes (Signed)
ANTICOAGULATION CONSULT NOTE  Pharmacy Consult:  Coumadin Indication: atrial fibrillation  Allergies  Allergen Reactions  . Amlodipine Besylate     Other reaction(s): high dose make feet swell  . Codeine Nausea And Vomiting  . Meperidine Nausea Only  . Metoprolol Succinate [Metoprolol]     Other reaction(s): effects her breathing  . Oxycodone Nausea And Vomiting    Patient Measurements: Height: 5\' 3"  (160 cm) Weight: 72.3 kg (159 lb 6.3 oz) IBW/kg (Calculated) : 52.4  Vital Signs: Temp: 98.3 F (36.8 C) (01/09 0618) BP: 162/91 (01/09 0618) Pulse Rate: 100 (01/09 0618)  Labs: Recent Labs    05/14/20 0519 05/15/20 0515 05/16/20 0511  HGB 11.8*  --  11.9*  HCT 36.3  --  35.3*  PLT 239  --  261  LABPROT 22.9* 25.5* 23.9*  INR 2.1* 2.4* 2.2*  CREATININE 0.69  --   --     Estimated Creatinine Clearance: 44.6 mL/min (by C-G formula based on SCr of 0.69 mg/dL).   Assessment: 87 yo female admitted on 05/01/2020 with back pain found to have T11 compression fracture. Patient is s/p thoracic 10-11 arthrodesis with neurosurgery.  Patient was on Coumadin PTA for Afib (PTA dose: 1 mg alternating with 1.5 mg PO every other day), which was reversed for surgery and then resumed on 05/06/20.   INR remains therapeutic today at 2.2. CBC stable, no bleeding reported.  Goal of Therapy:  INR 2-3 Monitor platelets by anticoagulation protocol: Yes   Plan:  Continue home Coumadin regimen of 1mg  alternating with 1.5mg  PO every other day (1 mg scheduled for today) Daily PT / INR for now   Rebbeca Paul, PharmD PGY1 Pharmacy Resident 05/16/2020 10:02 AM  Please check [email or web address] for unit-specific pharmacy phone numbers.

## 2020-05-16 NOTE — Progress Notes (Signed)
Offered laxatives but patient refused but her miralax claims it is tearing her stomach.

## 2020-05-16 NOTE — Progress Notes (Signed)
Patient took her senna, metamucil and mg citrate; refused at first but took it anyway. RN explained she needs to have bowel movement this morning if no BM we will do soap suds in pm

## 2020-05-16 NOTE — Progress Notes (Signed)
Patient had a large liquid bowel movement after taking all ordered laxatives.

## 2020-05-16 NOTE — Progress Notes (Signed)
[facility] PHYSICAL MEDICINE & REHABILITATION PROGRESS NOTE   Subjective/Complaints:  Up at EOB eating breakfast. Appreciative of KPad which has helped with back pain.   ROS: Patient denies fever, rash, sore throat, blurred vision, nausea, vomiting, diarrhea, cough, shortness of breath or chest pain,   headache, or mood change.   Objective:   No results found. Recent Labs    05/14/20 0519 05/16/20 0511  WBC 8.2 7.4  HGB 11.8* 11.9*  HCT 36.3 35.3*  PLT 239 261   Recent Labs    05/14/20 0519  NA 134*  K 4.0  CL 103  CO2 23  GLUCOSE 103*  BUN 11  CREATININE 0.69  CALCIUM 8.1*    Intake/Output Summary (Last 24 hours) at 05/16/2020 1026 Last data filed at 05/15/2020 1317 Gross per 24 hour  Intake 160 ml  Output --  Net 160 ml        Physical Exam: Vital Signs Blood pressure (!) 162/91, pulse 100, temperature 98.3 F (36.8 C), resp. rate 16, height 5\' 3"  (1.6 m), weight 72.3 kg, SpO2 94 %.   Constitutional: No distress . Vital signs reviewed. HEENT: EOMI, oral membranes moist Neck: supple Cardiovascular: RRR without murmur. No JVD    Respiratory/Chest: CTA Bilaterally without wheezes or rales. Normal effort    GI/Abdomen: BS +, non-tender, non-distended Ext: no clubbing, cyanosis, or edema Psych: pleasant and cooperative Musculoskeletal: TTP over middle/low back, poor sitting posture    Comments: UEs 5/5 in deltoids, biceps, triceps, WE grip and finger abd B/L LEs- 4/5 HF, 5-/5 in KE, DF and PF B/L  Skin:Has very large purple bruising across entire upper/mid and lower back which is persistent. Surgical incision healed with scar - healing well and lower lumbar incision/scar  Neurological: Ox3, good sitting balance        Assessment/Plan: 1. Functional deficits which require 3+ hours per day of interdisciplinary therapy in a comprehensive inpatient rehab setting.  Physiatrist is providing close team supervision and 24 hour management of active medical  problems listed below.  Physiatrist and rehab team continue to assess barriers to discharge/monitor patient progress toward functional and medical goals  Care Tool:  Bathing    Body parts bathed by patient: Right arm,Left arm,Chest,Abdomen,Front perineal area,Right upper leg,Left upper leg,Face   Body parts bathed by helper: Buttocks,Left lower leg,Right lower leg     Bathing assist Assist Level: Moderate Assistance - Patient 50 - 74%     Upper Body Dressing/Undressing Upper body dressing   What is the patient wearing?: Pull over shirt    Upper body assist Assist Level: Moderate Assistance - Patient 50 - 74%    Lower Body Dressing/Undressing Lower body dressing      What is the patient wearing?: Incontinence brief     Lower body assist Assist for lower body dressing: Maximal Assistance - Patient 25 - 49%     Toileting Toileting    Toileting assist Assist for toileting: Maximal Assistance - Patient 25 - 49%     Transfers Chair/bed transfer  Transfers assist     Chair/bed transfer assist level: Minimal Assistance - Patient > 75%     Locomotion Ambulation   Ambulation assist      Assist level: Minimal Assistance - Patient > 75% Assistive device: Walker-rolling Max distance: 80'   Walk 10 feet activity   Assist     Assist level: Minimal Assistance - Patient > 75% Assistive device: Walker-rolling   Walk 50 feet activity   Assist  Assist level: Minimal Assistance - Patient > 75% Assistive device: Walker-rolling    Walk 150 feet activity   Assist Walk 150 feet activity did not occur: Safety/medical concerns         Walk 10 feet on uneven surface  activity   Assist     Assist level: Minimal Assistance - Patient > 75% Assistive device: Aeronautical engineer Will patient use wheelchair at discharge?: No Type of Wheelchair: Manual    Wheelchair assist level: Supervision/Verbal cueing Max wheelchair  distance: 130'    Wheelchair 50 feet with 2 turns activity    Assist        Assist Level: Supervision/Verbal cueing   Wheelchair 150 feet activity     Assist  Wheelchair 150 feet activity did not occur: Safety/medical concerns       Blood pressure (!) 162/91, pulse 100, temperature 98.3 F (36.8 C), resp. rate 16, height 5\' 3"  (1.6 m), weight 72.3 kg, SpO2 94 %.  Medical Problem List and Plan: 1.  Decreased functional mobility secondary to thoracic T11 compression fracture.  Status post T10-11 arthrodesis percutaneous pedicle screw 05/04/2020.  Back brace as directed             -patient may  Shower- be careful with back brace             -ELOS/Goals:  10-14 days- mod I 2.  Antithrombotics: -DVT/anticoagulation: Chronic Coumadin 1/8- INR per pharmacy             -antiplatelet therapy: N/A 3. Pain Management: OxyContin 10 mg every 12 hours, Percocet 5/325 mg q4 hours prn, Valium as needed muscle spasms.  Monitor mental status esp due to her age. Will verify meds with pharmacy which I did since some meds were changed.   1/7- changed pain meds yesterday- pain doing better- con't regimen   1/9 added kpad for back spasms which pt says has helped 4. Mood: Provide emotional support             -antipsychotic agents: N/A 5. Neuropsych: This patient is capable of making decisions on her own behalf. 6. Skin/Wound Care: Routine skin checks 7. Fluids/Electrolytes/Nutrition: Routine in and outs with follow-up chemistries 8.  E. coli UTI.  Completed course of Keflex 9.  Hypertension.  Cozaar 50 mg daily.  Monitor with increased mobility  1/8- BP controlled- con't regimen 10.  Atrial fibrillation.  Coumadin as directed.  Cardiac rate controlled  1/8- rate in 90's. con't regimen 11.  Restless leg syndrome.  Requip 0.25 mg in AM prn and 0.5 mg every evening at 6pm- that was her home dose  1/7- restless leg syndrome doing better with changes- con't regimen 12.  Constipation.   Senokot-S twice daily, MiraLAX twice daily, Dulcolax tablet/ suppository daily as needed- will add Sorbitol x1 today - (only 1 tiny pebble last 3x).   1/7- will give another dose of Sorbitol 60 G this afternoon and if doesn't go, needs mg citrate over weekend- on a lot of bowel meds- but needs ot have BM- has been 5 days.   1/8- added metamucil to bid miralax       -increase senna to 2 twice daily (didn't receive last two doses however)  1/9 still no bm, she is eating a little bit   -try mg citrate today with SSE if needed 13. Poor appetite- could be poor due to constipation will monitor-   -discussed intake with patient   -RD is  following    LOS: 3 days A FACE TO FACE EVALUATION WAS PERFORMED  Meredith Staggers 05/16/2020, 10:26 AM

## 2020-05-17 ENCOUNTER — Inpatient Hospital Stay (HOSPITAL_COMMUNITY): Payer: Medicare HMO

## 2020-05-17 ENCOUNTER — Inpatient Hospital Stay (HOSPITAL_COMMUNITY): Payer: Medicare HMO | Admitting: Physical Therapy

## 2020-05-17 LAB — PROTIME-INR
INR: 2.2 — ABNORMAL HIGH (ref 0.8–1.2)
Prothrombin Time: 23.6 seconds — ABNORMAL HIGH (ref 11.4–15.2)

## 2020-05-17 MED ORDER — HYDROMORPHONE HCL 2 MG PO TABS
2.0000 mg | ORAL_TABLET | ORAL | Status: DC | PRN
  Administered 2020-05-17 – 2020-05-20 (×9): 2 mg via ORAL
  Filled 2020-05-17 (×9): qty 1

## 2020-05-17 NOTE — Care Management (Signed)
[facility] Individual Statement of Services  Patient Name:  Danielle Thomas  Date:  05/17/2020  Welcome to the [facility].  Our goal is to provide you with an individualized program based on your diagnosis and situation, designed to meet your specific needs.  With this comprehensive rehabilitation program, you will be expected to participate in at least 3 hours of rehabilitation therapies Monday-Friday, with modified therapy programming on the weekends.  Your rehabilitation program will include the following services:  Physical Therapy (PT), Occupational Therapy (OT), 24 hour per day rehabilitation nursing, Therapeutic Recreaction (TR), Psychology, Neuropsychology, Care Coordinator, Rehabilitation Medicine, Nutrition Services, Pharmacy Services and Other  Weekly team conferences will be held on Tuesdays to discuss your progress.  Your Inpatient Rehabilitation Care Coordinator will talk with you frequently to get your input and to update you on team discussions.  Team conferences with you and your family in attendance may also be held.  Expected length of stay: 12-14 days    Overall anticipated outcome: Independent with Assistive Device  Depending on your progress and recovery, your program may change. Your Inpatient Rehabilitation Care Coordinator will coordinate services and will keep you informed of any changes. Your Inpatient Rehabilitation Care Coordinator's name and contact numbers are listed  below.  The following services may also be recommended but are not provided by the [facility] will be made to provide these services after discharge if needed.  Arrangements include referral to agencies that provide these services.  Your insurance has been verified to be:  [employer]  Your  primary doctor is:  Cari Caraway  Pertinent information will be shared with your doctor and your insurance company.  Inpatient Rehabilitation Care Coordinator:  Cathleen Corti [phone] or ([phone]  Information discussed with and copy given to patient by: [name], 05/17/2020, 11:56 AM

## 2020-05-17 NOTE — Progress Notes (Signed)
Physical Therapy Session Note  Patient Details  Name: Danielle Thomas MRN: [id number] Date of Birth: 1928/01/12  Today's Date: 05/17/2020 PT Individual Time: 1050-1205 PT Individual Time Calculation (min): 75 min   Short Term Goals: Week 1:  PT Short Term Goal 1 (Week 1): Patient to perform bed mobility mod I. PT Short Term Goal 2 (Week 1): Patient to perform sit to stand with S. PT Short Term Goal 3 (Week 1): Patient to ambulation 150' with S to occasioinal CGA with RW. PT Short Term Goal 4 (Week 1): Patient to negotiate 12 steps with CGA with rails.  Skilled Therapeutic Interventions/Progress Updates:    Pt received sidelying in bed, agreeable to PT session. Pt reports ongoing pain in low back, not rated. Pt premedicated prior to start of therapy session and use of kpad at end of session for pain management. Supine to sit with CGA with use of bedrail, good recall of logroll technique. Assisted pt with donning lumbar corset while seated EOB. Sit to stand with CGA to RW. Ambulation x 100 ft, x 150 ft with RW and CGA for balance. Ascend/descend 4 x 6" stairs with 2 handrails and min A, step-to gait pattern. Seated BUE endurance activity performing therex with 2# dowel rod x 20 reps: bicep curls, chest press, OH lift. Pt reports fatigue of UE muscles when attempting to perform exercises. Seated 2# dowel rod volleyball x 20 reps to fatigue. Pt requests to return to bed at end of session. Sit to supine CGA. Pt left in L sidelying in bed with needs in reach, bed alarm in place at end of session.  Therapy Documentation Precautions:  Precautions Precautions: Back Precaution Comments: LSO Required Braces or Orthoses: Spinal Brace Spinal Brace: Lumbar corset,Applied in sitting position Restrictions Weight Bearing Restrictions: No    Therapy/Group: Individual Therapy   Excell Seltzer, PT, DPT  05/17/2020, 12:27 PM

## 2020-05-17 NOTE — Progress Notes (Signed)
Occupational Therapy Session Note  Patient Details  Name: Danielle Thomas MRN: [id number] Date of Birth: 11/23/1927  Today's Date: 05/17/2020 OT Individual Time: 1345-1415 OT Individual Time Calculation (min): 30 min  and Today's Date: 05/17/2020 OT Missed Time: 15 Minutes Missed Time Reason: Pain   Short Term Goals: Week 1:  OT Short Term Goal 1 (Week 1): Pt will complete LB dressing with min A and use of AE, prn. OT Short Term Goal 2 (Week 1): Pt will complete toileting task with min A OT Short Term Goal 3 (Week 1): Pt will complete bathing with min A OT Short Term Goal 4 (Week 1): Pt will engage in functional ADL/IADL in standing x2 minutes to increase endurance required for self-care tasks.  Skilled Therapeutic Interventions/Progress Updates:    Pt resting in bed upon arrival and agreeable to "trying some more therapy." Supine>sit EOB with supervision. Dependent for donning LSO. Pt commented that she wanted to work on BUE strengthening. Once sitting EOB and performing one set of BUE arm raises with 2# bar, pt stated she needed to get up and stand/move. Pt amb approx 60' and requested to return to EOB. Pt stated her pain was escalating and she needed to lay back in bed. Sit>supine with supervision. Amb with RW with CGA. Pt remained in bed with pillows for support in sidelying with K-pad in place.  Bed alarm activated. All needs within reach.  Therapy Documentation Precautions:  Precautions Precautions: Back Precaution Comments: LSO Required Braces or Orthoses: Spinal Brace Spinal Brace: Lumbar corset,Applied in sitting position Restrictions Weight Bearing Restrictions: No General: General OT Amount of Missed Time: 15 Minutes Pain:  Pt c/o 10/10 back pain; activity, repositioning, rest, heat, emotional support   Therapy/Group: Individual Therapy  Leroy Libman 05/17/2020, 2:29 PM

## 2020-05-17 NOTE — Progress Notes (Signed)
[employer] PHYSICAL MEDICINE & REHABILITATION PROGRESS NOTE   Subjective/Complaints:  C/O many BMs in last 24 hours- said she "must have gone 2-3 days prior"- however base don chart, no large/medium BM for 5 days before increased meds on Friday- pt upset about frequent BMs-   Said oxy doesn't help pain _ "wasn't taking much"- took 3x in last 24 hours per chart.  Will change to Dilaudid for pain- won't change long acting right now.  Stopped senakot 2 tabs BID now now.   ROS:  Pt denies SOB, abd pain, CP, N/V/C/D, and vision changes   Objective:   No results found. Recent Labs    05/16/20 0511  WBC 7.4  HGB 11.9*  HCT 35.3*  PLT 261   No results for input(s): NA, K, CL, CO2, GLUCOSE, BUN, CREATININE, CALCIUM in the last 72 hours.  Intake/Output Summary (Last 24 hours) at 05/17/2020 1304 Last data filed at 05/17/2020 1302 Gross per 24 hour  Intake 600 ml  Output -  Net 600 ml        Physical Exam: Vital Signs Blood pressure (!) 155/99, pulse 92, temperature (!) 97.5 F (36.4 C), temperature source Oral, resp. rate 18, height 5\' 3"  (1.6 m), weight 72.3 kg, SpO2 96 %.   Constitutional: laying supine in bed; unhappy about frequent stools, NAD HEENT: EOMI, oral membranes moist Neck: supple Cardiovascular: RRR Respiratory/Chest: CTA B/L- no W/R/R- good air movement  GI/Abdomen: Soft, NT, ND, (+)BS  Ext: no clubbing, cyanosis, or edema Psych: pleasant and cooperative Musculoskeletal: TTP over middle/low back, poor sitting posture    Comments: UEs 5/5 in deltoids, biceps, triceps, WE grip and finger abd B/L LEs- 4/5 HF, 5-/5 in KE, DF and PF B/L  Skin:Has very large purple bruising across entire upper/mid and lower back which is persistent. Surgical incision healed with scar - healing well and lower lumbar incision/scar  Neurological: Ox3, good sitting balance        Assessment/Plan: 1. Functional deficits which require 3+ hours per day of interdisciplinary  therapy in a comprehensive inpatient rehab setting.  Physiatrist is providing close team supervision and 24 hour management of active medical problems listed below.  Physiatrist and rehab team continue to assess barriers to discharge/monitor patient progress toward functional and medical goals  Care Tool:  Bathing    Body parts bathed by patient: Right arm,Left arm,Chest,Abdomen,Front perineal area,Right upper leg,Left upper leg,Face   Body parts bathed by helper: Buttocks,Left lower leg,Right lower leg     Bathing assist Assist Level: Moderate Assistance - Patient 50 - 74%     Upper Body Dressing/Undressing Upper body dressing   What is the patient wearing?: Pull over shirt    Upper body assist Assist Level: Moderate Assistance - Patient 50 - 74%    Lower Body Dressing/Undressing Lower body dressing      What is the patient wearing?: Pants,Incontinence brief     Lower body assist Assist for lower body dressing: Minimal Assistance - Patient > 75%     Toileting Toileting    Toileting assist Assist for toileting: Maximal Assistance - Patient 25 - 49%     Transfers Chair/bed transfer  Transfers assist     Chair/bed transfer assist level: Minimal Assistance - Patient > 75%     Locomotion Ambulation   Ambulation assist      Assist level: Minimal Assistance - Patient > 75% Assistive device: Walker-rolling Max distance: 150'   Walk 10 feet activity   Assist  Assist level: Minimal Assistance - Patient > 75% Assistive device: Walker-rolling   Walk 50 feet activity   Assist    Assist level: Minimal Assistance - Patient > 75% Assistive device: Walker-rolling    Walk 150 feet activity   Assist Walk 150 feet activity did not occur: Safety/medical concerns  Assist level: Minimal Assistance - Patient > 75% Assistive device: Walker-rolling    Walk 10 feet on uneven surface  activity   Assist     Assist level: Minimal Assistance -  Patient > 75% Assistive device: Aeronautical engineer Will patient use wheelchair at discharge?: No Type of Wheelchair: Manual    Wheelchair assist level: Supervision/Verbal cueing Max wheelchair distance: 130'    Wheelchair 50 feet with 2 turns activity    Assist        Assist Level: Supervision/Verbal cueing   Wheelchair 150 feet activity     Assist  Wheelchair 150 feet activity did not occur: Safety/medical concerns       Blood pressure (!) 155/99, pulse 92, temperature (!) 97.5 F (36.4 C), temperature source Oral, resp. rate 18, height 5\' 3"  (1.6 m), weight 72.3 kg, SpO2 96 %.  Medical Problem List and Plan: 1.  Decreased functional mobility secondary to thoracic T11 compression fracture.  Status post T10-11 arthrodesis percutaneous pedicle screw 05/04/2020.  Back brace as directed             -patient may  Shower- be careful with back brace             -ELOS/Goals:  10-14 days- mod I 2.  Antithrombotics: -DVT/anticoagulation: Chronic Coumadin 1/8- INR per pharmacy             -antiplatelet therapy: N/A 3. Pain Management: OxyContin 10 mg every 12 hours, Percocet 5/325 mg q4 hours prn, Valium as needed muscle spasms.  Monitor mental status esp due to her age. Will verify meds with pharmacy which I did since some meds were changed.   1/7- changed pain meds yesterday- pain doing better- con't regimen   1/9 added kpad for back spasms which pt says has helped  1/10- changed oxy prn to Dilaudid 2 mg q4 hours prn- con't Oxycontin 10 mg BID- she said "oxy wasn't working".  4. Mood: Provide emotional support             -antipsychotic agents: N/A 5. Neuropsych: This patient is capable of making decisions on her own behalf. 6. Skin/Wound Care: Routine skin checks 7. Fluids/Electrolytes/Nutrition: Routine in and outs with follow-up chemistries 8.  E. coli UTI.  Completed course of Keflex 9.  Hypertension.  Cozaar 50 mg daily.  Monitor with  increased mobility  1/8- BP controlled- con't regimen  1/10- BP somewhat elevated this AM 150s/99- since had been doing well- will monitor- no changes yet 10.  Atrial fibrillation.  Coumadin as directed.  Cardiac rate controlled  1/8- rate in 90's. con't regimen 11.  Restless leg syndrome.  Requip 0.25 mg in AM prn and 0.5 mg every evening at 6pm- that was her home dose  1/7- restless leg syndrome doing better with changes- con't regimen 12.  Constipation.  Senokot-S twice daily, MiraLAX twice daily, Dulcolax tablet/ suppository daily as needed- will add Sorbitol x1 today - (only 1 tiny pebble last 3x).   1/7- will give another dose of Sorbitol 60 G this afternoon and if doesn't go, needs mg citrate over weekend- on a lot of bowel meds- but needs  ot have BM- has been 5 days.   1/8- added metamucil to bid miralax       -increase senna to 2 twice daily (didn't receive last two doses however)  1/9 still no bm, she is eating a little bit   -try mg citrate today with SSE if needed  1/10- pt didn't have BM from before rehab admission to Sunday evening- then upset because she had at least 5 BMs due to severe constipation- however has a different memory of when went/had BM than the chart shows- she went without BM for 5-7 days (had 2 tiny small pellet BMs during that time, but that isn't a BM)- will hold senokot 2 tabs BID for now- and monitor 13. Poor appetite- could be poor due to constipation will monitor-   -discussed intake with patient   -RD is following    LOS: 4 days A FACE TO FACE EVALUATION WAS PERFORMED  Zaniyah Wernette 05/17/2020, 1:04 PM

## 2020-05-17 NOTE — Progress Notes (Signed)
ANTICOAGULATION CONSULT NOTE  Pharmacy Consult:  Coumadin Indication: atrial fibrillation  Allergies  Allergen Reactions  . Amlodipine Besylate     Other reaction(s): high dose make feet swell  . Codeine Nausea And Vomiting  . Meperidine Nausea Only  . Metoprolol Succinate [Metoprolol]     Other reaction(s): effects her breathing  . Oxycodone Nausea And Vomiting    Patient Measurements: Height: 5\' 3"  (160 cm) Weight: 72.3 kg (159 lb 6.3 oz) IBW/kg (Calculated) : 52.4  Vital Signs: Temp: 97.5 F (36.4 C) (01/10 0332) Temp Source: Oral (01/10 0332) BP: 155/99 (01/10 0332) Pulse Rate: 92 (01/10 0332)  Labs: Recent Labs    05/15/20 0515 05/16/20 0511 05/17/20 0555  HGB  --  11.9*  --   HCT  --  35.3*  --   PLT  --  261  --   LABPROT 25.5* 23.9* 23.6*  INR 2.4* 2.2* 2.2*    Estimated Creatinine Clearance: 44.6 mL/min (by C-G formula based on SCr of 0.69 mg/dL).   Assessment: 87 yo female admitted on 05/01/2020 with back pain found to have T11 compression fracture. Patient is s/p thoracic 10-11 arthrodesis with neurosurgery.  Patient was on Coumadin PTA for Afib (PTA dose: 1 mg alternating with 1.5 mg PO every other day), which was reversed for surgery and then resumed on 05/06/20.   INR remains therapeutic today at 2.2. CBC stable, no bleeding reported. Trying to increase PO intake.   Goal of Therapy:  INR 2-3 Monitor platelets by anticoagulation protocol: Yes   Plan:  Continue warfarin 1mg  alternating with 1.5mg  PO every other day  Q M/Th PT / INR, Qmon CBC    Benetta Spar, PharmD, BCPS, BCCP Clinical Pharmacist  Please check AMION for all [place] phone numbers After 10:00 PM, call [place] [phone]

## 2020-05-17 NOTE — Progress Notes (Signed)
Occupational Therapy Session Note  Patient Details  Name: CHONDA BANEY MRN: [id number] Date of Birth: [birth year]  Today's Date: 05/17/2020 OT Individual Time: 0900-1000 OT Individual Time Calculation (min): 60 min    Short Term Goals: Week 1:  OT Short Term Goal 1 (Week 1): Pt will complete LB dressing with min A and use of AE, prn. OT Short Term Goal 2 (Week 1): Pt will complete toileting task with min A OT Short Term Goal 3 (Week 1): Pt will complete bathing with min A OT Short Term Goal 4 (Week 1): Pt will engage in functional ADL/IADL in standing x2 minutes to increase endurance required for self-care tasks.  Skilled Therapeutic Interventions/Progress Updates:    Pt resting in bed upon arrival. Pt agreeable to getting OOB for therapy. Supine>sit EOB with supervision. Pt threaded BLE into pants in supine (pt's request). Sit<>stand from EOB after donning LSO with CGA and pt pulled pants over hips. Pt requested to use toilet and amb with RW into bathroom with CGA. Max A for hygiene. Pt unable to perform hygiene. Pt amb to Day Room and requested to walk "som more." Pt amb following distances with CGA: 44', 48', 55', and 62'. Pt requested to use toilet again at assist levels as above. Pt requested to return to bed at end of session. Sit>supine with supervision. Pt remained in bed with all needs within reach and bed alarm activated. Pt states pain in back is relieved when in sidelying with pillows for support.   Therapy Documentation Precautions:  Precautions Precautions: Back Precaution Comments: LSO Required Braces or Orthoses: Spinal Brace Spinal Brace: Lumbar corset,Applied in sitting position Restrictions Weight Bearing Restrictions: No  Pain:  Pt c/o 7/10 back pain with activity; repositioned and meds admin by RN Deidre Ala at end of session  Therapy/Group: Individual Therapy  Leroy Libman 05/17/2020, 12:17 PM

## 2020-05-18 ENCOUNTER — Inpatient Hospital Stay (HOSPITAL_COMMUNITY): Payer: Medicare HMO

## 2020-05-18 ENCOUNTER — Inpatient Hospital Stay (HOSPITAL_COMMUNITY): Payer: Medicare HMO | Admitting: Physical Therapy

## 2020-05-18 LAB — BASIC METABOLIC PANEL
Anion gap: 8 (ref 5–15)
BUN: 10 mg/dL (ref 8–23)
CO2: 23 mmol/L (ref 22–32)
Calcium: 8.3 mg/dL — ABNORMAL LOW (ref 8.9–10.3)
Chloride: 106 mmol/L (ref 98–111)
Creatinine, Ser: 0.79 mg/dL (ref 0.44–1.00)
GFR, Estimated: >60 mL/min (ref >60)
Glucose, Bld: 97 mg/dL (ref 70–99)
Potassium: 4.2 mmol/L (ref 3.5–5.1)
Sodium: 137 mmol/L (ref 135–145)

## 2020-05-18 MED ORDER — POLYETHYLENE GLYCOL 3350 17 G PO PACK
17.0000 g | PACK | Freq: Every day | ORAL | Status: DC
  Administered 2020-05-20 – 2020-05-21 (×2): 17 g via ORAL
  Filled 2020-05-18 (×3): qty 1

## 2020-05-18 NOTE — Plan of Care (Signed)
  Problem: RH Balance Goal: LTG Patient will maintain dynamic standing balance (PT) Description: LTG:  Patient will maintain dynamic standing balance with assistance during mobility activities (PT) Flowsheets (Taken 05/18/2020 1730) LTG: Pt will maintain dynamic standing balance during mobility activities with:: (downgrade due to safety) Supervision/Verbal cueing Note: downgrade due to safety   Problem: Sit to Stand Goal: LTG:  Patient will perform sit to stand with assistance level (PT) Description: LTG:  Patient will perform sit to stand with assistance level (PT) Flowsheets (Taken 05/18/2020 1730) LTG: PT will perform sit to stand in preparation for functional mobility with assistance level: (downgrade due to safety) Supervision/Verbal cueing Note: downgrade due to safety   Problem: RH Bed to Chair Transfers Goal: LTG Patient will perform bed/chair transfers w/assist (PT) Description: LTG: Patient will perform bed to chair transfers with assistance (PT). Flowsheets (Taken 05/18/2020 1730) LTG: Pt will perform Bed to Chair Transfers with assistance level: (downgrade due to safety) Supervision/Verbal cueing Note: downgrade due to safety   Problem: RH Ambulation Goal: LTG Patient will ambulate in controlled environment (PT) Description: LTG: Patient will ambulate in a controlled environment, # of feet with assistance (PT). Flowsheets (Taken 05/18/2020 1730) LTG: Pt will ambulate in controlled environ  assist needed:: (downgrade due to safety) Supervision/Verbal cueing Note: downgrade due to safety Goal: LTG Patient will ambulate in home environment (PT) Description: LTG: Patient will ambulate in home environment, # of feet with assistance (PT). Flowsheets (Taken 05/18/2020 1730) LTG: Pt will ambulate in home environ  assist needed:: (downgrade due to safety) Supervision/Verbal cueing Note: downgrade due to safety   Problem: RH Wheelchair Mobility Goal: LTG Patient will propel w/c in  controlled environment (PT) Description: LTG: Patient will propel wheelchair in controlled environment, # of feet with assist (PT) Flowsheets (Taken 05/18/2020 1730) LTG: Pt will propel w/c in controlled environ  assist needed:: (upgrade due to progress) Independent with assistive device Note: Upgrade due to progress

## 2020-05-18 NOTE — Progress Notes (Signed)
Physical Therapy Session Note  Patient Details  Name: Danielle Thomas MRN: [id number] Date of Birth: 1927-11-11  Today's Date: 05/18/2020 PT Individual Time: 0800-0900; 1130-1200; 1515-1600 PT Individual Time Calculation (min): 60 min and 30 min and 45 min  Short Term Goals: Week 1:  PT Short Term Goal 1 (Week 1): Patient to perform bed mobility mod I. PT Short Term Goal 2 (Week 1): Patient to perform sit to stand with S. PT Short Term Goal 3 (Week 1): Patient to ambulation 150' with S to occasioinal CGA with RW. PT Short Term Goal 4 (Week 1): Patient to negotiate 12 steps with CGA with rails.  Skilled Therapeutic Interventions/Progress Updates:    Session 1: Pt received in L sidelying in bed, agreeable to PT session. Pt reports significant pain in back this AM. Notified nursing and nursing able to provide pain medication during session. Sidelying to sitting EOB with CGA needed for steadying of trunk. Pt is min A to don LSO while seated EOB. Assisted pt with replacing batteries in her hearing aids for improved ability to communicate throughout session. Pt reports urge to have a BM. Ambulatory transfer into bathroom with RW and CGA. Pt incontinent of bowel in brief prior to arriving at toilet, unable to further void once seated on toilet. Pt is dependent for pericare and mod A for clothing management. Ambulation 2 x 100 ft with RW and CGA with cues for upright posture and decreased UE reliance. Pt requests to return to bed at end of session, min A for sit to supine for RLE management. Pt left in L sidelying in bed with needs in reach, kpad in place to low back, bed alarm in place.  Session 2: Pt received sidelying in bed, agreeable to PT session. Pt reports ongoing pain in her low back, frustrated with medication and/or therapy schedule. Discussed pt requesting pain medication prior to start of therapy sessions by 30-45 min to allow time for pain medication to be in her system prior to start of  therapy sessions. Pt agreeable to attempt requesting medication earlier. Sidelying to sitting EOB with CGA. Assisted pt with donning LSO while seated EOB. Sit to stand with CGA to RW. Ambulation to/from therapy gym with RW and CGA for balance. Static standing balance with no UE support and close SBA to CGA while performing ball toss progressing to ball bounce for balance training. Pt returned to room via w/c due to increase in pain in low back. Pt returned to bed at end of session with min A for RLE management. Pt left sidelying in bed with needs in reach, bed alarm in place, kpad in place to low back.  Session 3: Pt received sidelying in bed, agreeable to PT session. Pt reports ongoing pain in back, not rated. Pt reports having pain medication earlier this PM and utilizes kpad for pain management as well as repositioning. Sidelying to sitting EOB via logroll with CGA for some trunk control and use of bedrail. Pt requires assist to don LSO while seated EOB. Sit to stand with CGA to RW. Pt reports urge to use the bathroom. Ambulatory transfer into bathroom with RW and CGA. Pt able to continently void while seated on toilet. Pt requires assist for clothing management and is dependent for pericare. Transfer to w/c via RW and CGA. Dependent transport via w/c to therapy gym for time conservation. Trial use of rollator this session as pt reports using a rollator at home prior to admission. Ambulation x 90 ft,  x 150 ft with rollator at [place] level. Pt exhibits good balance and safety with use of rollator with min cueing needed for safe brake management. Ambulation with rollator navigating around therapy gym through tight spaces, up/down ramp, and backing up with CGA to retrieve cones from various height surfaces. Pt demos good safety awareness when retrieving objects with use of rollator. Pt requests to return to bed at end of session. Sit to supine CGA for some steadying assist. Pt left in L sidelying in bed with needs  in reach, kpad to low back, bed alarm in place at end of session.   Therapy Documentation Precautions:  Precautions Precautions: Back Precaution Comments: LSO Required Braces or Orthoses: Spinal Brace Spinal Brace: Lumbar corset,Applied in sitting position Restrictions Weight Bearing Restrictions: No    Therapy/Group: Individual Therapy   Excell Seltzer, PT, DPT  05/18/2020, 12:08 PM

## 2020-05-18 NOTE — Progress Notes (Signed)
Occupational Therapy Session Note  Patient Details  Name: Danielle Thomas MRN: [id number] Date of Birth: [birth year]  Today's Date: 05/18/2020 OT Individual Time: 1315-1425 OT Individual Time Calculation (min): 70 min    Short Term Goals: Week 1:  OT Short Term Goal 1 (Week 1): Pt will complete LB dressing with min A and use of AE, prn. OT Short Term Goal 2 (Week 1): Pt will complete toileting task with min A OT Short Term Goal 3 (Week 1): Pt will complete bathing with min A OT Short Term Goal 4 (Week 1): Pt will engage in functional ADL/IADL in standing x2 minutes to increase endurance required for self-care tasks.  Skilled Therapeutic Interventions/Progress Updates:    Pt resting in bed upon arrival with CSW present. Pt agreeable to therapy. Supine>sit EOB with supervision. Pt amb with RW to Day Room for therex before walking to gym. 5 mins level 1 on NuStep. Pt tolerated well. After walking to gym, pt stood at card board for standing activities-retrieving and replacing cards. Pt amb with RW to ortho gym and engaged in [place] activities for 5 mins without rest x 2. Pt returned to room and amb with RW from doorway to bed. Pt doffed LSO and returned to supine with supervision. Pt remained in bed with all needs within reach and bed alarm activated.   Therapy Documentation Precautions:  Precautions Precautions: Back Precaution Comments: LSO Required Braces or Orthoses: Spinal Brace Spinal Brace: Lumbar corset,Applied in sitting position Restrictions Weight Bearing Restrictions: No  Pain: Pain Assessment Pain Scale: 0-10 Pain Score: 7  Pain Type: Acute pain Pain Location: Back Pain Orientation: Mid Pain Descriptors / Indicators: Aching Pain Frequency: Constant Pain Intervention(s):meds admin prior to therapy, repositioned, k-pad   Therapy/Group: Individual Therapy  Leroy Libman 05/18/2020, 2:41 PM

## 2020-05-18 NOTE — Progress Notes (Signed)
Patient ID: Danielle Thomas, female   DOB: [birth year], 87 y.o.   MRN: [id number]  SW met with pt and called pt dtr Inez Catalina while in room to discuss updates from team being pt will need supervision level of care at d/c due to physical condition, and could not support Mod I once home. SW and family processed options for pt. Pt does not want to go to a nursing home and would like to see if she is able to get her friend Earlie Server to help her and being at the home 24/7. SW to provide pt with sitter list, and SNF list from Medicare.gov based on zipcode, and [employer] approved SNF. SW did explain that placed is based on Biochemist, clinical. SW emailed above lists to pt dtr [email or web address].  SW provided pt with above lists for further review. SW to follow-up with preferred SNF locations.   Loralee Pacas, MSW, [place] Office: [phone] Cell: [phone] Fax: [phone]

## 2020-05-18 NOTE — NC FL2 (Signed)
[place] LEVEL OF CARE SCREENING TOOL     IDENTIFICATION  Patient Name: Danielle Thomas Birthdate: 04/25/1928 Sex: female Admission Date (Current Location): 05/13/2020  [facility] and [place] Number:      Facility and Address:  [facility], [place] [address] [place], [place] [address]      Provider Number: [id number]  Attending Physician Name and Address:  [name], MD  Relative Name and Phone Number:  Sharyon Medicus (daughter) - [phone]    Current Level of Care: Hospital Recommended Level of Care: [place] Prior Approval Number:    Date Approved/Denied:   PASRR Number: [id number]  Discharge Plan: SNF    Current Diagnoses: Patient Active Problem List   Diagnosis Date Noted  . Compression fracture of body of thoracic vertebra ([place]) 05/13/2020  . Closed wedge compression fracture of T11 vertebra ([place]) 05/04/2020  . Compression fracture of T11 vertebra ([place]) 05/01/2020  . Multiple fractures of ribs, left side, initial encounter for closed fracture 05/01/2020  . Hypokalemia 03/27/2020  . Chest pressure 06/23/2015  . Chronic anticoagulation 04/09/2014  . Essential hypertension, benign 04/08/2013  . Hyperlipidemia 04/08/2013  . Atrial fibrillation ([place]) 02/13/2013    Orientation RESPIRATION BLADDER Height & Weight     Self,Time,Situation,Place  Normal Continent Weight: 160 lb 7.9 oz (72.8 kg) Height:  5\' 3"  (160 cm)  BEHAVIORAL SYMPTOMS/MOOD NEUROLOGICAL BOWEL NUTRITION STATUS      Continent Diet (Modified Carb diet)  AMBULATORY STATUS COMMUNICATION OF NEEDS Skin   Limited Assist Verbally Bruising,Surgical wounds (surgical wound in tact on back; Bruising to back; wears back brace directly on skin)                       Personal Care Assistance Level of Assistance  Bathing,Dressing Bathing Assistance: Limited assistance   Dressing Assistance: Limited assistance     Functional Limitations Info              SPECIAL CARE FACTORS FREQUENCY  PT (By licensed PT),OT (By licensed OT)     PT Frequency: 5xs per week OT Frequency: 5xs per week            Contractures Contractures Info: Not present    Additional Factors Info  Code Status,Allergies Code Status Info: Full Code Allergies Info: Amlodipine Besylate- high dose makes feet swell; Codeine -Nausea And Vomiting;   Meperidine- Nausea Only;  Metoprolol Succinate (metoprolol) Not Specified - effects her breathing;  Oxycodone Not Specified-Nausea And Vomiting           Current Medications (05/18/2020):  This is the current hospital active medication list Current Facility-Administered Medications  Medication Dose Route Frequency Provider Last Rate Last Admin  . bisacodyl (DULCOLAX) EC tablet 5 mg  5 mg Oral Daily PRN Angiulli, Lavon Paganini, PA-C      . bisacodyl (DULCOLAX) suppository 10 mg  10 mg Rectal Daily PRN Angiulli, Lavon Paganini, PA-C      . diazepam (VALIUM) tablet 5 mg  5 mg Oral Q6H PRN Cathlyn Parsons, PA-C   5 mg at 05/17/20 2136  . dorzolamide (TRUSOPT) 2 % ophthalmic solution 1 drop  1 drop Both Eyes BID Cathlyn Parsons, PA-C   1 drop at 05/18/20 0835  . feeding supplement (ENSURE ENLIVE / ENSURE PLUS) liquid 237 mL  237 mL Oral BID BM AngiulliLavon Paganini, PA-C   237 mL at 05/17/20 1034  . hydrocortisone (ANUSOL-HC) 2.5 % rectal cream  Rectal BID Cathlyn Parsons, PA-C   Given at 05/16/20 1949  . HYDROmorphone (DILAUDID) tablet 2 mg  2 mg Oral Q4H PRN Lovorn, Megan, MD   2 mg at 05/17/20 1834  . latanoprost (XALATAN) 0.005 % ophthalmic solution 1 drop  1 drop Both Eyes QHS Angiulli, Lavon Paganini, PA-C   1 drop at 05/17/20 2140  . losartan (COZAAR) tablet 50 mg  50 mg Oral Daily Cathlyn Parsons, PA-C   50 mg at 05/18/20 0814  . multivitamin with minerals tablet 1 tablet  1 tablet Oral Daily Cathlyn Parsons, PA-C   1 tablet at 05/18/20 4818  . nystatin (MYCOSTATIN/NYSTOP) topical powder   Topical BID Cathlyn Parsons, PA-C   Given at 05/18/20 5631  . ondansetron (ZOFRAN) tablet 4 mg  4 mg Oral Q6H PRN Angiulli, Lavon Paganini, PA-C       Or  . ondansetron [facility]) injection 4 mg  4 mg Intravenous Q6H PRN Angiulli, Lavon Paganini, PA-C      . oxyCODONE (OXYCONTIN) 12 hr tablet 10 mg  10 mg Oral Q12H Lovorn, Megan, MD   10 mg at 05/18/20 0836  . [START ON 05/19/2020] polyethylene glycol (MIRALAX / GLYCOLAX) packet 17 g  17 g Oral Daily Lovorn, Megan, MD      . psyllium (HYDROCIL/METAMUCIL) 1 packet  1 packet Oral Daily Meredith Staggers, MD   1 packet at 05/16/20 1046  . rOPINIRole (REQUIP) tablet 0.25 mg  0.25 mg Oral Daily PRN Lovorn, Jinny Blossom, MD   0.25 mg at 05/14/20 2041  . rOPINIRole (REQUIP) tablet 0.5 mg  0.5 mg Oral Daily Lovorn, Megan, MD   0.5 mg at 05/17/20 1714  . warfarin (COUMADIN) tablet 1 mg  1 mg Oral Q48H Angiulli, Lavon Paganini, PA-C   1 mg at 05/16/20 1743  . warfarin (COUMADIN) tablet 1.5 mg  1.5 mg Oral Q48H Angiulli, Lavon Paganini, PA-C   1.5 mg at 05/17/20 1714  . Warfarin - Pharmacist Dosing Inpatient   Does not apply q1600 Elizabeth Sauer   Given at 05/16/20 1636     Discharge Medications: Please see discharge summary for a list of discharge medications.  Relevant Imaging Results:  Relevant Lab Results:   Additional Information SS#: [id number]  [name], LCSW

## 2020-05-18 NOTE — Plan of Care (Signed)
°  Problem: RH Dressing Goal: LTG Patient will perform upper body dressing (OT) Description: LTG Patient will perform upper body dressing with assist, with/without cues (OT). Flowsheets (Taken 05/18/2020 1113) LTG: Pt will perform upper body dressing with assistance level of:  Not including orthosis  Supervision/Verbal cueing Note: With out the orthosis - will need A Goal: LTG Patient will perform lower body dressing w/assist (OT) Description: LTG: Patient will perform lower body dressing with assist, with/without cues in positioning using equipment (OT) Flowsheets (Taken 05/18/2020 1111) LTG: Pt will perform lower body dressing with assistance level of: (downgraded JLS) Supervision/Verbal cueing Note: downgraded JLS   Problem: RH Toileting Goal: LTG Patient will perform toileting task (3/3 steps) with assistance level (OT) Description: LTG: Patient will perform toileting task (3/3 steps) with assistance level (OT)  Flowsheets (Taken 05/18/2020 1111) LTG: Pt will perform toileting task (3/3 steps) with assistance level: (downgraded JLS) Moderate Assistance - Patient 50 - 74% Note: downgraded JLS- will need assistance with hygiene    Problem: RH Toilet Transfers Goal: LTG Patient will perform toilet transfers w/assist (OT) Description: LTG: Patient will perform toilet transfers with assist, with/without cues using equipment (OT) Flowsheets (Taken 05/18/2020 1111) LTG: Pt will perform toilet transfers with assistance level of: (downgraded JLS) Supervision/Verbal cueing Note: downgraded JLS

## 2020-05-18 NOTE — Progress Notes (Signed)
[place] PHYSICAL MEDICINE & REHABILITATION PROGRESS NOTE   Subjective/Complaints:  Pt reports still having loose stools- had big blowout on bed this AM- didn't even know she went/had accident.   Also, slept MUCH better with dilaudid- still having pain, but admits, it's better with Dilaudid than it was with Oxycodone.   Still miserable"- but wasn't able to explain exactly why.   ROS:   Pt denies SOB, abd pain, CP, N/V/C/D, and vision changes  Objective:   No results found. Recent Labs    05/16/20 0511  WBC 7.4  HGB 11.9*  HCT 35.3*  PLT 261   Recent Labs    05/18/20 0534  NA 137  K 4.2  CL 106  CO2 23  GLUCOSE 97  BUN 10  CREATININE 0.79  CALCIUM 8.3*    Intake/Output Summary (Last 24 hours) at 05/18/2020 1018 Last data filed at 05/17/2020 1840 Gross per 24 hour  Intake 360 ml  Output --  Net 360 ml        Physical Exam: Vital Signs Blood pressure (!) 159/93, pulse 99, temperature 97.9 F (36.6 C), resp. rate 15, height 5\' 3"  (1.6 m), weight 72.8 kg, SpO2 94 %.   Constitutional: sitting up in bedside chair, PT in room, NAD HEENT: EOMI, oral membranes moist Neck: supple Cardiovascular: borderline tachycardia-  Respiratory/Chest: CTA B/L- no W/R/R- good air movement GI/Abdomen: Soft, NT, ND, (+)BS -hypoactive this AM Ext: no clubbing, cyanosis, or edema Psych: a little grumpy Musculoskeletal: TTP over middle/low back, poor sitting posture    Comments: UEs 5/5 in deltoids, biceps, triceps, WE grip and finger abd B/L LEs- 4/5 HF, 5-/5 in KE, DF and PF B/L  Skin:Has very large purple bruising across entire upper/mid and lower back which is persistent. Surgical incision healed with scar - healing well and lower lumbar incision/scar  Neurological: Ox3- HOH        Assessment/Plan: 1. Functional deficits which require 3+ hours per day of interdisciplinary therapy in a comprehensive inpatient rehab setting.  Physiatrist is providing close team  supervision and 24 hour management of active medical problems listed below.  Physiatrist and rehab team continue to assess barriers to discharge/monitor patient progress toward functional and medical goals  Care Tool:  Bathing    Body parts bathed by patient: Right arm,Left arm,Chest,Abdomen,Front perineal area,Right upper leg,Left upper leg,Face   Body parts bathed by helper: Buttocks,Left lower leg,Right lower leg     Bathing assist Assist Level: Moderate Assistance - Patient 50 - 74%     Upper Body Dressing/Undressing Upper body dressing   What is the patient wearing?: Pull over shirt    Upper body assist Assist Level: Moderate Assistance - Patient 50 - 74%    Lower Body Dressing/Undressing Lower body dressing      What is the patient wearing?: Pants,Incontinence brief     Lower body assist Assist for lower body dressing: Minimal Assistance - Patient > 75%     Toileting Toileting    Toileting assist Assist for toileting: Maximal Assistance - Patient 25 - 49%     Transfers Chair/bed transfer  Transfers assist     Chair/bed transfer assist level: Minimal Assistance - Patient > 75%     Locomotion Ambulation   Ambulation assist      Assist level: Minimal Assistance - Patient > 75% Assistive device: Walker-rolling Max distance: 150'   Walk 10 feet activity   Assist     Assist level: Minimal Assistance - Patient > 75%  Assistive device: Walker-rolling   Walk 50 feet activity   Assist    Assist level: Minimal Assistance - Patient > 75% Assistive device: Walker-rolling    Walk 150 feet activity   Assist Walk 150 feet activity did not occur: Safety/medical concerns  Assist level: Minimal Assistance - Patient > 75% Assistive device: Walker-rolling    Walk 10 feet on uneven surface  activity   Assist     Assist level: Minimal Assistance - Patient > 75% Assistive device: Aeronautical engineer Will patient use  wheelchair at discharge?: No Type of Wheelchair: Manual    Wheelchair assist level: Supervision/Verbal cueing Max wheelchair distance: 130'    Wheelchair 50 feet with 2 turns activity    Assist        Assist Level: Supervision/Verbal cueing   Wheelchair 150 feet activity     Assist  Wheelchair 150 feet activity did not occur: Safety/medical concerns       Blood pressure (!) 159/93, pulse 99, temperature 97.9 F (36.6 C), resp. rate 15, height 5\' 3"  (1.6 m), weight 72.8 kg, SpO2 94 %.  Medical Problem List and Plan: 1.  Decreased functional mobility secondary to thoracic T11 compression fracture.  Status post T10-11 arthrodesis percutaneous pedicle screw 05/04/2020.  Back brace as directed             -patient may  Shower- be careful with back brace             -ELOS/Goals:  10-14 days- mod I 2.  Antithrombotics: -DVT/anticoagulation: Chronic Coumadin 1/8- INR per pharmacy 1/11- INR 2.2- per pharmacy             -antiplatelet therapy: N/A 3. Pain Management: OxyContin 10 mg every 12 hours, Percocet 5/325 mg q4 hours prn, Valium as needed muscle spasms.  Monitor mental status esp due to her age. Will verify meds with pharmacy which I did since some meds were changed.   1/7- changed pain meds yesterday- pain doing better- con't regimen   1/9 added kpad for back spasms which pt says has helped  1/10- changed oxy prn to Dilaudid 2 mg q4 hours prn- con't Oxycontin 10 mg BID- she said "oxy wasn't working".  1/11- reports pain doing better than on  Oxycodone- con't dilaudid and Oxycontin for now.   4. Mood: Provide emotional support             -antipsychotic agents: N/A 5. Neuropsych: This patient is capable of making decisions on her own behalf. 6. Skin/Wound Care: Routine skin checks 7. Fluids/Electrolytes/Nutrition: Routine in and outs with follow-up chemistries 8.  E. coli UTI.  Completed course of Keflex 9.  Hypertension.  Cozaar 50 mg daily.  Monitor with increased  mobility  1/8- BP controlled- con't regimen  1/10- BP somewhat elevated this AM 150s/99- since had been doing well- will monitor- no changes yet 10.  Atrial fibrillation.  Coumadin as directed.  Cardiac rate controlled  1/8- rate in 90's. con't regimen 11.  Restless leg syndrome.  Requip 0.25 mg in AM prn and 0.5 mg every evening at 6pm- that was her home dose  1/7- restless leg syndrome doing better with changes- con't regimen 12.  Constipation.  Senokot-S twice daily, MiraLAX twice daily, Dulcolax tablet/ suppository daily as needed- will add Sorbitol x1 today - (only 1 tiny pebble last 3x).   1/7- will give another dose of Sorbitol 60 G this afternoon and if doesn't go, needs mg citrate  over weekend- on a lot of bowel meds- but needs ot have BM- has been 5 days.   1/8- added metamucil to bid miralax       -increase senna to 2 twice daily (didn't receive last two doses however)  1/9 still no bm, she is eating a little bit   -try mg citrate today with SSE if needed  1/10- pt didn't have BM from before rehab admission to Sunday evening- then upset because she had at least 5 BMs due to severe constipation- however has a different memory of when went/had BM than the chart shows- she went without BM for 5-7 days (had 2 tiny small pellet BMs during that time, but that isn't a BM)- will hold senokot 2 tabs BID for now- and monitor  1/11- had multiple accidents in last 24 hours- off Senokot completely- also decreased miralax today to 1 dose/day- con't metmucil to bulk up stools.  13. Poor appetite- could be poor due to constipation will monitor-   -discussed intake with patient   -RD is following    LOS: 5 days A FACE TO FACE EVALUATION WAS PERFORMED  Sherion Dooly 05/18/2020, 10:18 AM

## 2020-05-19 ENCOUNTER — Inpatient Hospital Stay (HOSPITAL_COMMUNITY): Payer: Medicare HMO | Admitting: Physical Therapy

## 2020-05-19 ENCOUNTER — Inpatient Hospital Stay (HOSPITAL_COMMUNITY): Payer: Medicare HMO

## 2020-05-19 MED ORDER — FENTANYL 25 MCG/HR TD PT72
1.0000 | MEDICATED_PATCH | TRANSDERMAL | Status: DC
  Administered 2020-05-19: 1 via TRANSDERMAL
  Filled 2020-05-19: qty 1

## 2020-05-19 MED ORDER — HYDROMORPHONE HCL 2 MG PO TABS
2.0000 mg | ORAL_TABLET | Freq: Every day | ORAL | Status: DC
  Administered 2020-05-19 – 2020-05-20 (×2): 2 mg via ORAL
  Filled 2020-05-19: qty 1

## 2020-05-19 NOTE — Progress Notes (Signed)
Physical Therapy Session Note  Patient Details  Name: Danielle Thomas MRN: [id number] Date of Birth: [birth year]  Today's Date: 05/19/2020 PT Individual Time: 3552-1747 PT Individual Time Calculation (min): 55 min   Short Term Goals: Week 1:  PT Short Term Goal 1 (Week 1): Patient to perform bed mobility mod I. PT Short Term Goal 2 (Week 1): Patient to perform sit to stand with S. PT Short Term Goal 3 (Week 1): Patient to ambulation 150' with S to occasioinal CGA with RW. PT Short Term Goal 4 (Week 1): Patient to negotiate 12 steps with CGA with rails.  Skilled Therapeutic Interventions/Progress Updates: Pt presented in bed agreeable to therapy. Pt c/o pain 8/10, had not received pain meds prior to therapy session. PTA notified nurse and pt received meds at start of session. Performed supine to sit with supervision, increased time and use of bed features. Pt received rollator and ambulated to day room without rest break with CGA fading to close S. Participated in standing balance using peg board without AD encouraging pt to use BUE. Pt also participated in seated/standing therex as follows: seated: LAQ, standing :Hip abd/add, hamstring curls, mini squats. All performed 2 x 10 bilaterally with pt requiring seated rest breaks between bouts. Pt then ambulated back to room with rollator in same manner as prior and pt returned to supine from EBO with close S and use of bed features. Pt repositioned to comfort and left in L sidelying with K pad placed across R flank and back. Pt left in bed at end of session with bed alarm on, call bell within reach and needs met.      Therapy Documentation Precautions:  Precautions Precautions: Back Precaution Comments: LSO Required Braces or Orthoses: Spinal Brace Spinal Brace: Lumbar corset,Applied in sitting position Restrictions Weight Bearing Restrictions: No General:   Vital Signs: Therapy Vitals Temp: 97.8 F (36.6 C) Pulse Rate: 77 Resp: 16 BP:  113/75 Patient Position (if appropriate): Lying Oxygen Therapy SpO2: 93 % O2 Device: Room Air Pain: Pain Assessment Pain Score: 5  Mobility:   Locomotion :    Trunk/Postural Assessment :    Balance:   Exercises:   Other Treatments:      Therapy/Group: Individual Therapy  Leelyn Jasinski 05/19/2020, 4:18 PM

## 2020-05-19 NOTE — Evaluation (Addendum)
Recreational Therapy Assessment and Plan  Patient Details  Name: Danielle Thomas MRN: [id number] Date of Birth: [birth year] Today's Date: 05/19/2020  Pain:  C/o 8-10/10 back pain, RN provided pain medication, frequent rest breaks needed  Rehab Potential:  Good  ELOS:   d/c 05/26/20  Assessment  Hospital Problem: Principal Problem:   Compression fracture of body of thoracic vertebra [facility])   Past Medical History:      Past Medical History:  Diagnosis Date  . Hypertension    Past Surgical History:       Past Surgical History:  Procedure Laterality Date  . KNEE SURGERY    . LUMBAR PERCUTANEOUS PEDICLE SCREW 2 LEVEL N/A 05/04/2020   Procedure: Thoracic nine to Thoracic twelve percutaneous pedicle screws;  Surgeon: Ashok Pall, MD;  Location: [place];  Service: Neurosurgery;  Laterality: N/A;  . LUNG BIOPSY    . PARTIAL HYSTERECTOMY    . TONSILLECTOMY AND ADENOIDECTOMY      Assessment & Plan Clinical Impression: Danielle Thomas is a 87 year old right-handed female with history of atrial fibrillation maintained on Coumadin, hypertension, restless leg syndrome. Per chart review she lives alone. Two-level home bed and bath main level one-step to entry. Uses a Rollator for mobility. Patient was still driving grocery shopping prior to admission. She has family that check on her routinely. Patient with recent admission 03/27/2020 to 03/29/2020 for multiple electrolyte abnormalities. She was found to have T11 compression fracture which was managed with pain medication and a TLSO back brace. She was discharged to skilled nursing facility at that time. She was in a skilled nursing facility for 2 weeks before returning home and has essentially been bedbound with difficulty in ambulation. Presented 05/01/2020 with increasing back pain. Admission chemistry sodium 134 potassium 2.9 glucose 130 BUN 22 creatinine 0.90, WBC 11,300 hemoglobin 16.9, urine culture greater 100,000 E.  coli. MRI thoracic lumbar spine showed progressed T11 compression fracture since last month increased comminution. Loss of height increased from 20 to 40% and new retropulsion of bone resulting in mild spinal stenosis with mild cord mass-effect but no spinal cord signal abnormality. Associated acute to subacute posterior left rib fractures 9 through 11. Left paraspinal soft tissue edema from T9-T12. Patient underwent thoracic T10-11 arthrodesis allograft morsels percutaneous pedicle screws with fixation 05/04/2020 per Dr. Christella Noa. She continues with back brace as directed. Her chronic Coumadin was initially held for surgical procedure has since been resumed. Patient completed course of Keflex for E. coli UTI. Pain management use of scheduled OxyContin. Physical medicine rehabilitation consulted patient felt to be a candidate for inpatient rehab service due to decrease in functional mobility pain management. She was admitted for a comprehensive rehab program. Patient transferred to CIR on 05/13/2020.   Pt presents with decreased activity tolerance, decreased functional mobility, decreased balance Limiting pt's independence with leisure/community pursuits.  Met with pt to discuss leisure interests, activity analysis/modifications, and stress management/relaxation strategies during co-treat with OT.  Pt discussed activity/leisure limitations due to pain PTA.  Pt described herself as being socially active enjoying leisure activities with friends on a regular basis.  Also discussed relaxation strategies to assist with pain and stress management including diaphragmatic breathing and aroma therapy.  Verbal instruction, demonstration and return demonstration of diaphragmatic breathing completed.    Plan  Min 1 TR session >20 minutes per week during LOS  Recommendations for other services: None   Discharge Criteria: Patient will be discharged from TR if patient refuses treatment 3 consecutive times  without  medical reason.  If treatment goals not met, if there is a change in medical status, if patient makes no progress towards goals or if patient is discharged from hospital.  The above assessment, treatment plan, treatment alternatives and goals were discussed and mutually agreed upon: by patient   Session note: Discussed the use of aromatherapy to assist in relaxation & pain management.  Discussion included type of oil used, how it would be delivered, desired effect and complications from use.  2 drops of lavender essential oil was applied to a cotton ball and placed on bedside table for inhalation in the room.  Nursing alerted about pts use of aromatherapy and instructed to remove essential oil from room with essential oil applied and discard it in the trash if pt requesting removal.  Pt stated understanding and is agreement with the above.    [place] 05/19/2020, 4:04 PM

## 2020-05-19 NOTE — Progress Notes (Signed)
[place] PHYSICAL MEDICINE & REHABILITATION PROGRESS NOTE   Subjective/Complaints:  Pt reports had "another" blow out yesterday, but not sure if was the early AM blow out or not? Said oxycontin isn't doing much for pain- wants something else- even dilaudid, which helps, doesn't get early enough to get pain relief before AM therapy - will scheduled Am dilaudid at 6am daily.  Just got pain meds after PT started.    ROS:   Pt denies SOB, abd pain, CP, N/V/C/D, and vision changes   Objective:   No results found. No results for input(s): WBC, HGB, HCT, PLT in the last 72 hours. Recent Labs    05/18/20 0534  NA 137  K 4.2  CL 106  CO2 23  GLUCOSE 97  BUN 10  CREATININE 0.79  CALCIUM 8.3*    Intake/Output Summary (Last 24 hours) at 05/19/2020 1629 Last data filed at 05/19/2020 0736 Gross per 24 hour  Intake 200 ml  Output --  Net 200 ml        Physical Exam: Vital Signs Blood pressure 113/75, pulse 77, temperature 97.8 F (36.6 C), resp. rate 16, height 5\' 3"  (1.6 m), weight 72.8 kg, SpO2 93 %.   Constitutional: sitting in manual w/c, bathing with PT, c/o pain, otherwise appropriate, NAD HEENT: EOMI, oral membranes moist Neck: supple Cardiovascular: irregular- rate controlled Respiratory/Chest: CTA B/L- no W/R/R- good air movement GI/Abdomen: Soft, NT, ND, (+)BS  Ext: no clubbing, cyanosis, or edema Psych: a little grumpy still Musculoskeletal: TTP over middle/low back, poor sitting posture    Comments: UEs 5/5 in deltoids, biceps, triceps, WE grip and finger abd B/L LEs- 4/5 HF, 5-/5 in KE, DF and PF B/L  Skin: still has a very large dark purple bruise across entire upper back- incision for surgery underneath it- basically healed over- no drainage, no erythema Neurological: Ox3- HOH        Assessment/Plan: 1. Functional deficits which require 3+ hours per day of interdisciplinary therapy in a comprehensive inpatient rehab setting.  Physiatrist is  providing close team supervision and 24 hour management of active medical problems listed below.  Physiatrist and rehab team continue to assess barriers to discharge/monitor patient progress toward functional and medical goals  Care Tool:  Bathing    Body parts bathed by patient: Chest,Front perineal area,Buttocks,Face   Body parts bathed by helper: Buttocks,Left lower leg,Right lower leg     Bathing assist Assist Level: Set up assist     Upper Body Dressing/Undressing Upper body dressing   What is the patient wearing?: Pull over shirt    Upper body assist Assist Level: Set up assist    Lower Body Dressing/Undressing Lower body dressing      What is the patient wearing?: Pants,Incontinence brief     Lower body assist Assist for lower body dressing: Minimal Assistance - Patient > 75%     Toileting Toileting    Toileting assist Assist for toileting: Maximal Assistance - Patient 25 - 49%     Transfers Chair/bed transfer  Transfers assist     Chair/bed transfer assist level: Contact Guard/Touching assist     Locomotion Ambulation   Ambulation assist      Assist level: Contact Guard/Touching assist Assistive device: Rollator Max distance: 50'   Walk 10 feet activity   Assist     Assist level: Contact Guard/Touching assist Assistive device: Rollator   Walk 50 feet activity   Assist    Assist level: Contact Guard/Touching assist Assistive device:  Rollator    Walk 150 feet activity   Assist Walk 150 feet activity did not occur: Safety/medical concerns  Assist level: Contact Guard/Touching assist Assistive device: Rollator    Walk 10 feet on uneven surface  activity   Assist     Assist level: Minimal Assistance - Patient > 75% Assistive device: Aeronautical engineer Will patient use wheelchair at discharge?: No Type of Wheelchair: Manual    Wheelchair assist level: Supervision/Verbal cueing Max  wheelchair distance: 130'    Wheelchair 50 feet with 2 turns activity    Assist        Assist Level: Supervision/Verbal cueing   Wheelchair 150 feet activity     Assist  Wheelchair 150 feet activity did not occur: Safety/medical concerns       Blood pressure 113/75, pulse 77, temperature 97.8 F (36.6 C), resp. rate 16, height 5\' 3"  (1.6 m), weight 72.8 kg, SpO2 93 %.  Medical Problem List and Plan: 1.  Decreased functional mobility secondary to thoracic T11 compression fracture.  Status post T10-11 arthrodesis percutaneous pedicle screw 05/04/2020.  Back brace as directed             -patient may  Shower- be careful with back brace             -ELOS/Goals:  10-14 days- mod I 2.  Antithrombotics: -DVT/anticoagulation: Chronic Coumadin 1/8- INR per pharmacy 1/11- INR 2.2- per pharmacy             -antiplatelet therapy: N/A 3. Pain Management: OxyContin 10 mg every 12 hours, Percocet 5/325 mg q4 hours prn, Valium as needed muscle spasms.  Monitor mental status esp due to her age. Will verify meds with pharmacy which I did since some meds were changed.   1/7- changed pain meds yesterday- pain doing better- con't regimen   1/9 added kpad for back spasms which pt says has helped  1/10- changed oxy prn to Dilaudid 2 mg q4 hours prn- con't Oxycontin 10 mg BID- she said "oxy wasn't working".  1/11- reports pain doing better than on  Oxycodone- con't dilaudid and Oxycontin for now.    1/12- changed Oxycontin to Duragesic 25 mcg q3 days- and scheduled AM dilaudid at 6am- otherwise prn.  4. Mood: Provide emotional support             -antipsychotic agents: N/A 5. Neuropsych: This patient is capable of making decisions on her own behalf. 6. Skin/Wound Care: Routine skin checks 7. Fluids/Electrolytes/Nutrition: Routine in and outs with follow-up chemistries 8.  E. coli UTI.  Completed course of Keflex 9.  Hypertension.  Cozaar 50 mg daily.  Monitor with increased mobility  1/8-  BP controlled- con't regimen  1/10- BP somewhat elevated this AM 150s/99- since had been doing well- will monitor- no changes yet 10.  Atrial fibrillation.  Coumadin as directed.  Cardiac rate controlled  1/8- rate in 90's. con't regimen  1/12- Rate controlled- con't coumadin and other meds 11.  Restless leg syndrome.  Requip 0.25 mg in AM prn and 0.5 mg every evening at 6pm- that was her home dose  1/7- restless leg syndrome doing better with changes- con't regimen 12.  Constipation.  Senokot-S twice daily, MiraLAX twice daily, Dulcolax tablet/ suppository daily as needed- will add Sorbitol x1 today - (only 1 tiny pebble last 3x).   1/7- will give another dose of Sorbitol 60 G this afternoon and if doesn't go, needs mg citrate over  weekend- on a lot of bowel meds- but needs ot have BM- has been 5 days.   1/8- added metamucil to bid miralax       -increase senna to 2 twice daily (didn't receive last two doses however)  1/9 still no bm, she is eating a little bit   -try mg citrate today with SSE if needed  1/10- pt didn't have BM from before rehab admission to Sunday evening- then upset because she had at least 5 BMs due to severe constipation- however has a different memory of when went/had BM than the chart shows- she went without BM for 5-7 days (had 2 tiny small pellet BMs during that time, but that isn't a BM)- will hold senokot 2 tabs BID for now- and monitor  1/11- had multiple accidents in last 24 hours- off Senokot completely- also decreased miralax today to 1 dose/day- con't metmucil to bulk up stools.   1/12- not sure if had another blowout yesterday- off miralax and senokot for now- but will restart senokot tomorrow if no BM.  13. Poor appetite- could be poor due to constipation will monitor-   -discussed intake with patient   -RD is following    LOS: 6 days A FACE TO FACE EVALUATION WAS PERFORMED  Kenlee Maler 05/19/2020, 4:29 PM

## 2020-05-19 NOTE — Progress Notes (Signed)
Physical Therapy Session Note  Patient Details  Name: Danielle Thomas MRN: [id number] Date of Birth: 05-23-1927  Today's Date: 05/19/2020 PT Individual Time: 0800-0900 PT Individual Time Calculation (min): 60 min   Short Term Goals: Week 1:  PT Short Term Goal 1 (Week 1): Patient to perform bed mobility mod I. PT Short Term Goal 2 (Week 1): Patient to perform sit to stand with S. PT Short Term Goal 3 (Week 1): Patient to ambulation 150' with S to occasioinal CGA with RW. PT Short Term Goal 4 (Week 1): Patient to negotiate 12 steps with CGA with rails.  Skilled Therapeutic Interventions/Progress Updates:    Pt received sidelying in bed, agreeable to PT session. Pt reports pain in low back, not rated. Pt reports being premedicated prior to start of therapy session and utilized kpad at end of session. Sidelying to sitting EOB with CGA for trunk control. Pt is min A to don LSO while seated EOB. Pt requesting to bathe this session. Stand pivot transfer to w/c with rollator and CGA, max cueing for safe brake management. Pt is setup A for UB bathing and dressing at sink level. Sit to stand with CGA to sink. Pt is min A for LB clothing management and setup A for LB bathing. Provided no-rinse shower cap for patient and she is able to wash and comb out her hair with setup A. Setup A for oral hygiene while seated in w/c at sink. Ambulation 2 x 50 ft with rollator and CGA for balance. Pt exhibits decreased tolerance for gait training this date due to pain. Pt returned to bed at end of session, sit to sidelying with CGA. Pt left in L sidelying in bed with needs in reach, bed alarm in place.  Therapy Documentation Precautions:  Precautions Precautions: Back Precaution Comments: LSO Required Braces or Orthoses: Spinal Brace Spinal Brace: Lumbar corset,Applied in sitting position Restrictions Weight Bearing Restrictions: No   Therapy/Group: Individual Therapy   Excell Seltzer, PT, DPT  05/19/2020,  12:18 PM

## 2020-05-19 NOTE — Patient Care Conference (Signed)
Inpatient RehabilitationTeam Conference and Plan of Care Update Date: 05/18/2020   Time: 11:22 AM    Patient Name: Danielle Thomas Record Number: [id number]  Date of Birth: [birth year] Sex: Female         Room/Bed: 4W17C/4W17C-01 Payor Info: Payor: [employer] MEDICARE / Plan: [place] HMO / Product Type: *No Product type* /    Admit Date/Time:  05/13/2020  3:03 PM  Primary Diagnosis:  Compression fracture of body of thoracic vertebra [facility])  Hospital Problems: Principal Problem:   Compression fracture of body of thoracic vertebra [facility])    Expected Discharge Date: Expected Discharge Date: 05/26/20  Team Members Present: Physician leading conference: Dr. [name] Care Coodinator Present: Loralee Pacas, LCSWA;Lakayla Barrington Creig Hines, RN, BSN, [place] Nurse Present: Benjie Karvonen, RN PT Present: Excell Seltzer, PT OT Present: Roanna Epley, COTA;Jennifer Tamala Julian, OT PPS Coordinator present : Gunnar Fusi, SLP     Current Status/Progress Goal Weekly Team Focus  Bowel/Bladder   continent of B/B. last BM-1/10  remain cont  assess q shift and PRN   Swallow/Nutrition/ Hydration             ADL's   bathing-mod A; UB dressing-min A; LB dressing-mod A; toileting-max A; functional transfers-min A/CGA; fatigues quickly and requires ongoing encouragement  bathing-supervision; LB dressing-mod I; toilet transfers and toileting-mod I (LTG to bed downgraded to supervision overall with assistance for donning LSO and mod A for toileting)  education, activity tolerance, sfaety awarness, functional tranfsers, BADL training   Mobility   CGA bed mobility, CGA transfers with RW, gait up to 150 ft with RW CGA, min A stairs  mod I overall, will need to downgrade to Supervision  endurance, gait, pain management   Communication             Safety/Cognition/ Behavioral Observations            Pain   c/o back pain 8 of 10  pain< 3  assess pain q shift and PRN   Skin   back incisoin  remain free  from breakdown  assess skin q shift and PRN     Discharge Planning:  Pt to discharge to home alone with intermittent support from daughters. Pt will need to be Mod I at d/c.   Team Discussion: Patient needed Dilaudid x3 yesterday for pain. Continent B/B, she slept well last night, and plans to discharge home alone so she needs to discharged at Mod I. Patient on target to meet rehab goals: She is contact guard overall, walking 150' with RW, and working on getting the brace on/off by herself. She needs assist with toileting hygiene. Therapy is recommending that someone be with her for safety.  *See Care Plan and progress notes for long and short-term goals.   Revisions to Treatment Plan:    Teaching Needs: Family education, medication management, gait training, toilet transfers, wound/skin care, etc.  Current Barriers to Discharge: Decreased caregiver support, Home enviroment access/layout, Wound care, Weight bearing restrictions, Medication compliance and Behavior  Possible Resolutions to Barriers: Continue current medications, provide emotional support to patient and family.     Medical Summary Current Status: pain biggest limiter- changed to Dilaudid- prn; might change Oxycontin in next few days; slept better  Barriers to Discharge: Decreased family/caregiver support;Behavior;Home enviroment access/layout;Incontinence;Medical stability;Weight;Weight bearing restrictions;Wound care;Medication compliance;Insurance for SNF coverage  Barriers to Discharge Comments: loose stools since was so constipated; bruising/?MASD; needs to be Mod I- only intermittent support from daughters- needs supervision  Possible Resolutions to [employer] Focus: PT- biggest limiter pain; follows precautions- 134ft CGA RW; max goals supervision- can't make mod I per therapy; pt recognizes daughters won't help, but doesn't think can go home alone. needs minimum cues and hygiene and safety issues.  d/c  1/19-   Continued Need for Acute Rehabilitation Level of Care: The patient requires daily medical management by a physician with specialized training in physical medicine and rehabilitation for the following reasons: Direction of a multidisciplinary physical rehabilitation program to maximize functional independence : Yes Medical management of patient stability for increased activity during participation in an intensive rehabilitation regime.: Yes Analysis of laboratory values and/or radiology reports with any subsequent need for medication adjustment and/or medical intervention. : Yes   I attest that I was present, lead the team conference, and concur with the assessment and plan of the team.   [name] 05/19/2020, 11:00 AM

## 2020-05-19 NOTE — Progress Notes (Signed)
Nutrition Follow-up  RD working remotely.  DOCUMENTATION CODES:   Not applicable  INTERVENTION:   - Continue Ensure Enlive po BID, each supplement provides 350 kcal and 20 grams of protein  - Continue Magic Cup TID with meals, each supplement provides 290 kcal and 9 grams of protein  - Continue to encourage adequate PO intake  - Continue MVI with minerals daily  NUTRITION DIAGNOSIS:   Inadequate oral intake related to poor appetite as evidenced by per patient/family report,meal completion < 50%.  Progressing  GOAL:   Patient will meet greater than or equal to 90% of their needs  Progressing  MONITOR:   PO intake,Supplement acceptance,Labs,Weight trends,Skin,I & O's  REASON FOR ASSESSMENT:   Malnutrition Screening Tool    ASSESSMENT:   87 year old female with PMH of atrial fibrillation, HTN, restless leg syndrome. Pt with recent admission 03/27/20 for multiple electrolyte abnormalities. Pt was found to have T11 compression fracture which was managed with pain medication and a TLSO back brace. Pt was discharged to SNF at that time and was there for 2 weeks before returning home and has essentially been bedbound with difficulty ambulating. Presented 12/25 with increasing back pain. MRI thoracic lumbar spine showed progressed T11 compression fracture. Associated acute to subacute posterior left rib fractures 9 through 11. Pt underwent thoracic T10-11 arthrodesis allograft morsels percutaneous pedicle screws with fixation on 12/28. Admitted to CIR on 05/13/20.  Unable to reach pt via phone call to room.  Per [facility] documentation, pt has accepted 8 out of the last 11 Ensure doses. Magic Cups ordered with meals. Will continue with current oral nutrition supplement regimen.  Weight stable since RD initial assessment on 1/07. Will continue to monitor trends. Per RN edema assessment on 1/11, pt with non-pitting edema to BUE and BLE.  PO intake has improved from 25-35% to 25-80%  with dysphagia 3 diet downgrade.  Meal Completion: 25-80% x last 8 documented meals  Medications reviewed and include: Ensure Enlive BID, MVI with minerals, miralax, psyllium, warfarin  Labs reviewed.  Diet Order:   Diet Order            DIET DYS 3 Room service appropriate? Yes; Fluid consistency: Thin  Diet effective now                 EDUCATION NEEDS:   Not appropriate for education at this time  Skin:  Skin Assessment: Skin Integrity Issues: Incisions: back Other: MASD to coccyx  Last BM:  05/18/20 large type 6  Height:   Ht Readings from Last 1 Encounters:  05/13/20 5\' 3"  (1.6 m)    Weight:   Wt Readings from Last 1 Encounters:  05/18/20 72.8 kg    BMI:  Body mass index is 28.43 kg/m.  Estimated Nutritional Needs:   Kcal:  1600-1800  Protein:  80-95 grams  Fluid:  >/= 1.6 L    Gustavus Bryant, MS, RD, LDN Inpatient Clinical Dietitian Please see [facility] for contact information.

## 2020-05-19 NOTE — Progress Notes (Signed)
Occupational Therapy Session Note  Patient Details  Name: Danielle Thomas MRN: [id number] Date of Birth: 1928-04-16  Today's Date: 05/19/2020 OT Individual Time: 5465-6812 OT Individual Time Calculation (min): 70 min    Short Term Goals: Week 1:  OT Short Term Goal 1 (Week 1): Pt will complete LB dressing with min A and use of AE, prn. OT Short Term Goal 2 (Week 1): Pt will complete toileting task with min A OT Short Term Goal 3 (Week 1): Pt will complete bathing with min A OT Short Term Goal 4 (Week 1): Pt will engage in functional ADL/IADL in standing x2 minutes to increase endurance required for self-care tasks.  Skilled Therapeutic Interventions/Progress Updates:    Pt resting in bed upon arrival and agreeable to therapy. Cotreatment with Recreational Therapist. OT intervention with focus on bed mobility, sit<>stand, standing balance, functional amb with Rollator, standing balance, discharge planning, safety awareness, and activity tolerance to increase independence with BADLs. Pt donned LSO independently.Sit<>stand with CGA. Pt initially amb to Day Room but since it was too crowded, pt transported (for time management) to ADL apartment. Sit<>stand from couch (pt states she sits on couch at home) with min A and mod verbal cues for sequencing and technique. Pt stood with CGA and tapped beach ball back to Rec Therapist 15x2. Discussed home setup and discharge plans. Pt states she thinks arrangements are being made for a person to stay with her 24/7. Pt has BSC she keeps beside her bed. Pt amb with Rollator from ADL apartment to her room and returned to bed. Bed mobility with supervision. Pt remained in bed with all needs within reach and bed alarm activated.  Therapy Documentation Precautions:  Precautions Precautions: Back Precaution Comments: LSO Required Braces or Orthoses: Spinal Brace Spinal Brace: Lumbar corset,Applied in sitting position Restrictions Weight Bearing Restrictions:  No   Pain:  Pt stated that if she "lied" she would say it is 8/10 but it is really 10/10 back pain; RN admin meds and repositioned   Therapy/Group: Individual Therapy  Leroy Libman 05/19/2020, 12:23 PM

## 2020-05-20 ENCOUNTER — Inpatient Hospital Stay (HOSPITAL_COMMUNITY): Payer: Medicare HMO

## 2020-05-20 ENCOUNTER — Inpatient Hospital Stay (HOSPITAL_COMMUNITY): Payer: Medicare HMO | Admitting: *Deleted

## 2020-05-20 ENCOUNTER — Inpatient Hospital Stay (HOSPITAL_COMMUNITY): Payer: Medicare HMO | Admitting: Physical Therapy

## 2020-05-20 LAB — PROTIME-INR
INR: 2 — ABNORMAL HIGH (ref 0.8–1.2)
Prothrombin Time: 21.6 seconds — ABNORMAL HIGH (ref 11.4–15.2)

## 2020-05-20 MED ORDER — WARFARIN SODIUM 1 MG PO TABS
1.0000 mg | ORAL_TABLET | ORAL | Status: DC
  Administered 2020-05-20: 1 mg via ORAL
  Filled 2020-05-20: qty 1

## 2020-05-20 MED ORDER — HYDROMORPHONE HCL 2 MG PO TABS
4.0000 mg | ORAL_TABLET | Freq: Every day | ORAL | Status: DC
Start: 2020-05-21 — End: 2020-05-21
  Administered 2020-05-21: 4 mg via ORAL
  Filled 2020-05-20: qty 2

## 2020-05-20 MED ORDER — LATANOPROST 0.005 % OP SOLN: 1.0000 [drp] | Freq: Every day | OPHTHALMIC | 12 refills | Status: DC

## 2020-05-20 MED ORDER — POLYETHYLENE GLYCOL 3350 17 G PO PACK: 17.0000 g | PACK | Freq: Every day | ORAL | 0 refills | Status: DC

## 2020-05-20 MED ORDER — WARFARIN SODIUM 1 MG PO TABS
1.5000 mg | ORAL_TABLET | ORAL | Status: DC
  Filled 2020-05-20: qty 1

## 2020-05-20 MED ORDER — HYDROMORPHONE HCL 4 MG PO TABS: 4.0000 mg | ORAL_TABLET | ORAL | 0 refills | Status: DC | PRN

## 2020-05-20 MED ORDER — BISACODYL 10 MG RE SUPP: 10.0000 mg | Freq: Every day | RECTAL | 0 refills | Status: DC | PRN

## 2020-05-20 MED ORDER — ROPINIROLE HCL 0.25 MG PO TABS: 0.2500 mg | ORAL_TABLET | Freq: Every day | ORAL | Status: DC | PRN

## 2020-05-20 MED ORDER — FENTANYL 25 MCG/HR TD PT72: 1.0000 | MEDICATED_PATCH | TRANSDERMAL | 0 refills | Status: DC

## 2020-05-20 MED ORDER — HYDROMORPHONE HCL 2 MG PO TABS
4.0000 mg | ORAL_TABLET | ORAL | Status: DC | PRN
  Administered 2020-05-20 – 2020-05-21 (×6): 4 mg via ORAL
  Filled 2020-05-20 (×6): qty 2

## 2020-05-20 MED ORDER — SENNA 8.6 MG PO TABS
1.0000 | ORAL_TABLET | Freq: Two times a day (BID) | ORAL | Status: DC
  Administered 2020-05-20 – 2020-05-21 (×3): 8.6 mg via ORAL
  Filled 2020-05-20 (×2): qty 1

## 2020-05-20 MED ORDER — SORBITOL 70 % SOLN
30.0000 mL | Freq: Once | Status: AC
  Administered 2020-05-20: 30 mL via ORAL
  Filled 2020-05-20: qty 30

## 2020-05-20 MED ORDER — PSYLLIUM 95 % PO PACK: 1.0000 | PACK | Freq: Every day | ORAL | Status: DC

## 2020-05-20 NOTE — Progress Notes (Signed)
Physical Therapy Session Note  Patient Details  Name: Danielle Thomas MRN: [id number] Date of Birth: 1928/01/11  Today's Date: 05/20/2020 PT Individual Time: 0800-0850 PT Individual Time Calculation (min): 50 min   Short Term Goals: Week 1:  PT Short Term Goal 1 (Week 1): Patient to perform bed mobility mod I. PT Short Term Goal 2 (Week 1): Patient to perform sit to stand with S. PT Short Term Goal 3 (Week 1): Patient to ambulation 150' with S to occasioinal CGA with RW. PT Short Term Goal 4 (Week 1): Patient to negotiate 12 steps with CGA with rails.  Skilled Therapeutic Interventions/Progress Updates: Pt presented in bed agreeable to therapy. Pt stating significant pain 9/10 with mobility, pre-medicated approx 30 min earlier. Pt also requesting to use bathroom but frustrated that she has been unable to have a BM. Rest breaks taken as needed for pain management. Performed supine to sit at EOB via log roll and use of bed features with supervision and increased time. Performed ambulatory transfer to toilet with rollator with supervision for ambulation and transfers with verbal cues to apply brakes prior to sitting on toilet (+urinary void). Pt then transferred to w/c and transported to rehab gym for pain management and energy conservation. Participated in ascending/descending x 8 steps with B rails and step to pattern with close supervision. Participated in obstacle course with rollator in hallway weaving through cones and picking up beanbags with reacher. Upon return to w/c pt complains of 10/10 pain which subsided to 9/10 with seated rest. After extended rest period pt indicating no relief of pain. Pt was able to ambulate back to room with rollator and close supervision. Pt returned to bed with CGA and increased time but was able to bring BLE onto bed. Pt left in bed at end of session in sidelying with Kpack with bed alarm on, call bell within reach and current needs met. Pt missed 83mn of skilled  therapy due to pain.     Therapy Documentation Precautions:  Precautions Precautions: Back Precaution Comments: LSO Required Braces or Orthoses: Spinal Brace Spinal Brace: Lumbar corset,Applied in sitting position Restrictions Weight Bearing Restrictions: No General: PT Amount of Missed Time (min): 10 Minutes PT Missed Treatment Reason: Pain Vital Signs: Therapy Vitals Temp: (!) 97.3 F (36.3 C) Temp Source: Oral Pulse Rate: 75 Resp: 16 BP: 131/63 Patient Position (if appropriate): Lying Oxygen Therapy SpO2: 98 % O2 Device: Room Air   Therapy/Group: Individual Therapy  Solara Goodchild  Jajaira Ruis, PTA  05/20/2020, 4:14 PM

## 2020-05-20 NOTE — Progress Notes (Signed)
PHYSICAL MEDICINE & REHABILITATION PROGRESS NOTE   Subjective/Complaints:  Pt reports having a lot of gas, but no BM in "days"- appears to be 2 days- but doesn't want blow out-   Miserable due to pain- cannot get comfortable and says regimen isn't working- it does take 3 days for Duragesic to kick in completely.   Says OK if won't move, but has to move for therapy, obviously.   Also called me back to the room to ask if could stay longer- has more goals to accomplish- scheduled to leave next week on 1/19- explained will need to discuss with therapy.    ROS:   Pt denies SOB, abd pain, CP, N/V/C/D, and vision changes   Objective:   No results found. No results for input(s): WBC, HGB, HCT, PLT in the last 72 hours. Recent Labs    05/18/20 0534  NA 137  K 4.2  CL 106  CO2 23  GLUCOSE 97  BUN 10  CREATININE 0.79  CALCIUM 8.3*    Intake/Output Summary (Last 24 hours) at 05/20/2020 1117 Last data filed at 05/20/2020 0700 Gross per 24 hour  Intake 437 ml  Output -  Net 437 ml        Physical Exam: Vital Signs Blood pressure (!) 144/80, pulse 95, temperature 97.9 F (36.6 C), resp. rate 16, height 5\' 3"  (1.6 m), weight 73 kg, SpO2 94 %.   Constitutional: laying in bed- appropriate, but upset about pain, PT, in room, NAD HEENT: EOMI, oral membranes moist Neck: supple Cardiovascular:irregular- rate controlled Respiratory/Chest: CTA B/L- no W/R/R- good air movement GI/Abdomen: Soft, NT, ND, (+)BS  Ext: no clubbing, cyanosis, or edema Psych: a little grumpy still, but cordial Musculoskeletal: TTP over middle/low back, poor sitting posture    Comments: UEs 5/5 in deltoids, biceps, triceps, WE grip and finger abd B/L LEs- 4/5 HF, 5-/5 in KE, DF and PF B/L  Skin: still has a very large dark purple bruise across entire upper back- incision for surgery underneath it- basically healed over- no drainage, no erythema Neurological: Ox3- very HOH         Assessment/Plan: 1. Functional deficits which require 3+ hours per day of interdisciplinary therapy in a comprehensive inpatient rehab setting.  Physiatrist is providing close team supervision and 24 hour management of active medical problems listed below.  Physiatrist and rehab team continue to assess barriers to discharge/monitor patient progress toward functional and medical goals  Care Tool:  Bathing    Body parts bathed by patient: Chest,Front perineal area,Buttocks,Face   Body parts bathed by helper: Buttocks,Left lower leg,Right lower leg     Bathing assist Assist Level: Set up assist     Upper Body Dressing/Undressing Upper body dressing   What is the patient wearing?: Pull over shirt    Upper body assist Assist Level: Set up assist    Lower Body Dressing/Undressing Lower body dressing      What is the patient wearing?: Pants,Incontinence brief     Lower body assist Assist for lower body dressing: Minimal Assistance - Patient > 75%     Toileting Toileting    Toileting assist Assist for toileting: Maximal Assistance - Patient 25 - 49%     Transfers Chair/bed transfer  Transfers assist     Chair/bed transfer assist level: Contact Guard/Touching assist     Locomotion Ambulation   Ambulation assist      Assist level: Contact Guard/Touching assist Assistive device: Rollator Max distance: 55'  Walk 10 feet activity   Assist     Assist level: Contact Guard/Touching assist Assistive device: Rollator   Walk 50 feet activity   Assist    Assist level: Contact Guard/Touching assist Assistive device: Rollator    Walk 150 feet activity   Assist Walk 150 feet activity did not occur: Safety/medical concerns  Assist level: Contact Guard/Touching assist Assistive device: Rollator    Walk 10 feet on uneven surface  activity   Assist     Assist level: Minimal Assistance - Patient > 75% Assistive device: Horticulturist, commercial Will patient use wheelchair at discharge?: No Type of Wheelchair: Manual    Wheelchair assist level: Supervision/Verbal cueing Max wheelchair distance: 130'    Wheelchair 50 feet with 2 turns activity    Assist        Assist Level: Supervision/Verbal cueing   Wheelchair 150 feet activity     Assist  Wheelchair 150 feet activity did not occur: Safety/medical concerns       Blood pressure (!) 144/80, pulse 95, temperature 97.9 F (36.6 C), resp. rate 16, height 5\' 3"  (1.6 m), weight 73 kg, SpO2 94 %.  Medical Problem List and Plan: 1.  Decreased functional mobility secondary to thoracic T11 compression fracture.  Status post T10-11 arthrodesis percutaneous pedicle screw 05/04/2020.  Back brace as directed             -patient may  Shower- be careful with back brace             -ELOS/Goals:  10-14 days- mod I  1/13- d/c date 1/19 currently- pt asking "for a few more days"- will d/w therapy 2.  Antithrombotics: -DVT/anticoagulation: Chronic Coumadin 1/8- INR per pharmacy 1/11- INR 2.2- per pharmacy             -antiplatelet therapy: N/A 3. Pain Management: OxyContin 10 mg every 12 hours, Percocet 5/325 mg q4 hours prn, Valium as needed muscle spasms.  Monitor mental status esp due to her age. Will verify meds with pharmacy which I did since some meds were changed.   1/7- changed pain meds yesterday- pain doing better- con't regimen   1/9 added kpad for back spasms which pt says has helped  1/10- changed oxy prn to Dilaudid 2 mg q4 hours prn- con't Oxycontin 10 mg BID- she said "oxy wasn't working".  1/11- reports pain doing better than on  Oxycodone- con't dilaudid and Oxycontin for now.    1/12- changed Oxycontin to Duragesic 25 mcg q3 days- and scheduled AM dilaudid at 6am- otherwise prn.  1/13- changed Dilaudid to 4 mg q4 hours prn and at 6am daily- since takes a few days for Duragesic to hit maximal effectiveness- Monitor for  oversedation!  4. Mood: Provide emotional support             -antipsychotic agents: N/A 5. Neuropsych: This patient is capable of making decisions on her own behalf. 6. Skin/Wound Care: Routine skin checks 7. Fluids/Electrolytes/Nutrition: Routine in and outs with follow-up chemistries 8.  E. coli UTI.  Completed course of Keflex 9.  Hypertension.  Cozaar 50 mg daily.  Monitor with increased mobility  1/8- BP controlled- con't regimen  1/10- BP somewhat elevated this AM 150s/99- since had been doing well- will monitor- no changes yet 10.  Atrial fibrillation.  Coumadin as directed.  Cardiac rate controlled  1/8- rate in 90's. con't regimen  1/13- rate controlled- con't regimen 11.  Restless leg  syndrome.  Requip 0.25 mg in AM prn and 0.5 mg every evening at 6pm- that was her home dose  1/7- restless leg syndrome doing better with changes- con't regimen 12.  Constipation.  Senokot-S twice daily, MiraLAX twice daily, Dulcolax tablet/ suppository daily as needed- will add Sorbitol x1 today - (only 1 tiny pebble last 3x).   1/7- will give another dose of Sorbitol 60 G this afternoon and if doesn't go, needs mg citrate over weekend- on a lot of bowel meds- but needs ot have BM- has been 5 days.   1/8- added metamucil to bid miralax       -increase senna to 2 twice daily (didn't receive last two doses however)  1/9 still no bm, she is eating a little bit   -try mg citrate today with SSE if needed  1/10- pt didn't have BM from before rehab admission to Sunday evening- then upset because she had at least 5 BMs due to severe constipation- however has a different memory of when went/had BM than the chart shows- she went without BM for 5-7 days (had 2 tiny small pellet BMs during that time, but that isn't a BM)- will hold senokot 2 tabs BID for now- and monitor  1/11- had multiple accidents in last 24 hours- off Senokot completely- also decreased miralax today to 1 dose/day- con't metmucil to bulk up  stools.   1/12- not sure if had another blowout yesterday- off miralax and senokot for now- but will restart senokot tomorrow if no BM.   1/13- will restart Senokot esp with pain meds and give smaller dose of Sorbitol.  13. Poor appetite- could be poor due to constipation will monitor-   -discussed intake with patient   -RD is following    LOS: 7 days A FACE TO FACE EVALUATION WAS PERFORMED  Zafiro Routson 05/20/2020, 11:17 AM

## 2020-05-20 NOTE — Discharge Summary (Addendum)
Physician Discharge Summary  Patient ID: Danielle Thomas MRN: [id number] DOB/AGE: 1927/12/28 87 y.o.  Admit date: 05/13/2020 Discharge date: 05/21/2020  Discharge Diagnoses:  Principal Problem:   Compression fracture of body of thoracic vertebra (HCC) Atrial fibrillation E. coli UTI Hypertension Restless leg syndrome Constipation  Discharged Condition: Stable  Significant Diagnostic Studies: DG Ribs Unilateral W/Chest Right  Result Date: 05/01/2020 CLINICAL DATA:  Rib and back pain, recent T11 fracture EXAM: RIGHT RIBS AND CHEST - 3+ VIEW COMPARISON:  11/26/2017 FINDINGS: No displaced fracture or other bone lesions are seen involving the ribs. There is no evidence of pneumothorax or pleural effusion. Cardiomegaly. No acute appearing airspace opacity. Surgical clips and suture project over the right lung base. IMPRESSION: 1. No displaced rib fracture or other acute abnormality of the right ribs. No pneumothorax or pleural effusion. 2. The thoracic spine is not well imaged on frontal and oblique views provided per report of recent T11 fracture. 3. Cardiomegaly. 4. No acute appearing airspace opacity. Electronically Signed   By: Eddie Candle M.D.   On: 05/01/2020 14:54   DG Thoracic Spine 2 View  Result Date: 05/04/2020 CLINICAL DATA:  Surgery, elective. Additional history provided: Thoracic 9-thoracic 12 Percutaneous pedicle screws. Provided fluoroscopy time 4 minutes, 24 seconds (110.22 mGy). EXAM: THORACIC SPINE 2 VIEWS; DG C-ARM 1-60 MIN COMPARISON:  Thoracic spine MRI 05/01/2020. FINDINGS: Intraoperative fluoroscopic images of the lower thoracic spine are submitted, 2 images total. Redemonstrated T11 compression fracture. A posterior spinal fusion construct (utilizing pedicle screws and vertical interconnecting rods) spans the T9-T12 levels. No pedicle screw is present on the right at T11. IMPRESSION: Two intraoperative fluoroscopic images of the lower thoracic spine, as described.  Electronically Signed   By: Kellie Simmering DO   On: 05/04/2020 12:30   MR THORACIC SPINE WO CONTRAST  Result Date: 05/01/2020 CLINICAL DATA:  87 year old female with recent T11 fracture. Rib and back pain. EXAM: MRI THORACIC AND LUMBAR SPINE WITHOUT CONTRAST TECHNIQUE: Multiplanar and multiecho pulse sequences of the thoracic and lumbar spine were obtained without intravenous contrast. COMPARISON:  Rib radiographs earlier 03/27/2020. Today. CT Abdomen and Pelvis FINDINGS: MRI THORACIC SPINE FINDINGS Limited cervical spine imaging: Previous cervical ACDF, appears to be at the C4-C5 levels. Thoracic spine segmentation: Appears to be normal aside from hypoplastic ribs at T12, as suggested on CT Abdomen and Pelvis last month. Alignment: Normal thoracic vertebral height and alignment above the T11 level, see below. Vertebrae: Thoracic vertebrae T1 through T8 are intact with normal marrow signal. T9 vertebral body appears intact but there is an acute to subacute fracture of the posterior left 9th rib with marrow edema (series 17, image 17 and series 21, image 30). T10 vertebra appears intact but there is a fracture of the posterior left 10th rib (same image on series 17 and series 21, image 33). Progress T11 compression fracture since last month with comminution, confluent marrow edema, vertebral loss of height now up to 40% (previously less than 20%). New retropulsion of bone (series 17, image 10) with mild spinal stenosis (series 21, image 37). Mild associated spinal cord mass effect (series 20, image 37 and series 29, image 2). No spinal cord edema or myelomalacia suspected. Moderate to severe left and moderate right T11 foraminal stenosis also is increased. Superimposed left posterior levin through fracture suspected. The T12 vertebra appears intact with superimposed benign vertebral body hemangioma. Cord: Capacious thoracic spinal canal above T11. See T11 spinal stenosis and cord mass effect details above. No  definite  cord signal abnormality. Conus medullaris is at T12-L1. Paraspinal and other soft tissues: Left paraspinal soft tissue edema from the 9th rib through T12 vertebral level. No drainable paraspinal fluid collection. Trace bilateral layering pleural effusions. Small round right lung base fat density nodule appears stable from last month. Disc levels: No age advanced degenerative changes superimposed on the acute osseous abnormalities detailed above. MRI LUMBAR SPINE FINDINGS Segmentation:  Normal. Alignment: Moderate levoconvex lumbar scoliosis appears stable from last month. Stable lumbar vertebral height and alignment. Vertebrae: No marrow edema or evidence of acute osseous abnormality in the lumbar spine. Intact visible sacrum and SI joints. Conus medullaris and cauda equina: Conus extends to the T12-L1 level. Conus and cauda equina appear normal. Paraspinal and other soft tissues: Left posterolateral lower thoracic paraspinal soft tissue edema but no acute lumbar paraspinal soft tissue finding. Chronic lumbar paraspinal muscle atrophy. Visible abdominal viscera appear stable since last month. Disc levels: Advanced lumbar spine degeneration in the setting of levoconvex scoliosis. Up to mild associated lumbar spinal stenosis. IMPRESSION: MR THORACIC SPINE IMPRESSION 1. Progressed T11 compression fracture since last month. Increased comminution. Loss of height increased from 20% to 40%. And new retropulsion of bone resulting in mild spinal stenosis with mild cord mass effect but no spinal cord signal abnormality. 2. Associated acute to subacute posterior left rib fractures 9 through 11. 3. Left paraspinal soft tissue edema from T9 to T12. 4. Trace layering pleural effusions. MR LUMBAR SPINE IMPRESSION 1. No acute osseous abnormality in the lumbar spine. 2. Advanced lumbar spine degeneration the setting of moderate levoconvex lumbar scoliosis. Electronically Signed   By: Genevie Ann M.D.   On: 05/01/2020 17:50    MR LUMBAR SPINE WO CONTRAST  Result Date: 05/01/2020 CLINICAL DATA:  87 year old female with recent T11 fracture. Rib and back pain. EXAM: MRI THORACIC AND LUMBAR SPINE WITHOUT CONTRAST TECHNIQUE: Multiplanar and multiecho pulse sequences of the thoracic and lumbar spine were obtained without intravenous contrast. COMPARISON:  Rib radiographs earlier 03/27/2020. Today. CT Abdomen and Pelvis FINDINGS: MRI THORACIC SPINE FINDINGS Limited cervical spine imaging: Previous cervical ACDF, appears to be at the C4-C5 levels. Thoracic spine segmentation: Appears to be normal aside from hypoplastic ribs at T12, as suggested on CT Abdomen and Pelvis last month. Alignment: Normal thoracic vertebral height and alignment above the T11 level, see below. Vertebrae: Thoracic vertebrae T1 through T8 are intact with normal marrow signal. T9 vertebral body appears intact but there is an acute to subacute fracture of the posterior left 9th rib with marrow edema (series 17, image 17 and series 21, image 30). T10 vertebra appears intact but there is a fracture of the posterior left 10th rib (same image on series 17 and series 21, image 33). Progress T11 compression fracture since last month with comminution, confluent marrow edema, vertebral loss of height now up to 40% (previously less than 20%). New retropulsion of bone (series 17, image 10) with mild spinal stenosis (series 21, image 37). Mild associated spinal cord mass effect (series 20, image 37 and series 29, image 2). No spinal cord edema or myelomalacia suspected. Moderate to severe left and moderate right T11 foraminal stenosis also is increased. Superimposed left posterior levin through fracture suspected. The T12 vertebra appears intact with superimposed benign vertebral body hemangioma. Cord: Capacious thoracic spinal canal above T11. See T11 spinal stenosis and cord mass effect details above. No definite cord signal abnormality. Conus medullaris is at T12-L1.  Paraspinal and other soft tissues: Left paraspinal soft tissue  edema from the 9th rib through T12 vertebral level. No drainable paraspinal fluid collection. Trace bilateral layering pleural effusions. Small round right lung base fat density nodule appears stable from last month. Disc levels: No age advanced degenerative changes superimposed on the acute osseous abnormalities detailed above. MRI LUMBAR SPINE FINDINGS Segmentation:  Normal. Alignment: Moderate levoconvex lumbar scoliosis appears stable from last month. Stable lumbar vertebral height and alignment. Vertebrae: No marrow edema or evidence of acute osseous abnormality in the lumbar spine. Intact visible sacrum and SI joints. Conus medullaris and cauda equina: Conus extends to the T12-L1 level. Conus and cauda equina appear normal. Paraspinal and other soft tissues: Left posterolateral lower thoracic paraspinal soft tissue edema but no acute lumbar paraspinal soft tissue finding. Chronic lumbar paraspinal muscle atrophy. Visible abdominal viscera appear stable since last month. Disc levels: Advanced lumbar spine degeneration in the setting of levoconvex scoliosis. Up to mild associated lumbar spinal stenosis. IMPRESSION: MR THORACIC SPINE IMPRESSION 1. Progressed T11 compression fracture since last month. Increased comminution. Loss of height increased from 20% to 40%. And new retropulsion of bone resulting in mild spinal stenosis with mild cord mass effect but no spinal cord signal abnormality. 2. Associated acute to subacute posterior left rib fractures 9 through 11. 3. Left paraspinal soft tissue edema from T9 to T12. 4. Trace layering pleural effusions. MR LUMBAR SPINE IMPRESSION 1. No acute osseous abnormality in the lumbar spine. 2. Advanced lumbar spine degeneration the setting of moderate levoconvex lumbar scoliosis. Electronically Signed   By: Genevie Ann M.D.   On: 05/01/2020 17:50   DG C-Arm 1-60 Min  Result Date: 05/04/2020 CLINICAL DATA:   Surgery, elective. Additional history provided: Thoracic 9-thoracic 12 Percutaneous pedicle screws. Provided fluoroscopy time 4 minutes, 24 seconds (110.22 mGy). EXAM: THORACIC SPINE 2 VIEWS; DG C-ARM 1-60 MIN COMPARISON:  Thoracic spine MRI 05/01/2020. FINDINGS: Intraoperative fluoroscopic images of the lower thoracic spine are submitted, 2 images total. Redemonstrated T11 compression fracture. A posterior spinal fusion construct (utilizing pedicle screws and vertical interconnecting rods) spans the T9-T12 levels. No pedicle screw is present on the right at T11. IMPRESSION: Two intraoperative fluoroscopic images of the lower thoracic spine, as described. Electronically Signed   By: Kellie Simmering DO   On: 05/04/2020 12:30    Labs:  Basic Metabolic Panel: Recent Labs  Lab 05/14/20 0519 05/18/20 0534  NA 134* 137  K 4.0 4.2  CL 103 106  CO2 23 23  GLUCOSE 103* 97  BUN 11 10  CREATININE 0.69 0.79  CALCIUM 8.1* 8.3*    CBC: Recent Labs  Lab 05/14/20 0519 05/16/20 0511  WBC 8.2 7.4  NEUTROABS 6.4  --   HGB 11.8* 11.9*  HCT 36.3 35.3*  MCV 91.7 90.1  PLT 239 261    CBG: No results for input(s): GLUCAP in the last 168 hours.   Brief HPI:   Danielle Thomas is a 87 y.o. right-handed female with history of atrial fibrillation maintained on Coumadin hypertension, restless leg syndrome.  Patient lives alone two-level home bed and bath main level.  Used a Radiation protection practitioner for mobility.  Patient was still driving grocery shopping.  Recent admission 03/27/2020 to 03/29/2020 for multiple electrolyte imbalances.  She was found to have T11 compression fracture which was managed with pain medication and a TLSO back brace.  She was discharged to skilled nursing facility at that time.  She was in skilled nursing facility for 2 weeks before returning home and essentially been bedbound with difficulty  in ambulation.  Presented 05/01/2020 with increasing back pain.  Admission chemistries unremarkable except sodium  134 potassium 2.9 BUN 22 hemoglobin 16.9 urine culture greater 100,000 E. coli.  MRI thoracic lumbar spine showed progressed T11 compression fracture since last months increased comminution.  Loss of height increased from 20 to 40% and new retropulsion of bone resulting in mild spinal stenosis with mild cord mass-effect but no spinal cord signal abnormality.  Associated acute to subacute posterior left rib fractures 9 through 11.  Left paraspinal soft tissue edema from T9-T12.  Patient underwent thoracic T10-11 arthrodesis allograft morsels percutaneous pedicle screw fixation 05/04/2020 per Dr. Christella Noa.  She continue with back brace.  Her chronic Coumadin was resumed.  She completed course of Keflex for E. coli UTI.  Due to patient decrease in functional mobility she was admitted for a comprehensive rehab program.   Hospital Course: Danielle Thomas was admitted to rehab 05/13/2020 for inpatient therapies to consist of PT, ST and OT at least three hours five days a week. Past admission physiatrist, therapy team and rehab RN have worked together to provide customized collaborative inpatient rehab.  Pertaining to patient's thoracic T11 compression fracture status post T-11 arthrodesis percutaneous pedicle screw fixation 05/04/2020.  Back brace as directed.  She would follow-up with neurosurgery.  She remained on chronic Coumadin no bleeding episodes for atrial fibrillation cardiac rate controlled.  Pain management use of a Duragesic patch as well as Dilaudid 4 mg every 4 hours as needed.,  She had completed a course of Keflex for E. coli UTI no dysuria hematuria.  Blood pressure controlled monitored on Cozaar.  Restless leg syndrome with Requip as directed.  Bouts of constipation resolved with laxative assistance.   Blood pressures were monitored on TID basis and controlled    Rehab course: During patient's stay in rehab weekly team conferences were held to monitor patient's progress, set goals and discuss  barriers to discharge. At admission, patient required.  Minimal guard sit to stand, minimal assist 20 feet rolling walker.  Physical exam.  Blood pressure 147/83 pulse 78 temperature 98.1 respiration 20 ox saturation 94% room air Constitutional.  No acute distress HEENT Head.  Normocephalic and atraumatic Neck.  Supple nontender no JVD without thyromegaly Cardiac regular rate rhythm no extra sounds or murmur heard Abdomen.  Soft nontender positive bowel sounds without rebound Respiratory effort normal no respiratory distress without wheeze Musculoskeletal.  No rigidity Comments.  Upper extremities 5/5 deltoids biceps triceps wrist extension grip finger abduction bilaterally Lower extremities 4+/5 hip flexors 5 -/5 knee extension dorsiflexion plantarflexion bilaterally Skin.  Back incision dressed clean and dry Neurologic.  Alert oriented x3  He/She  has had improvement in activity tolerance, balance, postural control as well as ability to compensate for deficits. He/She has had improvement in functional use RUE/LUE  and RLE/LLE as well as improvement in awareness.  Perform supine to sit supervision.  Ambulates to the day room without rest break contact-guard assist.  Participated in seated standing therapies supervision.  Ambulates back to her room supervision.  She can don her brace independently sit to stand contact-guard assist.  He did some basic assist for lower body ADLs.  Plan is for skilled nursing facility placement due to limited assistance at home       Disposition: Discharge to skilled nursing facility    Diet: Mechanical soft  Special Instructions: No driving smoking or alcohol  Back brace when out of bed  Patient should have weekly prothrombin times while  on chronic Coumadin with INR 2.0-3.0  Medications at discharge 1.  Dulcolax tablet 5 mg daily as needed constipation 2.  Dulcolax suppository 10 mg daily as needed rectally constipation 3.  Trusopt ophthalmic  solution 2% 1 drop both eyes twice daily 4.  Duragesic patch 25 mcg change every 72 hours 5.  Dilaudid 4 mg every 4 hours as needed pain 6.Xalatan ophthalmic solution 0.05% 1 drop both eyes at bedtime 7.  Cozaar 50 mg p.o. daily 8.  Multivitamin daily 9.  MiraLAX daily hold for loose stools 10.  Metamucil 1 packet daily 11.  Requip 0.25 mg daily as needed restless legs 12.  Coumadin 1 mg Tuesdays Thursdays and Saturdays 1.5 mg Monday Wednesday Friday and Sunday  30-35 minutes were spent completing discharge summary and discharge planning     Follow-up Information    Lovorn, Jinny Blossom, MD Follow up.   Specialty: Physical Medicine and Rehabilitation Why: Office to call for appointment Contact information: [address] [place] [address] [phone]        Ashok Pall, MD Follow up.   Specialty: Neurosurgery Why: Call for appointment Contact information: [address] [place] [address] [phone]        Cari Caraway, MD Follow up.   Specialty: Family Medicine Contact information: [place] [address] [phone]               Signed: Lavon Paganini [place] 05/20/2020, 1:47 PM

## 2020-05-20 NOTE — Progress Notes (Signed)
Physical Therapy Session Note  Patient Details  Name: Danielle Thomas MRN: [id number] Date of Birth: 1927/10/23  Today's Date: 05/20/2020 PT Individual Time: 1000-1100 PT Individual Time Calculation (min): 60 min   Short Term Goals: Week 1:  PT Short Term Goal 1 (Week 1): Patient to perform bed mobility mod I. PT Short Term Goal 2 (Week 1): Patient to perform sit to stand with S. PT Short Term Goal 3 (Week 1): Patient to ambulation 150' with S to occasioinal CGA with RW. PT Short Term Goal 4 (Week 1): Patient to negotiate 12 steps with CGA with rails. Week 2:    Week 3:     Skilled Therapeutic Interventions/Progress Updates:    PAIN none at rest,  c/o back and RLE pain w/activity but did not quantify.  Rest breaks and stretching performed.  Pt initially supine and agreeable to treatment session.  States "I don't want to, but I know I need to".  Supine to r side to sit w/min assist.  LSO donned w/min assist/set up.  Pt sits several min w cg and performs bilat ankle pumps as warm up activity.  Sit to stand to rollator w/cga.  Gait 126ft w/cga demonstrates safe use of rollator.   Pt requested therapist move recliner to R side of bed, offers to try sitting up for a while if I will do this.  Therapist moves item as directed by patient to facilatate sitting oob today.  Sit to stand w/cga and gait x 17ft w/cga Seated postural exercised including scapular retraction 2x15, scapular retraction w/shoulders in 90/90 position, and overhead reach 2 x15 each. Seated piriformis stretching and hamdstring stretching 30- 45 sec x 2 each, performed bilat.   Gt 150 w/rollator/RW cga to assist, turn/sit to wc w/min assist due to decreased control w/descent w/fatigue.    Commode transfer including short distance gait w/rollator and cga only.  Able to manage clothing w/cga but total assist w/hygiene.  Short distance gait to recliner and cga w/rollator.  Pt takes additional time for comfortable positioning.   Pt left oob in recliner w/chair alarm set and needs in reach.    Therapy Documentation Precautions:  Precautions Precautions: Back Precaution Comments: LSO Required Braces or Orthoses: Spinal Brace Spinal Brace: Lumbar corset,Applied in sitting position Restrictions Weight Bearing Restrictions: No  Therapy/Group: Individual Therapy  Callie Fielding, [place] 05/20/2020, 12:28 PM

## 2020-05-20 NOTE — Discharge Instructions (Signed)
Inpatient Rehab Discharge Instructions  TAMSYN OWUSU Discharge date and time: No discharge date for patient encounter.   Activities/Precautions/ Functional Status: Activity: Back brace when out of bed Diet: regular diet Wound Care: Routine skin checks Functional status:  ___ No restrictions     ___ Walk up steps independently ___ 24/7 supervision/assistance   ___ Walk up steps with assistance ___ Intermittent supervision/assistance  ___ Bathe/dress independently ___ Walk with walker     _x__ Bathe/dress with assistance ___ Walk Independently    ___ Shower independently ___ Walk with assistance    ___ Shower with assistance ___ No alcohol     ___ Return to work/school ________  Special Instructions: No driving smoking or alcohol     My questions have been answered and I understand these instructions. I will adhere to these goals and the provided educational materials after my discharge from the hospital.  Patient/Caregiver Signature _______________________________ Date __________  Clinician Signature _______________________________________ Date __________  Please bring this form and your medication list with you to all your follow-up doctor's appointments.

## 2020-05-20 NOTE — Progress Notes (Signed)
Patient ID: Danielle Thomas, female   DOB: 1928/02/08, 87 y.o.   MRN: [id number]  SW met with pt in room to discuss SNF list received by family and to confirm if she remains in agreement. Pt reports she is allowing her dtr [name] to manage this area. SW provided pt with SNF list and reviewed with pt. While pt remains reluctant about SNF-short term rehab, she is amenable. SW called pt dtr [name] [phone]) to inform on above. SW informed will explore SNF locations and will provide updates. SW also informed that if pt is accepted, she could possibly leave earlier than current d/c date. SW informed there will be updates once there is more information.   Preferred SNF locations include: 1. Nikki/ Admissions with [facility] [phone]) to discuss referral. SW waiting on follow-up.  *Reports able to extend bed offer. Will explore if pt has any SNF days left; pt may possibly be in her co-pay days. SW spoke with Naomi/Representtaive with [facility] [phone]) who submitted request for clinical review. Ref ID #[id number]. States a nurse will call back to request clinicals. 2. [facility] [phone] 3. [place] at [facility] [phone]) 4. [facility] [phone]) 5. [facility] [place] [phone])   Loralee Pacas, MSW, [place] Office: [phone] Cell: [phone] Fax: [phone]

## 2020-05-20 NOTE — Progress Notes (Signed)
Recreational Therapy Session Note  Patient Details  Name: Danielle Thomas MRN: [id number] Date of Birth: Apr 06, 1928 Today's Date: 05/20/2020  OTA informed LRT that pt had attempted to stay up for therapies but missed OT session due to pain.  Upon arrival, pt sleeping soundly, did not awaken.  [place] 05/20/2020, 3:51 PM

## 2020-05-20 NOTE — Progress Notes (Signed)
Occupational Therapy Note  Patient Details  Name: Danielle Thomas MRN: [id number] Date of Birth: 03-Aug-1927  Today's Date: 05/20/2020 OT Missed Time: 83 Minutes Missed Time Reason: Patient fatigue;Pain  Pt resting in bed upon arrival with RN present. Pt stated she "tried to stay up" until therapy but the pain was too much. RN administering Sorbitol for bowels. Pt stated she was in "too much pain" to do anything but was agreeable to therapist checking back. Pt asleep when therapist checked back. Pt missed 60 mins skilled OT services.   Leotis Shames [facility] 05/20/2020, 2:19 PM

## 2020-05-20 NOTE — Progress Notes (Signed)
ANTICOAGULATION CONSULT NOTE  Pharmacy Consult:  Coumadin Indication: atrial fibrillation  Allergies  Allergen Reactions  . Amlodipine Besylate     Other reaction(s): high dose make feet swell  . Codeine Nausea And Vomiting  . Meperidine Nausea Only  . Metoprolol Succinate [Metoprolol]     Other reaction(s): effects her breathing  . Oxycodone Nausea And Vomiting    Patient Measurements: Height: 5\' 3"  (160 cm) Weight: 73 kg (160 lb 15 oz) IBW/kg (Calculated) : 52.4  Vital Signs: Temp: 97.9 F (36.6 C) (01/13 0444) BP: 144/80 (01/13 0444) Pulse Rate: 95 (01/13 0444)  Labs: Recent Labs    05/18/20 0534 05/20/20 0502  LABPROT  --  21.6*  INR  --  2.0*  CREATININE 0.79  --     Estimated Creatinine Clearance: 44.7 mL/min (by C-G formula based on SCr of 0.79 mg/dL).   Assessment: 87 yo female admitted on 05/01/2020 with back pain found to have T11 compression fracture. Patient is s/p thoracic 10-11 arthrodesis with neurosurgery.  Patient was on Coumadin PTA for Afib (PTA dose: 1 mg alternating with 1.5 mg PO every other day), which was reversed for surgery and then resumed on 05/06/20.   INR remains therapeutic today at 2 at low end of goal. CBC stable, no bleeding reported. Eating 50-100%.   Goal of Therapy:  INR 2-3 Monitor platelets by anticoagulation protocol: Yes   Plan:  Increase slightly to warfarin 1mg  TTSa and 1.5mg  MWFSu Q M/Th PT / INR, Qmon CBC    Benetta Spar, PharmD, BCPS, BCCP Clinical Pharmacist  Please check AMION for all [place] phone numbers After 10:00 PM, call [place] [phone]

## 2020-05-21 ENCOUNTER — Inpatient Hospital Stay (HOSPITAL_COMMUNITY): Payer: Medicare HMO

## 2020-05-21 ENCOUNTER — Inpatient Hospital Stay (HOSPITAL_COMMUNITY): Payer: Medicare HMO | Admitting: Physical Therapy

## 2020-05-21 ENCOUNTER — Other Ambulatory Visit: Payer: Self-pay | Admitting: Orthopedic Surgery

## 2020-05-21 DIAGNOSIS — Z20822 Contact with and (suspected) exposure to covid-19: Secondary | ICD-10-CM | POA: Diagnosis not present

## 2020-05-21 DIAGNOSIS — S22000D Wedge compression fracture of unspecified thoracic vertebra, subsequent encounter for fracture with routine healing: Secondary | ICD-10-CM | POA: Diagnosis not present

## 2020-05-21 DIAGNOSIS — I4891 Unspecified atrial fibrillation: Secondary | ICD-10-CM | POA: Diagnosis not present

## 2020-05-21 DIAGNOSIS — S2242XD Multiple fractures of ribs, left side, subsequent encounter for fracture with routine healing: Secondary | ICD-10-CM | POA: Diagnosis not present

## 2020-05-21 DIAGNOSIS — S22080D Wedge compression fracture of T11-T12 vertebra, subsequent encounter for fracture with routine healing: Secondary | ICD-10-CM | POA: Diagnosis not present

## 2020-05-21 DIAGNOSIS — E7800 Pure hypercholesterolemia, unspecified: Secondary | ICD-10-CM | POA: Diagnosis not present

## 2020-05-21 DIAGNOSIS — F321 Major depressive disorder, single episode, moderate: Secondary | ICD-10-CM | POA: Diagnosis not present

## 2020-05-21 DIAGNOSIS — B9620 Unspecified Escherichia coli [E. coli] as the cause of diseases classified elsewhere: Secondary | ICD-10-CM | POA: Diagnosis not present

## 2020-05-21 DIAGNOSIS — Z66 Do not resuscitate: Secondary | ICD-10-CM | POA: Diagnosis not present

## 2020-05-21 DIAGNOSIS — R531 Weakness: Secondary | ICD-10-CM | POA: Diagnosis not present

## 2020-05-21 DIAGNOSIS — R2681 Unsteadiness on feet: Secondary | ICD-10-CM | POA: Diagnosis not present

## 2020-05-21 DIAGNOSIS — I10 Essential (primary) hypertension: Secondary | ICD-10-CM | POA: Diagnosis not present

## 2020-05-21 DIAGNOSIS — M546 Pain in thoracic spine: Secondary | ICD-10-CM | POA: Diagnosis not present

## 2020-05-21 DIAGNOSIS — S22000A Wedge compression fracture of unspecified thoracic vertebra, initial encounter for closed fracture: Secondary | ICD-10-CM | POA: Diagnosis not present

## 2020-05-21 DIAGNOSIS — N390 Urinary tract infection, site not specified: Secondary | ICD-10-CM | POA: Diagnosis not present

## 2020-05-21 DIAGNOSIS — Z743 Need for continuous supervision: Secondary | ICD-10-CM | POA: Diagnosis not present

## 2020-05-21 DIAGNOSIS — M6281 Muscle weakness (generalized): Secondary | ICD-10-CM | POA: Diagnosis not present

## 2020-05-21 DIAGNOSIS — R279 Unspecified lack of coordination: Secondary | ICD-10-CM | POA: Diagnosis not present

## 2020-05-21 DIAGNOSIS — R1311 Dysphagia, oral phase: Secondary | ICD-10-CM | POA: Diagnosis not present

## 2020-05-21 DIAGNOSIS — R41841 Cognitive communication deficit: Secondary | ICD-10-CM | POA: Diagnosis not present

## 2020-05-21 DIAGNOSIS — I480 Paroxysmal atrial fibrillation: Secondary | ICD-10-CM | POA: Diagnosis not present

## 2020-05-21 DIAGNOSIS — D6869 Other thrombophilia: Secondary | ICD-10-CM | POA: Diagnosis not present

## 2020-05-21 DIAGNOSIS — M545 Low back pain, unspecified: Secondary | ICD-10-CM | POA: Diagnosis not present

## 2020-05-21 DIAGNOSIS — Z7901 Long term (current) use of anticoagulants: Secondary | ICD-10-CM | POA: Diagnosis not present

## 2020-05-21 DIAGNOSIS — Z981 Arthrodesis status: Secondary | ICD-10-CM | POA: Diagnosis not present

## 2020-05-21 DIAGNOSIS — R2689 Other abnormalities of gait and mobility: Secondary | ICD-10-CM | POA: Diagnosis not present

## 2020-05-21 MED ORDER — WARFARIN SODIUM 1 MG PO TABS: 1.0000 mg | ORAL_TABLET | Freq: Every day | ORAL | Status: DC

## 2020-05-21 MED ORDER — HYDROMORPHONE HCL 4 MG PO TABS: 4.0000 mg | ORAL_TABLET | Freq: Four times a day (QID) | ORAL | 0 refills | Status: DC | PRN

## 2020-05-21 MED ORDER — WARFARIN SODIUM 3 MG PO TABS: 1.5000 mg | ORAL_TABLET | Freq: Every day | ORAL | Status: DC

## 2020-05-21 MED ORDER — WARFARIN SODIUM 1 MG PO TABS: 1.0000 mg | ORAL_TABLET | Freq: Every day | ORAL | 0 refills | Status: DC

## 2020-05-21 MED ORDER — WARFARIN SODIUM 3 MG PO TABS: 1.5000 mg | ORAL_TABLET | ORAL | Status: DC

## 2020-05-21 MED ORDER — WARFARIN SODIUM 1 MG PO TABS: 1.0000 mg | ORAL_TABLET | ORAL | 0 refills | Status: DC

## 2020-05-21 MED ORDER — FENTANYL 25 MCG/HR TD PT72
1.0000 | MEDICATED_PATCH | TRANSDERMAL | 0 refills | Status: DC
Start: 2020-05-22 — End: 2020-06-17

## 2020-05-21 NOTE — Progress Notes (Signed)
Occupational Therapy Session Note  Patient Details  Name: Danielle Thomas MRN: [id number] Date of Birth: 1927-07-14  Today's Date: 05/21/2020 OT Individual Time: 0700-0740 OT Individual Time Calculation (min): 40 min    Short Term Goals: Week 1:  OT Short Term Goal 1 (Week 1): Pt will complete LB dressing with min A and use of AE, prn. OT Short Term Goal 2 (Week 1): Pt will complete toileting task with min A OT Short Term Goal 3 (Week 1): Pt will complete bathing with min A OT Short Term Goal 4 (Week 1): Pt will engage in functional ADL/IADL in standing x2 minutes to increase endurance required for self-care tasks.  Skilled Therapeutic Interventions/Progress Updates:    1:1. Pt received in EOB with pain unrated in back. Pt eventually agreeable to lying down in bed with HOB elevated for back upport with heating pad behing back for pain relief after 15 min at EOB eating and changing shirt with set up. Pt educated on log rolling and back precautions (no twisting) when looking at clothing options OT presenting. Pt HOB elevated supine>sitting EOB for LB dressing with S and threading BLE into pants with S crosing into figure 4 and sit to stand with RW with S to advance pants past hips. Pt dons TLSO prior to STS with S/VC. Exited session with pt seated in recliner, exit alarm on and call light in reach   Therapy Documentation Precautions:  Precautions Precautions: Back Precaution Comments: LSO Required Braces or Orthoses: Spinal Brace Spinal Brace: Lumbar corset,Applied in sitting position Restrictions Weight Bearing Restrictions: No General:   Vital Signs: Therapy Vitals Temp: 98.1 F (36.7 C) Pulse Rate: 78 Resp: 18 BP: (!) 110/57 Patient Position (if appropriate): Lying Oxygen Therapy SpO2: 95 % O2 Device: Room Air Pain:   ADL:   Vision   Perception    Praxis   Exercises:   Other Treatments:     Therapy/Group: Individual Therapy  Tonny Branch 05/21/2020,  6:35 AM

## 2020-05-21 NOTE — Progress Notes (Signed)
Physical Therapy Session Note  Patient Details  Name: Danielle Thomas MRN: [id number] Date of Birth: 1927/12/30  Today's Date: 05/21/2020 PT Individual Time:  900- 1000 PT individual Time Calculation (min): 60   Short Term Goals: Week 1:  PT Short Term Goal 1 (Week 1): Patient to perform bed mobility mod I. PT Short Term Goal 2 (Week 1): Patient to perform sit to stand with S. PT Short Term Goal 3 (Week 1): Patient to ambulation 150' with S to occasioinal CGA with RW. PT Short Term Goal 4 (Week 1): Patient to negotiate 12 steps with CGA with rails.  Skilled Therapeutic Interventions/Progress Updates: Pt presented in bed agreeable to therapy, c/o 10/10 pain. Pt also c/o stiffness due to positioning in bed. Pt performed ankle pumps, heel slides, SLR to fatigue bilaterally. Pt then performed supine to sit with supervision and increased time with use of bed features. While sitting EOB received am meds from nsg. Pt then ambulated to toilet with rollator and close S and performed toilet transfers with supervision (+ urinary void). Pt then ambulated back to EOB and donned lumbar corset with minA due to poor positioning. Pt frustrated because she is unable to have BM and was fixated on that for much of session. Pt was agreeable to ambulate to day room and provided pt edu that mobility is beneficial to motility. Pt ambulated with rollator to day room with supervision and x1 seated rest break. Pt performed standing balance activities including toe taps to target with rollator and standing shoulder flexion without AD for static and dynamic balance. Pt requesting to return to room. Pt ambulated back to room with rollator and without rest break. Pt returned to bed supervision with use of bed features and repositioned to comfort. Pt left in bed at end of sesison with bed alarm on, call bell within reach and needs met.      Therapy Documentation Precautions:  Precautions Precautions: Back Precaution Comments:  LSO Required Braces or Orthoses: Spinal Brace Spinal Brace: Lumbar corset,Applied in sitting position Restrictions Weight Bearing Restrictions: No General:   Vital Signs: Therapy Vitals Temp: 98.2 F (36.8 C) Temp Source: Oral Pulse Rate: 78 Resp: 16 BP: 101/60 Patient Position (if appropriate): Lying Oxygen Therapy SpO2: 94 % O2 Device: Room Air Pain:   Mobility:   Locomotion :    Trunk/Postural Assessment :    Balance:   Exercises:   Other Treatments:      Therapy/Group: Individual Therapy  Jarman Litton 05/21/2020, 4:23 PM

## 2020-05-21 NOTE — Progress Notes (Signed)
Occupational Therapy Discharge Summary  Patient Details  Name: Danielle Thomas MRN: [id number] Date of Birth: 11/29/1927  Patient has met 7 of 8 long term goals due to improved activity tolerance, improved balance, postural control, ability to compensate for deficits, functional use of  RIGHT lower and LEFT lower extremity and improved awareness.  Pt made slow progress with BADLs and functional transfers during this admission. Pt is limited by pain but is able to complete BADLs with supervision with the exception of min A for bathing. Pt discharging to SNF for further rehab. Functional transfers with RW at supervision. Patient to discharge at overall Supervision level. Pt is discharging SNF which will provide needed A/follow up.    Reasons goals not met: Pt did not meet bathing goal d/t needing A to thoroughly clean buttocks  Recommendation:  Patient will benefit from ongoing skilled OT services in skilled nursing facility setting to continue to advance functional skills in the area of BADL and iADL.  Equipment: No equipment provided discharge to SNF  Reasons for discharge: treatment goals met and discharge from hospital  Patient/family agrees with progress made and goals achieved: Yes  OT Discharge   Vision Baseline Vision/History: Wears glasses Wears Glasses: Reading only Patient Visual Report: No change from baseline Vision Assessment?: No apparent visual deficits Perception  Perception: Within Functional Limits Praxis Praxis: Intact Cognition Overall Cognitive Status: Within Functional Limits for tasks assessed Arousal/Alertness: Awake/alert Orientation Level: Oriented X4 Attention: Sustained Sustained Attention: Appears intact Memory: Appears intact Awareness: Appears intact Problem Solving: Appears intact Safety/Judgment: Appears intact Sensation Sensation Light Touch: Appears Intact Hot/Cold: Appears Intact Proprioception: Appears Intact Stereognosis: Not  tested Coordination Gross Motor Movements are Fluid and Coordinated: Yes Fine Motor Movements are Fluid and Coordinated: Yes Motor  Motor Motor: Within Functional Limits Motor - Skilled Clinical Observations: generalized weakness and pain limited Trunk/Postural Assessment  Cervical Assessment Cervical Assessment:  (forward head) Thoracic Assessment Thoracic Assessment:  (kyphosis) Lumbar Assessment Lumbar Assessment:  (posterior pelvic tilt; back precautions with LSO) Postural Control Postural Control: Deficits on evaluation  Balance Balance Balance Assessed: Yes Dynamic Sitting Balance Sitting balance - Comments: UE support needed for reaching with supervision Static Standing Balance Static Standing - Balance Support: Bilateral upper extremity supported Static Standing - Level of Assistance: 6: Modified independent (Device/Increase time) Dynamic Standing Balance Dynamic Standing - Balance Support: Bilateral upper extremity supported Dynamic Standing - Level of Assistance: 5: Stand by assistance Dynamic Standing - Balance Activities: Reaching across midline;Reaching for objects Extremity/Trunk Assessment RUE Assessment RUE Assessment: Within Functional Limits General Strength Comments: 4/5 LUE Assessment LUE Assessment: Within Functional Limits General Strength Comments: 4/5   Leroy Libman 05/21/2020, 11:35 AM

## 2020-05-21 NOTE — Progress Notes (Signed)
Inpatient Rehabilitation Care Coordinator Discharge Note  The overall goal for the admission was met for:   Discharge location: Yes. SNF- [facility]  Length of Stay: Yes. 7 days.   Discharge activity level: Yes. Minimal Asst.   Home/community participation: Yes. Limited.   Services provided included: MD, RD, PT, OT, RN, CM, TR, Pharmacy, Neuropsych and SW  Financial Services: Private Insurance: [facility]  Choices offered to/list presented to:SNF list provided  Follow-up services arranged: N/A  Comments (or additional information): contact pt dtr [name] # [phone]  Patient/Family verbalized understanding of follow-up arrangements: Yes  Individual responsible for coordination of the follow-up plan: Pt to have assistance with coordinating care needs.   Confirmed correct DME delivered: [name] 05/21/2020    [name]

## 2020-05-21 NOTE — Progress Notes (Signed)
Occupational Therapy Note  Patient Details  Name: Danielle Thomas MRN: [id number] Date of Birth: 1928/03/30  Today's Date: 05/21/2020 OT Missed Time: 52 Minutes Missed Time Reason: Pain  Pt resting in bed upon arrival.  Pt attempting to reposition self in bed in hopes of easing pain in back. Pt stated she wasn't able to "do therapy" because she was in too much pain. Assisted pt with repositioning in bed and assuring all needs within reach. Pt somewhat despondent because she is transferring to SNF. Emotional support provided. Pt remained in bed with bed alarm activated.   Leotis Shames [facility] 05/21/2020, 11:28 AM

## 2020-05-21 NOTE — Progress Notes (Signed)
Patient ID: Danielle Thomas, female   DOB: 05/20/1927, 87 y.o.   MRN: [id number]  SW received message from [facility] [employer] [id number] ext. [phone]; Josem Kaufmann ML#[id number]) reporting pt was approved for SNF and was asking for follow-up to discuss SNF location. SW spoke with Joseph Art to inform on [employer]; confirms approval.   SW received updates from [employer] with [employer] reporting pt would be entering co-pay days at $188 per day for days 21-41, and day 42 resets to $0 . SW spoke with pt and pt dtr [name] (conference call) to inform on above. Pt and family amenable to placement. SW updated [place] and pt assigned Therapist, sports.   [facility] ambulance [phone]) pick up scheduled for 2pm.   Loralee Pacas, MSW, [place] Office: [phone] Cell: [phone] Fax: [phone]

## 2020-05-21 NOTE — Plan of Care (Signed)
  Problem: Consults Goal: RH GENERAL PATIENT EDUCATION Description: See Patient Education module for education specifics. Outcome: Progressing   Problem: RH BLADDER ELIMINATION Goal: RH STG MANAGE BLADDER WITH ASSISTANCE Description: STG Manage Bladder With  min Assistance Outcome: Progressing   Problem: RH SKIN INTEGRITY Goal: RH STG SKIN FREE OF INFECTION/BREAKDOWN Description: Maintain skin with min assist Outcome: Progressing Goal: RH STG ABLE TO PERFORM INCISION/WOUND CARE W/ASSISTANCE Description: STG Able To Perform Incision/Wound Care With  min Assistance. Outcome: Progressing   Problem: RH SAFETY Goal: RH STG ADHERE TO SAFETY PRECAUTIONS W/ASSISTANCE/DEVICE Description: STG Adhere to Safety Precautions With  cues/reminders Assistance/Device. Outcome: Progressing   Problem: RH PAIN MANAGEMENT Goal: RH STG PAIN MANAGED AT OR BELOW PT'S PAIN GOAL Description: Pain at or below level 4 Outcome: Progressing   Problem: RH KNOWLEDGE DEFICIT GENERAL Goal: RH STG INCREASE KNOWLEDGE OF SELF CARE AFTER HOSPITALIZATION Description: Patient and family will be able to manage care at discharge using handouts and educational materials independently Outcome: Progressing

## 2020-05-21 NOTE — Progress Notes (Signed)
Report called to [facility] at [facility]. Patient going to room 507. [id number]-[facility] here to transport patient to [facility].

## 2020-05-21 NOTE — Progress Notes (Addendum)
PHYSICAL MEDICINE & REHABILITATION PROGRESS NOTE   Subjective/Complaints: Complaining of pain and asks for her medication.  She has no other complaints We have SNF bed at 2pm [place].   ROS:   Pt denies SOB, abd pain, CP, N/V/C/D, and vision changes  Objective:   No results found. No results for input(s): WBC, HGB, HCT, PLT in the last 72 hours. No results for input(s): NA, K, CL, CO2, GLUCOSE, BUN, CREATININE, CALCIUM in the last 72 hours.  Intake/Output Summary (Last 24 hours) at 05/21/2020 1043 Last data filed at 05/21/2020 0700 Gross per 24 hour  Intake 640 ml  Output --  Net 640 ml     Physical Exam: Vital Signs Blood pressure (!) 110/57, pulse 78, temperature 98.1 F (36.7 C), resp. rate 18, height 5\' 3"  (1.6 m), weight 73.7 kg, SpO2 95 %. Gen: no distress, normal appearing HEENT: oral mucosa pink and moist, NCAT Cardio: Reg rate Chest: normal effort, normal rate of breathing Abd: soft, non-distended Ext: no edema Psych: pleasant, normal affect Skin: intact Psych: a little grumpy still, but cordial Musculoskeletal: TTP over middle/low back, poor sitting posture    Comments: UEs 5/5 in deltoids, biceps, triceps, WE grip and finger abd B/L LEs- 4/5 HF, 5-/5 in KE, DF and PF B/L  Skin: still has a very large dark purple bruise across entire upper back- incision for surgery underneath it- basically healed over- no drainage, no erythema Neurological: Ox3- very HOH   Assessment/Plan: 1. Functional deficits which require 3+ hours per day of interdisciplinary therapy in a comprehensive inpatient rehab setting.  Physiatrist is providing close team supervision and 24 hour management of active medical problems listed below.  Physiatrist and rehab team continue to assess barriers to discharge/monitor patient progress toward functional and medical goals  Care Tool:  Bathing    Body parts bathed by patient: Right arm,Left arm,Chest,Abdomen,Front perineal  area,Right upper leg,Left upper leg,Right lower leg,Left lower leg,Face   Body parts bathed by helper: Buttocks     Bathing assist Assist Level: Minimal Assistance - Patient > 75%     Upper Body Dressing/Undressing Upper body dressing   What is the patient wearing?: Pull over shirt,Orthosis    Upper body assist Assist Level: Supervision/Verbal cueing    Lower Body Dressing/Undressing Lower body dressing      What is the patient wearing?: Pants,Incontinence brief     Lower body assist Assist for lower body dressing: Supervision/Verbal cueing     Toileting Toileting    Toileting assist Assist for toileting: Supervision/Verbal cueing     Transfers Chair/bed transfer  Transfers assist     Chair/bed transfer assist level: Supervision/Verbal cueing     Locomotion Ambulation   Ambulation assist      Assist level: Contact Guard/Touching assist Assistive device: Rollator Max distance: 150   Walk 10 feet activity   Assist     Assist level: Contact Guard/Touching assist Assistive device: Rollator   Walk 50 feet activity   Assist    Assist level: Contact Guard/Touching assist Assistive device: Rollator    Walk 150 feet activity   Assist Walk 150 feet activity did not occur: Safety/medical concerns  Assist level: Contact Guard/Touching assist Assistive device: Rollator    Walk 10 feet on uneven surface  activity   Assist     Assist level: Minimal Assistance - Patient > 75% Assistive device: Aeronautical engineer Will patient use wheelchair at discharge?: No Type of Wheelchair:  Manual    Wheelchair assist level: Supervision/Verbal cueing Max wheelchair distance: 130'    Wheelchair 50 feet with 2 turns activity    Assist        Assist Level: Supervision/Verbal cueing   Wheelchair 150 feet activity     Assist  Wheelchair 150 feet activity did not occur: Safety/medical concerns       Blood  pressure (!) 110/57, pulse 78, temperature 98.1 F (36.7 C), resp. rate 18, height 5\' 3"  (1.6 m), weight 73.7 kg, SpO2 95 %.  Medical Problem List and Plan: 1.  Decreased functional mobility secondary to thoracic T11 compression fracture.  Status post T10-11 arthrodesis percutaneous pedicle screw 05/04/2020.  Back brace as directed             -patient may  Shower- be careful with back brace             -ELOS/Goals:  10-14 days- mod I  1/13- d/c date 1/19 currently- pt asking "for a few more days"- will d/w therapy  -Continue CIR 2.  Antithrombotics: -DVT/anticoagulation: Chronic Coumadin 1/8- INR per pharmacy 1/11- INR 2.2- per pharmacy             -antiplatelet therapy: N/A 3. Pain Management: OxyContin 10 mg every 12 hours, Percocet 5/325 mg q4 hours prn, Valium as needed muscle spasms.  Monitor mental status esp due to her age. Will verify meds with pharmacy which I did since some meds were changed.   1/7- changed pain meds yesterday- pain doing better- con't regimen   1/9 added kpad for back spasms which pt says has helped  1/10- changed oxy prn to Dilaudid 2 mg q4 hours prn- con't Oxycontin 10 mg BID- she said "oxy wasn't working".  1/11- reports pain doing better than on  Oxycodone- con't dilaudid and Oxycontin for now.    1/12- changed Oxycontin to Duragesic 25 mcg q3 days- and scheduled AM dilaudid at 6am- otherwise prn.  1/13- changed Dilaudid to 4 mg q4 hours prn and at 6am daily- since takes a few days for Duragesic to hit maximal effectiveness- Monitor for oversedation!   1/14: pain is well controlled with current medications.  4. Mood: Provide emotional support             -antipsychotic agents: N/A  -Sedated: Continue to wean pain and anxiety medications in SNF as tolerated as these could be contributing to sedation 5. Neuropsych: This patient is capable of making decisions on her own behalf. 6. Skin/Wound Care: Routine skin checks 7. Fluids/Electrolytes/Nutrition: Routine  in and outs with follow-up chemistries 8.  E. coli UTI.  Completed course of Keflex 9.  Hypertension.  Cozaar 50 mg daily.  Monitor with increased mobility  1/8- BP controlled- con't regimen  1/10- BP somewhat elevated this AM 150s/99- since had been doing well- will monitor- no changes yet  1/14: BP is very soft. On pain and antianxiety meds that would lower BP- continue to titrate down as tolerated.  10.  Atrial fibrillation.  Coumadin as directed.  Cardiac rate controlled  1/8- rate in 90's. con't regimen  1/13- rate controlled- con't regimen 11.  Restless leg syndrome.  Requip 0.25 mg in AM prn and 0.5 mg every evening at 6pm- that was her home dose  1/7- restless leg syndrome doing better with changes- con't regimen 12.  Constipation.  Senokot-S twice daily, MiraLAX twice daily, Dulcolax tablet/ suppository daily as needed- will add Sorbitol x1 today - (only 1 tiny pebble last 3x).  1/7- will give another dose of Sorbitol 60 G this afternoon and if doesn't go, needs mg citrate over weekend- on a lot of bowel meds- but needs ot have BM- has been 5 days.   1/8- added metamucil to bid miralax       -increase senna to 2 twice daily (didn't receive last two doses however)  1/9 still no bm, she is eating a little bit   -try mg citrate today with SSE if needed  1/10- pt didn't have BM from before rehab admission to Sunday evening- then upset because she had at least 5 BMs due to severe constipation- however has a different memory of when went/had BM than the chart shows- she went without BM for 5-7 days (had 2 tiny small pellet BMs during that time, but that isn't a BM)- will hold senokot 2 tabs BID for now- and monitor  1/11- had multiple accidents in last 24 hours- off Senokot completely- also decreased miralax today to 1 dose/day- con't metmucil to bulk up stools.   1/12- not sure if had another blowout yesterday- off miralax and senokot for now- but will restart senokot tomorrow if no BM.    1/13- will restart Senokot esp with pain meds and give smaller dose of Sorbitol.  13. Poor appetite- could be poor due to constipation will monitor-   -discussed intake with patient   -RD is following    >30 minutes spent in discharge of patient including review of medications and follow-up appointments, physical examination, and in answering all patient's questions  LOS: 8 days A FACE TO FACE EVALUATION WAS [place] 05/21/2020, 10:43 AM

## 2020-05-22 NOTE — Progress Notes (Signed)
Physical Therapy Discharge Summary  Patient Details  Name: Danielle Thomas MRN: [id number] Date of Birth: Aug 27, 1927  Today's Date: 05/22/2020      Patient has met 6 of 10 long term goals due to improved activity tolerance, improved balance, improved postural control, increased strength and improved awareness.  Patient to discharge at an ambulatory level Supervision.   Patient's care partner is independent to provide the necessary physical assistance at discharge.  Reasons goals not met: Pt demonstrated overall fair safety with functional mobility however was limited by pain and continued to require intermittent cues for spinal precautions particularly during bed mobility and did continue to require use of bed features. Pt will be d/c'd to SNF for continued progression of rehab to ensure safety with overall functional mobility.   Recommendation:  Patient will benefit from ongoing skilled PT services in skilled nursing facility setting to continue to advance safe functional mobility, address ongoing impairments in endurance, strength, safety, balance, independence with functional mobility, pain management, and minimize fall risk.  Equipment: No equipment provided  Reasons for discharge: discharge from hospital  Patient/family agrees with progress made and goals achieved: Yes  PT Discharge Precautions/Restrictions Precautions Precautions: Back Precaution Comments: LSO Required Braces or Orthoses: Spinal Brace Spinal Brace: Lumbar corset;Applied in sitting position Restrictions Weight Bearing Restrictions: No Vital Signs   Pain   Vision/Perception  Perception Perception: Within Functional Limits Praxis Praxis: Intact  Cognition Overall Cognitive Status: Within Functional Limits for tasks assessed Arousal/Alertness: Awake/alert Attention: Sustained Sustained Attention: Appears intact Memory: Appears intact Awareness: Appears intact Problem Solving: Appears  intact Safety/Judgment: Appears intact Sensation Sensation Light Touch: Appears Intact Hot/Cold: Appears Intact Proprioception: Appears Intact Stereognosis: Not tested Coordination Gross Motor Movements are Fluid and Coordinated: Yes Fine Motor Movements are Fluid and Coordinated: Yes Motor  Motor Motor: Within Functional Limits Motor - Skilled Clinical Observations: generalized weakness and pain limited  Mobility Bed Mobility Bed Mobility: Supine to Sit;Sit to Supine Supine to Sit: Supervision/Verbal cueing Sit to Supine: Supervision/Verbal cueing Transfers Transfers: Sit to Stand;Stand to Sit Sit to Stand: Supervision/Verbal cueing Stand to Sit: Supervision/Verbal cueing Transfer (Assistive device): Rollator Locomotion  Gait Ambulation: Yes Gait Assistance: Supervision/Verbal cueing;Contact Guard/Touching assist Gait Distance (Feet): 150 Feet Assistive device: Rollator Gait Assistance Details: Verbal cues for safe use of DME/AE Gait Assistance Details: requires intermittent cues for safe use of rollator Gait Gait: Yes Gait Pattern: Impaired Gait Pattern: Trunk flexed;Decreased stride length Stairs / Additional Locomotion Stairs: Yes Stairs Assistance: Contact Guard/Touching assist Stair Management Technique: Two rails Number of Stairs: 8 Height of Stairs: 6 Wheelchair Mobility Wheelchair Mobility: No  Trunk/Postural Assessment  Cervical Assessment Cervical Assessment: Exceptions to [facility] (forward head) Thoracic Assessment Thoracic Assessment: Exceptions to [facility] (kyphotic) Lumbar Assessment Lumbar Assessment: Exceptions to [facility] (posterior pelvic tilt) Postural Control Postural Control: Deficits on evaluation  Balance Balance Balance Assessed: Yes Dynamic Sitting Balance Sitting balance - Comments: UE support needed for reaching with supervision Static Standing Balance Static Standing - Balance Support: Bilateral upper extremity supported Static Standing -  Level of Assistance: 6: Modified independent (Device/Increase time) Dynamic Standing Balance Dynamic Standing - Balance Support: Bilateral upper extremity supported Dynamic Standing - Level of Assistance: 5: Stand by assistance Dynamic Standing - Balance Activities: Reaching across midline;Reaching for objects Extremity Assessment      RLE Assessment RLE Assessment: Exceptions to [facility] General Strength Comments: grossly 4/5 proximal to distal LLE Assessment LLE Assessment: Exceptions to [facility] General Strength Comments: grossly 4+/5, proximal to distal  Danielle Thomas 05/22/2020, 10:13 AM

## 2020-05-23 ENCOUNTER — Inpatient Hospital Stay (HOSPITAL_COMMUNITY): Payer: Medicare HMO

## 2020-05-24 ENCOUNTER — Other Ambulatory Visit: Payer: Self-pay | Admitting: Orthopedic Surgery

## 2020-05-24 MED ORDER — HYDROMORPHONE HCL 4 MG PO TABS: 4.0000 mg | ORAL_TABLET | Freq: Four times a day (QID) | ORAL | 0 refills | Status: DC | PRN

## 2020-05-28 LAB — BASIC METABOLIC PANEL
BUN: 16 (ref 4–21)
CO2: 24 — AB (ref 13–22)
Chloride: 107 (ref 99–108)
Creatinine: 0.6 (ref 0.5–1.1)
Glucose: 91
Potassium: 4.3 (ref 3.4–5.3)
Sodium: 141 (ref 137–147)

## 2020-05-28 LAB — CBC AND DIFFERENTIAL
HCT: 36 (ref 36–46)
Hemoglobin: 12 (ref 12.0–16.0)
Platelets: 224 (ref 150–399)
WBC: 5.2

## 2020-05-28 LAB — COMPREHENSIVE METABOLIC PANEL
Calcium: 8.2 — AB (ref 8.7–10.7)
GFR calc Af Amer: >90
GFR calc non Af Amer: 80.26

## 2020-05-28 LAB — CBC: RBC: 4 (ref 3.87–5.11)

## 2020-05-29 ENCOUNTER — Telehealth: Payer: Self-pay | Admitting: Adult Health

## 2020-05-29 NOTE — Telephone Encounter (Signed)
Nurse called from [facility] stating that the INR was 2.4.  There is no coumadin sheet to review with me. The nurse does not know the previous trend of INRs.or frequency of checks. He states the dose is 1.5 mg M W F but this is not consistent with our records.  The INR is therapeutic with no acute concerns. Will continue current dose and recheck next week. Provider to f/u regarding clarification of coumadin dosing.  Nurse also reports that the pt and her daughter are very upset that the Dilaudid order has expired. She is taking 4 mg q 6 prn and tolerating well for rib and back pain. I allowed the order to continue and f/u with provider next week.

## 2020-05-31 ENCOUNTER — Non-Acute Institutional Stay (SKILLED_NURSING_FACILITY): Payer: Medicare HMO | Admitting: Orthopedic Surgery

## 2020-05-31 ENCOUNTER — Encounter: Payer: Self-pay | Admitting: Orthopedic Surgery

## 2020-05-31 ENCOUNTER — Other Ambulatory Visit: Payer: Self-pay | Admitting: Orthopedic Surgery

## 2020-05-31 DIAGNOSIS — Z7901 Long term (current) use of anticoagulants: Secondary | ICD-10-CM

## 2020-05-31 DIAGNOSIS — S22080D Wedge compression fracture of T11-T12 vertebra, subsequent encounter for fracture with routine healing: Secondary | ICD-10-CM | POA: Diagnosis not present

## 2020-05-31 DIAGNOSIS — E7800 Pure hypercholesterolemia, unspecified: Secondary | ICD-10-CM

## 2020-05-31 DIAGNOSIS — S2242XD Multiple fractures of ribs, left side, subsequent encounter for fracture with routine healing: Secondary | ICD-10-CM

## 2020-05-31 DIAGNOSIS — I10 Essential (primary) hypertension: Secondary | ICD-10-CM | POA: Diagnosis not present

## 2020-05-31 MED ORDER — HYDROMORPHONE HCL 4 MG PO TABS: 4.0000 mg | ORAL_TABLET | Freq: Three times a day (TID) | ORAL | 0 refills | Status: AC | PRN

## 2020-05-31 NOTE — Progress Notes (Signed)
Location: [place] Provider: Windell Moulding, AGNP-C     Gervais Mariea Clonts, D.O., C.M.D.   Goals of Care:  Advanced Directives 05/13/2020  Does Patient Have a Medical Advance Directive? Yes  Type of Advance Directive Living will  Does patient want to make changes to medical advance directive? Yes (Inpatient - patient defers changing a medical advance directive and declines information at this time)  Would patient like information on creating a medical advance directive? No - Patient declined     No chief complaint on file.   HPI: Patient is a 87 y.o. female seen today for hospital follow-up s/p admission from [facility] 01/6- 01/14.   She was hospitalized at [facility] 03/27/2020- 03/29/2020 for electrolyte abnormalities. T11 compression fracture also found. She was given pain medication, advised to wear TSLO brace and discharged to SNF with plans for outpatient follow-up. On 12/25 she presented to [facility] ED with increased back pain. She completed 2 weeks of snf before returning home. Once home, she was bed bound and had difficulty ambulating. Sodium 134. Potassium 2.9. Bun 22. Creatinine 0.90. WBC 11.3. Hemoglobin 16.9. Urine culture with E.coli. MRI thoracic lumbar spine showed progressed T11 compression fracture with increased comminution. She had lost additional height, from 20-40 percent. Mild spinal stenosis with mild cord mass-effect, but no cord signal abnormality found. Left rib fractures  9-11 also noted. Left paraspinal soft tissue edema from T9-T12. 12/28 she underwent T10-11 arthrodesis allograft morsels percutaneous pedicle screws with fixation by Dr. Christella Noa. She tolerated procedure well. Coumadin restarted after procedure. Advised to continue wearing back brace. Given keflex for UTI. [facility] ([facility]) recommended at discharge.   She was admitted to [facility] 05/13/20. She improved her activity tolerance, balance and  postural control during her stay. Ambulation has increased. Still requiring help with ADLs. She was discharged to [facility] 01/14 for additional PT/OT and skilled nursing services.   Today, she is laying sideways in bed during encounter. She is alert and oriented. Follows commands and can express needs. Complains of pain today. Unsure if pain stemming from back or fractured ribs. States her mid back,left side hurts the most. Cannot describe pain, states "its painful." Requesting more pain medication, states it is effective. She is having regular bowel movements. Appetite fair. She demonstrated putting her back brace on in front of me. Denies chest pain, shortness of breath, urinary frequency.   Recent blood pressures are as follows:   01/24- 138/82  01/23- 127/71  01/22- 150/89  No recent falls or injuries.   She has received her covid-19 series. Booster done in November 2021.   Past Medical History:  Diagnosis Date  . Hypertension     Past Surgical History:  Procedure Laterality Date  . KNEE SURGERY    . LUMBAR PERCUTANEOUS PEDICLE SCREW 2 LEVEL N/A 05/04/2020   Procedure: Thoracic nine to Thoracic twelve percutaneous pedicle screws;  Surgeon: Ashok Pall, MD;  Location: [place];  Service: Neurosurgery;  Laterality: N/A;  . LUNG BIOPSY    . PARTIAL HYSTERECTOMY    . TONSILLECTOMY AND ADENOIDECTOMY      Allergies  Allergen Reactions  . Amlodipine Besylate     Other reaction(s): high dose make feet swell  . Codeine Nausea And Vomiting  . Meperidine Nausea Only  . Metoprolol Succinate [Metoprolol]     Other reaction(s): effects her breathing  . Oxycodone Nausea And Vomiting    Outpatient Encounter Medications as of 05/31/2020  Medication Sig  . acetaminophen (TYLENOL) 500 MG tablet Take 1 tablet (500 mg total) by mouth 3 (three) times daily. For acute pain for 3 to 5 days and then consider changing to as needed.  . bisacodyl (DULCOLAX) 10 MG suppository Place 1 suppository (10  mg total) rectally daily as needed for moderate constipation.  . bisacodyl (DULCOLAX) 5 MG EC tablet Take 1 tablet (5 mg total) by mouth daily as needed for moderate constipation.  . dorzolamide (TRUSOPT) 2 % ophthalmic solution Place 1 drop into both eyes 2 (two) times daily.   . fentaNYL (DURAGESIC) 25 MCG/HR Place 1 patch onto the skin every 3 (three) days.  Marland Kitchen HYDROmorphone (DILAUDID) 4 MG tablet Take 1 tablet (4 mg total) by mouth every 6 (six) hours as needed for up to 7 days for severe pain.  Marland Kitchen losartan (COZAAR) 50 MG tablet Take 50 mg by mouth daily.  . Multiple Vitamin (MULTIVITAMIN WITH MINERALS) TABS tablet Take 1 tablet by mouth daily.  . polyethylene glycol (MIRALAX / GLYCOLAX) 17 g packet Take 17 g by mouth daily.  . psyllium (HYDROCIL/METAMUCIL) 95 % PACK Take 1 packet by mouth daily.  Marland Kitchen rOPINIRole (REQUIP) 0.25 MG tablet Take 1 tablet (0.25 mg total) by mouth daily as needed (restless legs around 10am - home dose).  Marland Kitchen travoprost, benzalkonium, (TRAVATAN) 0.004 % ophthalmic solution Place 1 drop into both eyes at bedtime.   Marland Kitchen warfarin (COUMADIN) 1 MG tablet Take 1 tablet (1 mg total) by mouth every Tuesday, Thursday, and Saturday at 6 PM.  . warfarin (COUMADIN) 3 MG tablet Take 0.5 tablets (1.5 mg total) by mouth 4 (four) times a week. Take 1.5 mg (half a tablet) by mouth on Monday, Wednesday, Friday, and Sunday   No facility-administered encounter medications on file as of 05/31/2020.    Review of Systems:  Review of Systems  Constitutional: Positive for fever. Negative for malaise/fatigue.  HENT: Positive for hearing loss. Negative for sore throat.   Eyes: Negative for blurred vision.  Respiratory: Negative for cough, shortness of breath and wheezing.   Cardiovascular: Negative for chest pain and leg swelling.  Gastrointestinal: Negative for abdominal pain, heartburn, nausea and vomiting.  Genitourinary: Negative for dysuria, frequency and hematuria.  Musculoskeletal:  Positive for back pain and myalgias. Negative for falls.  Skin:       surgical incision  Neurological: Positive for weakness. Negative for dizziness and headaches.  Psychiatric/Behavioral: Negative for depression and memory loss. The patient is not nervous/anxious.     Health Maintenance  Topic Date Due  . TETANUS/TDAP  Never done  . PNA vac Low Risk Adult (1 of 2 - PCV13) Never done  . INFLUENZA VACCINE  Never done  . DEXA SCAN  Completed  . COVID-19 Vaccine  Completed    Physical Exam: There were no vitals filed for this visit. There is no height or weight on file to calculate BMI. Physical Exam Vitals reviewed.  Constitutional:      General: She is not in acute distress. HENT:     Head: Normocephalic.     Right Ear: There is no impacted cerumen.     Left Ear: There is no impacted cerumen.     Nose: Nose normal.     Mouth/Throat:     Mouth: Mucous membranes are moist.     Pharynx: No posterior oropharyngeal erythema.  Eyes:     General:        Right eye: No discharge.  Left eye: No discharge.  Cardiovascular:     Rate and Rhythm: Normal rate. Rhythm irregular.     Pulses: Normal pulses.     Heart sounds: Normal heart sounds. No murmur heard.   Pulmonary:     Effort: Pulmonary effort is normal. No respiratory distress.     Breath sounds: Normal breath sounds. No wheezing.  Abdominal:     General: Bowel sounds are normal. There is distension.     Palpations: Abdomen is soft.     Tenderness: There is no abdominal tenderness.  Musculoskeletal:     Cervical back: Normal range of motion.     Right lower leg: No edema.     Left lower leg: No edema.  Lymphadenopathy:     Cervical: No cervical adenopathy.  Skin:    General: Skin is warm and dry.     Capillary Refill: Capillary refill takes less than 2 seconds.     Comments: Surgical incision, mid upper back, open to air, scabbing present. No drainage. Surrounding skin intact.   Neurological:     General: No  focal deficit present.     Mental Status: She is alert and oriented to person, place, and time.     Motor: Weakness present.     Gait: Gait abnormal.     Comments: walker  Psychiatric:        Mood and Affect: Mood normal.        Behavior: Behavior normal.     Labs reviewed: Basic Metabolic Panel: Recent Labs    05/07/20 0301 05/08/20 0255 05/12/20 0208 05/14/20 0519 05/18/20 0534  NA 135 135 134* 134* 137  K 4.3 3.6 4.1 4.0 4.2  CL 100 101 101 103 106  CO2 27 26 24 23 23   GLUCOSE 112* 101* 109* 103* 97  BUN 20 16 17 11 10   CREATININE 0.75 0.70 0.82 0.69 0.79  CALCIUM 8.0* 7.8* 8.0* 8.1* 8.3*  MG 2.0 1.8 1.9  --   --   PHOS 1.9* 3.0 3.5  --   --    Liver Function Tests: Recent Labs    05/08/20 0255 05/12/20 0208 05/14/20 0519  AST 19 23 22   ALT 20 26 24   ALKPHOS 44 52 58  BILITOT 0.8 0.9 1.2  PROT 4.8* 4.6* 5.2*  ALBUMIN 2.3* 2.3* 2.5*   Recent Labs    03/27/20 0927  LIPASE 22   No results for input(s): AMMONIA in the last 8760 hours. CBC: Recent Labs    05/08/20 0255 05/10/20 0444 05/12/20 0208 05/13/20 0150 05/14/20 0519 05/16/20 0511  WBC 9.1   < > 8.6 8.7 8.2 7.4  NEUTROABS 6.9  --  6.6  --  6.4  --   HGB 12.3   < > 11.4* 11.2* 11.8* 11.9*  HCT 38.3   < > 35.3* 35.3* 36.3 35.3*  MCV 91.6   < > 92.2 93.1 91.7 90.1  PLT 193   < > 217 219 239 261   < > = values in this interval not displayed.   Lipid Panel: No results for input(s): CHOL, HDL, LDLCALC, TRIG, CHOLHDL, LDLDIRECT in the last 8760 hours. No results found for: HGBA1C  Procedures since last visit: No results found.  Assessment/Plan 1. Compression fracture of T11 vertebra with routine healing, subsequent encounter - ongoing, still c/o pain,surgical incision no sign of infection, afebrile - continue dilaudid 4 mg po- will change to Q8 hrs prn - goal to wean off dilaudid in 1 week- discussed plan  with patient - will change bisacodyl to 5 mg po daily - start incentive spirometer  QID - continue PT/OT - continue wearing back brace  2. Closed fractures of multiple ribs of left side, with routine healing, subsequent encounter - stable, still c/o of pain - continue dilaudid 4 mg po- will change to Q8 hrs prn - will change bisacodyl to 5 mg po daily - start incentive spirometer QID  3. Essential hypertension, benign - bp at goal < 150/90 - continue losartan - continue to limit sodium from diet < 2000 mg/day  4. Paroxysmal atrial fibrillation (HCC) - rate controlled - continue warfarin regimen - continue PT/INR checks  5. Chronic anticoagulation - last INR 2.4- theraputic - continue current warfarin regimen - recheck PT/INR in 1 week   Communication: Plan discussed with patient and facility nurse Labs/tests ordered: PT/INR in 1 week    Tadd Holtmeyer [facility] [address], [place] [address] On Call:  [phone] & follow prompts after 5pm & weekends Office Phone:  [phone] Office Fax:  [phone]

## 2020-06-04 ENCOUNTER — Non-Acute Institutional Stay (SKILLED_NURSING_FACILITY): Payer: Medicare HMO | Admitting: Internal Medicine

## 2020-06-04 ENCOUNTER — Encounter: Payer: Self-pay | Admitting: Internal Medicine

## 2020-06-04 DIAGNOSIS — Z66 Do not resuscitate: Secondary | ICD-10-CM | POA: Diagnosis not present

## 2020-06-04 DIAGNOSIS — S22080D Wedge compression fracture of T11-T12 vertebra, subsequent encounter for fracture with routine healing: Secondary | ICD-10-CM

## 2020-06-04 DIAGNOSIS — I10 Essential (primary) hypertension: Secondary | ICD-10-CM

## 2020-06-04 DIAGNOSIS — I4891 Unspecified atrial fibrillation: Secondary | ICD-10-CM | POA: Diagnosis not present

## 2020-06-04 DIAGNOSIS — E7800 Pure hypercholesterolemia, unspecified: Secondary | ICD-10-CM

## 2020-06-04 DIAGNOSIS — F321 Major depressive disorder, single episode, moderate: Secondary | ICD-10-CM | POA: Diagnosis not present

## 2020-06-04 DIAGNOSIS — I480 Paroxysmal atrial fibrillation: Secondary | ICD-10-CM

## 2020-06-04 DIAGNOSIS — D6869 Other thrombophilia: Secondary | ICD-10-CM

## 2020-06-04 MED ORDER — DULOXETINE HCL 30 MG PO CPEP: 30.0000 mg | ORAL_CAPSULE | Freq: Every day | ORAL | 3 refills | Status: DC

## 2020-06-04 NOTE — Progress Notes (Signed)
Provider:  Rexene Edison. Mariea Clonts, D.O., C.M.D. Location:  [place] Room Number: [place] of Service:  SNF [phone]  PCP: Cari Caraway, MD Patient Care Team: Cari Caraway, MD as PCP - [facility])  Extended Emergency Contact Information Primary Emergency Contact: [facility] Address: [address]          [place],  [place] Home Phone: [phone] Mobile Phone: [phone] Relation: Daughter Secondary Emergency Contact: Roderick Pee Mobile Phone: [phone] Relation: Daughter  Code Status: DNR Goals of Care: Advanced Directive information Advanced Directives 06/04/2020  Does Patient Have a Medical Advance Directive? Yes  Type of Advance Directive Living will  Does patient want to make changes to medical advance directive? No - Patient declined  Would patient like information on creating a medical advance directive? No - Patient declined   Chief Complaint  Patient presents with  . New Admit To SNF    New Admit to [facility]    HPI: Patient is a 87 y.o. female seen today for admission to [facility] living [facility] rehab status post hospitalization at [facility] from January 6 to January 14.  She actually had a stay in [facility] rehab due to her compression fracture of her thoracic vertebra.  She also has a history of atrial fibrillation, E. coli UTI, hypertension, restless leg syndrome and constipation.  She had been living in a two-level home with a bed and bath on the main level and using a Rollator for mobility.  She was still driving and grocery shopping.  She had also been admitted from November 20-22 with electrolyte abnormalities.  He was found to have a T11 compression fracture which has been managed with pain medication and a TLSO back brace.  She had hoped to discharge home but wound of discharging to a skilled nursing facility at that time and after 2 weeks there she returned home but was essentially bedbound with difficulty in  ambulation.  She had presented to the hospital on December 25 with increasing back pain.  She had the E. coli urinary tract infection, mild hyponatremia and hypokalemia.  The MRI of the lumbar spine showed progression of her T11 compression fracture with increased comminution.  Loss of height had increased from 20 to 40% and there was no retropulsion of bone and mild spinal stenosis with mild cord mass-effect but no spinal cord abnormality.  She had associated acute to subacute posterior left rib fractures from 9-11.  She had left paraspinal soft tissue edema T9-12.  She underwent thoracic T10-11 arthrodesis, allograft and percutaneous pedicle screw fixation December 28 with Dr. Christella Noa.  He was continued on a back brace.  Her home Coumadin therapy was resumed.  She then completed her stay at [facility] rehab due to decreased functional mobility.  She had required Duragesic patch and Dilaudid initially for her pain and had Keflex for the E. coli urinary tract infection.  She had difficulty with constipation that was resolved with laxatives.  During the rehab stay she had improved to minimal guard sit to stand and minimal assist with 20 feet rolling walker.  She was able to ambulate to the day room without rest break contact-guard assist.  She was able to don her brace independently.  She was able to do some lower body ADLs with assistance.  She then came here due to limited assistance available at home.  She comes on mechanical soft diet.  She is to wear her back brace when out of bed.  She is on her chronic Coumadin with INR goal of 2-3.  Dosage has been Coumadin 1 mg on Tuesdays Thursdays and Saturdays and 1.5 mg Monday Wednesday Friday and Sunday.    She remains on a Duragesic patch and as needed Dilaudid for pain.    She has a bowel regimen including Dulcolax tablets and suppositories as well as MiraLAX, and Metamucil.    She uses Requip for her restless legs.    She is on Cozaar for her blood  pressure.   Rehab discharge summary indicates need for follow-up with Dr. Genice Rouge with PMR and Dr. Coletta Memos with neurosurgery.  She had her Moderna Covid vaccines February 12 and March 12 with a booster November 4 of 2021.  When seen, she was depressed and tearful and says her pain is not controlled with the reduction of dilaudid to every 8 hrs from every 4 hrs.  She still has the fentanyl patch.  We discussed heat and use of tylenol b/w (has scheduled).  She is willing to take a medication or depression.  Feels like she's lost her independence and does not like relying on others.she's eager to get out of here and be home.  Past Medical History:  Diagnosis Date  . Hypertension    Past Surgical History:  Procedure Laterality Date  . KNEE SURGERY    . LUMBAR PERCUTANEOUS PEDICLE SCREW 2 LEVEL N/A 05/04/2020   Procedure: Thoracic nine to Thoracic twelve percutaneous pedicle screws;  Surgeon: Coletta Memos, MD;  Location: [facility] OR;  Service: Neurosurgery;  Laterality: N/A;  . LUNG BIOPSY    . PARTIAL HYSTERECTOMY    . TONSILLECTOMY AND ADENOIDECTOMY      Social History   Socioeconomic History  . Marital status: Divorced    Spouse name: Not on file  . Number of children: Not on file  . Years of education: Not on file  . Highest education level: Not on file  Occupational History  . Not on file  Tobacco Use  . Smoking status: Never Smoker  . Smokeless tobacco: Never Used  Substance and Sexual Activity  . Alcohol use: Not on file  . Drug use: Not on file  . Sexual activity: Not on file  Other Topics Concern  . Not on file  Social History Narrative  . Not on file   Social Determinants of Health   Financial Resource Strain: Not on file  Food Insecurity: Not on file  Transportation Needs: Not on file  Physical Activity: Not on file  Stress: Not on file  Social Connections: Not on file    reports that she has never smoked. She has never used smokeless tobacco. No  history on file for alcohol use and drug use.  Family History  Problem Relation Age of Onset  . Other Other        NO HEALTH ISSUES    Health Maintenance  Topic Date Due  . TETANUS/TDAP  Never done  . PNA vac Low Risk Adult (1 of 2 - PCV13) Never done  . INFLUENZA VACCINE  Never done  . DEXA SCAN  Completed  . COVID-19 Vaccine  Completed    Allergies  Allergen Reactions  . Amlodipine Besylate     Other reaction(s): high dose make feet swell  . Codeine Nausea And Vomiting  . Meperidine Nausea Only  . Metoprolol Succinate [Metoprolol]     Other reaction(s): effects her breathing  . Oxycodone Nausea And Vomiting    Outpatient Encounter Medications as  of 06/04/2020  Medication Sig  . bisacodyl (DULCOLAX) 10 MG suppository Place 1 suppository (10 mg total) rectally daily as needed for moderate constipation.  . bisacodyl (DULCOLAX) 5 MG EC tablet Take 1 tablet (5 mg total) by mouth daily as needed for moderate constipation.  . dorzolamide (TRUSOPT) 2 % ophthalmic solution Place 1 drop into both eyes 2 (two) times daily.   . Ensure (ENSURE) Take 237 mLs by mouth 2 (two) times daily between meals.  . fentaNYL (DURAGESIC) 25 MCG/HR Place 1 patch onto the skin every 3 (three) days.  Marland Kitchen HYDROmorphone (DILAUDID) 4 MG tablet Take 1 tablet (4 mg total) by mouth every 8 (eight) hours as needed for up to 7 days for severe pain.  Marland Kitchen latanoprost (XALATAN) 0.005 % ophthalmic solution Place 1 drop into both eyes at bedtime.  Marland Kitchen losartan (COZAAR) 50 MG tablet Take 50 mg by mouth daily.  . Multiple Vitamin (MULTIVITAMIN WITH MINERALS) TABS tablet Take 1 tablet by mouth daily.  . polyethylene glycol (MIRALAX / GLYCOLAX) 17 g packet Take 17 g by mouth daily as needed.  . psyllium (HYDROCIL/METAMUCIL) 95 % PACK Take 1 packet by mouth daily.  Marland Kitchen rOPINIRole (REQUIP) 0.25 MG tablet Take 1 tablet (0.25 mg total) by mouth daily as needed (restless legs around 10am - home dose).  Marland Kitchen warfarin (COUMADIN) 3 MG  tablet Take 1.5 mg by mouth daily.  . [DISCONTINUED] DULoxetine (CYMBALTA) 30 MG capsule Take 1 capsule (30 mg total) by mouth daily.  . [DISCONTINUED] acetaminophen (TYLENOL) 500 MG tablet Take 1 tablet (500 mg total) by mouth 3 (three) times daily. For acute pain for 3 to 5 days and then consider changing to as needed.  . [DISCONTINUED] polyethylene glycol (MIRALAX / GLYCOLAX) 17 g packet Take 17 g by mouth daily. (Patient taking differently: Take 17 g by mouth daily as needed.)  . [DISCONTINUED] travoprost, benzalkonium, (TRAVATAN) 0.004 % ophthalmic solution Place 1 drop into both eyes at bedtime.   . [DISCONTINUED] warfarin (COUMADIN) 1 MG tablet Take 1 tablet (1 mg total) by mouth every Tuesday, Thursday, and Saturday at 6 PM.  . [DISCONTINUED] warfarin (COUMADIN) 3 MG tablet Take 0.5 tablets (1.5 mg total) by mouth 4 (four) times a week. Take 1.5 mg (half a tablet) by mouth on Monday, Wednesday, Friday, and Sunday   No facility-administered encounter medications on file as of 06/04/2020.    Review of Systems  Constitutional: Positive for malaise/fatigue. Negative for chills and fever.  HENT: Positive for hearing loss. Negative for congestion and sore throat.   Eyes: Negative for blurred vision.  Respiratory: Negative for cough and shortness of breath.   Cardiovascular: Negative for chest pain, palpitations and leg swelling.  Gastrointestinal: Negative for abdominal pain, blood in stool, constipation and melena.  Genitourinary: Negative for dysuria.  Musculoskeletal: Positive for back pain and falls.  Skin: Negative for itching and rash.  Neurological: Negative for dizziness and loss of consciousness.  Endo/Heme/Allergies: Bruises/bleeds easily.  Psychiatric/Behavioral: Positive for depression. The patient is nervous/anxious.     Vitals:   06/04/20 1337  BP: (!) 157/91  Pulse: 78  Temp: 97.7 F (36.5 C)  Weight: 162 lb 7.7 oz (73.7 kg)  Height: 5\' 3"  (1.6 m)   Body mass index  is 28.78 kg/m. Physical Exam Vitals reviewed.  HENT:     Head: Normocephalic and atraumatic.     Right Ear: External ear normal.     Left Ear: External ear normal.     Ears:  Comments: HOH    Nose: Nose normal.     Mouth/Throat:     Pharynx: Oropharynx is clear.  Eyes:     Conjunctiva/sclera: Conjunctivae normal.     Pupils: Pupils are equal, round, and reactive to light.  Cardiovascular:     Rate and Rhythm: Normal rate and regular rhythm.     Pulses: Normal pulses.     Heart sounds: Normal heart sounds.  Pulmonary:     Effort: Pulmonary effort is normal.     Breath sounds: Normal breath sounds. No wheezing, rhonchi or rales.  Abdominal:     General: Bowel sounds are normal. There is no distension.     Tenderness: There is no abdominal tenderness. There is no guarding or rebound.  Musculoskeletal:     Right lower leg: No edema.     Left lower leg: No edema.     Comments: Pain in back  Skin:    General: Skin is warm and dry.  Neurological:     General: No focal deficit present.     Mental Status: She is alert.     Cranial Nerves: No cranial nerve deficit.     Motor: No weakness.  Psychiatric:     Comments: Tearful and depressed     Labs reviewed: Basic Metabolic Panel: Recent Labs    05/07/20 0301 05/08/20 0255 05/12/20 0208 05/14/20 0519 05/18/20 0534 05/28/20 0000  NA 135 135 134* 134* 137 141  K 4.3 3.6 4.1 4.0 4.2 4.3  CL 100 101 101 103 106 107  CO2 27 26 24 23 23  24*  GLUCOSE 112* 101* 109* 103* 97  --   BUN 20 16 17 11 10 16   CREATININE 0.75 0.70 0.82 0.69 0.79 0.6  CALCIUM 8.0* 7.8* 8.0* 8.1* 8.3* 8.2*  MG 2.0 1.8 1.9  --   --   --   PHOS 1.9* 3.0 3.5  --   --   --    Liver Function Tests: Recent Labs    05/08/20 0255 05/12/20 0208 05/14/20 0519  AST 19 23 22   ALT 20 26 24   ALKPHOS 44 52 58  BILITOT 0.8 0.9 1.2  PROT 4.8* 4.6* 5.2*  ALBUMIN 2.3* 2.3* 2.5*   Recent Labs    03/27/20 0927  LIPASE 22   No results for input(s):  AMMONIA in the last 8760 hours. CBC: Recent Labs    05/08/20 0255 05/10/20 0444 05/12/20 0208 05/13/20 0150 05/14/20 0519 05/16/20 0511 05/28/20 0000  WBC 9.1   < > 8.6 8.7 8.2 7.4 5.2  NEUTROABS 6.9  --  6.6  --  6.4  --   --   HGB 12.3   < > 11.4* 11.2* 11.8* 11.9* 12.0  HCT 38.3   < > 35.3* 35.3* 36.3 35.3* 36  MCV 91.6   < > 92.2 93.1 91.7 90.1  --   PLT 193   < > 217 219 239 261 224   < > = values in this interval not displayed.   Cardiac Enzymes: No results for input(s): CKTOTAL, CKMB, CKMBINDEX, TROPONINI in the last 8760 hours. BNP: Invalid input(s): POCBNP No results found for: HGBA1C No results found for: TSH No results found for: VITAMINB12 No results found for: FOLATE No results found for: IRON, TIBC, FERRITIN  Assessment/Plan 1. Compression fracture of T11 vertebra with routine healing, subsequent encounter -dilaudid was reduced to q8h and using tylenol b/w, may use warm compresses, as well -also add cymbalta 30mg  daily for multiple benefits  2. Depression, major, single episode, moderate (HCC) -add cymbalta for this and pain  3. Essential hypertension, benign -bp elevated, but upset and having pain when seen, so cont to monitor and if consistently high, adjust  4. Pure hypercholesterolemia -cont current mgt and monitor  5. Hypercoagulable state due to atrial fibrillation (HCC) -cont coumadin 1.5mg  po daily with regular INR monitoring  6. Paroxysmal atrial fibrillation (HCC) -rate controlled--not on meds but on warfarin chronically  7. DNR (do not resuscitate) - Do not attempt resuscitation (DNR)  Family/ staff Communication: d/w snf nurse  Labs/tests ordered:  No new added   Bryker Fletchall L. Idelia Caudell, D.O. [facility] [address], [place] [address] Cell Phone (Mon-Fri 8am-5pm):  [phone] On Call:  [phone] & follow prompts after 5pm & weekends Office Phone:  [phone] Office Fax:   [phone]

## 2020-06-08 ENCOUNTER — Encounter: Payer: Medicare HMO | Admitting: Registered Nurse

## 2020-06-10 DIAGNOSIS — F321 Major depressive disorder, single episode, moderate: Secondary | ICD-10-CM | POA: Insufficient documentation

## 2020-06-17 ENCOUNTER — Encounter: Payer: Self-pay | Admitting: Orthopedic Surgery

## 2020-06-17 ENCOUNTER — Non-Acute Institutional Stay (SKILLED_NURSING_FACILITY): Payer: Medicare HMO | Admitting: Orthopedic Surgery

## 2020-06-17 DIAGNOSIS — S22080D Wedge compression fracture of T11-T12 vertebra, subsequent encounter for fracture with routine healing: Secondary | ICD-10-CM

## 2020-06-17 DIAGNOSIS — E7800 Pure hypercholesterolemia, unspecified: Secondary | ICD-10-CM | POA: Diagnosis not present

## 2020-06-17 DIAGNOSIS — I10 Essential (primary) hypertension: Secondary | ICD-10-CM | POA: Diagnosis not present

## 2020-06-17 DIAGNOSIS — F321 Major depressive disorder, single episode, moderate: Secondary | ICD-10-CM

## 2020-06-17 DIAGNOSIS — I480 Paroxysmal atrial fibrillation: Secondary | ICD-10-CM | POA: Diagnosis not present

## 2020-06-17 DIAGNOSIS — Z7901 Long term (current) use of anticoagulants: Secondary | ICD-10-CM

## 2020-06-17 MED ORDER — FENTANYL 25 MCG/HR TD PT72: 1.0000 | MEDICATED_PATCH | TRANSDERMAL | 0 refills | Status: DC

## 2020-06-17 MED ORDER — WARFARIN SODIUM 1 MG PO TABS: 1.0000 mg | ORAL_TABLET | ORAL | 0 refills | Status: DC

## 2020-06-17 MED ORDER — ROPINIROLE HCL 0.25 MG PO TABS: 0.2500 mg | ORAL_TABLET | Freq: Every day | ORAL | 0 refills | Status: AC | PRN

## 2020-06-17 MED ORDER — DULOXETINE HCL 30 MG PO CPEP: 30.0000 mg | ORAL_CAPSULE | Freq: Every day | ORAL | 0 refills | Status: DC

## 2020-06-17 MED ORDER — METHOCARBAMOL 500 MG PO TABS
500.0000 mg | ORAL_TABLET | Freq: Three times a day (TID) | ORAL | 0 refills | Status: DC | PRN
Start: 2020-06-17 — End: 2022-07-14

## 2020-06-17 MED ORDER — LOSARTAN POTASSIUM 50 MG PO TABS: 50.0000 mg | ORAL_TABLET | Freq: Every day | ORAL | 0 refills | Status: DC

## 2020-06-17 MED ORDER — LATANOPROST 0.005 % OP SOLN: 1.0000 [drp] | Freq: Every day | OPHTHALMIC | 0 refills | Status: AC

## 2020-06-17 MED ORDER — WARFARIN SODIUM 3 MG PO TABS: 1.5000 mg | ORAL_TABLET | ORAL | 0 refills | Status: DC

## 2020-06-17 NOTE — Progress Notes (Signed)
Location:    [facility] Room Number: 510/C Place of Service:  SNF [phone])  Provider: Windell Moulding NP  PCP: Cari Caraway, MD Patient Care Team: Cari Caraway, MD as PCP - General [facility])  Extended Emergency Contact Information Primary Emergency Contact: [facility] Address: [address]          [place],  [place] Home Phone: [phone] Mobile Phone: [phone] Relation: Daughter Secondary Emergency Contact: Roderick Pee Mobile Phone: [phone] Relation: Daughter  Code Status: DNR Goals of care:  Advanced Directive information Advanced Directives 06/17/2020  Does Patient Have a Medical Advance Directive? Yes  Type of Advance Directive Out of facility DNR (pink MOST or yellow form)  Does patient want to make changes to medical advance directive? No - Patient declined  Would patient like information on creating a medical advance directive? -  Pre-existing out of facility DNR order (yellow form or pink MOST form) Yellow form placed in chart (order not valid for inpatient use)     Allergies  Allergen Reactions  . Amlodipine Besylate     Other reaction(s): high dose make feet swell  . Codeine Nausea And Vomiting  . Meperidine Nausea Only  . Metoprolol Succinate [Metoprolol]     Other reaction(s): effects her breathing  . Oxycodone Nausea And Vomiting    Chief Complaint  Patient presents with  . Discharge Note    Discharge Visit     HPI:  87 y.o. female seen today for discharge evaluation.   She has been a resident of [facility] since 01/14. PMH includes: a-fib, hypertension, compression fracture of T11, depression, and hyperlipidemia.   Prior to hospitalization she lived in a two-story home and ambulated with rollator. Initially admitted at [facility] 11/20-11-22 with electrolyte abnormalities and found to have had T11 compression fracture. She was given pain medication and advised to wear  TLSO back brace. She was discharged to snf and then two weeks after home. Her back pain never resolved and she was basically bed bound and unable to walk. She was admitted to [facility] 12/25 for increased back pain, E.coli UTI, mild hyponatremia and hypokalemia. MRI lumbar spine showed progression of her T11 compression fracture, loss of height 20-40%, mild spinal stenosis, no spinal chord abnormality. 12/28 she underwent thoracic T10-11 arthrodesis, allograft and percutaneous pedicle screw fixation by Dr. Christella Noa. She tolerated procedure well and was discharged to [facility]. During her stay at [facility] she was given po Keflex for UTI. PO dilaudid and duragesic patch for back pain. Laxatives given for constipation. She began ambulating about 20 ft and able to perform some ADL's. It was recommended she be discharged to snf for additional PT services.   Today she is ambulating from the bathroom to recliner. She is alert and oriented x 3. Follows commands and can express needs. She states her pain has improved since admission. Currently receiving prn robaxin sna tylenol. Started on cymbalta for chronic pain. She states she would like to sit in bed more, but understands the need to ambulate to prevent stiffness. At this time she is ambulating about 100-150 ft with walker. Touching assistance with most ADL's. Continues to wear TLSO brace. Last bowel movement today. She denies chest pain, sob, or numbness/tingling in legs.   No recent falls or injuries.   Facility nurse does not report any concerns, vitals stable.   At this time, she plans to discharge home 06/23/20. Home health PT/OT ordered. No DME  needs. I have advised her to follow up with PCP and neurosurgeon.     Past Medical History:  Diagnosis Date  . Hypertension     Past Surgical History:  Procedure Laterality Date  . KNEE SURGERY    . LUMBAR PERCUTANEOUS PEDICLE SCREW 2 LEVEL N/A 05/04/2020   Procedure: Thoracic nine to Thoracic  twelve percutaneous pedicle screws;  Surgeon: Ashok Pall, MD;  Location: [facility];  Service: Neurosurgery;  Laterality: N/A;  . LUNG BIOPSY    . PARTIAL HYSTERECTOMY    . TONSILLECTOMY AND ADENOIDECTOMY        reports that she has never smoked. She has never used smokeless tobacco. No history on file for alcohol use and drug use. Social History   Socioeconomic History  . Marital status: Divorced    Spouse name: Not on file  . Number of children: Not on file  . Years of education: Not on file  . Highest education level: Not on file  Occupational History  . Not on file  Tobacco Use  . Smoking status: Never Smoker  . Smokeless tobacco: Never Used  Substance and Sexual Activity  . Alcohol use: Not on file  . Drug use: Not on file  . Sexual activity: Not on file  Other Topics Concern  . Not on file  Social History Narrative  . Not on file   Social Determinants of Health   Financial Resource Strain: Not on file  Food Insecurity: Not on file  Transportation Needs: Not on file  Physical Activity: Not on file  Stress: Not on file  Social Connections: Not on file  Intimate Partner Violence: Not on file   Functional Status Survey:    Allergies  Allergen Reactions  . Amlodipine Besylate     Other reaction(s): high dose make feet swell  . Codeine Nausea And Vomiting  . Meperidine Nausea Only  . Metoprolol Succinate [Metoprolol]     Other reaction(s): effects her breathing  . Oxycodone Nausea And Vomiting    Pertinent  Health Maintenance Due  Topic Date Due  . PNA vac Low Risk Adult (1 of 2 - PCV13) Never done  . INFLUENZA VACCINE  Never done  . DEXA SCAN  Completed    Medications: Allergies as of 06/17/2020      Reactions   Amlodipine Besylate    Other reaction(s): high dose make feet swell   Codeine Nausea And Vomiting   Meperidine Nausea Only   Metoprolol Succinate [metoprolol]    Other reaction(s): effects her breathing   Oxycodone Nausea And Vomiting       Medication List       Accurate as of June 17, 2020 10:03 AM. If you have any questions, ask your nurse or doctor.        acetaminophen 325 MG tablet Commonly known as: TYLENOL Take 650 mg by mouth every 6 (six) hours as needed.   bisacodyl 10 MG suppository Commonly known as: DULCOLAX Place 10 mg rectally daily as needed for moderate constipation. What changed: Another medication with the same name was removed. Continue taking this medication, and follow the directions you see here. Changed by: Yvonna Alanis, NP   bisacodyl 5 MG EC tablet Commonly known as: DULCOLAX Take 1 tablet (5 mg total) by mouth daily as needed for moderate constipation. What changed: Another medication with the same name was removed. Continue taking this medication, and follow the directions you see here. Changed by: Yvonna Alanis, NP   dorzolamide  2 % ophthalmic solution Commonly known as: TRUSOPT Place 1 drop into both eyes 2 (two) times daily.   DULoxetine 30 MG capsule Commonly known as: CYMBALTA Take 30 mg by mouth daily.   Ensure Take 237 mLs by mouth 2 (two) times daily between meals.   fentaNYL 25 MCG/HR Commonly known as: [place] 1 patch onto the skin every 3 (three) days.   latanoprost 0.005 % ophthalmic solution Commonly known as: XALATAN Place 1 drop into both eyes at bedtime.   losartan 50 MG tablet Commonly known as: COZAAR Take 50 mg by mouth daily.   methocarbamol 500 MG tablet Commonly known as: ROBAXIN Take 500 mg by mouth every 8 (eight) hours as needed for muscle spasms. For muscle spasms   multivitamin with minerals Tabs tablet Take 1 tablet by mouth daily.   polyethylene glycol 17 g packet Commonly known as: MIRALAX / GLYCOLAX Take 17 g by mouth daily as needed.   psyllium 95 % Pack Commonly known as: HYDROCIL/METAMUCIL Take 1 packet by mouth daily.   rOPINIRole 0.25 MG tablet Commonly known as: REQUIP Take 1 tablet (0.25 mg total) by mouth daily as  needed (restless legs around 10am - home dose).   warfarin 1 MG tablet Commonly known as: COUMADIN Take 1 mg by mouth. On Tues, Thurs, Sat What changed: Another medication with the same name was removed. Continue taking this medication, and follow the directions you see here. Changed by: Yvonna Alanis, NP   warfarin 3 MG tablet Commonly known as: COUMADIN Take 1.5 mg by mouth every Monday,Wednesday,Friday, and Sunday at 6 PM. What changed: Another medication with the same name was removed. Continue taking this medication, and follow the directions you see here. Changed by: Yvonna Alanis, NP       Review of Systems  Constitutional: Negative for activity change, appetite change and fever.  HENT: Positive for hearing loss. Negative for congestion, dental problem and trouble swallowing.   Eyes: Negative for visual disturbance.  Respiratory: Negative for cough, shortness of breath and wheezing.   Cardiovascular: Negative for chest pain and leg swelling.  Gastrointestinal: Negative for abdominal pain, constipation, diarrhea and nausea.  Genitourinary: Negative for dysuria, frequency and hematuria.  Musculoskeletal: Positive for arthralgias, back pain and myalgias.  Skin:       Dry skin, surgical incision  Neurological: Positive for weakness. Negative for dizziness, numbness and headaches.  Psychiatric/Behavioral: Positive for dysphoric mood. Negative for confusion. The patient is not nervous/anxious.     Vitals:   06/17/20 1002  BP: (!) 146/76  Pulse: 76  Resp: 18  Temp: (!) 97.5 F (36.4 C)  Weight: 160 lb 3.2 oz (72.7 kg)  Height: 5\' 3"  (1.6 m)   Body mass index is 28.38 kg/m. Physical Exam Vitals reviewed.  Constitutional:      General: She is not in acute distress. HENT:     Head: Normocephalic.     Right Ear: There is no impacted cerumen.     Left Ear: There is no impacted cerumen.     Nose: Nose normal.     Mouth/Throat:     Mouth: Mucous membranes are moist.  Eyes:      General:        Right eye: No discharge.        Left eye: No discharge.  Cardiovascular:     Rate and Rhythm: Normal rate. Rhythm irregular.     Pulses: Normal pulses.     Heart sounds: Normal  heart sounds. No murmur heard.   Pulmonary:     Effort: Pulmonary effort is normal. No respiratory distress.     Breath sounds: Normal breath sounds. No wheezing.  Abdominal:     General: Bowel sounds are normal. There is no distension.     Palpations: Abdomen is soft.     Tenderness: There is no abdominal tenderness.  Musculoskeletal:     Cervical back: Normal range of motion.     Right lower leg: No edema.     Left lower leg: No edema.  Lymphadenopathy:     Cervical: No cervical adenopathy.  Skin:    General: Skin is warm and dry.     Capillary Refill: Capillary refill takes less than 2 seconds.     Comments: Back incision closed, CDI, open to air. No drainage, erythema or effusion present. Surrounding tissue intact.   Neurological:     General: No focal deficit present.     Mental Status: She is alert and oriented to person, place, and time.     Motor: Weakness present.     Gait: Gait abnormal.     Comments: Walker, TLSO brace  Psychiatric:        Mood and Affect: Mood normal. Affect is flat.        Behavior: Behavior normal.     Labs reviewed: Basic Metabolic Panel: Recent Labs    05/07/20 0301 05/08/20 0255 05/12/20 0208 05/14/20 0519 05/18/20 0534 05/28/20 0000  NA 135 135 134* 134* 137 141  K 4.3 3.6 4.1 4.0 4.2 4.3  CL 100 101 101 103 106 107  CO2 27 26 24 23 23  24*  GLUCOSE 112* 101* 109* 103* 97  --   BUN 20 16 17 11 10 16   CREATININE 0.75 0.70 0.82 0.69 0.79 0.6  CALCIUM 8.0* 7.8* 8.0* 8.1* 8.3* 8.2*  MG 2.0 1.8 1.9  --   --   --   PHOS 1.9* 3.0 3.5  --   --   --    Liver Function Tests: Recent Labs    05/08/20 0255 05/12/20 0208 05/14/20 0519  AST 19 23 22   ALT 20 26 24   ALKPHOS 44 52 58  BILITOT 0.8 0.9 1.2  PROT 4.8* 4.6* 5.2*  ALBUMIN  2.3* 2.3* 2.5*   Recent Labs    03/27/20 0927  LIPASE 22   No results for input(s): AMMONIA in the last 8760 hours. CBC: Recent Labs    05/08/20 0255 05/10/20 0444 05/12/20 0208 05/13/20 0150 05/14/20 0519 05/16/20 0511 05/28/20 0000  WBC 9.1   < > 8.6 8.7 8.2 7.4 5.2  NEUTROABS 6.9  --  6.6  --  6.4  --   --   HGB 12.3   < > 11.4* 11.2* 11.8* 11.9* 12.0  HCT 38.3   < > 35.3* 35.3* 36.3 35.3* 36  MCV 91.6   < > 92.2 93.1 91.7 90.1  --   PLT 193   < > 217 219 239 261 224   < > = values in this interval not displayed.   Cardiac Enzymes: No results for input(s): CKTOTAL, CKMB, CKMBINDEX, TROPONINI in the last 8760 hours. BNP: Invalid input(s): POCBNP CBG: No results for input(s): GLUCAP in the last 8760 hours.  Procedures and Imaging Studies During Stay: No results found.  Assessment/Plan:   1. Compression fracture of T11 vertebra with routine healing, subsequent encounter -surgical incision CDI, no infection, ambulating more, pain tolerable - cont prn tylenol and robaxin -  cont fentanyl patch every 3 days - cont cymbalta- started 2 weeks ago - home health PT/OT - continue using TLSO brace and walker - advised to f/u with Dr. Christella Noa  2. Depression, major, single episode, moderate (HCC) - flat affect, upset her recovery is taking awhile - cont cymbalta   3. Essential hypertension, benign - bp at goal < 150/90 - elevated with pain - cont losartan  4. Pure hypercholesterolemia - stable with diet - cont low fat diet and avoid fried foods  5. Paroxysmal atrial fibrillation (HCC) - rate controlled without meds - cont warfarin regimen for clot prevention  6. Chronic anticoagulation - stable with warfarin - continue PT/INR checks    Patient is being discharged with the following home health services:PT/OT    Patient is being discharged with the following durable medical equipment: None   Patient has been advised to f/u with their PCP in 1-2 weeks to  bring them up to date on their rehab stay.  Social services at facility was responsible for arranging this appointment.  Pt was provided with a 30 day supply of prescriptions for medications and refills must be obtained from their PCP.  For controlled substances, a more limited supply may be provided adequate until PCP appointment only.  Future labs/tests needed:  Cbc/diff, bmp, PT/INR with PCP in 1-2 weeks

## 2020-06-22 ENCOUNTER — Other Ambulatory Visit: Payer: Self-pay | Admitting: Internal Medicine

## 2020-06-22 DIAGNOSIS — S22080D Wedge compression fracture of T11-T12 vertebra, subsequent encounter for fracture with routine healing: Secondary | ICD-10-CM

## 2020-06-22 MED ORDER — FENTANYL 25 MCG/HR TD PT72
1.0000 | MEDICATED_PATCH | TRANSDERMAL | 0 refills | Status: DC
Start: 2020-06-22 — End: 2022-07-14

## 2020-06-24 ENCOUNTER — Other Ambulatory Visit: Payer: Self-pay | Admitting: *Deleted

## 2020-06-24 DIAGNOSIS — H9190 Unspecified hearing loss, unspecified ear: Secondary | ICD-10-CM | POA: Diagnosis not present

## 2020-06-24 DIAGNOSIS — I480 Paroxysmal atrial fibrillation: Secondary | ICD-10-CM | POA: Diagnosis not present

## 2020-06-24 DIAGNOSIS — H401190 Primary open-angle glaucoma, unspecified eye, stage unspecified: Secondary | ICD-10-CM | POA: Diagnosis not present

## 2020-06-24 DIAGNOSIS — I10 Essential (primary) hypertension: Secondary | ICD-10-CM | POA: Diagnosis not present

## 2020-06-24 DIAGNOSIS — E7800 Pure hypercholesterolemia, unspecified: Secondary | ICD-10-CM | POA: Diagnosis not present

## 2020-06-24 DIAGNOSIS — S22080D Wedge compression fracture of T11-T12 vertebra, subsequent encounter for fracture with routine healing: Secondary | ICD-10-CM | POA: Diagnosis not present

## 2020-06-24 DIAGNOSIS — S2242XD Multiple fractures of ribs, left side, subsequent encounter for fracture with routine healing: Secondary | ICD-10-CM | POA: Diagnosis not present

## 2020-06-24 DIAGNOSIS — F3110 Bipolar disorder, current episode manic without psychotic features, unspecified: Secondary | ICD-10-CM | POA: Diagnosis not present

## 2020-06-24 DIAGNOSIS — G2581 Restless legs syndrome: Secondary | ICD-10-CM | POA: Diagnosis not present

## 2020-06-24 NOTE — Patient Outreach (Signed)
[facility]) Care Management  06/24/2020  Danielle Thomas 07-Sep-1927 [id number]   Transition of Care Referral   Referral Date : 06/24/20 Referral Source:  [employer] Discharge Report Date of Discharge: 06/23/20 Facility: [facility] Insurance: [facility]   Referral received. Transition of care calls being completed via EMMI -automated calls.  RN Care Coordinator will outreach patient for any red flags received.    Plan Will close [facility] care management case at this time.    Joylene Draft, RN, BSN  [facility] Management Coordinator  [phone]- Mobile [phone]- Toll Free Main Office

## 2020-06-29 DIAGNOSIS — H401190 Primary open-angle glaucoma, unspecified eye, stage unspecified: Secondary | ICD-10-CM | POA: Diagnosis not present

## 2020-06-29 DIAGNOSIS — S22080D Wedge compression fracture of T11-T12 vertebra, subsequent encounter for fracture with routine healing: Secondary | ICD-10-CM | POA: Diagnosis not present

## 2020-06-29 DIAGNOSIS — E7800 Pure hypercholesterolemia, unspecified: Secondary | ICD-10-CM | POA: Diagnosis not present

## 2020-06-29 DIAGNOSIS — S2242XD Multiple fractures of ribs, left side, subsequent encounter for fracture with routine healing: Secondary | ICD-10-CM | POA: Diagnosis not present

## 2020-06-29 DIAGNOSIS — F3110 Bipolar disorder, current episode manic without psychotic features, unspecified: Secondary | ICD-10-CM | POA: Diagnosis not present

## 2020-06-29 DIAGNOSIS — I480 Paroxysmal atrial fibrillation: Secondary | ICD-10-CM | POA: Diagnosis not present

## 2020-06-29 DIAGNOSIS — H9190 Unspecified hearing loss, unspecified ear: Secondary | ICD-10-CM | POA: Diagnosis not present

## 2020-06-29 DIAGNOSIS — I10 Essential (primary) hypertension: Secondary | ICD-10-CM | POA: Diagnosis not present

## 2020-06-29 DIAGNOSIS — G2581 Restless legs syndrome: Secondary | ICD-10-CM | POA: Diagnosis not present

## 2020-06-30 DIAGNOSIS — S2242XD Multiple fractures of ribs, left side, subsequent encounter for fracture with routine healing: Secondary | ICD-10-CM | POA: Diagnosis not present

## 2020-06-30 DIAGNOSIS — H401190 Primary open-angle glaucoma, unspecified eye, stage unspecified: Secondary | ICD-10-CM | POA: Diagnosis not present

## 2020-06-30 DIAGNOSIS — S22080D Wedge compression fracture of T11-T12 vertebra, subsequent encounter for fracture with routine healing: Secondary | ICD-10-CM | POA: Diagnosis not present

## 2020-06-30 DIAGNOSIS — F3110 Bipolar disorder, current episode manic without psychotic features, unspecified: Secondary | ICD-10-CM | POA: Diagnosis not present

## 2020-06-30 DIAGNOSIS — I480 Paroxysmal atrial fibrillation: Secondary | ICD-10-CM | POA: Diagnosis not present

## 2020-06-30 DIAGNOSIS — G2581 Restless legs syndrome: Secondary | ICD-10-CM | POA: Diagnosis not present

## 2020-06-30 DIAGNOSIS — I10 Essential (primary) hypertension: Secondary | ICD-10-CM | POA: Diagnosis not present

## 2020-06-30 DIAGNOSIS — H9190 Unspecified hearing loss, unspecified ear: Secondary | ICD-10-CM | POA: Diagnosis not present

## 2020-06-30 DIAGNOSIS — E7800 Pure hypercholesterolemia, unspecified: Secondary | ICD-10-CM | POA: Diagnosis not present

## 2020-07-02 DIAGNOSIS — Z7901 Long term (current) use of anticoagulants: Secondary | ICD-10-CM | POA: Diagnosis not present

## 2020-07-02 DIAGNOSIS — G2581 Restless legs syndrome: Secondary | ICD-10-CM | POA: Diagnosis not present

## 2020-07-02 DIAGNOSIS — N3941 Urge incontinence: Secondary | ICD-10-CM | POA: Diagnosis not present

## 2020-07-02 DIAGNOSIS — M170 Bilateral primary osteoarthritis of knee: Secondary | ICD-10-CM | POA: Diagnosis not present

## 2020-07-02 DIAGNOSIS — I10 Essential (primary) hypertension: Secondary | ICD-10-CM | POA: Diagnosis not present

## 2020-07-02 DIAGNOSIS — R4189 Other symptoms and signs involving cognitive functions and awareness: Secondary | ICD-10-CM | POA: Diagnosis not present

## 2020-07-02 DIAGNOSIS — I480 Paroxysmal atrial fibrillation: Secondary | ICD-10-CM | POA: Diagnosis not present

## 2020-07-02 DIAGNOSIS — M549 Dorsalgia, unspecified: Secondary | ICD-10-CM | POA: Diagnosis not present

## 2020-07-05 DIAGNOSIS — Z7901 Long term (current) use of anticoagulants: Secondary | ICD-10-CM | POA: Diagnosis not present

## 2020-07-06 DIAGNOSIS — S2242XD Multiple fractures of ribs, left side, subsequent encounter for fracture with routine healing: Secondary | ICD-10-CM | POA: Diagnosis not present

## 2020-07-06 DIAGNOSIS — F3110 Bipolar disorder, current episode manic without psychotic features, unspecified: Secondary | ICD-10-CM | POA: Diagnosis not present

## 2020-07-06 DIAGNOSIS — I10 Essential (primary) hypertension: Secondary | ICD-10-CM | POA: Diagnosis not present

## 2020-07-06 DIAGNOSIS — H9190 Unspecified hearing loss, unspecified ear: Secondary | ICD-10-CM | POA: Diagnosis not present

## 2020-07-06 DIAGNOSIS — H401190 Primary open-angle glaucoma, unspecified eye, stage unspecified: Secondary | ICD-10-CM | POA: Diagnosis not present

## 2020-07-06 DIAGNOSIS — G2581 Restless legs syndrome: Secondary | ICD-10-CM | POA: Diagnosis not present

## 2020-07-06 DIAGNOSIS — S22080D Wedge compression fracture of T11-T12 vertebra, subsequent encounter for fracture with routine healing: Secondary | ICD-10-CM | POA: Diagnosis not present

## 2020-07-06 DIAGNOSIS — I480 Paroxysmal atrial fibrillation: Secondary | ICD-10-CM | POA: Diagnosis not present

## 2020-07-06 DIAGNOSIS — E7800 Pure hypercholesterolemia, unspecified: Secondary | ICD-10-CM | POA: Diagnosis not present

## 2020-07-09 DIAGNOSIS — I10 Essential (primary) hypertension: Secondary | ICD-10-CM | POA: Diagnosis not present

## 2020-07-09 DIAGNOSIS — I480 Paroxysmal atrial fibrillation: Secondary | ICD-10-CM | POA: Diagnosis not present

## 2020-07-09 DIAGNOSIS — S2242XD Multiple fractures of ribs, left side, subsequent encounter for fracture with routine healing: Secondary | ICD-10-CM | POA: Diagnosis not present

## 2020-07-09 DIAGNOSIS — S22080D Wedge compression fracture of T11-T12 vertebra, subsequent encounter for fracture with routine healing: Secondary | ICD-10-CM | POA: Diagnosis not present

## 2020-07-09 DIAGNOSIS — F3110 Bipolar disorder, current episode manic without psychotic features, unspecified: Secondary | ICD-10-CM | POA: Diagnosis not present

## 2020-07-09 DIAGNOSIS — H401190 Primary open-angle glaucoma, unspecified eye, stage unspecified: Secondary | ICD-10-CM | POA: Diagnosis not present

## 2020-07-09 DIAGNOSIS — H9190 Unspecified hearing loss, unspecified ear: Secondary | ICD-10-CM | POA: Diagnosis not present

## 2020-07-09 DIAGNOSIS — G2581 Restless legs syndrome: Secondary | ICD-10-CM | POA: Diagnosis not present

## 2020-07-09 DIAGNOSIS — E7800 Pure hypercholesterolemia, unspecified: Secondary | ICD-10-CM | POA: Diagnosis not present

## 2020-07-10 DIAGNOSIS — G2581 Restless legs syndrome: Secondary | ICD-10-CM | POA: Diagnosis not present

## 2020-07-10 DIAGNOSIS — F3110 Bipolar disorder, current episode manic without psychotic features, unspecified: Secondary | ICD-10-CM | POA: Diagnosis not present

## 2020-07-10 DIAGNOSIS — I480 Paroxysmal atrial fibrillation: Secondary | ICD-10-CM | POA: Diagnosis not present

## 2020-07-10 DIAGNOSIS — H9190 Unspecified hearing loss, unspecified ear: Secondary | ICD-10-CM | POA: Diagnosis not present

## 2020-07-10 DIAGNOSIS — H401190 Primary open-angle glaucoma, unspecified eye, stage unspecified: Secondary | ICD-10-CM | POA: Diagnosis not present

## 2020-07-10 DIAGNOSIS — S22080D Wedge compression fracture of T11-T12 vertebra, subsequent encounter for fracture with routine healing: Secondary | ICD-10-CM | POA: Diagnosis not present

## 2020-07-10 DIAGNOSIS — S2242XD Multiple fractures of ribs, left side, subsequent encounter for fracture with routine healing: Secondary | ICD-10-CM | POA: Diagnosis not present

## 2020-07-10 DIAGNOSIS — E7800 Pure hypercholesterolemia, unspecified: Secondary | ICD-10-CM | POA: Diagnosis not present

## 2020-07-10 DIAGNOSIS — I10 Essential (primary) hypertension: Secondary | ICD-10-CM | POA: Diagnosis not present

## 2020-07-13 DIAGNOSIS — H401190 Primary open-angle glaucoma, unspecified eye, stage unspecified: Secondary | ICD-10-CM | POA: Diagnosis not present

## 2020-07-13 DIAGNOSIS — H9190 Unspecified hearing loss, unspecified ear: Secondary | ICD-10-CM | POA: Diagnosis not present

## 2020-07-13 DIAGNOSIS — S22080D Wedge compression fracture of T11-T12 vertebra, subsequent encounter for fracture with routine healing: Secondary | ICD-10-CM | POA: Diagnosis not present

## 2020-07-13 DIAGNOSIS — G2581 Restless legs syndrome: Secondary | ICD-10-CM | POA: Diagnosis not present

## 2020-07-13 DIAGNOSIS — I480 Paroxysmal atrial fibrillation: Secondary | ICD-10-CM | POA: Diagnosis not present

## 2020-07-13 DIAGNOSIS — E7800 Pure hypercholesterolemia, unspecified: Secondary | ICD-10-CM | POA: Diagnosis not present

## 2020-07-13 DIAGNOSIS — F3110 Bipolar disorder, current episode manic without psychotic features, unspecified: Secondary | ICD-10-CM | POA: Diagnosis not present

## 2020-07-13 DIAGNOSIS — I10 Essential (primary) hypertension: Secondary | ICD-10-CM | POA: Diagnosis not present

## 2020-07-13 DIAGNOSIS — S2242XD Multiple fractures of ribs, left side, subsequent encounter for fracture with routine healing: Secondary | ICD-10-CM | POA: Diagnosis not present

## 2020-07-15 DIAGNOSIS — M545 Low back pain, unspecified: Secondary | ICD-10-CM | POA: Diagnosis not present

## 2020-07-15 DIAGNOSIS — M6281 Muscle weakness (generalized): Secondary | ICD-10-CM | POA: Diagnosis not present

## 2020-07-15 DIAGNOSIS — S22080D Wedge compression fracture of T11-T12 vertebra, subsequent encounter for fracture with routine healing: Secondary | ICD-10-CM | POA: Diagnosis not present

## 2020-07-15 DIAGNOSIS — M546 Pain in thoracic spine: Secondary | ICD-10-CM | POA: Diagnosis not present

## 2020-07-16 DIAGNOSIS — G2581 Restless legs syndrome: Secondary | ICD-10-CM | POA: Diagnosis not present

## 2020-07-16 DIAGNOSIS — I480 Paroxysmal atrial fibrillation: Secondary | ICD-10-CM | POA: Diagnosis not present

## 2020-07-16 DIAGNOSIS — S22080D Wedge compression fracture of T11-T12 vertebra, subsequent encounter for fracture with routine healing: Secondary | ICD-10-CM | POA: Diagnosis not present

## 2020-07-16 DIAGNOSIS — E7800 Pure hypercholesterolemia, unspecified: Secondary | ICD-10-CM | POA: Diagnosis not present

## 2020-07-16 DIAGNOSIS — I10 Essential (primary) hypertension: Secondary | ICD-10-CM | POA: Diagnosis not present

## 2020-07-16 DIAGNOSIS — S2242XD Multiple fractures of ribs, left side, subsequent encounter for fracture with routine healing: Secondary | ICD-10-CM | POA: Diagnosis not present

## 2020-07-16 DIAGNOSIS — H401190 Primary open-angle glaucoma, unspecified eye, stage unspecified: Secondary | ICD-10-CM | POA: Diagnosis not present

## 2020-07-16 DIAGNOSIS — F3110 Bipolar disorder, current episode manic without psychotic features, unspecified: Secondary | ICD-10-CM | POA: Diagnosis not present

## 2020-07-16 DIAGNOSIS — H9190 Unspecified hearing loss, unspecified ear: Secondary | ICD-10-CM | POA: Diagnosis not present

## 2020-07-20 DIAGNOSIS — I10 Essential (primary) hypertension: Secondary | ICD-10-CM | POA: Diagnosis not present

## 2020-07-20 DIAGNOSIS — G2581 Restless legs syndrome: Secondary | ICD-10-CM | POA: Diagnosis not present

## 2020-07-20 DIAGNOSIS — S2242XD Multiple fractures of ribs, left side, subsequent encounter for fracture with routine healing: Secondary | ICD-10-CM | POA: Diagnosis not present

## 2020-07-20 DIAGNOSIS — E7800 Pure hypercholesterolemia, unspecified: Secondary | ICD-10-CM | POA: Diagnosis not present

## 2020-07-20 DIAGNOSIS — H9190 Unspecified hearing loss, unspecified ear: Secondary | ICD-10-CM | POA: Diagnosis not present

## 2020-07-20 DIAGNOSIS — F3110 Bipolar disorder, current episode manic without psychotic features, unspecified: Secondary | ICD-10-CM | POA: Diagnosis not present

## 2020-07-20 DIAGNOSIS — I480 Paroxysmal atrial fibrillation: Secondary | ICD-10-CM | POA: Diagnosis not present

## 2020-07-20 DIAGNOSIS — S22080D Wedge compression fracture of T11-T12 vertebra, subsequent encounter for fracture with routine healing: Secondary | ICD-10-CM | POA: Diagnosis not present

## 2020-07-20 DIAGNOSIS — H401190 Primary open-angle glaucoma, unspecified eye, stage unspecified: Secondary | ICD-10-CM | POA: Diagnosis not present

## 2020-07-21 DIAGNOSIS — E7800 Pure hypercholesterolemia, unspecified: Secondary | ICD-10-CM | POA: Diagnosis not present

## 2020-07-21 DIAGNOSIS — I10 Essential (primary) hypertension: Secondary | ICD-10-CM | POA: Diagnosis not present

## 2020-07-21 DIAGNOSIS — H401190 Primary open-angle glaucoma, unspecified eye, stage unspecified: Secondary | ICD-10-CM | POA: Diagnosis not present

## 2020-07-21 DIAGNOSIS — G2581 Restless legs syndrome: Secondary | ICD-10-CM | POA: Diagnosis not present

## 2020-07-21 DIAGNOSIS — S22080D Wedge compression fracture of T11-T12 vertebra, subsequent encounter for fracture with routine healing: Secondary | ICD-10-CM | POA: Diagnosis not present

## 2020-07-21 DIAGNOSIS — S2242XD Multiple fractures of ribs, left side, subsequent encounter for fracture with routine healing: Secondary | ICD-10-CM | POA: Diagnosis not present

## 2020-07-21 DIAGNOSIS — F3110 Bipolar disorder, current episode manic without psychotic features, unspecified: Secondary | ICD-10-CM | POA: Diagnosis not present

## 2020-07-21 DIAGNOSIS — I480 Paroxysmal atrial fibrillation: Secondary | ICD-10-CM | POA: Diagnosis not present

## 2020-07-21 DIAGNOSIS — H9190 Unspecified hearing loss, unspecified ear: Secondary | ICD-10-CM | POA: Diagnosis not present

## 2020-07-22 DIAGNOSIS — H401190 Primary open-angle glaucoma, unspecified eye, stage unspecified: Secondary | ICD-10-CM | POA: Diagnosis not present

## 2020-07-22 DIAGNOSIS — H9190 Unspecified hearing loss, unspecified ear: Secondary | ICD-10-CM | POA: Diagnosis not present

## 2020-07-22 DIAGNOSIS — E7800 Pure hypercholesterolemia, unspecified: Secondary | ICD-10-CM | POA: Diagnosis not present

## 2020-07-22 DIAGNOSIS — F3110 Bipolar disorder, current episode manic without psychotic features, unspecified: Secondary | ICD-10-CM | POA: Diagnosis not present

## 2020-07-22 DIAGNOSIS — I480 Paroxysmal atrial fibrillation: Secondary | ICD-10-CM | POA: Diagnosis not present

## 2020-07-22 DIAGNOSIS — L03115 Cellulitis of right lower limb: Secondary | ICD-10-CM | POA: Diagnosis not present

## 2020-07-22 DIAGNOSIS — S22080D Wedge compression fracture of T11-T12 vertebra, subsequent encounter for fracture with routine healing: Secondary | ICD-10-CM | POA: Diagnosis not present

## 2020-07-22 DIAGNOSIS — S81801A Unspecified open wound, right lower leg, initial encounter: Secondary | ICD-10-CM | POA: Diagnosis not present

## 2020-07-22 DIAGNOSIS — Z7901 Long term (current) use of anticoagulants: Secondary | ICD-10-CM | POA: Diagnosis not present

## 2020-07-22 DIAGNOSIS — I10 Essential (primary) hypertension: Secondary | ICD-10-CM | POA: Diagnosis not present

## 2020-07-22 DIAGNOSIS — S2242XD Multiple fractures of ribs, left side, subsequent encounter for fracture with routine healing: Secondary | ICD-10-CM | POA: Diagnosis not present

## 2020-07-22 DIAGNOSIS — G2581 Restless legs syndrome: Secondary | ICD-10-CM | POA: Diagnosis not present

## 2020-07-27 DIAGNOSIS — H401190 Primary open-angle glaucoma, unspecified eye, stage unspecified: Secondary | ICD-10-CM | POA: Diagnosis not present

## 2020-07-27 DIAGNOSIS — G2581 Restless legs syndrome: Secondary | ICD-10-CM | POA: Diagnosis not present

## 2020-07-27 DIAGNOSIS — H9190 Unspecified hearing loss, unspecified ear: Secondary | ICD-10-CM | POA: Diagnosis not present

## 2020-07-27 DIAGNOSIS — S2242XD Multiple fractures of ribs, left side, subsequent encounter for fracture with routine healing: Secondary | ICD-10-CM | POA: Diagnosis not present

## 2020-07-27 DIAGNOSIS — I10 Essential (primary) hypertension: Secondary | ICD-10-CM | POA: Diagnosis not present

## 2020-07-27 DIAGNOSIS — F3110 Bipolar disorder, current episode manic without psychotic features, unspecified: Secondary | ICD-10-CM | POA: Diagnosis not present

## 2020-07-27 DIAGNOSIS — I480 Paroxysmal atrial fibrillation: Secondary | ICD-10-CM | POA: Diagnosis not present

## 2020-07-27 DIAGNOSIS — E7800 Pure hypercholesterolemia, unspecified: Secondary | ICD-10-CM | POA: Diagnosis not present

## 2020-07-27 DIAGNOSIS — S22080D Wedge compression fracture of T11-T12 vertebra, subsequent encounter for fracture with routine healing: Secondary | ICD-10-CM | POA: Diagnosis not present

## 2020-07-28 DIAGNOSIS — S2242XD Multiple fractures of ribs, left side, subsequent encounter for fracture with routine healing: Secondary | ICD-10-CM | POA: Diagnosis not present

## 2020-07-28 DIAGNOSIS — I10 Essential (primary) hypertension: Secondary | ICD-10-CM | POA: Diagnosis not present

## 2020-07-28 DIAGNOSIS — F3110 Bipolar disorder, current episode manic without psychotic features, unspecified: Secondary | ICD-10-CM | POA: Diagnosis not present

## 2020-07-28 DIAGNOSIS — I480 Paroxysmal atrial fibrillation: Secondary | ICD-10-CM | POA: Diagnosis not present

## 2020-07-28 DIAGNOSIS — E7800 Pure hypercholesterolemia, unspecified: Secondary | ICD-10-CM | POA: Diagnosis not present

## 2020-07-28 DIAGNOSIS — H9190 Unspecified hearing loss, unspecified ear: Secondary | ICD-10-CM | POA: Diagnosis not present

## 2020-07-28 DIAGNOSIS — S22080D Wedge compression fracture of T11-T12 vertebra, subsequent encounter for fracture with routine healing: Secondary | ICD-10-CM | POA: Diagnosis not present

## 2020-07-28 DIAGNOSIS — G2581 Restless legs syndrome: Secondary | ICD-10-CM | POA: Diagnosis not present

## 2020-07-28 DIAGNOSIS — H401190 Primary open-angle glaucoma, unspecified eye, stage unspecified: Secondary | ICD-10-CM | POA: Diagnosis not present

## 2020-07-29 DIAGNOSIS — H9190 Unspecified hearing loss, unspecified ear: Secondary | ICD-10-CM | POA: Diagnosis not present

## 2020-07-29 DIAGNOSIS — E7800 Pure hypercholesterolemia, unspecified: Secondary | ICD-10-CM | POA: Diagnosis not present

## 2020-07-29 DIAGNOSIS — S2242XD Multiple fractures of ribs, left side, subsequent encounter for fracture with routine healing: Secondary | ICD-10-CM | POA: Diagnosis not present

## 2020-07-29 DIAGNOSIS — I480 Paroxysmal atrial fibrillation: Secondary | ICD-10-CM | POA: Diagnosis not present

## 2020-07-29 DIAGNOSIS — G2581 Restless legs syndrome: Secondary | ICD-10-CM | POA: Diagnosis not present

## 2020-07-29 DIAGNOSIS — F3110 Bipolar disorder, current episode manic without psychotic features, unspecified: Secondary | ICD-10-CM | POA: Diagnosis not present

## 2020-07-29 DIAGNOSIS — I10 Essential (primary) hypertension: Secondary | ICD-10-CM | POA: Diagnosis not present

## 2020-07-29 DIAGNOSIS — S22080D Wedge compression fracture of T11-T12 vertebra, subsequent encounter for fracture with routine healing: Secondary | ICD-10-CM | POA: Diagnosis not present

## 2020-07-29 DIAGNOSIS — H401190 Primary open-angle glaucoma, unspecified eye, stage unspecified: Secondary | ICD-10-CM | POA: Diagnosis not present

## 2020-08-02 DIAGNOSIS — H401190 Primary open-angle glaucoma, unspecified eye, stage unspecified: Secondary | ICD-10-CM | POA: Diagnosis not present

## 2020-08-02 DIAGNOSIS — S22080D Wedge compression fracture of T11-T12 vertebra, subsequent encounter for fracture with routine healing: Secondary | ICD-10-CM | POA: Diagnosis not present

## 2020-08-02 DIAGNOSIS — E7800 Pure hypercholesterolemia, unspecified: Secondary | ICD-10-CM | POA: Diagnosis not present

## 2020-08-02 DIAGNOSIS — I480 Paroxysmal atrial fibrillation: Secondary | ICD-10-CM | POA: Diagnosis not present

## 2020-08-02 DIAGNOSIS — F3110 Bipolar disorder, current episode manic without psychotic features, unspecified: Secondary | ICD-10-CM | POA: Diagnosis not present

## 2020-08-02 DIAGNOSIS — H9190 Unspecified hearing loss, unspecified ear: Secondary | ICD-10-CM | POA: Diagnosis not present

## 2020-08-02 DIAGNOSIS — S2242XD Multiple fractures of ribs, left side, subsequent encounter for fracture with routine healing: Secondary | ICD-10-CM | POA: Diagnosis not present

## 2020-08-02 DIAGNOSIS — I10 Essential (primary) hypertension: Secondary | ICD-10-CM | POA: Diagnosis not present

## 2020-08-02 DIAGNOSIS — G2581 Restless legs syndrome: Secondary | ICD-10-CM | POA: Diagnosis not present

## 2020-08-03 DIAGNOSIS — F3110 Bipolar disorder, current episode manic without psychotic features, unspecified: Secondary | ICD-10-CM | POA: Diagnosis not present

## 2020-08-03 DIAGNOSIS — I480 Paroxysmal atrial fibrillation: Secondary | ICD-10-CM | POA: Diagnosis not present

## 2020-08-03 DIAGNOSIS — E7800 Pure hypercholesterolemia, unspecified: Secondary | ICD-10-CM | POA: Diagnosis not present

## 2020-08-03 DIAGNOSIS — S22080D Wedge compression fracture of T11-T12 vertebra, subsequent encounter for fracture with routine healing: Secondary | ICD-10-CM | POA: Diagnosis not present

## 2020-08-03 DIAGNOSIS — I10 Essential (primary) hypertension: Secondary | ICD-10-CM | POA: Diagnosis not present

## 2020-08-03 DIAGNOSIS — H401190 Primary open-angle glaucoma, unspecified eye, stage unspecified: Secondary | ICD-10-CM | POA: Diagnosis not present

## 2020-08-03 DIAGNOSIS — H9190 Unspecified hearing loss, unspecified ear: Secondary | ICD-10-CM | POA: Diagnosis not present

## 2020-08-03 DIAGNOSIS — S2242XD Multiple fractures of ribs, left side, subsequent encounter for fracture with routine healing: Secondary | ICD-10-CM | POA: Diagnosis not present

## 2020-08-03 DIAGNOSIS — G2581 Restless legs syndrome: Secondary | ICD-10-CM | POA: Diagnosis not present

## 2020-08-05 DIAGNOSIS — H04123 Dry eye syndrome of bilateral lacrimal glands: Secondary | ICD-10-CM | POA: Diagnosis not present

## 2020-08-05 DIAGNOSIS — R6889 Other general symptoms and signs: Secondary | ICD-10-CM | POA: Diagnosis not present

## 2020-08-05 DIAGNOSIS — H401121 Primary open-angle glaucoma, left eye, mild stage: Secondary | ICD-10-CM | POA: Diagnosis not present

## 2020-08-05 DIAGNOSIS — H401113 Primary open-angle glaucoma, right eye, severe stage: Secondary | ICD-10-CM | POA: Diagnosis not present

## 2020-08-06 DIAGNOSIS — S2242XD Multiple fractures of ribs, left side, subsequent encounter for fracture with routine healing: Secondary | ICD-10-CM | POA: Diagnosis not present

## 2020-08-06 DIAGNOSIS — F3110 Bipolar disorder, current episode manic without psychotic features, unspecified: Secondary | ICD-10-CM | POA: Diagnosis not present

## 2020-08-06 DIAGNOSIS — H9190 Unspecified hearing loss, unspecified ear: Secondary | ICD-10-CM | POA: Diagnosis not present

## 2020-08-06 DIAGNOSIS — H401190 Primary open-angle glaucoma, unspecified eye, stage unspecified: Secondary | ICD-10-CM | POA: Diagnosis not present

## 2020-08-06 DIAGNOSIS — I480 Paroxysmal atrial fibrillation: Secondary | ICD-10-CM | POA: Diagnosis not present

## 2020-08-06 DIAGNOSIS — I10 Essential (primary) hypertension: Secondary | ICD-10-CM | POA: Diagnosis not present

## 2020-08-06 DIAGNOSIS — G2581 Restless legs syndrome: Secondary | ICD-10-CM | POA: Diagnosis not present

## 2020-08-06 DIAGNOSIS — S22080D Wedge compression fracture of T11-T12 vertebra, subsequent encounter for fracture with routine healing: Secondary | ICD-10-CM | POA: Diagnosis not present

## 2020-08-06 DIAGNOSIS — E7800 Pure hypercholesterolemia, unspecified: Secondary | ICD-10-CM | POA: Diagnosis not present

## 2020-08-15 DIAGNOSIS — M546 Pain in thoracic spine: Secondary | ICD-10-CM | POA: Diagnosis not present

## 2020-08-15 DIAGNOSIS — M6281 Muscle weakness (generalized): Secondary | ICD-10-CM | POA: Diagnosis not present

## 2020-08-15 DIAGNOSIS — S22080D Wedge compression fracture of T11-T12 vertebra, subsequent encounter for fracture with routine healing: Secondary | ICD-10-CM | POA: Diagnosis not present

## 2020-08-15 DIAGNOSIS — M545 Low back pain, unspecified: Secondary | ICD-10-CM | POA: Diagnosis not present

## 2020-08-19 DIAGNOSIS — Z7901 Long term (current) use of anticoagulants: Secondary | ICD-10-CM | POA: Diagnosis not present

## 2020-08-20 DIAGNOSIS — H401190 Primary open-angle glaucoma, unspecified eye, stage unspecified: Secondary | ICD-10-CM | POA: Diagnosis not present

## 2020-08-20 DIAGNOSIS — I10 Essential (primary) hypertension: Secondary | ICD-10-CM | POA: Diagnosis not present

## 2020-08-20 DIAGNOSIS — H9190 Unspecified hearing loss, unspecified ear: Secondary | ICD-10-CM | POA: Diagnosis not present

## 2020-08-20 DIAGNOSIS — I480 Paroxysmal atrial fibrillation: Secondary | ICD-10-CM | POA: Diagnosis not present

## 2020-08-20 DIAGNOSIS — S2242XD Multiple fractures of ribs, left side, subsequent encounter for fracture with routine healing: Secondary | ICD-10-CM | POA: Diagnosis not present

## 2020-08-20 DIAGNOSIS — G2581 Restless legs syndrome: Secondary | ICD-10-CM | POA: Diagnosis not present

## 2020-08-20 DIAGNOSIS — S22080D Wedge compression fracture of T11-T12 vertebra, subsequent encounter for fracture with routine healing: Secondary | ICD-10-CM | POA: Diagnosis not present

## 2020-08-20 DIAGNOSIS — F3110 Bipolar disorder, current episode manic without psychotic features, unspecified: Secondary | ICD-10-CM | POA: Diagnosis not present

## 2020-08-20 DIAGNOSIS — E7800 Pure hypercholesterolemia, unspecified: Secondary | ICD-10-CM | POA: Diagnosis not present

## 2020-09-14 DIAGNOSIS — M6281 Muscle weakness (generalized): Secondary | ICD-10-CM | POA: Diagnosis not present

## 2020-09-14 DIAGNOSIS — M545 Low back pain, unspecified: Secondary | ICD-10-CM | POA: Diagnosis not present

## 2020-09-14 DIAGNOSIS — M546 Pain in thoracic spine: Secondary | ICD-10-CM | POA: Diagnosis not present

## 2020-09-14 DIAGNOSIS — S22080D Wedge compression fracture of T11-T12 vertebra, subsequent encounter for fracture with routine healing: Secondary | ICD-10-CM | POA: Diagnosis not present

## 2020-09-16 DIAGNOSIS — Z7901 Long term (current) use of anticoagulants: Secondary | ICD-10-CM | POA: Diagnosis not present

## 2020-09-28 DIAGNOSIS — I480 Paroxysmal atrial fibrillation: Secondary | ICD-10-CM | POA: Diagnosis not present

## 2020-09-28 DIAGNOSIS — H4089 Other specified glaucoma: Secondary | ICD-10-CM | POA: Diagnosis not present

## 2020-09-28 DIAGNOSIS — N3941 Urge incontinence: Secondary | ICD-10-CM | POA: Diagnosis not present

## 2020-09-28 DIAGNOSIS — I10 Essential (primary) hypertension: Secondary | ICD-10-CM | POA: Diagnosis not present

## 2020-09-28 DIAGNOSIS — Z7901 Long term (current) use of anticoagulants: Secondary | ICD-10-CM | POA: Diagnosis not present

## 2020-09-28 DIAGNOSIS — G2581 Restless legs syndrome: Secondary | ICD-10-CM | POA: Diagnosis not present

## 2020-09-28 DIAGNOSIS — I6789 Other cerebrovascular disease: Secondary | ICD-10-CM | POA: Diagnosis not present

## 2020-09-28 DIAGNOSIS — M1990 Unspecified osteoarthritis, unspecified site: Secondary | ICD-10-CM | POA: Diagnosis not present

## 2020-09-28 DIAGNOSIS — M170 Bilateral primary osteoarthritis of knee: Secondary | ICD-10-CM | POA: Diagnosis not present

## 2020-10-05 DIAGNOSIS — M549 Dorsalgia, unspecified: Secondary | ICD-10-CM | POA: Diagnosis not present

## 2020-10-05 DIAGNOSIS — M810 Age-related osteoporosis without current pathological fracture: Secondary | ICD-10-CM | POA: Diagnosis not present

## 2020-10-05 DIAGNOSIS — I480 Paroxysmal atrial fibrillation: Secondary | ICD-10-CM | POA: Diagnosis not present

## 2020-10-05 DIAGNOSIS — G2581 Restless legs syndrome: Secondary | ICD-10-CM | POA: Diagnosis not present

## 2020-10-05 DIAGNOSIS — H4089 Other specified glaucoma: Secondary | ICD-10-CM | POA: Diagnosis not present

## 2020-10-05 DIAGNOSIS — I10 Essential (primary) hypertension: Secondary | ICD-10-CM | POA: Diagnosis not present

## 2020-10-05 DIAGNOSIS — Z7901 Long term (current) use of anticoagulants: Secondary | ICD-10-CM | POA: Diagnosis not present

## 2020-10-05 DIAGNOSIS — Z23 Encounter for immunization: Secondary | ICD-10-CM | POA: Diagnosis not present

## 2020-10-14 DIAGNOSIS — I480 Paroxysmal atrial fibrillation: Secondary | ICD-10-CM | POA: Diagnosis not present

## 2020-10-14 DIAGNOSIS — Z7901 Long term (current) use of anticoagulants: Secondary | ICD-10-CM | POA: Diagnosis not present

## 2020-10-15 DIAGNOSIS — M546 Pain in thoracic spine: Secondary | ICD-10-CM | POA: Diagnosis not present

## 2020-10-15 DIAGNOSIS — M545 Low back pain, unspecified: Secondary | ICD-10-CM | POA: Diagnosis not present

## 2020-10-15 DIAGNOSIS — M6281 Muscle weakness (generalized): Secondary | ICD-10-CM | POA: Diagnosis not present

## 2020-10-15 DIAGNOSIS — S22080D Wedge compression fracture of T11-T12 vertebra, subsequent encounter for fracture with routine healing: Secondary | ICD-10-CM | POA: Diagnosis not present

## 2020-10-28 DIAGNOSIS — Z7901 Long term (current) use of anticoagulants: Secondary | ICD-10-CM | POA: Diagnosis not present

## 2020-11-11 DIAGNOSIS — Z7901 Long term (current) use of anticoagulants: Secondary | ICD-10-CM | POA: Diagnosis not present

## 2020-11-11 DIAGNOSIS — M818 Other osteoporosis without current pathological fracture: Secondary | ICD-10-CM | POA: Diagnosis not present

## 2020-11-14 DIAGNOSIS — M545 Low back pain, unspecified: Secondary | ICD-10-CM | POA: Diagnosis not present

## 2020-11-14 DIAGNOSIS — S22080D Wedge compression fracture of T11-T12 vertebra, subsequent encounter for fracture with routine healing: Secondary | ICD-10-CM | POA: Diagnosis not present

## 2020-11-14 DIAGNOSIS — M546 Pain in thoracic spine: Secondary | ICD-10-CM | POA: Diagnosis not present

## 2020-11-14 DIAGNOSIS — M6281 Muscle weakness (generalized): Secondary | ICD-10-CM | POA: Diagnosis not present

## 2020-12-02 DIAGNOSIS — Z7901 Long term (current) use of anticoagulants: Secondary | ICD-10-CM | POA: Diagnosis not present

## 2020-12-02 DIAGNOSIS — R350 Frequency of micturition: Secondary | ICD-10-CM | POA: Diagnosis not present

## 2020-12-09 DIAGNOSIS — N3941 Urge incontinence: Secondary | ICD-10-CM | POA: Diagnosis not present

## 2020-12-09 DIAGNOSIS — M549 Dorsalgia, unspecified: Secondary | ICD-10-CM | POA: Diagnosis not present

## 2020-12-09 DIAGNOSIS — Z7901 Long term (current) use of anticoagulants: Secondary | ICD-10-CM | POA: Diagnosis not present

## 2020-12-15 DIAGNOSIS — M546 Pain in thoracic spine: Secondary | ICD-10-CM | POA: Diagnosis not present

## 2020-12-15 DIAGNOSIS — M545 Low back pain, unspecified: Secondary | ICD-10-CM | POA: Diagnosis not present

## 2020-12-15 DIAGNOSIS — S22080D Wedge compression fracture of T11-T12 vertebra, subsequent encounter for fracture with routine healing: Secondary | ICD-10-CM | POA: Diagnosis not present

## 2020-12-15 DIAGNOSIS — M6281 Muscle weakness (generalized): Secondary | ICD-10-CM | POA: Diagnosis not present

## 2020-12-20 DIAGNOSIS — S22000D Wedge compression fracture of unspecified thoracic vertebra, subsequent encounter for fracture with routine healing: Secondary | ICD-10-CM | POA: Diagnosis not present

## 2020-12-20 DIAGNOSIS — Z6826 Body mass index (BMI) 26.0-26.9, adult: Secondary | ICD-10-CM | POA: Diagnosis not present

## 2020-12-20 DIAGNOSIS — I10 Essential (primary) hypertension: Secondary | ICD-10-CM | POA: Diagnosis not present

## 2021-01-06 DIAGNOSIS — Z7901 Long term (current) use of anticoagulants: Secondary | ICD-10-CM | POA: Diagnosis not present

## 2021-01-15 DIAGNOSIS — S22080D Wedge compression fracture of T11-T12 vertebra, subsequent encounter for fracture with routine healing: Secondary | ICD-10-CM | POA: Diagnosis not present

## 2021-01-15 DIAGNOSIS — M6281 Muscle weakness (generalized): Secondary | ICD-10-CM | POA: Diagnosis not present

## 2021-01-15 DIAGNOSIS — M546 Pain in thoracic spine: Secondary | ICD-10-CM | POA: Diagnosis not present

## 2021-01-15 DIAGNOSIS — M545 Low back pain, unspecified: Secondary | ICD-10-CM | POA: Diagnosis not present

## 2021-01-26 DIAGNOSIS — Z6825 Body mass index (BMI) 25.0-25.9, adult: Secondary | ICD-10-CM | POA: Diagnosis not present

## 2021-01-26 DIAGNOSIS — H9202 Otalgia, left ear: Secondary | ICD-10-CM | POA: Diagnosis not present

## 2021-01-26 DIAGNOSIS — R21 Rash and other nonspecific skin eruption: Secondary | ICD-10-CM | POA: Diagnosis not present

## 2021-01-27 DIAGNOSIS — H6092 Unspecified otitis externa, left ear: Secondary | ICD-10-CM | POA: Diagnosis not present

## 2021-01-27 DIAGNOSIS — Z6823 Body mass index (BMI) 23.0-23.9, adult: Secondary | ICD-10-CM | POA: Diagnosis not present

## 2021-02-03 DIAGNOSIS — Z7901 Long term (current) use of anticoagulants: Secondary | ICD-10-CM | POA: Diagnosis not present

## 2021-02-11 DIAGNOSIS — I700 Atherosclerosis of aorta: Secondary | ICD-10-CM | POA: Diagnosis not present

## 2021-02-11 DIAGNOSIS — H6092 Unspecified otitis externa, left ear: Secondary | ICD-10-CM | POA: Diagnosis not present

## 2021-02-11 DIAGNOSIS — I10 Essential (primary) hypertension: Secondary | ICD-10-CM | POA: Diagnosis not present

## 2021-02-11 DIAGNOSIS — G2581 Restless legs syndrome: Secondary | ICD-10-CM | POA: Diagnosis not present

## 2021-02-11 DIAGNOSIS — H4089 Other specified glaucoma: Secondary | ICD-10-CM | POA: Diagnosis not present

## 2021-02-11 DIAGNOSIS — N3946 Mixed incontinence: Secondary | ICD-10-CM | POA: Diagnosis not present

## 2021-02-11 DIAGNOSIS — M810 Age-related osteoporosis without current pathological fracture: Secondary | ICD-10-CM | POA: Diagnosis not present

## 2021-02-11 DIAGNOSIS — I480 Paroxysmal atrial fibrillation: Secondary | ICD-10-CM | POA: Diagnosis not present

## 2021-02-11 DIAGNOSIS — Z79899 Other long term (current) drug therapy: Secondary | ICD-10-CM | POA: Diagnosis not present

## 2021-02-17 DIAGNOSIS — Z7901 Long term (current) use of anticoagulants: Secondary | ICD-10-CM | POA: Diagnosis not present

## 2021-02-23 IMAGING — CR DG RIBS W/ CHEST 3+V*R*
3 series · 3 of 3 positions shown · non-contrast
Comparison: 11/26/2017

CLINICAL DATA: Rib and back pain, recent T11 fracture

EXAM:
RIGHT RIBS AND CHEST - 3+ VIEW

[x chest ap (1 of 3)]
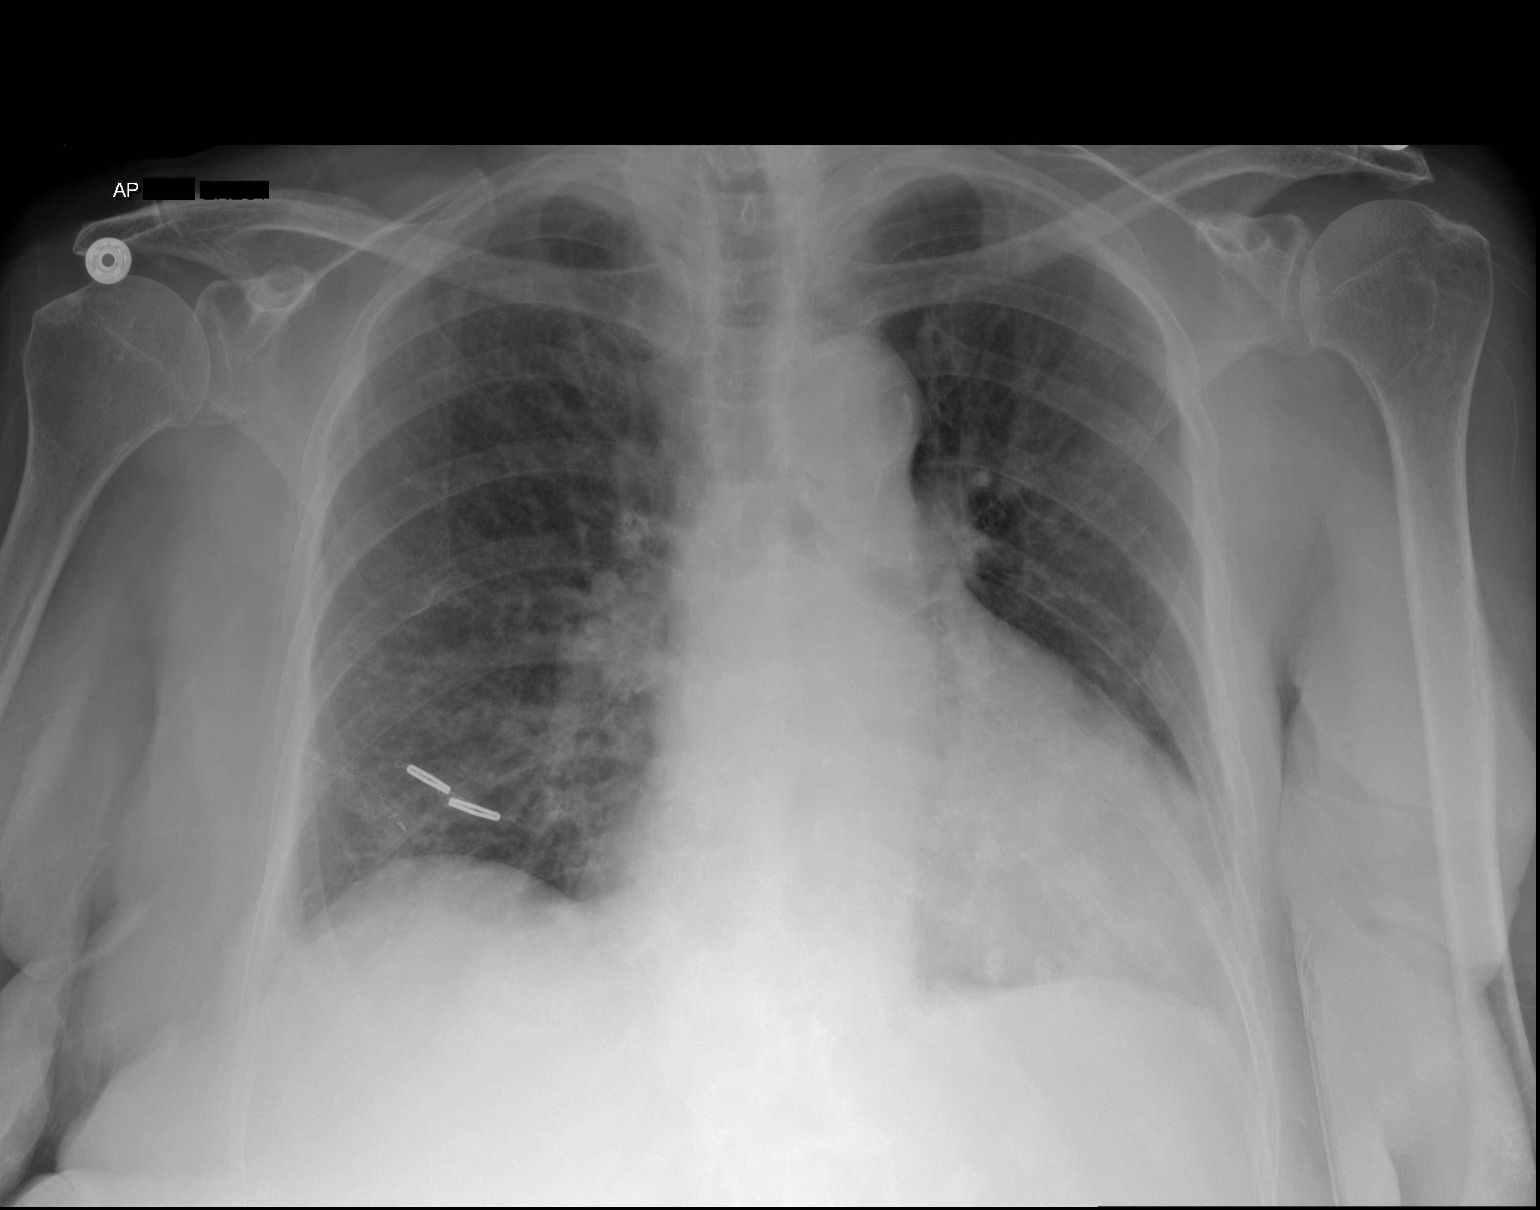

[x chest ap (2 of 3)]
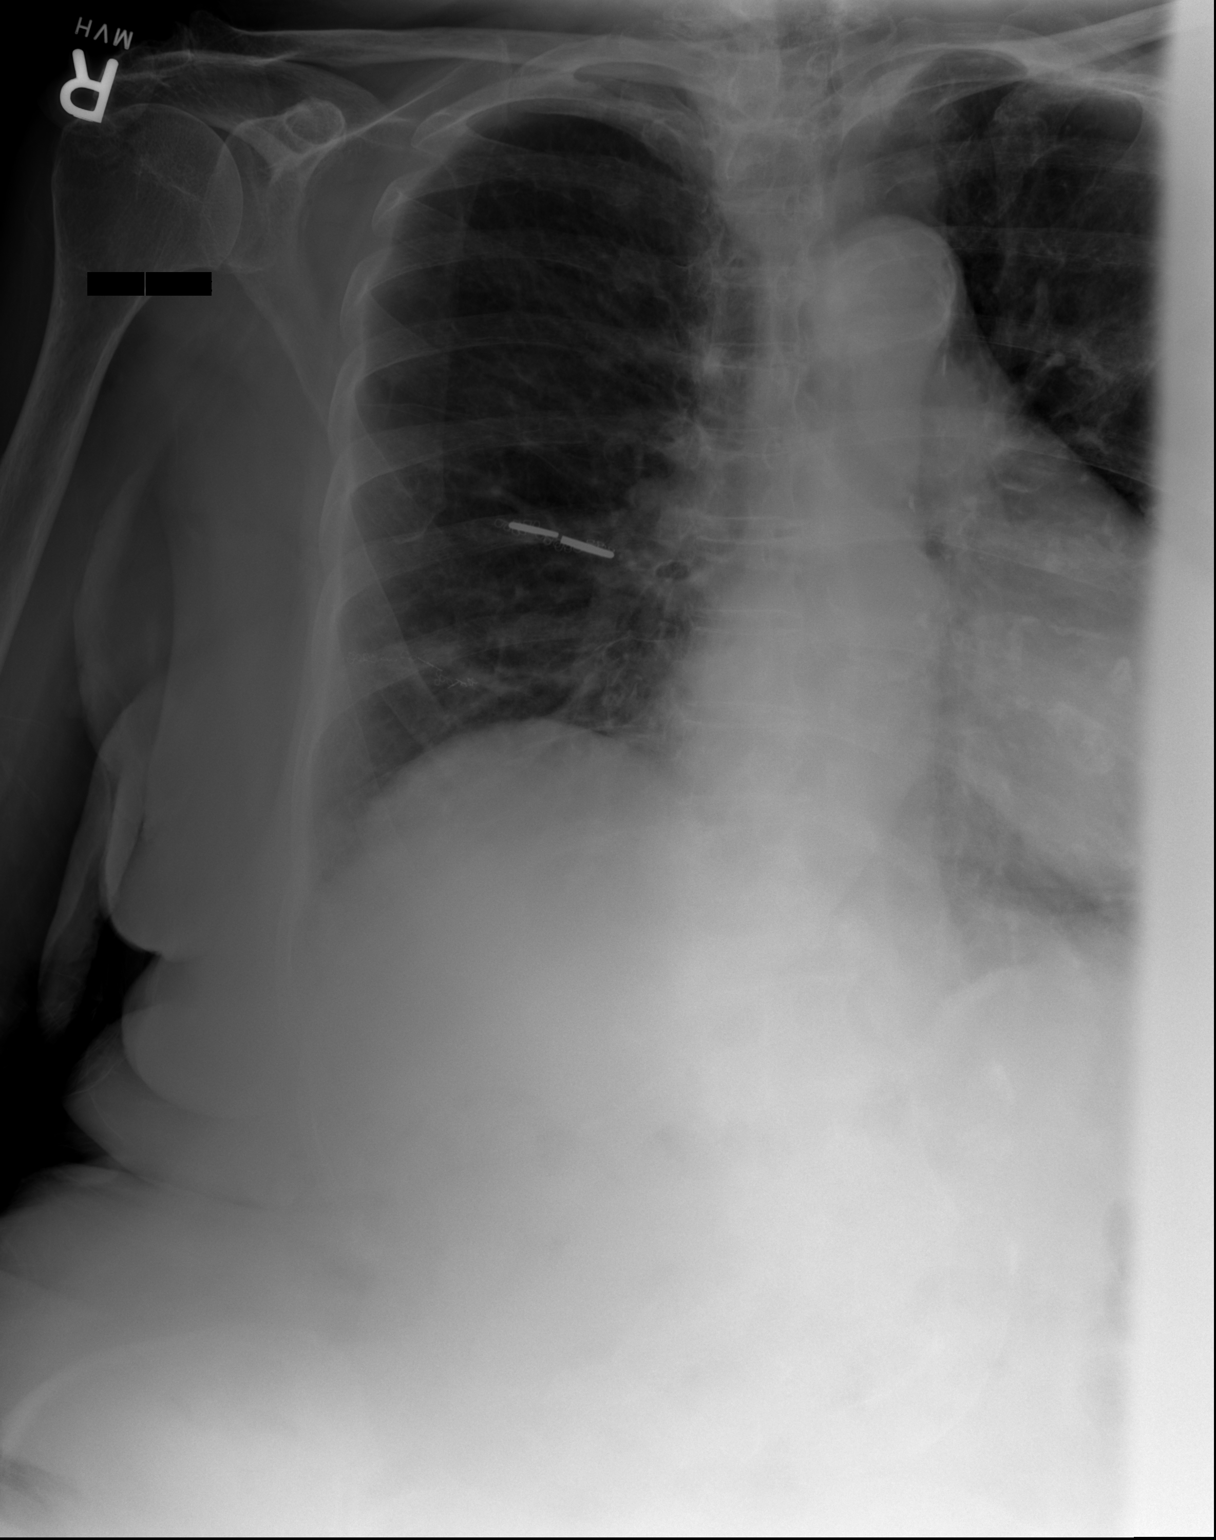

[x chest ap (3 of 3)]
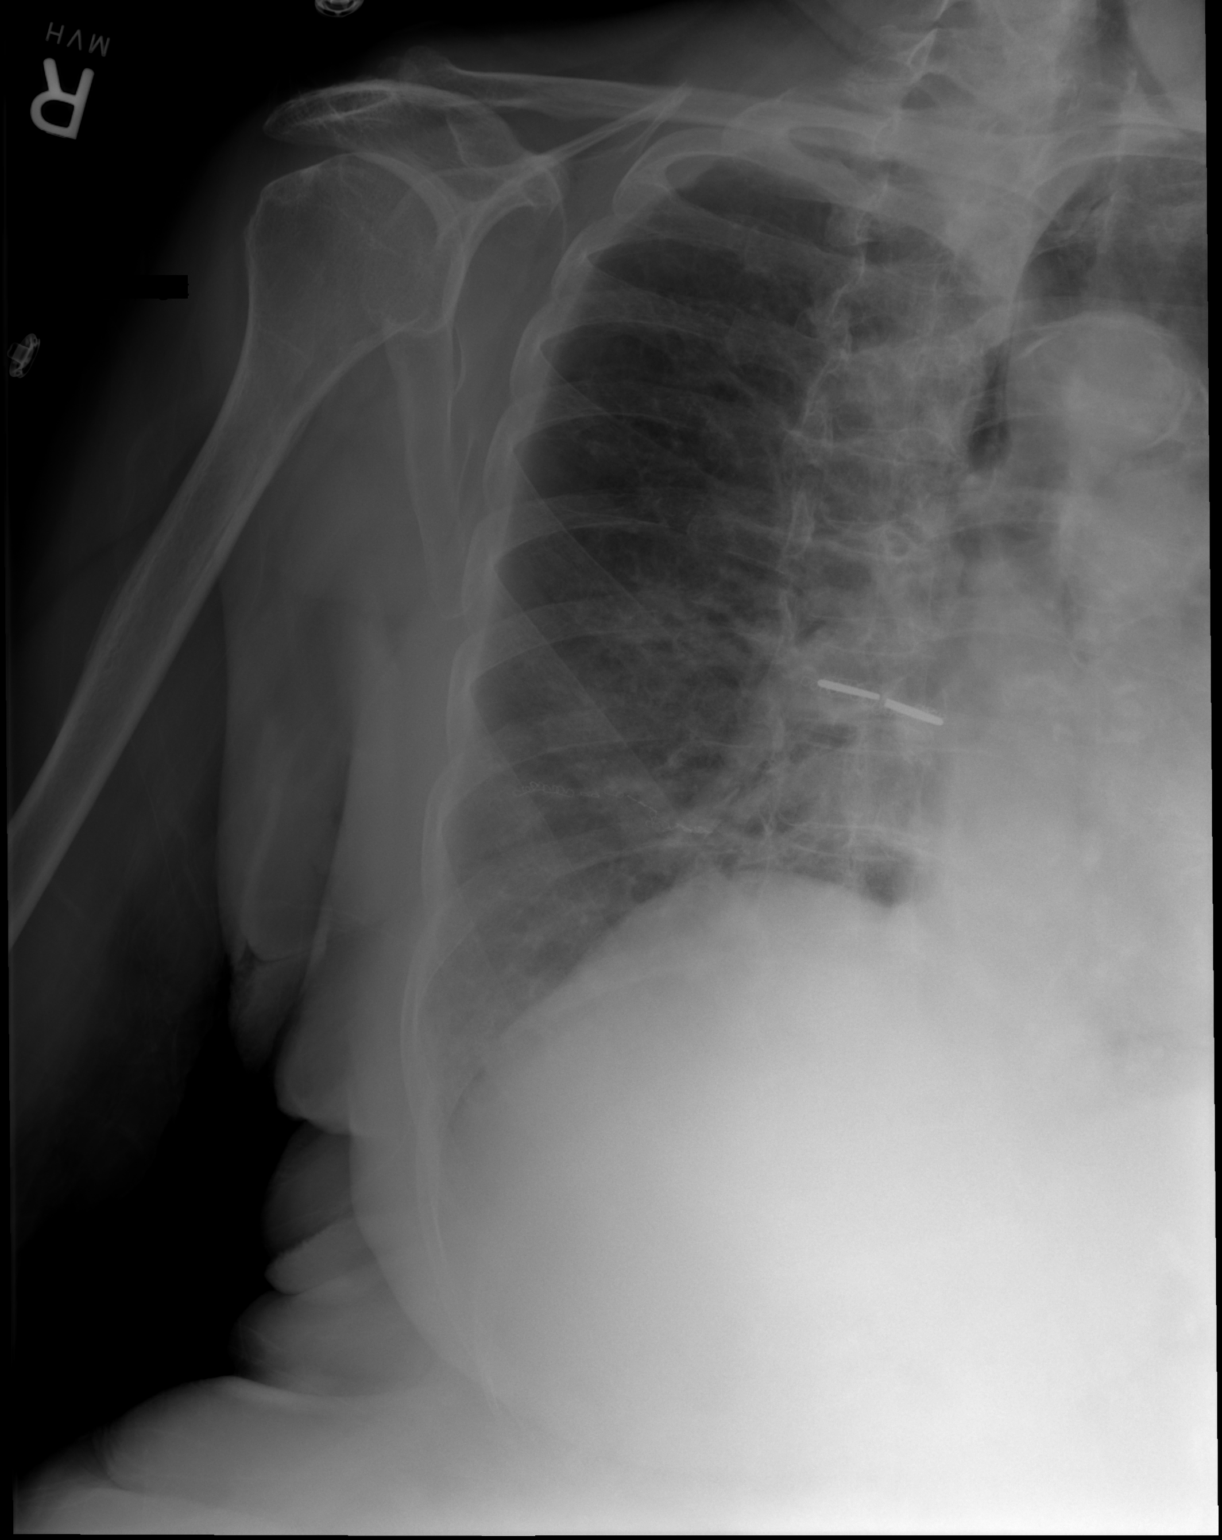

[3 of 3 positions shown; findings below may reference images not displayed]

FINDINGS: No displaced fracture or other bone lesions are seen involving the
ribs. There is no evidence of pneumothorax or pleural effusion.
Cardiomegaly. No acute appearing airspace opacity. Surgical clips
and suture project over the right lung base.
IMPRESSION: 1. No displaced rib fracture or other acute abnormality of the right
ribs. No pneumothorax or pleural effusion.
2. The thoracic spine is not well imaged on frontal and oblique
views provided per report of recent T11 fracture.
3. Cardiomegaly.
4. No acute appearing airspace opacity.

## 2021-02-23 IMAGING — MR MR THORACIC SPINE W/O CM
8 of 11 series · 34 of 48 positions shown · non-contrast
Comparison: Rib radiographs earlier 03/27/2020. Today. CT Abdomen
and Pelvis

CLINICAL DATA: [AGE] female with recent T11 fracture. Rib and
back pain.

EXAM:
MRI THORACIC AND LUMBAR SPINE WITHOUT CONTRAST
TECHNIQUE: Multiplanar and multiecho pulse sequences of the thoracic and lumbar
spine were obtained without intravenous contrast.

[Series 16: T1 · sagittal · 4.0mm · 1.72mm/px · 1 of 5 slices shown (1 of 4)]
[im 1/5]
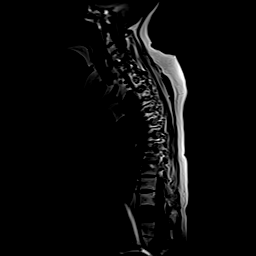

[Series 18: T1 · sagittal · 3.0mm · 1.00mm/px · 3 of 23 slices shown (2 of 4)]
[im 1/23]
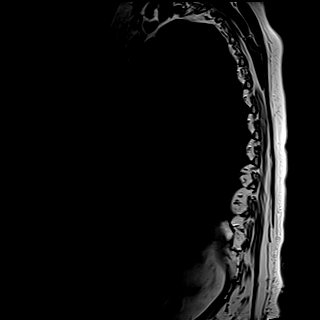
[im 12/23]
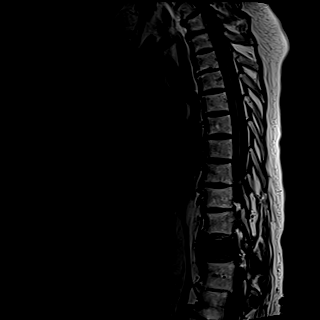
[im 23/23]
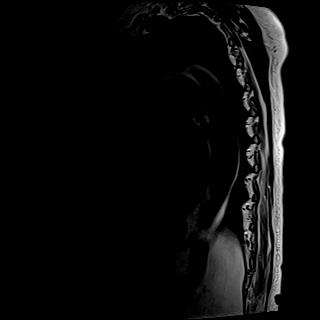

[Series 19: T2 · sagittal · 3.0mm · 0.83mm/px · 3 of 23 slices shown (1 of 4)]
[im 1/23]
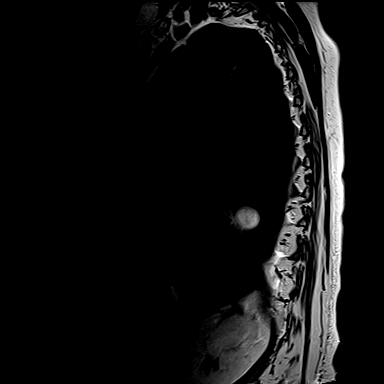
[im 12/23]
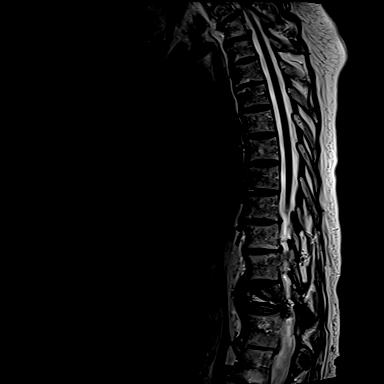
[im 23/23]
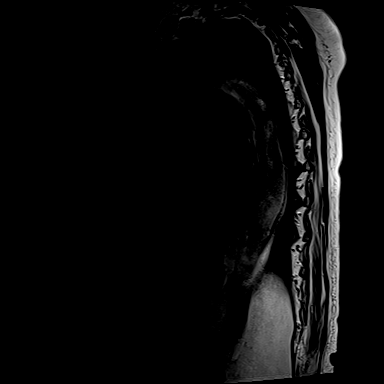

[Series 20: T2 · axial · 4.0mm · 0.78mm/px · z∈[-298,-59]mm · 7 of 45 slices shown (2 of 4)]
[im 1/45]
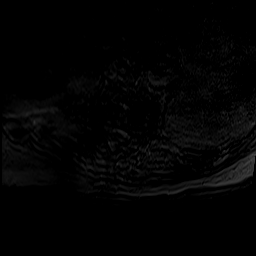
[im 8/45]
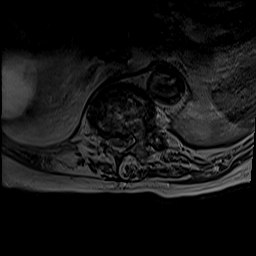
[im 15/45]
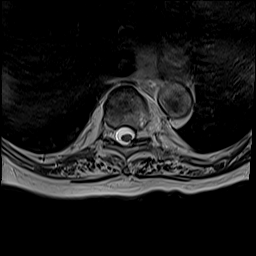
[im 23/45]
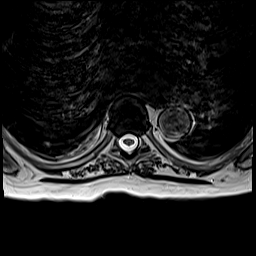
[im 30/45]
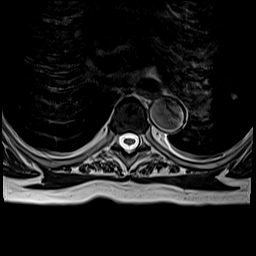
[im 37/45]
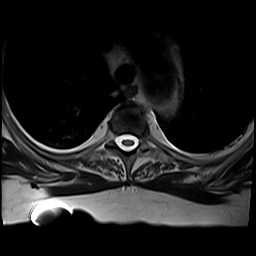
[im 45/45]
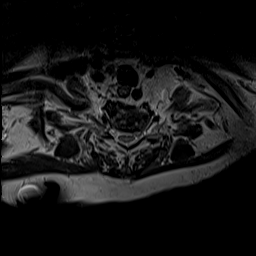

[Series 26: T1 · sagittal · 4.0mm · 0.81mm/px · 4 of 25 slices shown (3 of 4)]
[im 1/25]
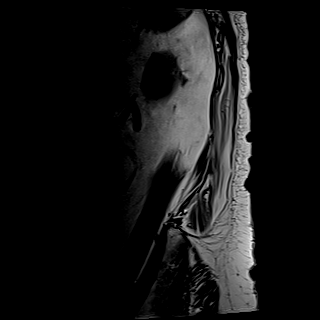
[im 9/25]
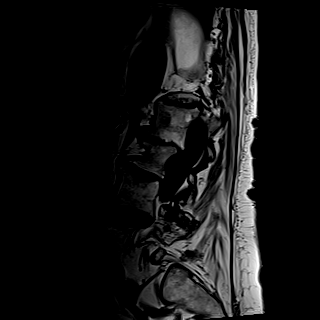
[im 17/25]
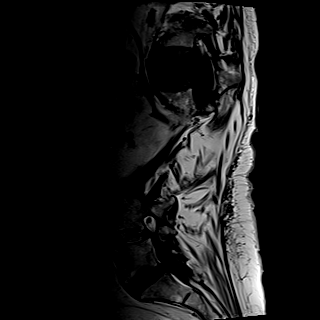
[im 25/25]
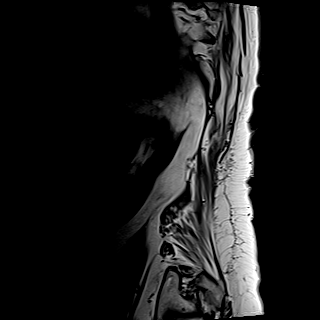

[Series 27: T2 · sagittal · 4.0mm · 0.81mm/px · 4 of 25 slices shown (3 of 4)]
[im 1/25]
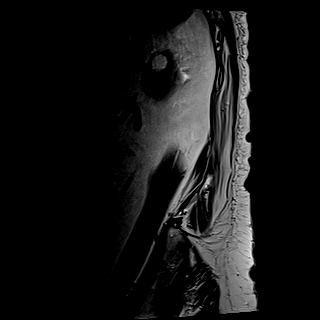
[im 9/25]
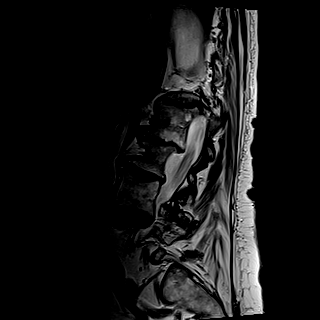
[im 17/25]
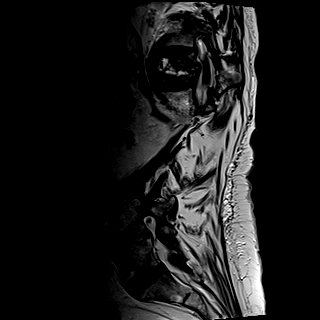
[im 25/25]
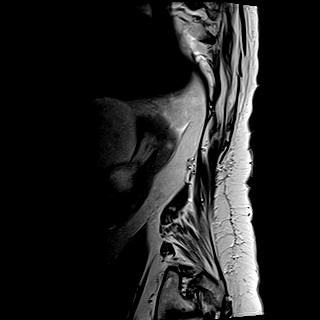

[Series 29: T2 · axial · 4.0mm · 0.62mm/px · z∈[-380,-205]mm · 6 of 38 slices shown (4 of 4)]
[im 1/38]
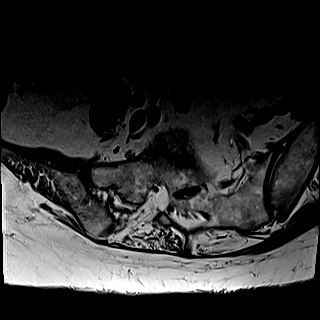
[im 8/38]
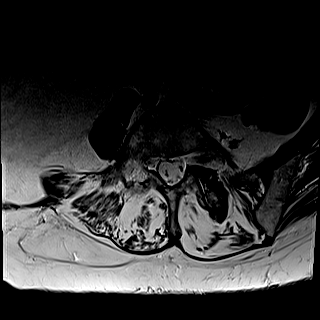
[im 15/38]
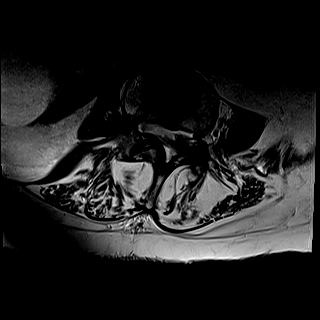
[im 23/38]
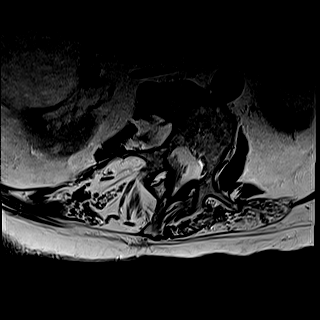
[im 30/38]
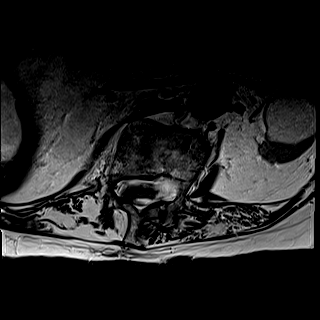
[im 38/38]
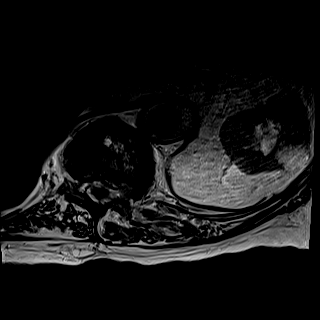

[Series 30: T1 · axial · 4.0mm · 0.39mm/px · z∈[-380,-205]mm · 6 of 38 slices shown (4 of 4)]
[im 1/38]
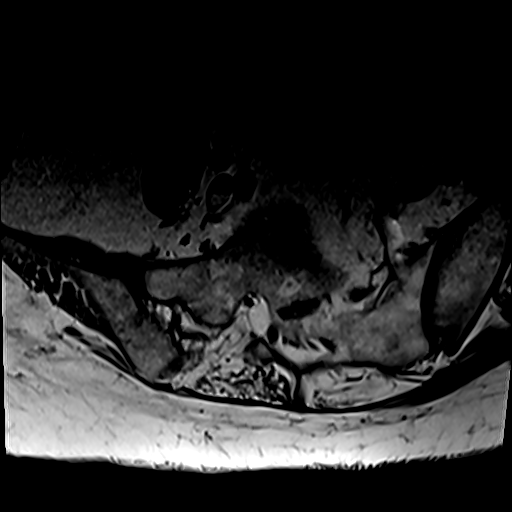
[im 8/38]
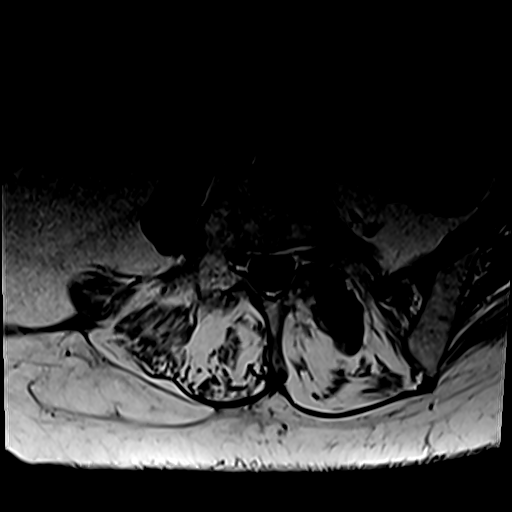
[im 15/38]
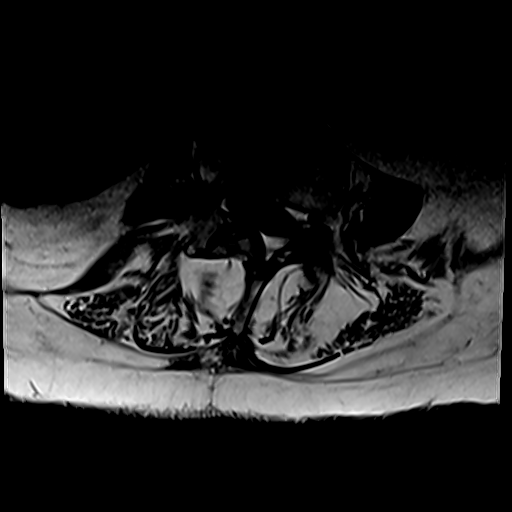
[im 23/38]
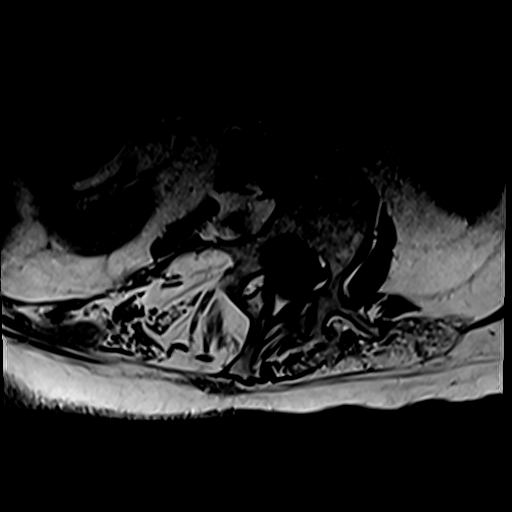
[im 30/38]
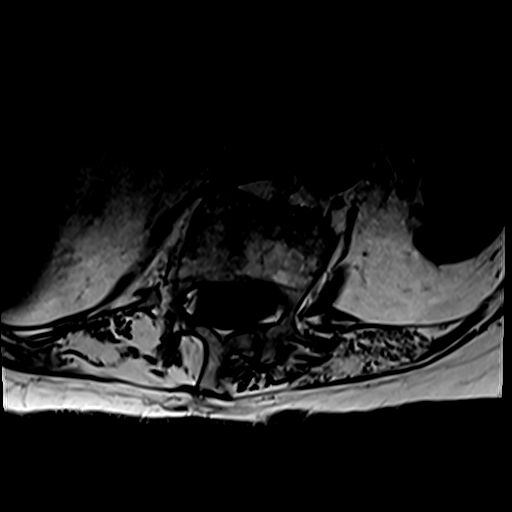
[im 38/38]
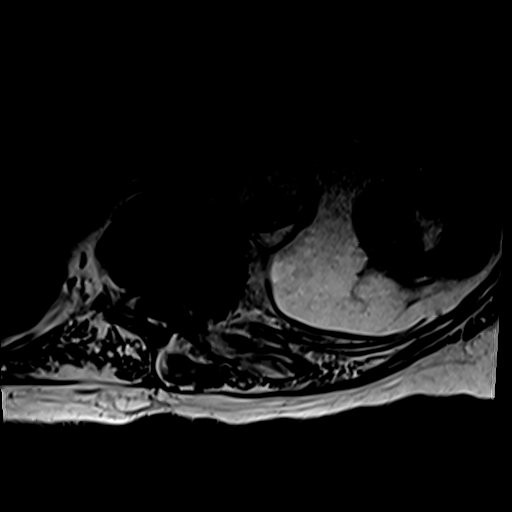

[34 of 48 positions shown; findings below may reference images not displayed]

FINDINGS: MRI THORACIC SPINE FINDINGS

Limited cervical spine imaging: Previous cervical ACDF, appears to
be at the C4-C5 levels.

Thoracic spine segmentation: Appears to be normal aside from
hypoplastic ribs at T12, as suggested on CT Abdomen and Pelvis last
month.

Alignment: Normal thoracic vertebral height and alignment above the
T11 level, see below.

Vertebrae: Thoracic vertebrae T1 through T8 are intact with normal
marrow signal.

T9 vertebral body appears intact but there is an acute to subacute
fracture of the posterior left 9th rib with marrow edema (series 17,
image 17 and series 21, image 30).

T10 vertebra appears intact but there is a fracture of the posterior
left 10th rib (same image on series 17 and series 21, image 33).

Progress T11 compression fracture since last month with comminution,
confluent marrow edema, vertebral loss of height now up to 40%
(previously less than 20%). New retropulsion of bone (series 17,
image 10) with mild spinal stenosis (series 21, image 37). Mild
associated spinal cord mass effect (series 20, image 37 and series
29, image 2). No spinal cord edema or myelomalacia suspected.
Moderate to severe left and moderate right T11 foraminal stenosis
also is increased.

Superimposed left posterior Veera through fracture suspected.

The T12 vertebra appears intact with superimposed benign vertebral
body hemangioma.

Cord: Capacious thoracic spinal canal above T11. See T11 spinal
stenosis and cord mass effect details above. No definite cord signal
abnormality. Conus medullaris is at T12-L1.

Paraspinal and other soft tissues: Left paraspinal soft tissue edema
from the 9th rib through T12 vertebral level. No drainable
paraspinal fluid collection.

Trace bilateral layering pleural effusions. Small round right lung
base fat density nodule appears stable from last month.

Disc levels:

No age advanced degenerative changes superimposed on the acute
osseous abnormalities detailed above.

MRI LUMBAR SPINE FINDINGS

Segmentation:  Normal.

Alignment: Moderate levoconvex lumbar scoliosis appears stable from
last month. Stable lumbar vertebral height and alignment.

Vertebrae: No marrow edema or evidence of acute osseous abnormality
in the lumbar spine. Intact visible sacrum and SI joints.

Conus medullaris and cauda equina: Conus extends to the T12-L1
level. Conus and cauda equina appear normal.

Paraspinal and other soft tissues: Left posterolateral lower
thoracic paraspinal soft tissue edema but no acute lumbar paraspinal
soft tissue finding. Chronic lumbar paraspinal muscle atrophy.
Visible abdominal viscera appear stable since last month.

Disc levels:

Advanced lumbar spine degeneration in the setting of levoconvex
scoliosis. Up to mild associated lumbar spinal stenosis.
IMPRESSION: MR THORACIC SPINE IMPRESSION

1. Progressed T11 compression fracture since last month.
Increased comminution. Loss of height increased from 20% to 40%. And
new retropulsion of bone resulting in mild spinal stenosis with mild
cord mass effect but no spinal cord signal abnormality.
2. Associated acute to subacute posterior left rib fractures 9
through 11.
3. Left paraspinal soft tissue edema from T9 to T12.
4. Trace layering pleural effusions.

MR LUMBAR SPINE IMPRESSION

1. No acute osseous abnormality in the lumbar spine.
2. Advanced lumbar spine degeneration the setting of moderate
levoconvex lumbar scoliosis.

## 2021-03-10 DIAGNOSIS — Z7901 Long term (current) use of anticoagulants: Secondary | ICD-10-CM | POA: Diagnosis not present

## 2021-03-22 DIAGNOSIS — I10 Essential (primary) hypertension: Secondary | ICD-10-CM | POA: Diagnosis not present

## 2021-03-22 DIAGNOSIS — R54 Age-related physical debility: Secondary | ICD-10-CM | POA: Diagnosis not present

## 2021-03-22 DIAGNOSIS — Z7409 Other reduced mobility: Secondary | ICD-10-CM | POA: Diagnosis not present

## 2021-03-22 DIAGNOSIS — H01009 Unspecified blepharitis unspecified eye, unspecified eyelid: Secondary | ICD-10-CM | POA: Diagnosis not present

## 2021-03-22 DIAGNOSIS — H6092 Unspecified otitis externa, left ear: Secondary | ICD-10-CM | POA: Diagnosis not present

## 2021-04-05 DIAGNOSIS — S81811A Laceration without foreign body, right lower leg, initial encounter: Secondary | ICD-10-CM | POA: Diagnosis not present

## 2021-04-05 DIAGNOSIS — Z6825 Body mass index (BMI) 25.0-25.9, adult: Secondary | ICD-10-CM | POA: Diagnosis not present

## 2021-04-07 DIAGNOSIS — Z7901 Long term (current) use of anticoagulants: Secondary | ICD-10-CM | POA: Diagnosis not present

## 2021-05-05 DIAGNOSIS — Z7901 Long term (current) use of anticoagulants: Secondary | ICD-10-CM | POA: Diagnosis not present

## 2021-05-25 DIAGNOSIS — I480 Paroxysmal atrial fibrillation: Secondary | ICD-10-CM | POA: Diagnosis not present

## 2021-05-25 DIAGNOSIS — I10 Essential (primary) hypertension: Secondary | ICD-10-CM | POA: Diagnosis not present

## 2021-05-25 DIAGNOSIS — R600 Localized edema: Secondary | ICD-10-CM | POA: Diagnosis not present

## 2021-05-31 DIAGNOSIS — M810 Age-related osteoporosis without current pathological fracture: Secondary | ICD-10-CM | POA: Diagnosis not present

## 2021-05-31 DIAGNOSIS — B029 Zoster without complications: Secondary | ICD-10-CM | POA: Diagnosis not present

## 2021-05-31 DIAGNOSIS — I10 Essential (primary) hypertension: Secondary | ICD-10-CM | POA: Diagnosis not present

## 2021-06-02 DIAGNOSIS — H401121 Primary open-angle glaucoma, left eye, mild stage: Secondary | ICD-10-CM | POA: Diagnosis not present

## 2021-06-02 DIAGNOSIS — H524 Presbyopia: Secondary | ICD-10-CM | POA: Diagnosis not present

## 2021-06-02 DIAGNOSIS — H401113 Primary open-angle glaucoma, right eye, severe stage: Secondary | ICD-10-CM | POA: Diagnosis not present

## 2021-06-02 DIAGNOSIS — H04123 Dry eye syndrome of bilateral lacrimal glands: Secondary | ICD-10-CM | POA: Diagnosis not present

## 2021-06-02 DIAGNOSIS — H26491 Other secondary cataract, right eye: Secondary | ICD-10-CM | POA: Diagnosis not present

## 2021-06-06 DIAGNOSIS — Z7901 Long term (current) use of anticoagulants: Secondary | ICD-10-CM | POA: Diagnosis not present

## 2021-06-06 DIAGNOSIS — M818 Other osteoporosis without current pathological fracture: Secondary | ICD-10-CM | POA: Diagnosis not present

## 2021-06-28 DIAGNOSIS — Z7901 Long term (current) use of anticoagulants: Secondary | ICD-10-CM | POA: Diagnosis not present

## 2021-06-30 DIAGNOSIS — M170 Bilateral primary osteoarthritis of knee: Secondary | ICD-10-CM | POA: Diagnosis not present

## 2021-06-30 DIAGNOSIS — I10 Essential (primary) hypertension: Secondary | ICD-10-CM | POA: Diagnosis not present

## 2021-06-30 DIAGNOSIS — M818 Other osteoporosis without current pathological fracture: Secondary | ICD-10-CM | POA: Diagnosis not present

## 2021-06-30 DIAGNOSIS — I480 Paroxysmal atrial fibrillation: Secondary | ICD-10-CM | POA: Diagnosis not present

## 2021-07-21 DIAGNOSIS — R42 Dizziness and giddiness: Secondary | ICD-10-CM | POA: Diagnosis not present

## 2021-07-21 DIAGNOSIS — R110 Nausea: Secondary | ICD-10-CM | POA: Diagnosis not present

## 2021-07-26 DIAGNOSIS — Z7901 Long term (current) use of anticoagulants: Secondary | ICD-10-CM | POA: Diagnosis not present

## 2021-08-02 DIAGNOSIS — Z7901 Long term (current) use of anticoagulants: Secondary | ICD-10-CM | POA: Diagnosis not present

## 2021-08-16 DIAGNOSIS — Z7901 Long term (current) use of anticoagulants: Secondary | ICD-10-CM | POA: Diagnosis not present

## 2021-08-23 DIAGNOSIS — K30 Functional dyspepsia: Secondary | ICD-10-CM | POA: Diagnosis not present

## 2021-08-23 DIAGNOSIS — Z7901 Long term (current) use of anticoagulants: Secondary | ICD-10-CM | POA: Diagnosis not present

## 2021-08-23 DIAGNOSIS — M810 Age-related osteoporosis without current pathological fracture: Secondary | ICD-10-CM | POA: Diagnosis not present

## 2021-08-23 DIAGNOSIS — H4089 Other specified glaucoma: Secondary | ICD-10-CM | POA: Diagnosis not present

## 2021-08-23 DIAGNOSIS — M170 Bilateral primary osteoarthritis of knee: Secondary | ICD-10-CM | POA: Diagnosis not present

## 2021-08-23 DIAGNOSIS — I10 Essential (primary) hypertension: Secondary | ICD-10-CM | POA: Diagnosis not present

## 2021-08-23 DIAGNOSIS — R300 Dysuria: Secondary | ICD-10-CM | POA: Diagnosis not present

## 2021-08-23 DIAGNOSIS — N3941 Urge incontinence: Secondary | ICD-10-CM | POA: Diagnosis not present

## 2021-08-23 DIAGNOSIS — G2581 Restless legs syndrome: Secondary | ICD-10-CM | POA: Diagnosis not present

## 2021-08-23 DIAGNOSIS — N1831 Chronic kidney disease, stage 3a: Secondary | ICD-10-CM | POA: Diagnosis not present

## 2021-08-25 DIAGNOSIS — I10 Essential (primary) hypertension: Secondary | ICD-10-CM | POA: Diagnosis not present

## 2021-08-25 DIAGNOSIS — M818 Other osteoporosis without current pathological fracture: Secondary | ICD-10-CM | POA: Diagnosis not present

## 2021-08-25 DIAGNOSIS — I480 Paroxysmal atrial fibrillation: Secondary | ICD-10-CM | POA: Diagnosis not present

## 2021-08-25 DIAGNOSIS — M170 Bilateral primary osteoarthritis of knee: Secondary | ICD-10-CM | POA: Diagnosis not present

## 2021-08-26 DIAGNOSIS — R54 Age-related physical debility: Secondary | ICD-10-CM | POA: Diagnosis not present

## 2021-08-26 DIAGNOSIS — I480 Paroxysmal atrial fibrillation: Secondary | ICD-10-CM | POA: Diagnosis not present

## 2021-08-26 DIAGNOSIS — Z7901 Long term (current) use of anticoagulants: Secondary | ICD-10-CM | POA: Diagnosis not present

## 2021-08-26 DIAGNOSIS — R040 Epistaxis: Secondary | ICD-10-CM | POA: Diagnosis not present

## 2021-09-13 DIAGNOSIS — Z7901 Long term (current) use of anticoagulants: Secondary | ICD-10-CM | POA: Diagnosis not present

## 2021-10-11 DIAGNOSIS — Z7901 Long term (current) use of anticoagulants: Secondary | ICD-10-CM | POA: Diagnosis not present

## 2021-11-09 DIAGNOSIS — Z7901 Long term (current) use of anticoagulants: Secondary | ICD-10-CM | POA: Diagnosis not present

## 2021-12-07 DIAGNOSIS — Z7901 Long term (current) use of anticoagulants: Secondary | ICD-10-CM | POA: Diagnosis not present

## 2021-12-08 DIAGNOSIS — H401113 Primary open-angle glaucoma, right eye, severe stage: Secondary | ICD-10-CM | POA: Diagnosis not present

## 2021-12-08 DIAGNOSIS — Z6824 Body mass index (BMI) 24.0-24.9, adult: Secondary | ICD-10-CM | POA: Diagnosis not present

## 2021-12-08 DIAGNOSIS — R600 Localized edema: Secondary | ICD-10-CM | POA: Diagnosis not present

## 2021-12-08 DIAGNOSIS — H401121 Primary open-angle glaucoma, left eye, mild stage: Secondary | ICD-10-CM | POA: Diagnosis not present

## 2021-12-08 DIAGNOSIS — N939 Abnormal uterine and vaginal bleeding, unspecified: Secondary | ICD-10-CM | POA: Diagnosis not present

## 2021-12-08 DIAGNOSIS — N952 Postmenopausal atrophic vaginitis: Secondary | ICD-10-CM | POA: Diagnosis not present

## 2021-12-22 DIAGNOSIS — Z0100 Encounter for examination of eyes and vision without abnormal findings: Secondary | ICD-10-CM | POA: Diagnosis not present

## 2022-01-04 DIAGNOSIS — Z7901 Long term (current) use of anticoagulants: Secondary | ICD-10-CM | POA: Diagnosis not present

## 2022-01-27 DIAGNOSIS — N3941 Urge incontinence: Secondary | ICD-10-CM | POA: Diagnosis not present

## 2022-01-27 DIAGNOSIS — G629 Polyneuropathy, unspecified: Secondary | ICD-10-CM | POA: Diagnosis not present

## 2022-01-27 DIAGNOSIS — N952 Postmenopausal atrophic vaginitis: Secondary | ICD-10-CM | POA: Diagnosis not present

## 2022-02-03 DIAGNOSIS — Z7901 Long term (current) use of anticoagulants: Secondary | ICD-10-CM | POA: Diagnosis not present

## 2022-02-03 DIAGNOSIS — G629 Polyneuropathy, unspecified: Secondary | ICD-10-CM | POA: Diagnosis not present

## 2022-02-22 DIAGNOSIS — I10 Essential (primary) hypertension: Secondary | ICD-10-CM | POA: Diagnosis not present

## 2022-02-22 DIAGNOSIS — M818 Other osteoporosis without current pathological fracture: Secondary | ICD-10-CM | POA: Diagnosis not present

## 2022-02-22 DIAGNOSIS — M170 Bilateral primary osteoarthritis of knee: Secondary | ICD-10-CM | POA: Diagnosis not present

## 2022-02-28 DIAGNOSIS — Z7901 Long term (current) use of anticoagulants: Secondary | ICD-10-CM | POA: Diagnosis not present

## 2022-03-15 DIAGNOSIS — R351 Nocturia: Secondary | ICD-10-CM | POA: Diagnosis not present

## 2022-03-15 DIAGNOSIS — N3944 Nocturnal enuresis: Secondary | ICD-10-CM | POA: Diagnosis not present

## 2022-03-15 DIAGNOSIS — R3915 Urgency of urination: Secondary | ICD-10-CM | POA: Diagnosis not present

## 2022-04-03 DIAGNOSIS — Z7901 Long term (current) use of anticoagulants: Secondary | ICD-10-CM | POA: Diagnosis not present

## 2022-04-04 DIAGNOSIS — I10 Essential (primary) hypertension: Secondary | ICD-10-CM | POA: Diagnosis not present

## 2022-04-04 DIAGNOSIS — M170 Bilateral primary osteoarthritis of knee: Secondary | ICD-10-CM | POA: Diagnosis not present

## 2022-04-04 DIAGNOSIS — M810 Age-related osteoporosis without current pathological fracture: Secondary | ICD-10-CM | POA: Diagnosis not present

## 2022-04-04 DIAGNOSIS — H4089 Other specified glaucoma: Secondary | ICD-10-CM | POA: Diagnosis not present

## 2022-04-04 DIAGNOSIS — I480 Paroxysmal atrial fibrillation: Secondary | ICD-10-CM | POA: Diagnosis not present

## 2022-04-28 DIAGNOSIS — R351 Nocturia: Secondary | ICD-10-CM | POA: Diagnosis not present

## 2022-04-28 DIAGNOSIS — N3944 Nocturnal enuresis: Secondary | ICD-10-CM | POA: Diagnosis not present

## 2022-05-09 DIAGNOSIS — I10 Essential (primary) hypertension: Secondary | ICD-10-CM | POA: Diagnosis not present

## 2022-05-09 DIAGNOSIS — I480 Paroxysmal atrial fibrillation: Secondary | ICD-10-CM | POA: Diagnosis not present

## 2022-05-09 DIAGNOSIS — R609 Edema, unspecified: Secondary | ICD-10-CM | POA: Diagnosis not present

## 2022-05-09 DIAGNOSIS — Z7901 Long term (current) use of anticoagulants: Secondary | ICD-10-CM | POA: Diagnosis not present

## 2022-05-31 DIAGNOSIS — S8992XA Unspecified injury of left lower leg, initial encounter: Secondary | ICD-10-CM | POA: Diagnosis not present

## 2022-05-31 DIAGNOSIS — N3941 Urge incontinence: Secondary | ICD-10-CM | POA: Diagnosis not present

## 2022-05-31 DIAGNOSIS — I480 Paroxysmal atrial fibrillation: Secondary | ICD-10-CM | POA: Diagnosis not present

## 2022-05-31 DIAGNOSIS — Z6824 Body mass index (BMI) 24.0-24.9, adult: Secondary | ICD-10-CM | POA: Diagnosis not present

## 2022-05-31 DIAGNOSIS — H4089 Other specified glaucoma: Secondary | ICD-10-CM | POA: Diagnosis not present

## 2022-05-31 DIAGNOSIS — D485 Neoplasm of uncertain behavior of skin: Secondary | ICD-10-CM | POA: Diagnosis not present

## 2022-05-31 DIAGNOSIS — I10 Essential (primary) hypertension: Secondary | ICD-10-CM | POA: Diagnosis not present

## 2022-05-31 DIAGNOSIS — G2581 Restless legs syndrome: Secondary | ICD-10-CM | POA: Diagnosis not present

## 2022-05-31 DIAGNOSIS — Z7901 Long term (current) use of anticoagulants: Secondary | ICD-10-CM | POA: Diagnosis not present

## 2022-06-05 DIAGNOSIS — S81802D Unspecified open wound, left lower leg, subsequent encounter: Secondary | ICD-10-CM | POA: Diagnosis not present

## 2022-06-05 DIAGNOSIS — Z6824 Body mass index (BMI) 24.0-24.9, adult: Secondary | ICD-10-CM | POA: Diagnosis not present

## 2022-06-06 DIAGNOSIS — D485 Neoplasm of uncertain behavior of skin: Secondary | ICD-10-CM | POA: Diagnosis not present

## 2022-06-06 DIAGNOSIS — C44622 Squamous cell carcinoma of skin of right upper limb, including shoulder: Secondary | ICD-10-CM | POA: Diagnosis not present

## 2022-06-06 DIAGNOSIS — L570 Actinic keratosis: Secondary | ICD-10-CM | POA: Diagnosis not present

## 2022-06-06 DIAGNOSIS — L218 Other seborrheic dermatitis: Secondary | ICD-10-CM | POA: Diagnosis not present

## 2022-06-06 DIAGNOSIS — Z85828 Personal history of other malignant neoplasm of skin: Secondary | ICD-10-CM | POA: Diagnosis not present

## 2022-06-09 DIAGNOSIS — Z6824 Body mass index (BMI) 24.0-24.9, adult: Secondary | ICD-10-CM | POA: Diagnosis not present

## 2022-06-09 DIAGNOSIS — S81802S Unspecified open wound, left lower leg, sequela: Secondary | ICD-10-CM | POA: Diagnosis not present

## 2022-06-09 DIAGNOSIS — R600 Localized edema: Secondary | ICD-10-CM | POA: Diagnosis not present

## 2022-06-09 DIAGNOSIS — S81802D Unspecified open wound, left lower leg, subsequent encounter: Secondary | ICD-10-CM | POA: Diagnosis not present

## 2022-06-13 DIAGNOSIS — H35032 Hypertensive retinopathy, left eye: Secondary | ICD-10-CM | POA: Diagnosis not present

## 2022-06-13 DIAGNOSIS — H401121 Primary open-angle glaucoma, left eye, mild stage: Secondary | ICD-10-CM | POA: Diagnosis not present

## 2022-06-13 DIAGNOSIS — H04123 Dry eye syndrome of bilateral lacrimal glands: Secondary | ICD-10-CM | POA: Diagnosis not present

## 2022-06-13 DIAGNOSIS — H401113 Primary open-angle glaucoma, right eye, severe stage: Secondary | ICD-10-CM | POA: Diagnosis not present

## 2022-06-14 DIAGNOSIS — I350 Nonrheumatic aortic (valve) stenosis: Secondary | ICD-10-CM | POA: Diagnosis not present

## 2022-06-14 DIAGNOSIS — R600 Localized edema: Secondary | ICD-10-CM | POA: Diagnosis not present

## 2022-06-14 DIAGNOSIS — M170 Bilateral primary osteoarthritis of knee: Secondary | ICD-10-CM | POA: Diagnosis not present

## 2022-06-14 DIAGNOSIS — S81802D Unspecified open wound, left lower leg, subsequent encounter: Secondary | ICD-10-CM | POA: Diagnosis not present

## 2022-06-14 DIAGNOSIS — G2581 Restless legs syndrome: Secondary | ICD-10-CM | POA: Diagnosis not present

## 2022-06-14 DIAGNOSIS — I480 Paroxysmal atrial fibrillation: Secondary | ICD-10-CM | POA: Diagnosis not present

## 2022-06-14 DIAGNOSIS — N3946 Mixed incontinence: Secondary | ICD-10-CM | POA: Diagnosis not present

## 2022-06-14 DIAGNOSIS — L089 Local infection of the skin and subcutaneous tissue, unspecified: Secondary | ICD-10-CM | POA: Diagnosis not present

## 2022-06-14 DIAGNOSIS — I10 Essential (primary) hypertension: Secondary | ICD-10-CM | POA: Diagnosis not present

## 2022-06-19 DIAGNOSIS — R600 Localized edema: Secondary | ICD-10-CM | POA: Diagnosis not present

## 2022-06-19 DIAGNOSIS — M170 Bilateral primary osteoarthritis of knee: Secondary | ICD-10-CM | POA: Diagnosis not present

## 2022-06-19 DIAGNOSIS — S81802D Unspecified open wound, left lower leg, subsequent encounter: Secondary | ICD-10-CM | POA: Diagnosis not present

## 2022-06-19 DIAGNOSIS — I480 Paroxysmal atrial fibrillation: Secondary | ICD-10-CM | POA: Diagnosis not present

## 2022-06-19 DIAGNOSIS — G2581 Restless legs syndrome: Secondary | ICD-10-CM | POA: Diagnosis not present

## 2022-06-19 DIAGNOSIS — I350 Nonrheumatic aortic (valve) stenosis: Secondary | ICD-10-CM | POA: Diagnosis not present

## 2022-06-19 DIAGNOSIS — I10 Essential (primary) hypertension: Secondary | ICD-10-CM | POA: Diagnosis not present

## 2022-06-19 DIAGNOSIS — N3946 Mixed incontinence: Secondary | ICD-10-CM | POA: Diagnosis not present

## 2022-06-19 DIAGNOSIS — L089 Local infection of the skin and subcutaneous tissue, unspecified: Secondary | ICD-10-CM | POA: Diagnosis not present

## 2022-06-23 DIAGNOSIS — S81802D Unspecified open wound, left lower leg, subsequent encounter: Secondary | ICD-10-CM | POA: Diagnosis not present

## 2022-06-23 DIAGNOSIS — I350 Nonrheumatic aortic (valve) stenosis: Secondary | ICD-10-CM | POA: Diagnosis not present

## 2022-06-23 DIAGNOSIS — N3946 Mixed incontinence: Secondary | ICD-10-CM | POA: Diagnosis not present

## 2022-06-23 DIAGNOSIS — G2581 Restless legs syndrome: Secondary | ICD-10-CM | POA: Diagnosis not present

## 2022-06-23 DIAGNOSIS — L089 Local infection of the skin and subcutaneous tissue, unspecified: Secondary | ICD-10-CM | POA: Diagnosis not present

## 2022-06-23 DIAGNOSIS — I480 Paroxysmal atrial fibrillation: Secondary | ICD-10-CM | POA: Diagnosis not present

## 2022-06-23 DIAGNOSIS — M170 Bilateral primary osteoarthritis of knee: Secondary | ICD-10-CM | POA: Diagnosis not present

## 2022-06-23 DIAGNOSIS — R600 Localized edema: Secondary | ICD-10-CM | POA: Diagnosis not present

## 2022-06-23 DIAGNOSIS — I10 Essential (primary) hypertension: Secondary | ICD-10-CM | POA: Diagnosis not present

## 2022-06-26 DIAGNOSIS — G2581 Restless legs syndrome: Secondary | ICD-10-CM | POA: Diagnosis not present

## 2022-06-26 DIAGNOSIS — L089 Local infection of the skin and subcutaneous tissue, unspecified: Secondary | ICD-10-CM | POA: Diagnosis not present

## 2022-06-26 DIAGNOSIS — N3946 Mixed incontinence: Secondary | ICD-10-CM | POA: Diagnosis not present

## 2022-06-26 DIAGNOSIS — R600 Localized edema: Secondary | ICD-10-CM | POA: Diagnosis not present

## 2022-06-26 DIAGNOSIS — I480 Paroxysmal atrial fibrillation: Secondary | ICD-10-CM | POA: Diagnosis not present

## 2022-06-26 DIAGNOSIS — S81802D Unspecified open wound, left lower leg, subsequent encounter: Secondary | ICD-10-CM | POA: Diagnosis not present

## 2022-06-26 DIAGNOSIS — I350 Nonrheumatic aortic (valve) stenosis: Secondary | ICD-10-CM | POA: Diagnosis not present

## 2022-06-26 DIAGNOSIS — I10 Essential (primary) hypertension: Secondary | ICD-10-CM | POA: Diagnosis not present

## 2022-06-26 DIAGNOSIS — M170 Bilateral primary osteoarthritis of knee: Secondary | ICD-10-CM | POA: Diagnosis not present

## 2022-06-28 DIAGNOSIS — I10 Essential (primary) hypertension: Secondary | ICD-10-CM | POA: Diagnosis not present

## 2022-06-28 DIAGNOSIS — Z7901 Long term (current) use of anticoagulants: Secondary | ICD-10-CM | POA: Diagnosis not present

## 2022-06-29 DIAGNOSIS — I480 Paroxysmal atrial fibrillation: Secondary | ICD-10-CM | POA: Diagnosis not present

## 2022-06-29 DIAGNOSIS — N3946 Mixed incontinence: Secondary | ICD-10-CM | POA: Diagnosis not present

## 2022-06-29 DIAGNOSIS — L089 Local infection of the skin and subcutaneous tissue, unspecified: Secondary | ICD-10-CM | POA: Diagnosis not present

## 2022-06-29 DIAGNOSIS — G2581 Restless legs syndrome: Secondary | ICD-10-CM | POA: Diagnosis not present

## 2022-06-29 DIAGNOSIS — I10 Essential (primary) hypertension: Secondary | ICD-10-CM | POA: Diagnosis not present

## 2022-06-29 DIAGNOSIS — I350 Nonrheumatic aortic (valve) stenosis: Secondary | ICD-10-CM | POA: Diagnosis not present

## 2022-06-29 DIAGNOSIS — R600 Localized edema: Secondary | ICD-10-CM | POA: Diagnosis not present

## 2022-06-29 DIAGNOSIS — M170 Bilateral primary osteoarthritis of knee: Secondary | ICD-10-CM | POA: Diagnosis not present

## 2022-06-29 DIAGNOSIS — S81802D Unspecified open wound, left lower leg, subsequent encounter: Secondary | ICD-10-CM | POA: Diagnosis not present

## 2022-07-03 DIAGNOSIS — I350 Nonrheumatic aortic (valve) stenosis: Secondary | ICD-10-CM | POA: Diagnosis not present

## 2022-07-03 DIAGNOSIS — L089 Local infection of the skin and subcutaneous tissue, unspecified: Secondary | ICD-10-CM | POA: Diagnosis not present

## 2022-07-03 DIAGNOSIS — R600 Localized edema: Secondary | ICD-10-CM | POA: Diagnosis not present

## 2022-07-03 DIAGNOSIS — S81802D Unspecified open wound, left lower leg, subsequent encounter: Secondary | ICD-10-CM | POA: Diagnosis not present

## 2022-07-03 DIAGNOSIS — N3946 Mixed incontinence: Secondary | ICD-10-CM | POA: Diagnosis not present

## 2022-07-03 DIAGNOSIS — M170 Bilateral primary osteoarthritis of knee: Secondary | ICD-10-CM | POA: Diagnosis not present

## 2022-07-03 DIAGNOSIS — G2581 Restless legs syndrome: Secondary | ICD-10-CM | POA: Diagnosis not present

## 2022-07-03 DIAGNOSIS — I480 Paroxysmal atrial fibrillation: Secondary | ICD-10-CM | POA: Diagnosis not present

## 2022-07-03 DIAGNOSIS — I10 Essential (primary) hypertension: Secondary | ICD-10-CM | POA: Diagnosis not present

## 2022-07-06 DIAGNOSIS — S81802D Unspecified open wound, left lower leg, subsequent encounter: Secondary | ICD-10-CM | POA: Diagnosis not present

## 2022-07-06 DIAGNOSIS — I10 Essential (primary) hypertension: Secondary | ICD-10-CM | POA: Diagnosis not present

## 2022-07-06 DIAGNOSIS — L089 Local infection of the skin and subcutaneous tissue, unspecified: Secondary | ICD-10-CM | POA: Diagnosis not present

## 2022-07-06 DIAGNOSIS — M170 Bilateral primary osteoarthritis of knee: Secondary | ICD-10-CM | POA: Diagnosis not present

## 2022-07-06 DIAGNOSIS — N3946 Mixed incontinence: Secondary | ICD-10-CM | POA: Diagnosis not present

## 2022-07-06 DIAGNOSIS — R600 Localized edema: Secondary | ICD-10-CM | POA: Diagnosis not present

## 2022-07-06 DIAGNOSIS — I350 Nonrheumatic aortic (valve) stenosis: Secondary | ICD-10-CM | POA: Diagnosis not present

## 2022-07-06 DIAGNOSIS — G2581 Restless legs syndrome: Secondary | ICD-10-CM | POA: Diagnosis not present

## 2022-07-06 DIAGNOSIS — I480 Paroxysmal atrial fibrillation: Secondary | ICD-10-CM | POA: Diagnosis not present

## 2022-07-10 DIAGNOSIS — M170 Bilateral primary osteoarthritis of knee: Secondary | ICD-10-CM | POA: Diagnosis not present

## 2022-07-10 DIAGNOSIS — G2581 Restless legs syndrome: Secondary | ICD-10-CM | POA: Diagnosis not present

## 2022-07-10 DIAGNOSIS — L089 Local infection of the skin and subcutaneous tissue, unspecified: Secondary | ICD-10-CM | POA: Diagnosis not present

## 2022-07-10 DIAGNOSIS — I350 Nonrheumatic aortic (valve) stenosis: Secondary | ICD-10-CM | POA: Diagnosis not present

## 2022-07-10 DIAGNOSIS — S81802D Unspecified open wound, left lower leg, subsequent encounter: Secondary | ICD-10-CM | POA: Diagnosis not present

## 2022-07-10 DIAGNOSIS — R600 Localized edema: Secondary | ICD-10-CM | POA: Diagnosis not present

## 2022-07-10 DIAGNOSIS — I10 Essential (primary) hypertension: Secondary | ICD-10-CM | POA: Diagnosis not present

## 2022-07-10 DIAGNOSIS — I480 Paroxysmal atrial fibrillation: Secondary | ICD-10-CM | POA: Diagnosis not present

## 2022-07-10 DIAGNOSIS — N3946 Mixed incontinence: Secondary | ICD-10-CM | POA: Diagnosis not present

## 2022-07-13 ENCOUNTER — Encounter (HOSPITAL_COMMUNITY): Payer: Self-pay

## 2022-07-13 ENCOUNTER — Other Ambulatory Visit: Payer: Self-pay

## 2022-07-13 ENCOUNTER — Inpatient Hospital Stay (HOSPITAL_COMMUNITY)
Admission: EM | Admit: 2022-07-13 | Discharge: 2022-07-20 | DRG: 291 | Disposition: A | Discharge status: Home Health | Payer: Medicare HMO | Attending: Internal Medicine | Admitting: Internal Medicine

## 2022-07-13 DIAGNOSIS — Z885 Allergy status to narcotic agent status: Secondary | ICD-10-CM

## 2022-07-13 DIAGNOSIS — N179 Acute kidney failure, unspecified: Secondary | ICD-10-CM | POA: Diagnosis present

## 2022-07-13 DIAGNOSIS — Z66 Do not resuscitate: Secondary | ICD-10-CM | POA: Diagnosis not present

## 2022-07-13 DIAGNOSIS — Z888 Allergy status to other drugs, medicaments and biological substances status: Secondary | ICD-10-CM | POA: Diagnosis not present

## 2022-07-13 DIAGNOSIS — I509 Heart failure, unspecified: Principal | ICD-10-CM

## 2022-07-13 DIAGNOSIS — I495 Sick sinus syndrome: Secondary | ICD-10-CM | POA: Diagnosis not present

## 2022-07-13 DIAGNOSIS — I5043 Acute on chronic combined systolic (congestive) and diastolic (congestive) heart failure: Secondary | ICD-10-CM | POA: Diagnosis present

## 2022-07-13 DIAGNOSIS — I110 Hypertensive heart disease with heart failure: Secondary | ICD-10-CM | POA: Diagnosis not present

## 2022-07-13 DIAGNOSIS — Z8673 Personal history of transient ischemic attack (TIA), and cerebral infarction without residual deficits: Secondary | ICD-10-CM

## 2022-07-13 DIAGNOSIS — D6869 Other thrombophilia: Secondary | ICD-10-CM | POA: Diagnosis not present

## 2022-07-13 DIAGNOSIS — E873 Alkalosis: Secondary | ICD-10-CM | POA: Diagnosis not present

## 2022-07-13 DIAGNOSIS — I872 Venous insufficiency (chronic) (peripheral): Secondary | ICD-10-CM | POA: Diagnosis present

## 2022-07-13 DIAGNOSIS — Z6825 Body mass index (BMI) 25.0-25.9, adult: Secondary | ICD-10-CM | POA: Diagnosis not present

## 2022-07-13 DIAGNOSIS — L97929 Non-pressure chronic ulcer of unspecified part of left lower leg with unspecified severity: Secondary | ICD-10-CM | POA: Diagnosis present

## 2022-07-13 DIAGNOSIS — I5031 Acute diastolic (congestive) heart failure: Secondary | ICD-10-CM | POA: Diagnosis not present

## 2022-07-13 DIAGNOSIS — I4821 Permanent atrial fibrillation: Secondary | ICD-10-CM | POA: Diagnosis not present

## 2022-07-13 DIAGNOSIS — I4811 Longstanding persistent atrial fibrillation: Secondary | ICD-10-CM

## 2022-07-13 DIAGNOSIS — S22080D Wedge compression fracture of T11-T12 vertebra, subsequent encounter for fracture with routine healing: Secondary | ICD-10-CM | POA: Diagnosis not present

## 2022-07-13 DIAGNOSIS — I272 Pulmonary hypertension, unspecified: Secondary | ICD-10-CM | POA: Diagnosis not present

## 2022-07-13 DIAGNOSIS — I4891 Unspecified atrial fibrillation: Secondary | ICD-10-CM | POA: Diagnosis present

## 2022-07-13 DIAGNOSIS — E876 Hypokalemia: Secondary | ICD-10-CM | POA: Diagnosis not present

## 2022-07-13 DIAGNOSIS — M8008XG Age-related osteoporosis with current pathological fracture, vertebra(e), subsequent encounter for fracture with delayed healing: Secondary | ICD-10-CM

## 2022-07-13 DIAGNOSIS — Z602 Problems related to living alone: Secondary | ICD-10-CM | POA: Diagnosis present

## 2022-07-13 DIAGNOSIS — Z79899 Other long term (current) drug therapy: Secondary | ICD-10-CM | POA: Diagnosis not present

## 2022-07-13 DIAGNOSIS — Z7901 Long term (current) use of anticoagulants: Secondary | ICD-10-CM | POA: Diagnosis not present

## 2022-07-13 DIAGNOSIS — I5033 Acute on chronic diastolic (congestive) heart failure: Secondary | ICD-10-CM | POA: Diagnosis present

## 2022-07-13 DIAGNOSIS — F321 Major depressive disorder, single episode, moderate: Secondary | ICD-10-CM | POA: Diagnosis present

## 2022-07-13 DIAGNOSIS — J811 Chronic pulmonary edema: Secondary | ICD-10-CM | POA: Diagnosis not present

## 2022-07-13 DIAGNOSIS — I10 Essential (primary) hypertension: Secondary | ICD-10-CM | POA: Diagnosis not present

## 2022-07-13 DIAGNOSIS — Z1152 Encounter for screening for COVID-19: Secondary | ICD-10-CM | POA: Diagnosis not present

## 2022-07-13 DIAGNOSIS — I480 Paroxysmal atrial fibrillation: Secondary | ICD-10-CM | POA: Diagnosis not present

## 2022-07-13 DIAGNOSIS — E785 Hyperlipidemia, unspecified: Secondary | ICD-10-CM | POA: Diagnosis present

## 2022-07-13 DIAGNOSIS — I5021 Acute systolic (congestive) heart failure: Secondary | ICD-10-CM | POA: Diagnosis not present

## 2022-07-13 DIAGNOSIS — R0602 Shortness of breath: Secondary | ICD-10-CM | POA: Diagnosis not present

## 2022-07-13 DIAGNOSIS — J9601 Acute respiratory failure with hypoxia: Secondary | ICD-10-CM | POA: Diagnosis not present

## 2022-07-13 DIAGNOSIS — G2581 Restless legs syndrome: Secondary | ICD-10-CM | POA: Diagnosis present

## 2022-07-13 DIAGNOSIS — I161 Hypertensive emergency: Secondary | ICD-10-CM | POA: Diagnosis present

## 2022-07-13 DIAGNOSIS — S22080A Wedge compression fracture of T11-T12 vertebra, initial encounter for closed fracture: Secondary | ICD-10-CM | POA: Diagnosis present

## 2022-07-13 DIAGNOSIS — Z90711 Acquired absence of uterus with remaining cervical stump: Secondary | ICD-10-CM | POA: Diagnosis not present

## 2022-07-13 HISTORY — DX: Heart failure, unspecified: I50.9

## 2022-07-13 LAB — CBC WITH DIFFERENTIAL/PLATELET
Abs Immature Granulocytes: 0.02 10*3/uL (ref 0.00–0.07)
Basophils Absolute: 0.1 10*3/uL (ref 0.0–0.1)
Basophils Relative: 1 %
Eosinophils Absolute: 0.2 10*3/uL (ref 0.0–0.5)
Eosinophils Relative: 3 %
HCT: 39.5 % (ref 36.0–46.0)
Hemoglobin: 12.4 g/dL (ref 12.0–15.0)
Immature Granulocytes: 0 %
Lymphocytes Relative: 13 %
Lymphs Abs: 0.8 10*3/uL (ref 0.7–4.0)
MCH: 27.6 pg (ref 26.0–34.0)
MCHC: 31.4 g/dL (ref 30.0–36.0)
MCV: 87.8 fL (ref 80.0–100.0)
Monocytes Absolute: 0.7 10*3/uL (ref 0.1–1.0)
Monocytes Relative: 11 %
Neutro Abs: 4.6 10*3/uL (ref 1.7–7.7)
Neutrophils Relative %: 72 %
Platelets: 204 10*3/uL (ref 150–400)
RBC: 4.5 MIL/uL (ref 3.87–5.11)
RDW: 17.5 % — ABNORMAL HIGH (ref 11.5–15.5)
WBC: 6.4 10*3/uL (ref 4.0–10.5)
nRBC: 0 % (ref 0.0–0.2)

## 2022-07-13 LAB — PROTIME-INR
INR: 3.7 — ABNORMAL HIGH (ref 0.8–1.2)
Prothrombin Time: 36.5 seconds — ABNORMAL HIGH (ref 11.4–15.2)

## 2022-07-13 LAB — BASIC METABOLIC PANEL
Anion gap: 8 (ref 5–15)
BUN: 19 mg/dL (ref 8–23)
CO2: 25 mmol/L (ref 22–32)
Calcium: 9.1 mg/dL (ref 8.9–10.3)
Chloride: 106 mmol/L (ref 98–111)
Creatinine, Ser: 0.91 mg/dL (ref 0.44–1.00)
GFR, Estimated: 59 mL/min — ABNORMAL LOW (ref >60)
Glucose, Bld: 101 mg/dL — ABNORMAL HIGH (ref 70–99)
Potassium: 4.3 mmol/L (ref 3.5–5.1)
Sodium: 139 mmol/L (ref 135–145)

## 2022-07-13 LAB — TROPONIN I (HIGH SENSITIVITY)
Troponin I (High Sensitivity): 19 ng/L — ABNORMAL HIGH (ref <18)
Troponin I (High Sensitivity): 21 ng/L — ABNORMAL HIGH (ref <18)

## 2022-07-13 LAB — RESP PANEL BY RT-PCR (RSV, FLU A&B, COVID)  RVPGX2
Influenza A by PCR: NEGATIVE
Influenza B by PCR: NEGATIVE
Resp Syncytial Virus by PCR: NEGATIVE
SARS Coronavirus 2 by RT PCR: NEGATIVE

## 2022-07-13 LAB — BRAIN NATRIURETIC PEPTIDE: B Natriuretic Peptide: 1998.9 pg/mL — ABNORMAL HIGH (ref 0.0–100.0)

## 2022-07-13 MED ORDER — FUROSEMIDE 10 MG/ML IJ SOLN
40.0000 mg | Freq: Once | INTRAMUSCULAR | Status: AC
  Administered 2022-07-13: 40 mg via INTRAVENOUS
  Filled 2022-07-13: qty 4

## 2022-07-13 NOTE — ED Triage Notes (Signed)
Patient sent to ED from [facility] physicians for evaluation of possible pneumonia. Patient complains of shortness of breath that started a few days ago. Patient is alert, oriented, and in no apparent distress at this time.

## 2022-07-13 NOTE — ED Provider Notes (Cosign Needed)
[facility] Provider Note   CSN: [id number] Arrival date & time: 07/13/22  [id number]     History  Chief Complaint  Patient presents with   Shortness of Breath    TAJIA REICHER is a 89 y.o. female with past medical history significant for A-fib, hypertension, hyperlipidemia who presents with concern for shortness of breath for the last 3 days.  She was seen evaluated her PCP earlier today and sent to the emergency department for evaluation of pneumonia versus fluid on lungs.  She denies any chest pain, fever, chills, cough, congestion.  Patient denies any previous history of heart failure, heart attack, reports that she has had some swelling on lower extremities, worse over the last several weeks, had spoken with PCP who had mentioned providing her with Lasix prescription but that was not done at the time.  She is anticoagulated on warfarin for her A-fib   Shortness of Breath      Home Medications Prior to Admission medications   Medication Sig Start Date End Date Taking? Authorizing Provider  acetaminophen (TYLENOL) 325 MG tablet Take 650 mg by mouth every 6 (six) hours as needed.    [provider]  bisacodyl (DULCOLAX) 10 MG suppository Place 10 mg rectally daily as needed for moderate constipation.    [provider]  bisacodyl (DULCOLAX) 5 MG EC tablet Take 1 tablet (5 mg total) by mouth daily as needed for moderate constipation. 05/13/20   Hongalgi, Lenis Dickinson, MD  dorzolamide (TRUSOPT) 2 % ophthalmic solution Place 1 drop into both eyes 2 (two) times daily.     [provider]  DULoxetine (CYMBALTA) 30 MG capsule Take 1 capsule (30 mg total) by mouth daily. 06/17/20   Fargo, Amy E, NP  Ensure (ENSURE) Take 237 mLs by mouth 2 (two) times daily between meals.    [provider]  fentaNYL (DURAGESIC) 25 MCG/HR Place 1 patch onto the skin every 3 (three) days. 06/22/20   Reed, Tiffany L, DO  latanoprost (XALATAN)  0.005 % ophthalmic solution Place 1 drop into both eyes at bedtime. 06/17/20   Fargo, Amy E, NP  losartan (COZAAR) 50 MG tablet Take 1 tablet (50 mg total) by mouth daily. 06/17/20   Fargo, Amy E, NP  methocarbamol (ROBAXIN) 500 MG tablet Take 1 tablet (500 mg total) by mouth every 8 (eight) hours as needed for muscle spasms. For muscle spasms 06/17/20   Yvonna Alanis, NP  Multiple Vitamin (MULTIVITAMIN WITH MINERALS) TABS tablet Take 1 tablet by mouth daily. 05/14/20   Hongalgi, Lenis Dickinson, MD  polyethylene glycol (MIRALAX / GLYCOLAX) 17 g packet Take 17 g by mouth daily as needed.    [provider]  psyllium (HYDROCIL/METAMUCIL) 95 % PACK Take 1 packet by mouth daily. 05/21/20   Angiulli, Lavon Paganini, PA-C  rOPINIRole (REQUIP) 0.25 MG tablet Take 1 tablet (0.25 mg total) by mouth daily as needed (restless legs around 10am - home dose). 06/17/20   Yvonna Alanis, NP  warfarin (COUMADIN) 1 MG tablet Take 1 tablet (1 mg total) by mouth every Tuesday, Thursday, and Saturday at 6 PM. On Tues, Thurs, Sat 06/17/20   Fargo, Amy E, NP  warfarin (COUMADIN) 3 MG tablet Take 0.5 tablets (1.5 mg total) by mouth every Monday,Wednesday,Friday, and Sunday at 6 PM. 06/18/20   Fargo, Amy E, NP      Allergies    Amlodipine besylate, Codeine, Meperidine, Metoprolol succinate [metoprolol], and Oxycodone  Review of Systems   Review of Systems  Respiratory:  Positive for shortness of breath.   All other systems reviewed and are negative.   Physical Exam Updated Vital Signs BP (!) 154/111   Pulse (!) 29   Temp (!) 97.3 F (36.3 C) (Oral)   Resp 16   Ht '5\' 3"'$  (1.6 m)   Wt 73 kg   SpO2 90%   BMI 28.51 kg/m  Physical Exam Vitals and nursing note reviewed.  Constitutional:      General: She is not in acute distress.    Appearance: Normal appearance.  HENT:     Head: Normocephalic and atraumatic.  Eyes:     General:        Right eye: No discharge.        Left eye: No discharge.  Cardiovascular:     Rate  and Rhythm: Normal rate and regular rhythm.     Heart sounds: No murmur heard.    No friction rub. No gallop.  Pulmonary:     Effort: Pulmonary effort is normal.     Breath sounds: Normal breath sounds.  Abdominal:     General: Bowel sounds are normal.     Palpations: Abdomen is soft.  Musculoskeletal:     Comments: Bilateral 2+ nonpitting edema, she has a wound on the right leg which she reports is from an injury several weeks ago, and appears to be healing appropriately with no evidence of surrounding cellulitis  Skin:    General: Skin is warm and dry.     Capillary Refill: Capillary refill takes less than 2 seconds.  Neurological:     Mental Status: She is alert and oriented to person, place, and time.  Psychiatric:        Mood and Affect: Mood normal.        Behavior: Behavior normal.     ED Results / Procedures / Treatments   Labs (all labs ordered are listed, but only abnormal results are displayed) Labs Reviewed  BASIC METABOLIC PANEL - Abnormal; Notable for the following components:      Result Value   Glucose, Bld 101 (*)    GFR, Estimated 59 (*)    All other components within normal limits  CBC WITH DIFFERENTIAL/PLATELET - Abnormal; Notable for the following components:   RDW 17.5 (*)    All other components within normal limits  PROTIME-INR - Abnormal; Notable for the following components:   Prothrombin Time 36.5 (*)    INR 3.7 (*)    All other components within normal limits  BRAIN NATRIURETIC PEPTIDE - Abnormal; Notable for the following components:   B Natriuretic Peptide 1,998.9 (*)    All other components within normal limits  TROPONIN I (HIGH SENSITIVITY) - Abnormal; Notable for the following components:   Troponin I (High Sensitivity) 19 (*)    All other components within normal limits  TROPONIN I (HIGH SENSITIVITY) - Abnormal; Notable for the following components:   Troponin I (High Sensitivity) 21 (*)    All other components within normal limits   RESP PANEL BY RT-PCR (RSV, FLU A&B, COVID)  [id number]    EKG EKG Interpretation  Date/Time:  Thursday July 13 2022 18:56:21 EST Ventricular Rate:  78 PR Interval:    QRS Duration: 87 QT Interval:  398 QTC Calculation: 454 R Axis:   84 Text Interpretation: Atrial fibrillation Borderline right axis deviation PVCs gone Confirmed by Isla Pence [phone]) on 07/13/2022 7:01:38 PM  Radiology No results found.  Procedures Procedures    Medications Ordered in ED Medications  furosemide (LASIX) injection 40 mg (40 mg Intravenous Given 07/13/22 1933)    ED Course/ Medical Decision Making/ A&P                             Medical Decision Making Risk Prescription drug management.   This patient is a 89 y.o. female who presents to the ED for concern of shob, this involves an extensive number of treatment options, and is a complaint that carries with it a high risk of complications and morbidity. The emergent differential diagnosis prior to evaluation includes, but is not limited to,  asthma exacerbation, COPD exacerbation, acute upper respiratory infection, acute bronchitis, chronic bronchitis, interstitial lung disease, ARDS, PE, pneumonia, atypical ACS, carbon monoxide poisoning, spontaneous pneumothorax, new CHF vs CHF exacerbation, versus other . This is not an exhaustive differential.   Past Medical History / Co-morbidities / Social History: Afib, anticoagulation, HTN, HLD   Additional history: Chart reviewed. Pertinent results include: reviewed CV procedures, no previous echo  Physical Exam: Physical exam performed. The pertinent findings include: Patient with some mild crackles at lung bases, she has had some episodes of tachypnea, respiratory rate up to 29, she has been somewhat hypertensive, especially diastolic blood pressure, max 167/124.  She had 1 pulse rate of 29 but has been with normal pulse rate throughout, I think that this was a spurious reading.  On reevaluation she  continues to maintain heart rate around 60-70.  Lab Tests: I ordered, and personally interpreted labs.  The pertinent results include: CBC unremarkable, BMP unremarkable, RVP negative for COVID, flu, RSV, her troponin is  elevated at 19, delta at 21.  Her BNP is markedly elevated at 1998.  She has never been diagnosed with heart failure before, she has no echo on file.  She does have a cardiologist secondary to her chronic A-fib.   Imaging Studies: Reviewed chest x-ray performed at her PCP prior to arrival, she had some left greater than right atelectasis versus infiltrate, given her overall clinical picture I am much more suspicious of atelectasis, some component of pleural effusion noted with central pulmonary congestion  Cardiac Monitoring:  The patient was maintained on a cardiac monitor.  My attending physician Dr. Gilford Raid viewed and interpreted the cardiac monitored which showed an underlying rhythm of: sinus tachycardia. I agree with this interpretation.   Medications: I ordered medication including lasix  for heart failure.  Patient continues not to be hypoxic, and her respiratory rate has improved, however given her new diagnosis of heart failure  Consultations Obtained: I requested consultation with the hospitalist, spoke with Irene Pap,  and discussed lab and imaging findings as well as pertinent plan - they recommend: admission   Disposition: After consideration of the diagnostic results and the patients response to treatment, I feel that patient would benefit from admission .   I discussed this case with my attending physician Dr. Gilford Raid who cosigned this note including patient's presenting symptoms, physical exam, and planned diagnostics and interventions. Attending physician stated agreement with plan or made changes to plan which were implemented.    Final Clinical Impression(s) / ED Diagnoses Final diagnoses:  Acute heart failure, unspecified heart failure type [facility])     Rx / DC Orders ED Discharge Orders     None         Anselmo Pickler, PA-C 07/13/22 2204

## 2022-07-13 NOTE — ED Notes (Signed)
Pt report received from previous nurse. Pt A&O x4, vitals stable, states she needs something to help her sleep, awaiting orders. Call bell in reach. No acute distress noted.

## 2022-07-13 NOTE — H&P (Addendum)
PCP:   Cari Caraway, MD   Chief Complaint:  Shortness of breath  HPI: This is a very pleasant 89 year old female who lives at home.  She has past medical history of hypertension, atrial fibrillation, restless leg syndrome, osteoporosis with compression fracture.  Per patient she has been short of breath for several days but became acutely worse over the past 3 days.  She states she has not been able to get much rest because of the shortness of breath.  Her feet have been swollen.  For these reason she saw her PCP today who did workup, diagnosed with A-fib and congestive heart failure.  She was recommended to go to the ER for further workup and evaluation.  In the ER patient's BNP is 1, 998.9, troponin 19=> 21.  She was not in RVR, heart rate is in the 60s. Patient is on room air.  The EDP requested admission as this is the patient's first episode of congestive heart failure despite having atrial fibrillation years.  IV Lasix x 1 given.   Review of Systems:  The patient denies anorexia, fever, weight loss,, vision loss, decreased hearing, hoarseness, chest pain, syncope,balance deficits, hemoptysis, abdominal pain, melena, hematochezia, severe indigestion/heartburn, hematuria, incontinence, genital sores, muscle weakness, suspicious skin lesions, transient blindness, difficulty walking, depression, unusual weight change, abnormal bleeding, enlarged lymph nodes, angioedema, and breast masses. Positive: Shortness of breath, lower extremity edema, increased fatigability.  Past Medical History: Past Medical History:  Diagnosis Date   Acute CHF (congestive heart failure) ([place]) 07/13/2022   Hypertension    Past Surgical History:  Procedure Laterality Date   KNEE SURGERY     LUMBAR PERCUTANEOUS PEDICLE SCREW 2 LEVEL N/A 05/04/2020   Procedure: Thoracic nine to Thoracic twelve percutaneous pedicle screws;  Surgeon: Ashok Pall, MD;  Location: [facility];  Service: Neurosurgery;  Laterality: N/A;    LUNG BIOPSY     PARTIAL HYSTERECTOMY     TONSILLECTOMY AND ADENOIDECTOMY      Medications: Prior to Admission medications   Medication Sig Start Date End Date Taking? Authorizing Provider  acetaminophen (TYLENOL) 325 MG tablet Take 650 mg by mouth every 6 (six) hours as needed.    [provider]  bisacodyl (DULCOLAX) 10 MG suppository Place 10 mg rectally daily as needed for moderate constipation.    [provider]  bisacodyl (DULCOLAX) 5 MG EC tablet Take 1 tablet (5 mg total) by mouth daily as needed for moderate constipation. 05/13/20   Hongalgi, Lenis Dickinson, MD  dorzolamide (TRUSOPT) 2 % ophthalmic solution Place 1 drop into both eyes 2 (two) times daily.     [provider]  DULoxetine (CYMBALTA) 30 MG capsule Take 1 capsule (30 mg total) by mouth daily. 06/17/20   Fargo, Amy E, NP  Ensure (ENSURE) Take 237 mLs by mouth 2 (two) times daily between meals.    [provider]  fentaNYL (DURAGESIC) 25 MCG/HR Place 1 patch onto the skin every 3 (three) days. 06/22/20   Reed, Tiffany L, DO  latanoprost (XALATAN) 0.005 % ophthalmic solution Place 1 drop into both eyes at bedtime. 06/17/20   Fargo, Amy E, NP  losartan (COZAAR) 50 MG tablet Take 1 tablet (50 mg total) by mouth daily. 06/17/20   Fargo, Amy E, NP  methocarbamol (ROBAXIN) 500 MG tablet Take 1 tablet (500 mg total) by mouth every 8 (eight) hours as needed for muscle spasms. For muscle spasms 06/17/20   Yvonna Alanis, NP  Multiple Vitamin (MULTIVITAMIN WITH MINERALS) TABS  tablet Take 1 tablet by mouth daily. 05/14/20   Hongalgi, Lenis Dickinson, MD  polyethylene glycol (MIRALAX / GLYCOLAX) 17 g packet Take 17 g by mouth daily as needed.    [provider]  psyllium (HYDROCIL/METAMUCIL) 95 % PACK Take 1 packet by mouth daily. 05/21/20   Angiulli, Lavon Paganini, PA-C  rOPINIRole (REQUIP) 0.25 MG tablet Take 1 tablet (0.25 mg total) by mouth daily as needed (restless legs around 10am - home dose). 06/17/20   Yvonna Alanis,  NP  warfarin (COUMADIN) 1 MG tablet Take 1 tablet (1 mg total) by mouth every Tuesday, Thursday, and Saturday at 6 PM. On Tues, Thurs, Sat 06/17/20   Fargo, Amy E, NP  warfarin (COUMADIN) 3 MG tablet Take 0.5 tablets (1.5 mg total) by mouth every Monday,Wednesday,Friday, and Sunday at 6 PM. 06/18/20   Yvonna Alanis, NP    Allergies:   Allergies  Allergen Reactions   Amlodipine Besylate     Other reaction(s): high dose make feet swell   Codeine Nausea And Vomiting   Meperidine Nausea Only   Metoprolol Succinate [Metoprolol]     Other reaction(s): effects her breathing   Oxycodone Nausea And Vomiting    Social History:  reports that she has never smoked. She has never used smokeless tobacco. No history on file for alcohol use and drug use.  Family History: Family History  Problem Relation Age of Onset   Other Other        NO HEALTH ISSUES    Physical Exam: Vitals:   07/13/22 1930 07/13/22 2000 07/13/22 2041 07/13/22 2100  BP: (!) 167/124 (!) 174/117  (!) 154/111  Pulse: 72 70  (!) 29  Resp: (!) 26 (!) 29  16  Temp:      TempSrc:      SpO2: 94% 94%  90%  Weight:   73 kg   Height:   '5\' 3"'$  (1.6 m)     General:  Alert and oriented times three, well developed and nourished, no acute distress Eyes: PERRLA, pink conjunctiva, no scleral icterus ENT: Moist oral mucosa, neck supple, no thyromegaly, mild JVD Lungs: clear to ascultation, no wheeze, no crackles, no use of accessory muscles Cardiovascular: irregular rate and irregular rhythm, no regurgitation, no gallops, no murmurs. No carotid bruits, no JVD Abdomen: soft, positive BS, non-tender, non-distended, no organomegaly, not an acute abdomen GU: not examined Neuro: CN II - XII grossly intact, sensation intact Musculoskeletal: strength 5/5 all extremities, no clubbing, cyanosis. LLE ~2+ pitting, stasis ulcer left lower extremity above ankle Skin: Eczema, no subcutaneous crepitation, no decubitus Psych: appropriate  patient  Labs on Admission:  Recent Labs    07/13/22 1845  NA 139  K 4.3  CL 106  CO2 25  GLUCOSE 101*  BUN 19  CREATININE 0.91  CALCIUM 9.1    Recent Labs    07/13/22 1845  WBC 6.4  NEUTROABS 4.6  HGB 12.4  HCT 39.5  MCV 87.8  PLT 204     Micro Results: Recent Results (from the past 240 hour(s))  Resp panel by RT-PCR (RSV, Flu A&B, Covid) Anterior Nasal Swab     Status: None   Collection Time: 07/13/22  6:45 PM   Specimen: Anterior Nasal Swab  Result Value Ref Range Status   SARS Coronavirus 2 by RT PCR NEGATIVE NEGATIVE Final   Influenza A by PCR NEGATIVE NEGATIVE Final   Influenza B by PCR NEGATIVE NEGATIVE Final    Comment: (NOTE) The Xpert  Xpress SARS-CoV-2/FLU/RSV plus assay is intended as an aid in the diagnosis of influenza from Nasopharyngeal swab specimens and should not be used as a sole basis for treatment. Nasal washings and aspirates are unacceptable for Xpert Xpress SARS-CoV-2/FLU/RSV testing.  Fact Sheet for Patients: EntrepreneurPulse.com.au  Fact Sheet for Healthcare Providers: IncredibleEmployment.be  This test is not yet approved or cleared by the [place] FDA and has been authorized for detection and/or diagnosis of SARS-CoV-2 by FDA under an Emergency Use Authorization (EUA). This EUA will remain in effect (meaning this test can be used) for the duration of the COVID-19 declaration under Section 564(b)(1) of the Act, 21 U.S.C. section 360bbb-3(b)(1), unless the authorization is terminated or revoked.     Resp Syncytial Virus by PCR NEGATIVE NEGATIVE Final    Comment: (NOTE) Fact Sheet for Patients: EntrepreneurPulse.com.au  Fact Sheet for Healthcare Providers: IncredibleEmployment.be  This test is not yet approved or cleared by the [place] FDA and has been authorized for detection and/or diagnosis of SARS-CoV-2 by FDA under an Emergency Use  Authorization (EUA). This EUA will remain in effect (meaning this test can be used) for the duration of the COVID-19 declaration under Section 564(b)(1) of the Act, 21 U.S.C. section 360bbb-3(b)(1), unless the authorization is terminated or revoked.  Performed at [facility], [place] [address], [place], [place] [address]      Radiological Exams on Admission: No results found.  Assessment/Plan Present on Admission:  Acute CHF -CHF order set initiated -Continue IV Lasix 20 mg IV twice daily -Daily weights, serial H&H -2D echo in a.m. -Cardiology consult placed -Patient diuresing well.  Approximately 2 L normal saline output so far in the ER.   Atrial fibrillation (HCC) -Chronic, at baseline.  Patient not in RVR. -Resume Coumadin, pharmacy to dose -No medications for rate control noted  LLE stasis ulcer -Wound care consulted.  Patient states she has visiting home nursing twice weekly for this -PT consult PT consult   RLS -Requip ordered with repeat doses needed   Depression, major, single episode, moderate ([place]) -Resume patient's Cymbalta   Essential hypertension, benign -Resume hydralazine, Norvasc and Cozaar  Lorrin Nawrot 07/13/2022, 10:41 PM

## 2022-07-14 ENCOUNTER — Inpatient Hospital Stay (HOSPITAL_COMMUNITY): Payer: Medicare HMO

## 2022-07-14 DIAGNOSIS — E876 Hypokalemia: Secondary | ICD-10-CM | POA: Diagnosis not present

## 2022-07-14 DIAGNOSIS — I509 Heart failure, unspecified: Secondary | ICD-10-CM

## 2022-07-14 DIAGNOSIS — I10 Essential (primary) hypertension: Secondary | ICD-10-CM | POA: Diagnosis not present

## 2022-07-14 DIAGNOSIS — I5031 Acute diastolic (congestive) heart failure: Secondary | ICD-10-CM

## 2022-07-14 DIAGNOSIS — S22080D Wedge compression fracture of T11-T12 vertebra, subsequent encounter for fracture with routine healing: Secondary | ICD-10-CM

## 2022-07-14 DIAGNOSIS — I5033 Acute on chronic diastolic (congestive) heart failure: Secondary | ICD-10-CM

## 2022-07-14 DIAGNOSIS — F321 Major depressive disorder, single episode, moderate: Secondary | ICD-10-CM | POA: Diagnosis not present

## 2022-07-14 DIAGNOSIS — I4811 Longstanding persistent atrial fibrillation: Secondary | ICD-10-CM | POA: Diagnosis not present

## 2022-07-14 LAB — CBC
HCT: 37.2 % (ref 36.0–46.0)
Hemoglobin: 12.2 g/dL (ref 12.0–15.0)
MCH: 28 pg (ref 26.0–34.0)
MCHC: 32.8 g/dL (ref 30.0–36.0)
MCV: 85.3 fL (ref 80.0–100.0)
Platelets: 179 10*3/uL (ref 150–400)
RBC: 4.36 MIL/uL (ref 3.87–5.11)
RDW: 17.2 % — ABNORMAL HIGH (ref 11.5–15.5)
WBC: 6.8 10*3/uL (ref 4.0–10.5)
nRBC: 0 % (ref 0.0–0.2)

## 2022-07-14 LAB — ECHOCARDIOGRAM COMPLETE
Area-P 1/2: 4.71 cm2
Height: 63 in
S' Lateral: 3.6 cm
Weight: 2574.97 oz

## 2022-07-14 LAB — PROTIME-INR
INR: 3.2 — ABNORMAL HIGH (ref 0.8–1.2)
Prothrombin Time: 32.8 seconds — ABNORMAL HIGH (ref 11.4–15.2)

## 2022-07-14 LAB — MAGNESIUM: Magnesium: 1.8 mg/dL (ref 1.7–2.4)

## 2022-07-14 LAB — BASIC METABOLIC PANEL
Anion gap: 9 (ref 5–15)
BUN: 18 mg/dL (ref 8–23)
CO2: 27 mmol/L (ref 22–32)
Calcium: 8.9 mg/dL (ref 8.9–10.3)
Chloride: 104 mmol/L (ref 98–111)
Creatinine, Ser: 0.85 mg/dL (ref 0.44–1.00)
GFR, Estimated: >60 mL/min (ref >60)
Glucose, Bld: 105 mg/dL — ABNORMAL HIGH (ref 70–99)
Potassium: 3.1 mmol/L — ABNORMAL LOW (ref 3.5–5.1)
Sodium: 140 mmol/L (ref 135–145)

## 2022-07-14 MED ORDER — HYDRALAZINE HCL 25 MG PO TABS
25.0000 mg | ORAL_TABLET | Freq: Two times a day (BID) | ORAL | Status: DC
  Administered 2022-07-14 – 2022-07-15 (×4): 25 mg via ORAL
  Filled 2022-07-14 (×4): qty 1

## 2022-07-14 MED ORDER — ACETAMINOPHEN 325 MG PO TABS: 650.0000 mg | ORAL_TABLET | ORAL | Status: DC | PRN

## 2022-07-14 MED ORDER — SODIUM CHLORIDE 0.9% FLUSH: 3.0000 mL | INTRAVENOUS | Status: DC | PRN

## 2022-07-14 MED ORDER — ACETAMINOPHEN 325 MG PO TABS: 650.0000 mg | ORAL_TABLET | Freq: Four times a day (QID) | ORAL | Status: DC | PRN

## 2022-07-14 MED ORDER — FUROSEMIDE 10 MG/ML IJ SOLN: 20.0000 mg | Freq: Two times a day (BID) | INTRAMUSCULAR | Status: DC

## 2022-07-14 MED ORDER — LOSARTAN POTASSIUM 50 MG PO TABS
50.0000 mg | ORAL_TABLET | Freq: Every day | ORAL | Status: DC
  Administered 2022-07-14 – 2022-07-15 (×2): 50 mg via ORAL
  Filled 2022-07-14 (×3): qty 1

## 2022-07-14 MED ORDER — MELATONIN 5 MG PO TABS
5.0000 mg | ORAL_TABLET | Freq: Every evening | ORAL | Status: DC | PRN
  Administered 2022-07-14 (×2): 5 mg via ORAL
  Filled 2022-07-14 (×2): qty 1

## 2022-07-14 MED ORDER — AMLODIPINE BESYLATE 5 MG PO TABS
5.0000 mg | ORAL_TABLET | Freq: Every day | ORAL | Status: DC
  Administered 2022-07-14 – 2022-07-15 (×2): 5 mg via ORAL
  Filled 2022-07-14 (×2): qty 1

## 2022-07-14 MED ORDER — SPIRONOLACTONE 12.5 MG HALF TABLET
12.5000 mg | ORAL_TABLET | Freq: Every day | ORAL | Status: DC
  Administered 2022-07-14 – 2022-07-20 (×7): 12.5 mg via ORAL
  Filled 2022-07-14 (×7): qty 1

## 2022-07-14 MED ORDER — DULOXETINE HCL 30 MG PO CPEP
30.0000 mg | ORAL_CAPSULE | Freq: Every day | ORAL | Status: DC
  Administered 2022-07-14 – 2022-07-17 (×4): 30 mg via ORAL
  Filled 2022-07-14 (×4): qty 1

## 2022-07-14 MED ORDER — ROPINIROLE HCL 0.5 MG PO TABS
0.5000 mg | ORAL_TABLET | Freq: Two times a day (BID) | ORAL | Status: DC
  Administered 2022-07-14 – 2022-07-20 (×11): 0.5 mg via ORAL
  Filled 2022-07-14 (×15): qty 1

## 2022-07-14 MED ORDER — SENNOSIDES-DOCUSATE SODIUM 8.6-50 MG PO TABS: 1.0000 | ORAL_TABLET | Freq: Every evening | ORAL | Status: DC | PRN

## 2022-07-14 MED ORDER — FUROSEMIDE 10 MG/ML IJ SOLN: 40.0000 mg | Freq: Two times a day (BID) | INTRAMUSCULAR | Status: DC

## 2022-07-14 MED ORDER — METOPROLOL TARTRATE 25 MG PO TABS
12.5000 mg | ORAL_TABLET | Freq: Two times a day (BID) | ORAL | Status: DC
  Administered 2022-07-14 – 2022-07-15 (×2): 12.5 mg via ORAL
  Filled 2022-07-14 (×2): qty 1

## 2022-07-14 MED ORDER — ONDANSETRON HCL 4 MG/2ML IJ SOLN
4.0000 mg | Freq: Four times a day (QID) | INTRAMUSCULAR | Status: DC | PRN
  Administered 2022-07-14: 4 mg via INTRAVENOUS
  Filled 2022-07-14: qty 2

## 2022-07-14 MED ORDER — ROPINIROLE HCL 1 MG PO TABS
0.5000 mg | ORAL_TABLET | Freq: Every evening | ORAL | Status: DC | PRN
  Administered 2022-07-14: 0.5 mg via ORAL
  Filled 2022-07-14: qty 1
  Filled 2022-07-14: qty 0.5

## 2022-07-14 MED ORDER — ROPINIROLE HCL 0.25 MG PO TABS
0.2500 mg | ORAL_TABLET | Freq: Two times a day (BID) | ORAL | Status: DC | PRN
  Administered 2022-07-15: 0.25 mg via ORAL
  Filled 2022-07-14: qty 1

## 2022-07-14 MED ORDER — WARFARIN 0.5 MG HALF TABLET
0.5000 mg | ORAL_TABLET | Freq: Once | ORAL | Status: DC
  Filled 2022-07-14: qty 1

## 2022-07-14 MED ORDER — WARFARIN - PHARMACIST DOSING INPATIENT: Freq: Every day | Status: DC

## 2022-07-14 MED ORDER — POTASSIUM CHLORIDE CRYS ER 20 MEQ PO TBCR
40.0000 meq | EXTENDED_RELEASE_TABLET | ORAL | Status: AC
  Administered 2022-07-14: 40 meq via ORAL
  Filled 2022-07-14: qty 2

## 2022-07-14 MED ORDER — FUROSEMIDE 10 MG/ML IJ SOLN
40.0000 mg | Freq: Once | INTRAMUSCULAR | Status: AC
  Administered 2022-07-14: 40 mg via INTRAVENOUS
  Filled 2022-07-14: qty 4

## 2022-07-14 MED ORDER — SODIUM CHLORIDE 0.9% FLUSH
3.0000 mL | Freq: Two times a day (BID) | INTRAVENOUS | Status: DC
  Administered 2022-07-14 – 2022-07-20 (×13): 3 mL via INTRAVENOUS

## 2022-07-14 MED ORDER — ROPINIROLE HCL 0.25 MG PO TABS: 0.2500 mg | ORAL_TABLET | Freq: Every day | ORAL | Status: DC | PRN

## 2022-07-14 MED ORDER — METOCLOPRAMIDE HCL 5 MG/ML IJ SOLN
5.0000 mg | Freq: Four times a day (QID) | INTRAMUSCULAR | Status: DC | PRN
  Administered 2022-07-14: 5 mg via INTRAVENOUS
  Filled 2022-07-14: qty 2

## 2022-07-14 MED ORDER — SODIUM CHLORIDE 0.9 % IV SOLN: 250.0000 mL | INTRAVENOUS | Status: DC | PRN

## 2022-07-14 MED ORDER — ACETAMINOPHEN 650 MG RE SUPP: 650.0000 mg | Freq: Four times a day (QID) | RECTAL | Status: DC | PRN

## 2022-07-14 MED ORDER — WARFARIN 0.5 MG HALF TABLET
0.5000 mg | ORAL_TABLET | Freq: Once | ORAL | Status: AC
  Administered 2022-07-14: 0.5 mg via ORAL
  Filled 2022-07-14 (×2): qty 1

## 2022-07-14 NOTE — Assessment & Plan Note (Signed)
Renal function stable, K is 3,1 and Mg is 1,8 Add 40 meq Kcl x 2 doses and 2 g mag sulfate Continue diuresis and follow up renal panel in am. Avoid hypotension and nephrotoxic medications.

## 2022-07-14 NOTE — Care Management CC44 (Signed)
Condition Code 44 Documentation Completed  Patient Details  Name: Danielle Thomas MRN: [id number] Date of Birth: 07/11/1927   Condition Code 44 given:  Yes Patient signature on Condition Code 44 notice:  Yes Documentation of 2 MD's agreement:  Yes Code 44 added to claim:  Yes    Fuller Mandril, RN 07/14/2022, 3:13 PM

## 2022-07-14 NOTE — Care Management Obs Status (Signed)
[facility] NOTIFICATION   Patient Details  Name: SHEENAH SLIFKA MRN: [id number] Date of Birth: [birth year]   Medicare Observation Status Notification Given:  Yes    Fuller Mandril, RN 07/14/2022, 3:13 PM

## 2022-07-14 NOTE — Progress Notes (Signed)
ANTICOAGULATION CONSULT NOTE - Initial Consult  Pharmacy Consult for Coumadin Indication: atrial fibrillation  Allergies  Allergen Reactions   Amlodipine Besylate     Other reaction(s): high dose make feet swell   Codeine Nausea And Vomiting   Meperidine Nausea Only   Metoprolol Succinate [Metoprolol]     Other reaction(s): effects her breathing   Oxycodone Nausea And Vomiting    Patient Measurements: Height: '5\' 3"'$  (160 cm) Weight: 73 kg (160 lb 15 oz) IBW/kg (Calculated) : 52.4  Vital Signs: Temp: 97.6 F (36.4 C) (03/08 1130) Temp Source: Oral (03/08 1130) BP: 127/93 (03/08 1000) Pulse Rate: 68 (03/08 1000)  Labs: Recent Labs    07/13/22 1845 07/13/22 2030 07/14/22 0329  HGB 12.4  --  12.2  HCT 39.5  --  37.2  PLT 204  --  179  LABPROT 36.5*  --  32.8*  INR 3.7*  --  3.2*  CREATININE 0.91  --  0.85  TROPONINIHS 19* 21*  --      Estimated Creatinine Clearance: 40.4 mL/min (by C-G formula based on SCr of 0.85 mg/dL).   Medical History: Past Medical History:  Diagnosis Date   Acute CHF (congestive heart failure) ([place]) 07/13/2022   Hypertension     Medications:  No current facility-administered medications on file prior to encounter.   Current Outpatient Medications on File Prior to Encounter  Medication Sig Dispense Refill   amLODipine (NORVASC) 5 MG tablet Take 5 mg by mouth daily.     dorzolamide (TRUSOPT) 2 % ophthalmic solution Place 1 drop into both eyes 2 (two) times daily.      hydrALAZINE (APRESOLINE) 25 MG tablet Take 25 mg by mouth in the morning and at bedtime.     latanoprost (XALATAN) 0.005 % ophthalmic solution Place 1 drop into both eyes at bedtime. 2.5 mL 0   rOPINIRole (REQUIP) 0.25 MG tablet Take 1 tablet (0.25 mg total) by mouth daily as needed (restless legs around 10am - home dose). (Patient taking differently: Take 0.25 mg by mouth 4 (four) times daily as needed (restless leg). Taken 4pm and 8pm and an additional dose in the middle of  the night and possibly in the morning.) 30 tablet 0   warfarin (COUMADIN) 1 MG tablet Take 1 tablet (1 mg total) by mouth every Tuesday, Thursday, and Saturday at 6 PM. On Tues, Thurs, Sat (Patient taking differently: Take 1-1.5 mg by mouth See admin instructions. Take 1 tablet by mouth daily at 6pm on Mon, Wed, Fri, Sat. Take 1.'5mg'$  daily at 6pm on Sun, Tues, Thurs) 15 tablet 0   [DISCONTINUED] olmesartan (BENICAR) 40 MG tablet Take 40 mg by mouth daily.     [DISCONTINUED] warfarin (COUMADIN) 3 MG tablet Take 0.5 tablets (1.5 mg total) by mouth every Monday,Wednesday,Friday, and Sunday at 6 PM. (Patient taking differently: Take 1.5 mg by mouth See admin instructions. Take 1/2 tablet by mouth daily at 6pm on Sun, Tues, Thu) 15 tablet 0   acetaminophen (TYLENOL) 500 MG tablet Take 650 mg by mouth every 6 (six) hours as needed. (Patient not taking: Reported on 07/14/2022)     DULoxetine (CYMBALTA) 30 MG capsule Take 1 capsule (30 mg total) by mouth daily. (Patient not taking: Reported on 07/14/2022) 30 capsule 0   losartan (COZAAR) 50 MG tablet Take 1 tablet (50 mg total) by mouth daily. (Patient not taking: Reported on 07/14/2022) 30 tablet 0   [DISCONTINUED] bisacodyl (DULCOLAX) 10 MG suppository Place 10 mg rectally daily as needed  for moderate constipation.     [DISCONTINUED] bisacodyl (DULCOLAX) 5 MG EC tablet Take 1 tablet (5 mg total) by mouth daily as needed for moderate constipation.     [DISCONTINUED] Ensure (ENSURE) Take 237 mLs by mouth 2 (two) times daily between meals.     [DISCONTINUED] fentaNYL (DURAGESIC) 25 MCG/HR Place 1 patch onto the skin every 3 (three) days. 10 patch 0   [DISCONTINUED] methocarbamol (ROBAXIN) 500 MG tablet Take 1 tablet (500 mg total) by mouth every 8 (eight) hours as needed for muscle spasms. For muscle spasms 20 tablet 0   [DISCONTINUED] Multiple Vitamin (MULTIVITAMIN WITH MINERALS) TABS tablet Take 1 tablet by mouth daily.     [DISCONTINUED] polyethylene glycol (MIRALAX  / GLYCOLAX) 17 g packet Take 17 g by mouth daily as needed for mild constipation.     [DISCONTINUED] psyllium (HYDROCIL/METAMUCIL) 95 % PACK Take 1 packet by mouth daily. 240 each      Assessment: 89 y.o. female admitted with PNA and has a history of AFib on warfarin prior to admission. Pt was taking warfarin '1mg'$  daily on Monday, Wednesday, Friday, and Saturday and 1.'5mg'$  daily on Sunday, Tuesday, and Thursdays. Last dose was 07/12/22 at 1800. Pharmacy consulted to dose warfarin.   INR trending down from 3.7 to 3.2.  Hgb 12.2, Plt 179 - stable No s/sx of bleeding  Goal of Therapy:  INR 2-3 Monitor platelets by anticoagulation protocol: Yes   Plan:  Warfarin 0.'5mg'$  PO x1 tonight Monitor daily INR and for s/sx of bleeding   Luisa Hart, PharmD, BCPS Clinical Pharmacist 07/14/2022 1:03 PM   Please refer to AMION for pharmacy phone number

## 2022-07-14 NOTE — Progress Notes (Cosign Needed)
Transition of Care [facility]) - Emergency Department Mini Assessment   Patient Details  Name: Danielle Thomas MRN: [id number] Date of Birth: 1928/05/04  Transition of Care [facility]) CM/SW Contact:    Fuller Mandril, RN Phone Number: 07/14/2022, 8:29 AM   Clinical Narrative: Pt currently active with [facility] for Home Health services RN as confirmed by [facility] with [facility].  Pt will resume [place] services with [facility] should home with home health be her disposition.     ED Mini Assessment: What brought you to the Emergency Department? : (P) Sent by MD office for possible pneumonia  Barriers to Discharge: (P) Continued Medical Work up             Patient Contact and Communications        ,                 Admission diagnosis:  Acute CHF (congestive heart failure) ([place]) [I50.9] Patient Active Problem List   Diagnosis Date Noted   Acute CHF (congestive heart failure) ([place]) 07/13/2022   Depression, major, single episode, moderate ([place]) 06/10/2020   Compression fracture of thoracic vertebra with routine healing 05/13/2020   Closed wedge compression fracture of T11 vertebra ([place]) 05/04/2020   Compression fracture of T11 vertebra ([place]) 05/01/2020   Multiple fractures of ribs, left side, initial encounter for closed fracture 05/01/2020   Hypokalemia 03/27/2020   Chest pressure 06/23/2015   Chronic anticoagulation 04/09/2014   Essential hypertension, benign 04/08/2013   Hyperlipidemia 04/08/2013   Atrial fibrillation ([place]) 02/13/2013   PCP:  Cari Caraway, MD Pharmacy:   [place], [place] - [address] [place] [place] [address] Phone: [phone] Fax: [place], [place] [address] Phone: [phone] Fax: [place], [place] [address] Phone: [phone] Fax: [phone]

## 2022-07-14 NOTE — ED Notes (Signed)
Pt medicated to help her sleep, denies any other needs. No acute distress noted.

## 2022-07-14 NOTE — Progress Notes (Addendum)
Progress Note   Patient: Danielle Thomas [id number] DOB: 09/25/1927 DOA: 07/13/2022     1 DOS: the patient was seen and examined on 07/14/2022   Brief hospital course: Mrs. Zucco was admitted to the hospital with the working diagnosis of heart failure exacerbation.   89 yo female with the past medical history of hypertension, atrial fibrillation, restless leg syndrome, and osteoporosis who presented with dyspnea. Patient with worsening dyspnea for the last 3 days, associated with lower extremity edema. Patient was evaluated by her primary care provider who found her in atrial fibrillation with RVR and heart failure decompensation. She was referred to the ED for further evaluation. On her initial physical examination her blood pressure was 167/124, 174/117, HR 72, RR 29 and 02 saturation 90 to 94%, lungs with no wheezing or rales, no rhonchi, heart with S1 and S2 present, irregularly irregular, with no gallops or murmurs, abdomen with no distention and no lower extremity edema.   Na 139, K 4,3 Cl 106 bicarbonate 25 glucose 101 bun 19 cr 0,91  BNP 1,988  High sensitive troponin 19 and 21  Wbc 6.4 hgb 12,4 plt 204  Sars covid 19 negative   EKG 78 bpm, normal axis, normal intervals, atrial fibrillation with PVC, no significant ST segment or T wave changes.      Assessment and Plan: * Atrial fibrillation [facility]) Patient continue in atrial fibrillation but rate is controlled.   Plan to continue telemetry monitoring for 24 hrs more. Will add low dose metoprolol 12 ,5 mg po bid Continue anticoagulation with warfarin.   Acute on chronic diastolic CHF (congestive heart failure) (HCC) Echocardiogram with mild reduction in systolic function with EF 45 to 50%, with global hypokinesis, moderate reduction in RV systolic function, RVSP 66 mmHg, LA and RA with severe dilatation, no significant valvular disease.   Volume status has improved.  Systolic blood pressure in the 150's.   Plan to add one  dose of furosemide IV 40 mg and add oral spironolactone.  Transition to oral furosemide in am. Start metoprolol for B blockade, continue with losartan.   Essential hypertension Hypertensive emergency.  Continue blood pressure control with losartan, hydralazine and amlodipine, will add metoprolol and spironolactone. Continue close blood pressure monitoring.   Hypokalemia Renal function stable, K is 3,1 and Mg is 1,8 Add 40 meq Kcl x 2 doses and 2 g mag sulfate Continue diuresis and follow up renal panel in am. Avoid hypotension and nephrotoxic medications.   Compression fracture of T11 vertebra (HCC) PT and OT evaluation   Depression, major, single episode, moderate (HCC) Continue neuro checks per unit protocol        Subjective: Patient is feeling better, no chest pain or dyspnea, edema has improved, positive nausea and poor appetite  Physical Exam: Vitals:   07/14/22 1000 07/14/22 1130 07/14/22 1200 07/14/22 1230  BP: (!) 127/93 (!) 129/111 130/61 (!) 135/110  Pulse: 68 74  68  Resp: (!) '29 16 17 19  '$ Temp:  97.6 F (36.4 C)    TempSrc:  Oral    SpO2: 94% 94%  94%  Weight:      Height:       Neurology awake and alert ENT with mild pallor Cardiovascular with S1 and S2 present, irregularly irregular with no gallops, rubs or murmurs No JVD Trace lower extremity edema Respiratory with rales at bases with no wheezing Abdomen with no distention  Data Reviewed:    Family Communication: no family at the bedside.  I spoke with patient's daughter over the phone we talked in detail about patient's condition, plan of care and prognosis and all questions were addressed.     Disposition: Status is: Observation The patient remains OBS appropriate and will d/c before 2 midnights.  Planned Discharge Destination: Home     Author: Tawni Millers, MD 07/14/2022 4:45 PM  For on call review [email or web address].

## 2022-07-14 NOTE — Consult Note (Signed)
[facility] Nurse Consult Note: Reason for Consult:LLE venous leg ulcer Wound type:venous insufficiency Pressure Injury POA: N/A Measurement:TO be measured by Bedside RN today with next dressing application and documented on Nursing Flow Sheet Wound bed:red, moist Drainage (amount, consistency, odor) small to moderate serous Periwound: edematous Dressing procedure/placement/frequency:I have provided guidance for Nursing regarding the care of this wound while in house using a NS cleanse, application of a calcium alginate dressing and topping/securing with an ABD pad for comfort. The LE is to be wrapped with a Kerlix roll gauze from just below the toes to just below the knee and this topped with an ACE bandage applied in a similar manner (toe to knee) for mild compression (~73mHg).  Heels are to be floated using bilateral pressure redistribution heel boots and a sacral foam placed for PI prevention in that region.  [facility] upon discharge if it is determined that she can still manage with that level of support.  [facility]nursing team will not follow, but will remain available to this patient, the nursing and medical teams.  Please re-consult if needed.  Thank you for inviting [place] participate in this patient's Plan of Care.  LMaudie Flakes MSN, RN, CNS, GDunreith CSerita Grammes [employer] FUnisys Corporationphone:  [phone]

## 2022-07-14 NOTE — Assessment & Plan Note (Addendum)
Hypertensive emergency.  Discontinue hydralazine, amlodipine and metoprolol.  Continue losartan and spironolactone.

## 2022-07-14 NOTE — Progress Notes (Signed)
ANTICOAGULATION CONSULT NOTE - Initial Consult  Pharmacy Consult for Coumadin Indication: atrial fibrillation  Allergies  Allergen Reactions   Amlodipine Besylate     Other reaction(s): high dose make feet swell   Codeine Nausea And Vomiting   Meperidine Nausea Only   Metoprolol Succinate [Metoprolol]     Other reaction(s): effects her breathing   Oxycodone Nausea And Vomiting    Patient Measurements: Height: '5\' 3"'$  (160 cm) Weight: 73 kg (160 lb 15 oz) IBW/kg (Calculated) : 52.4  Vital Signs: Temp: 97.4 F (36.3 C) (03/07 2345) Temp Source: Oral (03/07 2345) BP: 158/90 (03/07 2330) Pulse Rate: 69 (03/07 2345)  Labs: Recent Labs    07/13/22 1845 07/13/22 2030  HGB 12.4  --   HCT 39.5  --   PLT 204  --   LABPROT 36.5*  --   INR 3.7*  --   CREATININE 0.91  --   TROPONINIHS 19* 21*    Estimated Creatinine Clearance: 37.7 mL/min (by C-G formula based on SCr of 0.91 mg/dL).   Medical History: Past Medical History:  Diagnosis Date   Acute CHF (congestive heart failure) ([place]) 07/13/2022   Hypertension     Medications:  No current facility-administered medications on file prior to encounter.   Current Outpatient Medications on File Prior to Encounter  Medication Sig Dispense Refill   acetaminophen (TYLENOL) 325 MG tablet Take 650 mg by mouth every 6 (six) hours as needed.     bisacodyl (DULCOLAX) 10 MG suppository Place 10 mg rectally daily as needed for moderate constipation.     bisacodyl (DULCOLAX) 5 MG EC tablet Take 1 tablet (5 mg total) by mouth daily as needed for moderate constipation.     dorzolamide (TRUSOPT) 2 % ophthalmic solution Place 1 drop into both eyes 2 (two) times daily.      DULoxetine (CYMBALTA) 30 MG capsule Take 1 capsule (30 mg total) by mouth daily. 30 capsule 0   Ensure (ENSURE) Take 237 mLs by mouth 2 (two) times daily between meals.     fentaNYL (DURAGESIC) 25 MCG/HR Place 1 patch onto the skin every 3 (three) days. 10 patch 0    latanoprost (XALATAN) 0.005 % ophthalmic solution Place 1 drop into both eyes at bedtime. 2.5 mL 0   losartan (COZAAR) 50 MG tablet Take 1 tablet (50 mg total) by mouth daily. 30 tablet 0   methocarbamol (ROBAXIN) 500 MG tablet Take 1 tablet (500 mg total) by mouth every 8 (eight) hours as needed for muscle spasms. For muscle spasms 20 tablet 0   Multiple Vitamin (MULTIVITAMIN WITH MINERALS) TABS tablet Take 1 tablet by mouth daily.     polyethylene glycol (MIRALAX / GLYCOLAX) 17 g packet Take 17 g by mouth daily as needed.     psyllium (HYDROCIL/METAMUCIL) 95 % PACK Take 1 packet by mouth daily. 240 each    rOPINIRole (REQUIP) 0.25 MG tablet Take 1 tablet (0.25 mg total) by mouth daily as needed (restless legs around 10am - home dose). 30 tablet 0   warfarin (COUMADIN) 1 MG tablet Take 1 tablet (1 mg total) by mouth every Tuesday, Thursday, and Saturday at 6 PM. On Tues, Thurs, Sat 15 tablet 0   warfarin (COUMADIN) 3 MG tablet Take 0.5 tablets (1.5 mg total) by mouth every Monday,Wednesday,Friday, and Sunday at 6 PM. 15 tablet 0     Assessment: 89 y.o. female admitted with PNA, h/o Afib, to continue Coumadin.  INR supratherapeutic this evening  Goal of Therapy:  INR 2-3 Monitor platelets by anticoagulation protocol: Yes   Plan:  No Coumadin tonight F/U daily INR F/U current Coumadin regimen  Caryl Pina 07/14/2022,12:37 AM

## 2022-07-14 NOTE — Assessment & Plan Note (Signed)
Continue neuro checks per unit protocol

## 2022-07-14 NOTE — Assessment & Plan Note (Addendum)
Echocardiogram with mild reduction in systolic function with EF 45 to 50%, with global hypokinesis, moderate reduction in RV systolic function, RVSP 66 mmHg, LA and RA with severe dilatation, no significant valvular disease.   Volume status has improved.  Urine output is documented at 300 cc  Plan to reduce losartan to 25 mg and continue spironolactone Discontinue metoprolol and hold on furosemide for now.

## 2022-07-14 NOTE — Evaluation (Signed)
Physical Therapy Evaluation Patient Details Name: Danielle Thomas MRN: [id number] DOB: 1927/11/15 Today's Date: 07/14/2022  History of Present Illness  89 y.o. female presents to [facility] hospital on 07/13/2022 with SOB and LE edema. Pt admitted for management of acute CHF and afib. PMH includes HTN, afib, RLS, osteoporosis, compression fx.  Clinical Impression  Pt presents to PT with deficits in activity tolerance, cardiopulmonary function, endurance, gait. Pt is mobilizing well with support of DME, but does continue to cite DOE when ambulating and desats to high 80s with activity. Pt will benefit from continued mobilization in an effort to improve endurance. PT recommends discharge home with HHPT when medically ready.       Recommendations for follow up therapy are one component of a multi-disciplinary discharge planning process, led by the attending physician.  Recommendations may be updated based on patient status, additional functional criteria and insurance authorization.  Follow Up Recommendations Home health PT      Assistance Recommended at Discharge PRN  Patient can return home with the following  A little help with walking and/or transfers;A little help with bathing/dressing/bathroom;Assistance with cooking/housework;Assist for transportation;Help with stairs or ramp for entrance    Equipment Recommendations None recommended by PT  Recommendations for Other Services       Functional Status Assessment Patient has had a recent decline in their functional status and demonstrates the ability to make significant improvements in function in a reasonable and predictable amount of time.     Precautions / Restrictions Precautions Precautions: Fall Precaution Comments: monitor sats Restrictions Weight Bearing Restrictions: No      Mobility  Bed Mobility Overal bed mobility: Needs Assistance Bed Mobility: Supine to Sit, Sit to Supine     Supine to sit: Supervision Sit to supine:  Supervision        Transfers Overall transfer level: Needs assistance Equipment used: Rolling walker (2 wheels) Transfers: Sit to/from Stand Sit to Stand: Supervision                Ambulation/Gait Ambulation/Gait assistance: Supervision Gait Distance (Feet): 150 Feet Assistive device: Rolling walker (2 wheels) Gait Pattern/deviations: Step-through pattern Gait velocity: reduced Gait velocity interpretation: <1.8 ft/sec, indicate of risk for recurrent falls   General Gait Details: slowed step-through gait  Stairs            Wheelchair Mobility    Modified Rankin (Stroke Patients Only)       Balance Overall balance assessment: Needs assistance Sitting-balance support: No upper extremity supported, Feet supported Sitting balance-Leahy Scale: Good     Standing balance support: Single extremity supported, Reliant on assistive device for balance Standing balance-Leahy Scale: Poor                               Pertinent Vitals/Pain Pain Assessment Pain Assessment: No/denies pain    Home Living Family/patient expects to be discharged to:: Private residence Living Arrangements: Alone Available Help at Discharge: Family;Available PRN/intermittently Type of Home: House Home Access: Stairs to enter Entrance Stairs-Rails: None Entrance Stairs-Number of Steps: 1 Alternate Level Stairs-Number of Steps: 13 Home Layout: Two level;Able to live on main level with bedroom/bathroom Home Equipment: Rollator (4 wheels);Cane - single point      Prior Function Prior Level of Function : Independent/Modified Independent             Mobility Comments: ambulates with use of rollator, uses cane when ambulating to car  Hand Dominance        Extremity/Trunk Assessment   Upper Extremity Assessment Upper Extremity Assessment: Generalized weakness    Lower Extremity Assessment Lower Extremity Assessment: Generalized weakness    Cervical /  Trunk Assessment Cervical / Trunk Assessment: Kyphotic  Communication   Communication: HOH (has hearing aides)  Cognition Arousal/Alertness: Awake/alert Behavior During Therapy: WFL for tasks assessed/performed Overall Cognitive Status: Within Functional Limits for tasks assessed                                          General Comments General comments (skin integrity, edema, etc.): pt on RA at rest with sats in low 90s. pt desats to 88% when ambulating, reporting DOE with mobility. Sats recover to low 90s when resting in bed after mobility    Exercises     Assessment/Plan    PT Assessment Patient needs continued PT services  PT Problem List Decreased strength;Decreased activity tolerance;Decreased balance;Decreased mobility;Cardiopulmonary status limiting activity       PT Treatment Interventions DME instruction;Gait training;Stair training;Functional mobility training;Therapeutic activities;Therapeutic exercise;Balance training;Neuromuscular re-education;Patient/family education    PT Goals (Current goals can be found in the Care Plan section)  Acute Rehab PT Goals Patient Stated Goal: to return home PT Goal Formulation: With patient Time For Goal Achievement: 07/28/22 Potential to Achieve Goals: Good    Frequency Min 3X/week     Co-evaluation               AM-PAC PT "6 Clicks" Mobility  Outcome Measure Help needed turning from your back to your side while in a flat bed without using bedrails?: A Little Help needed moving from lying on your back to sitting on the side of a flat bed without using bedrails?: A Little Help needed moving to and from a bed to a chair (including a wheelchair)?: A Little Help needed standing up from a chair using your arms (e.g., wheelchair or bedside chair)?: A Little Help needed to walk in hospital room?: A Little Help needed climbing 3-5 steps with a railing? : A Lot 6 Click Score: 17    End of Session    Activity Tolerance: Patient tolerated treatment well Patient left: in bed;with call bell/phone within reach Nurse Communication: Mobility status PT Visit Diagnosis: Other abnormalities of gait and mobility (R26.89);Muscle weakness (generalized) (M62.81)    Time: [id number] PT Time Calculation (min) (ACUTE ONLY): 26 min   Charges:   PT Evaluation $PT Eval Low Complexity: [place], PT, DPT Acute Rehabilitation Office [phone]   Zenaida Niece 07/14/2022, 10:36 AM

## 2022-07-14 NOTE — ED Notes (Signed)
ED TO INPATIENT HANDOFF REPORT  ED Nurse Name and Phone #: [phone]  S Name/Age/Gender Danielle Thomas 89 y.o. female Room/Bed: [id number]  Code Status   Code Status: DNR  Home/SNF/Other Home Patient oriented to: self, place, time, and situation Is this baseline? Yes   Triage Complete: Triage complete  Chief Complaint Acute CHF (congestive heart failure) ([place]) [I50.9]  Triage Note Patient sent to ED from [facility] physicians for evaluation of possible pneumonia. Patient complains of shortness of breath that started a few days ago. Patient is alert, oriented, and in no apparent distress at this time.   Allergies Allergies  Allergen Reactions   Amlodipine Besylate     Other reaction(s): high dose make feet swell   Codeine Nausea And Vomiting   Meperidine Nausea Only   Metoprolol Succinate [Metoprolol]     Other reaction(s): effects her breathing   Oxycodone Nausea And Vomiting    Level of Care/Admitting Diagnosis ED Disposition     ED Disposition  Admit   Condition  --   Comment  Hospital Area: [facility] [id number]  Level of Care: Telemetry Cardiac [[id number]]  May admit patient to [facility] or [facility] if equivalent level of care is available:: Yes  Covid Evaluation: Confirmed COVID Negative  Diagnosis: Acute CHF (congestive heart failure) ([place]) [id number]  Admitting Physician: Quintella Baton [[id number]]  Attending Physician: Quintella Baton [id number]  Certification:: I certify this patient will need inpatient services for at least 2 midnights  Estimated Length of Stay: 2          B Medical/Surgery History Past Medical History:  Diagnosis Date   Acute CHF (congestive heart failure) ([place]) 07/13/2022   Hypertension    Past Surgical History:  Procedure Laterality Date   KNEE SURGERY     LUMBAR PERCUTANEOUS PEDICLE SCREW 2 LEVEL N/A 05/04/2020   Procedure: Thoracic nine to Thoracic twelve percutaneous pedicle screws;  Surgeon: Ashok Pall, MD;  Location:  [facility];  Service: Neurosurgery;  Laterality: N/A;   LUNG BIOPSY     PARTIAL HYSTERECTOMY     TONSILLECTOMY AND ADENOIDECTOMY       A IV Location/Drains/Wounds Patient Lines/Drains/Airways Status     Active Line/Drains/Airways     Name Placement date Placement time Site Days   Peripheral IV 07/13/22 20 G 1" Right Antecubital 07/13/22  1932  Antecubital  1   External Urinary Catheter 07/13/22  2012  --  1   Incision (Closed) 05/04/20 Back Other (Comment) 05/04/20  1135  -- 801   Wound / Incision (Open or Dehisced) 05/13/20 (MASD) Moisture Associated Skin Damage Coccyx Right;Left 05/13/20  1558  Coccyx  792            Intake/Output Last 24 hours  Intake/Output Summary (Last 24 hours) at 07/14/2022 1444 Last data filed at 07/13/2022 2315 Gross per 24 hour  Intake --  Output 1700 ml  Net -1700 ml    Labs/Imaging Results for orders placed or performed during the hospital encounter of 07/13/22 (from the past 48 hour(s))  Basic metabolic panel     Status: Abnormal   Collection Time: 07/13/22  6:45 PM  Result Value Ref Range   Sodium 139 135 - 145 mmol/L   Potassium 4.3 3.5 - 5.1 mmol/L   Chloride 106 98 - 111 mmol/L   CO2 25 22 - 32 mmol/L   Glucose, Bld 101 (H) 70 - 99 mg/dL    Comment: Glucose reference range applies only to samples taken after  fasting for at least 8 hours.   BUN 19 8 - 23 mg/dL   Creatinine, Ser 0.91 0.44 - 1.00 mg/dL   Calcium 9.1 8.9 - 10.3 mg/dL   GFR, Estimated 59 (L) >60 mL/min    Comment: (NOTE) Calculated using the CKD-EPI Creatinine Equation (2021)    Anion gap 8 5 - 15    Comment: Performed at [facility] [address], [place], [place] [address]  CBC with Differential     Status: Abnormal   Collection Time: 07/13/22  6:45 PM  Result Value Ref Range   WBC 6.4 4.0 - 10.5 K/uL   RBC 4.50 3.87 - 5.11 MIL/uL   Hemoglobin 12.4 12.0 - 15.0 g/dL   HCT 39.5 36.0 - 46.0 %   MCV 87.8 80.0 - 100.0 fL   MCH 27.6 26.0 - 34.0 pg   MCHC 31.4  30.0 - 36.0 g/dL   RDW 17.5 (H) 11.5 - 15.5 %   Platelets 204 150 - 400 K/uL   nRBC 0.0 0.0 - 0.2 %   Neutrophils Relative % 72 %   Neutro Abs 4.6 1.7 - 7.7 K/uL   Lymphocytes Relative 13 %   Lymphs Abs 0.8 0.7 - 4.0 K/uL   Monocytes Relative 11 %   Monocytes Absolute 0.7 0.1 - 1.0 K/uL   Eosinophils Relative 3 %   Eosinophils Absolute 0.2 0.0 - 0.5 K/uL   Basophils Relative 1 %   Basophils Absolute 0.1 0.0 - 0.1 K/uL   Immature Granulocytes 0 %   Abs Immature Granulocytes 0.02 0.00 - 0.07 K/uL    Comment: Performed at [facility] [address] [place], [place] [address]  Protime-INR     Status: Abnormal   Collection Time: 07/13/22  6:45 PM  Result Value Ref Range   Prothrombin Time 36.5 (H) 11.4 - 15.2 seconds   INR 3.7 (H) 0.8 - 1.2    Comment: (NOTE) INR goal varies based on device and disease states. Performed at [facility], [place] [address], [place], [place] [address]   Brain natriuretic peptide     Status: Abnormal   Collection Time: 07/13/22  6:45 PM  Result Value Ref Range   B Natriuretic Peptide 1,998.9 (H) 0.0 - 100.0 pg/mL    Comment: Performed at [facility] [address] [place], [place] [address]  Troponin I (High Sensitivity)     Status: Abnormal   Collection Time: 07/13/22  6:45 PM  Result Value Ref Range   Troponin I (High Sensitivity) 19 (H) <18 ng/L    Comment: (NOTE) Elevated high sensitivity troponin I (hsTnI) values and significant  changes across serial measurements may suggest ACS but many other  chronic and acute conditions are known to elevate hsTnI results.  Refer to the "Links" section for chest pain algorithms and additional  guidance. Performed at [facility], [place] [address], [place], [place] [address]   Resp panel by RT-PCR (RSV, Flu A&B, Covid) Anterior Nasal Swab     Status: None   Collection Time: 07/13/22  6:45 PM   Specimen: Anterior Nasal Swab  Result Value Ref Range   SARS Coronavirus 2 by RT PCR  NEGATIVE NEGATIVE   Influenza A by PCR NEGATIVE NEGATIVE   Influenza B by PCR NEGATIVE NEGATIVE    Comment: (NOTE) The Xpert Xpress SARS-CoV-2/FLU/RSV plus assay is intended as an aid in the diagnosis of influenza from Nasopharyngeal swab specimens and should not be  used as a sole basis for treatment. Nasal washings and aspirates are unacceptable for Xpert Xpress SARS-CoV-2/FLU/RSV testing.  Fact Sheet for Patients: EntrepreneurPulse.com.au  Fact Sheet for Healthcare Providers: IncredibleEmployment.be  This test is not yet approved or cleared by the [place] FDA and has been authorized for detection and/or diagnosis of SARS-CoV-2 by FDA under an Emergency Use Authorization (EUA). This EUA will remain in effect (meaning this test can be used) for the duration of the COVID-19 declaration under Section 564(b)(1) of the Act, 21 U.S.C. section 360bbb-3(b)(1), unless the authorization is terminated or revoked.     Resp Syncytial Virus by PCR NEGATIVE NEGATIVE    Comment: (NOTE) Fact Sheet for Patients: EntrepreneurPulse.com.au  Fact Sheet for Healthcare Providers: IncredibleEmployment.be  This test is not yet approved or cleared by the [place] FDA and has been authorized for detection and/or diagnosis of SARS-CoV-2 by FDA under an Emergency Use Authorization (EUA). This EUA will remain in effect (meaning this test can be used) for the duration of the COVID-19 declaration under Section 564(b)(1) of the Act, 21 U.S.C. section 360bbb-3(b)(1), unless the authorization is terminated or revoked.  Performed at [facility], [place] [address] [place], [place] [address]   Troponin I (High Sensitivity)     Status: Abnormal   Collection Time: 07/13/22  8:30 PM  Result Value Ref Range   Troponin I (High Sensitivity) 21 (H) <18 ng/L    Comment: (NOTE) Elevated high sensitivity troponin I (hsTnI) values  and significant  changes across serial measurements may suggest ACS but many other  chronic and acute conditions are known to elevate hsTnI results.  Refer to the "Links" section for chest pain algorithms and additional  guidance. Performed at [facility], [place] [address], [place], [place] [id number]   Basic metabolic panel     Status: Abnormal   Collection Time: 07/14/22  3:29 AM  Result Value Ref Range   Sodium 140 135 - 145 mmol/L   Potassium 3.1 (L) 3.5 - 5.1 mmol/L   Chloride 104 98 - 111 mmol/L   CO2 27 22 - 32 mmol/L   Glucose, Bld 105 (H) 70 - 99 mg/dL    Comment: Glucose reference range applies only to samples taken after fasting for at least 8 hours.   BUN 18 8 - 23 mg/dL   Creatinine, Ser 0.85 0.44 - 1.00 mg/dL   Calcium 8.9 8.9 - 10.3 mg/dL   GFR, Estimated >60 >60 mL/min    Comment: (NOTE) Calculated using the CKD-EPI Creatinine Equation (2021)    Anion gap 9 5 - 15    Comment: Performed at [facility] [address] [place], [place] [address]  Magnesium     Status: None   Collection Time: 07/14/22  3:29 AM  Result Value Ref Range   Magnesium 1.8 1.7 - 2.4 mg/dL    Comment: Performed at [facility] [address], [place], [place] [address]  CBC     Status: Abnormal   Collection Time: 07/14/22  3:29 AM  Result Value Ref Range   WBC 6.8 4.0 - 10.5 K/uL   RBC 4.36 3.87 - 5.11 MIL/uL   Hemoglobin 12.2 12.0 - 15.0 g/dL   HCT 37.2 36.0 - 46.0 %   MCV 85.3 80.0 - 100.0 fL   MCH 28.0 26.0 - 34.0 pg   MCHC 32.8 30.0 - 36.0 g/dL   RDW 17.2 (H) 11.5 - 15.5 %   Platelets 179  150 - 400 K/uL   nRBC 0.0 0.0 - 0.2 %    Comment: Performed at [facility], [place] [address], [place], [place] [address]  Protime-INR     Status: Abnormal   Collection Time: 07/14/22  3:29 AM  Result Value Ref Range   Prothrombin Time 32.8 (H) 11.4 - 15.2 seconds   INR 3.2 (H) 0.8 - 1.2    Comment: (NOTE) INR goal varies based on device and disease states. Performed  at [facility], [place] [address] [place], [place] [address]    No results found.  Pending Labs Unresulted Labs (From admission, onward)     Start     Ordered   07/14/22 0500  Protime-INR  Daily,   R      07/14/22 0040            Vitals/Pain Today's Vitals   07/14/22 1000 07/14/22 1130 07/14/22 1200 07/14/22 1230  BP: (!) 127/93 (!) 129/111 130/61 (!) 135/110  Pulse: 68 74  68  Resp: (!) '29 16 17 19  '$ Temp:  97.6 F (36.4 C)    TempSrc:  Oral    SpO2: 94% 94%  94%  Weight:      Height:      PainSc:        Isolation Precautions No active isolations  Medications Medications  melatonin tablet 5 mg (5 mg Oral Given 07/14/22 0135)  acetaminophen (TYLENOL) tablet 650 mg (has no administration in time range)    Or  acetaminophen (TYLENOL) suppository 650 mg (has no administration in time range)  senna-docusate (Senokot-S) tablet 1 tablet (has no administration in time range)  sodium chloride flush (NS) 0.9 % injection 3 mL (3 mLs Intravenous Not Given 07/14/22 0954)  sodium chloride flush (NS) 0.9 % injection 3 mL (has no administration in time range)  0.9 %  sodium chloride infusion (has no administration in time range)  ondansetron (ZOFRAN) injection 4 mg (has no administration in time range)  losartan (COZAAR) tablet 50 mg (50 mg Oral Given 07/14/22 0949)  DULoxetine (CYMBALTA) DR capsule 30 mg (30 mg Oral Given 07/14/22 0949)  Warfarin - Pharmacist Dosing Inpatient (has no administration in time range)  amLODipine (NORVASC) tablet 5 mg (5 mg Oral Given 07/14/22 0315)  hydrALAZINE (APRESOLINE) tablet 25 mg (25 mg Oral Given 07/14/22 0949)  furosemide (LASIX) injection 20 mg (has no administration in time range)  potassium chloride SA (KLOR-CON M) CR tablet 40 mEq (has no administration in time range)  rOPINIRole (REQUIP) tablet 0.5 mg (has no administration in time range)  rOPINIRole (REQUIP) tablet 0.25 mg (has no administration in time range)  furosemide (LASIX)  injection 40 mg (40 mg Intravenous Given 07/13/22 1933)    Mobility walks     Focused Assessments Cardiac Assessment Handoff:    No results found for: "CKTOTAL", "CKMB", "CKMBINDEX", "TROPONINI" No results found for: "DDIMER" Does the Patient currently have chest pain? No    R Recommendations: See Admitting Provider Note  Report given to:   Additional Notes:

## 2022-07-14 NOTE — Assessment & Plan Note (Signed)
-   PT and OT evaluation 

## 2022-07-14 NOTE — Progress Notes (Signed)
  Echocardiogram 2D Echocardiogram has been performed.  Danielle Thomas 07/14/2022, 3:31 PM

## 2022-07-14 NOTE — Hospital Course (Signed)
Danielle Thomas was admitted to the hospital with the working diagnosis of heart failure exacerbation.   89 yo female with the past medical history of hypertension, atrial fibrillation, restless leg syndrome, and osteoporosis who presented with dyspnea. Patient with worsening dyspnea for the last 3 days, associated with lower extremity edema. Patient was evaluated by her primary care provider who found her in atrial fibrillation with RVR and heart failure decompensation. She was referred to the ED for further evaluation. On her initial physical examination her blood pressure was 167/124, 174/117, HR 72, RR 29 and 02 saturation 90 to 94%, lungs with no wheezing or rales, no rhonchi, heart with S1 and S2 present, irregularly irregular, with no gallops or murmurs, abdomen with no distention and no lower extremity edema.   Na 139, K 4,3 Cl 106 bicarbonate 25 glucose 101 bun 19 cr 0,91  BNP 1,988  High sensitive troponin 19 and 21  Wbc 6.4 hgb 12,4 plt 204  Sars covid 19 negative   EKG 78 bpm, normal axis, normal intervals, atrial fibrillation with PVC, no significant ST segment or T wave changes.   03/09 patient placed on metoprolol and developed symptomatic bradycardia.  03/10 patient continue diuresis with IV furosemide, heart rate improved off metoprolol.  03/11 recovered heart rate off AV blockade. Continue volume overloaded.  03/12 improving volume status, but not yet euvolemic.

## 2022-07-14 NOTE — Assessment & Plan Note (Signed)
Telemetry personally reviewed, atrial fibrillation rhythm with rate 55 to 60 bpm.   Avoid AV blockade, possible sick sinus syndrome.  Continue anticoagulation with warfarin, possible transition to DOAC.  Continue telemetry monitoring.

## 2022-07-15 ENCOUNTER — Inpatient Hospital Stay (HOSPITAL_COMMUNITY): Payer: Medicare HMO

## 2022-07-15 DIAGNOSIS — E876 Hypokalemia: Secondary | ICD-10-CM | POA: Diagnosis not present

## 2022-07-15 DIAGNOSIS — J9601 Acute respiratory failure with hypoxia: Secondary | ICD-10-CM | POA: Diagnosis not present

## 2022-07-15 DIAGNOSIS — I495 Sick sinus syndrome: Secondary | ICD-10-CM | POA: Diagnosis present

## 2022-07-15 DIAGNOSIS — F321 Major depressive disorder, single episode, moderate: Secondary | ICD-10-CM | POA: Diagnosis present

## 2022-07-15 DIAGNOSIS — I110 Hypertensive heart disease with heart failure: Secondary | ICD-10-CM | POA: Diagnosis present

## 2022-07-15 DIAGNOSIS — Z66 Do not resuscitate: Secondary | ICD-10-CM | POA: Diagnosis present

## 2022-07-15 DIAGNOSIS — L97929 Non-pressure chronic ulcer of unspecified part of left lower leg with unspecified severity: Secondary | ICD-10-CM | POA: Diagnosis present

## 2022-07-15 DIAGNOSIS — Z1152 Encounter for screening for COVID-19: Secondary | ICD-10-CM | POA: Diagnosis not present

## 2022-07-15 DIAGNOSIS — Z79899 Other long term (current) drug therapy: Secondary | ICD-10-CM | POA: Diagnosis not present

## 2022-07-15 DIAGNOSIS — N179 Acute kidney failure, unspecified: Secondary | ICD-10-CM | POA: Diagnosis not present

## 2022-07-15 DIAGNOSIS — J811 Chronic pulmonary edema: Secondary | ICD-10-CM | POA: Diagnosis not present

## 2022-07-15 DIAGNOSIS — I4821 Permanent atrial fibrillation: Secondary | ICD-10-CM | POA: Diagnosis not present

## 2022-07-15 DIAGNOSIS — R0602 Shortness of breath: Secondary | ICD-10-CM | POA: Diagnosis not present

## 2022-07-15 DIAGNOSIS — I272 Pulmonary hypertension, unspecified: Secondary | ICD-10-CM | POA: Diagnosis present

## 2022-07-15 DIAGNOSIS — I4811 Longstanding persistent atrial fibrillation: Secondary | ICD-10-CM | POA: Diagnosis not present

## 2022-07-15 DIAGNOSIS — I5021 Acute systolic (congestive) heart failure: Secondary | ICD-10-CM

## 2022-07-15 DIAGNOSIS — E785 Hyperlipidemia, unspecified: Secondary | ICD-10-CM | POA: Diagnosis present

## 2022-07-15 DIAGNOSIS — I509 Heart failure, unspecified: Secondary | ICD-10-CM | POA: Diagnosis present

## 2022-07-15 DIAGNOSIS — I5043 Acute on chronic combined systolic (congestive) and diastolic (congestive) heart failure: Secondary | ICD-10-CM | POA: Diagnosis present

## 2022-07-15 DIAGNOSIS — Z888 Allergy status to other drugs, medicaments and biological substances status: Secondary | ICD-10-CM | POA: Diagnosis not present

## 2022-07-15 DIAGNOSIS — Z8673 Personal history of transient ischemic attack (TIA), and cerebral infarction without residual deficits: Secondary | ICD-10-CM | POA: Diagnosis not present

## 2022-07-15 DIAGNOSIS — I10 Essential (primary) hypertension: Secondary | ICD-10-CM | POA: Diagnosis not present

## 2022-07-15 DIAGNOSIS — I480 Paroxysmal atrial fibrillation: Secondary | ICD-10-CM | POA: Diagnosis not present

## 2022-07-15 DIAGNOSIS — Z7901 Long term (current) use of anticoagulants: Secondary | ICD-10-CM | POA: Diagnosis not present

## 2022-07-15 DIAGNOSIS — D6869 Other thrombophilia: Secondary | ICD-10-CM | POA: Diagnosis not present

## 2022-07-15 DIAGNOSIS — I872 Venous insufficiency (chronic) (peripheral): Secondary | ICD-10-CM | POA: Diagnosis present

## 2022-07-15 DIAGNOSIS — I5033 Acute on chronic diastolic (congestive) heart failure: Secondary | ICD-10-CM | POA: Diagnosis not present

## 2022-07-15 DIAGNOSIS — S22080D Wedge compression fracture of T11-T12 vertebra, subsequent encounter for fracture with routine healing: Secondary | ICD-10-CM | POA: Diagnosis not present

## 2022-07-15 DIAGNOSIS — G2581 Restless legs syndrome: Secondary | ICD-10-CM | POA: Diagnosis present

## 2022-07-15 DIAGNOSIS — Z885 Allergy status to narcotic agent status: Secondary | ICD-10-CM | POA: Diagnosis not present

## 2022-07-15 DIAGNOSIS — Z90711 Acquired absence of uterus with remaining cervical stump: Secondary | ICD-10-CM | POA: Diagnosis not present

## 2022-07-15 DIAGNOSIS — E873 Alkalosis: Secondary | ICD-10-CM | POA: Diagnosis not present

## 2022-07-15 DIAGNOSIS — I161 Hypertensive emergency: Secondary | ICD-10-CM | POA: Diagnosis present

## 2022-07-15 LAB — BASIC METABOLIC PANEL
Anion gap: 11 (ref 5–15)
BUN: 22 mg/dL (ref 8–23)
CO2: 27 mmol/L (ref 22–32)
Calcium: 9 mg/dL (ref 8.9–10.3)
Chloride: 100 mmol/L (ref 98–111)
Creatinine, Ser: 1.02 mg/dL — ABNORMAL HIGH (ref 0.44–1.00)
GFR, Estimated: 52 mL/min — ABNORMAL LOW (ref >60)
Glucose, Bld: 114 mg/dL — ABNORMAL HIGH (ref 70–99)
Potassium: 3.9 mmol/L (ref 3.5–5.1)
Sodium: 138 mmol/L (ref 135–145)

## 2022-07-15 LAB — PROTIME-INR
INR: 2.9 — ABNORMAL HIGH (ref 0.8–1.2)
Prothrombin Time: 30.3 seconds — ABNORMAL HIGH (ref 11.4–15.2)

## 2022-07-15 LAB — CBC
HCT: 36.7 % (ref 36.0–46.0)
Hemoglobin: 12 g/dL (ref 12.0–15.0)
MCH: 28 pg (ref 26.0–34.0)
MCHC: 32.7 g/dL (ref 30.0–36.0)
MCV: 85.7 fL (ref 80.0–100.0)
Platelets: 212 10*3/uL (ref 150–400)
RBC: 4.28 MIL/uL (ref 3.87–5.11)
RDW: 17.1 % — ABNORMAL HIGH (ref 11.5–15.5)
WBC: 7 10*3/uL (ref 4.0–10.5)
nRBC: 0 % (ref 0.0–0.2)

## 2022-07-15 LAB — MAGNESIUM: Magnesium: 1.6 mg/dL — ABNORMAL LOW (ref 1.7–2.4)

## 2022-07-15 MED ORDER — WARFARIN 0.5 MG HALF TABLET
0.5000 mg | ORAL_TABLET | Freq: Once | ORAL | Status: AC
  Administered 2022-07-15: 0.5 mg via ORAL
  Filled 2022-07-15: qty 1

## 2022-07-15 MED ORDER — FUROSEMIDE 10 MG/ML IJ SOLN
40.0000 mg | Freq: Two times a day (BID) | INTRAMUSCULAR | Status: DC
  Administered 2022-07-15: 40 mg via INTRAVENOUS
  Filled 2022-07-15: qty 4

## 2022-07-15 MED ORDER — MAGNESIUM SULFATE 4 GM/100ML IV SOLN
4.0000 g | Freq: Once | INTRAVENOUS | Status: AC
  Administered 2022-07-15: 4 g via INTRAVENOUS
  Filled 2022-07-15: qty 100

## 2022-07-15 MED ORDER — LOSARTAN POTASSIUM 25 MG PO TABS
25.0000 mg | ORAL_TABLET | Freq: Every day | ORAL | Status: DC
  Administered 2022-07-16: 25 mg via ORAL
  Filled 2022-07-15: qty 1

## 2022-07-15 MED ORDER — POTASSIUM CHLORIDE CRYS ER 20 MEQ PO TBCR
40.0000 meq | EXTENDED_RELEASE_TABLET | Freq: Once | ORAL | Status: AC
  Administered 2022-07-15: 40 meq via ORAL
  Filled 2022-07-15: qty 2

## 2022-07-15 NOTE — Consult Note (Signed)
Cardiology Consultation   Patient ID: JADRIAN SANTA MRN: [id number]; DOB: December 07, 1927  Admit date: 07/13/2022 Date of Consult: 07/15/2022  PCP:  Cari Caraway, [place] Providers Cardiologist:  Evalina Field, MD  -- New    Patient Profile:   Danielle Thomas is a 89 y.o. female with a hx of atrial fibrillation, HTN, restless leg syndrome, HLD, history of embolic stroke who is being seen 07/15/2022 for the evaluation of atrial fibrillation and CHF at the request of Dr. Cathlean Sauer.  History of Present Illness:   Danielle Thomas is a 89 year old female with above medical history.  Per chart review, patient was previously followed by Dr. Tamala Julian.  She had a nuclear stress test in 07/2015 that showed EF 50%.  Overall, it was a normal, low risk study.  She was last seen by Dr. Tamala Julian in 2017.  At that time, patient was anticoagulated with Coumadin.  Does not appear she was on any rate controlling medications at that time.  Her primary care provider is [place].  Patient was seen by her primary care provider on 3/7.  Apparently, she had gone to her primary care provider for evaluation of shortness of breath that began a few days ago. Her primary care provider was concerned that she had either pneumonia or fluid on her lungs and sent her to the ED for further evaluation.  In the ED, BNP was elevated to 1998.9.  COVID, flu, RSV were negative.  INR was elevated to 3.7.  High sensitive troponin opponent 19, 21. EKG showed atrial fibrillation with PVCs present, heart rate 78 bpm.   Echocardiogram from 07/14/2022 showed EF 45-50%, global hypokinesis, moderately reduced RV systolic function, severely elevated pulmonary artery systolic pressure, severely dilated LA and RA, mild mitral valve regurgitation.  Patient was in atrial fibrillation during the study.  When I evaluated the patient, she was asleep and was not wearing her hearing aids. Her son and daughter are at bedside. They report that  patient has not seen a cardiologist since 2017. Afib has been managed by her PCP. Patient had been in her usual state of health up until about a week and a half ago. At that time, daughter noticed that she was having a hard time breathing. Over the past week, patient started to complain that she was having trouble sleeping due to shortness of breath. Also noticed worsening ankle edema for the past week.   Patient is very independent for her age. She lives alone and is able to drive a car. Her daughters do live close by, and they visit her often   Past Medical History:  Diagnosis Date   Acute CHF (congestive heart failure) ([place]) 07/13/2022   Hypertension     Past Surgical History:  Procedure Laterality Date   KNEE SURGERY     LUMBAR PERCUTANEOUS PEDICLE SCREW 2 LEVEL N/A 05/04/2020   Procedure: Thoracic nine to Thoracic twelve percutaneous pedicle screws;  Surgeon: Ashok Pall, MD;  Location: [facility];  Service: Neurosurgery;  Laterality: N/A;   LUNG BIOPSY     PARTIAL HYSTERECTOMY     TONSILLECTOMY AND ADENOIDECTOMY       Home Medications:  Prior to Admission medications   Medication Sig Start Date End Date Taking? Authorizing Provider  amLODipine (NORVASC) 5 MG tablet Take 5 mg by mouth daily.   Yes [provider]  dorzolamide (TRUSOPT) 2 % ophthalmic solution Place 1 drop into both eyes 2 (two) times daily.  Yes [provider]  hydrALAZINE (APRESOLINE) 25 MG tablet Take 25 mg by mouth in the morning and at bedtime. 06/20/22  Yes [provider]  latanoprost (XALATAN) 0.005 % ophthalmic solution Place 1 drop into both eyes at bedtime. 06/17/20  Yes Fargo, Amy E, NP  rOPINIRole (REQUIP) 0.25 MG tablet Take 1 tablet (0.25 mg total) by mouth daily as needed (restless legs around 10am - home dose). Patient taking differently: Take 0.25 mg by mouth 4 (four) times daily as needed (restless leg). Taken 4pm and 8pm and an additional dose in the middle of the night and  possibly in the morning. 06/17/20  Yes Fargo, Amy E, NP  warfarin (COUMADIN) 1 MG tablet Take 1 tablet (1 mg total) by mouth every Tuesday, Thursday, and Saturday at 6 PM. On Tues, Thurs, Sat Patient taking differently: Take 1-1.5 mg by mouth See admin instructions. Take 1 tablet by mouth daily at 6pm on Mon, Wed, Fri, Sat. Take 1.'5mg'$  daily at 6pm on Sun, Tues, Raynelle Dick 06/17/20  Yes Fargo, Amy E, NP  acetaminophen (TYLENOL) 500 MG tablet Take 650 mg by mouth every 6 (six) hours as needed. Patient not taking: Reported on 07/14/2022    [provider]  DULoxetine (CYMBALTA) 30 MG capsule Take 1 capsule (30 mg total) by mouth daily. Patient not taking: Reported on 07/14/2022 06/17/20   Yvonna Alanis, NP  losartan (COZAAR) 50 MG tablet Take 1 tablet (50 mg total) by mouth daily. Patient not taking: Reported on 07/14/2022 06/17/20   Yvonna Alanis, NP    Inpatient Medications: Scheduled Meds:  DULoxetine  30 mg Oral Daily   [START ON 07/16/2022] losartan  25 mg Oral Daily   rOPINIRole  0.5 mg Oral BID   sodium chloride flush  3 mL Intravenous Q12H   spironolactone  12.5 mg Oral Daily   warfarin  0.5 mg Oral ONCE-1600   Warfarin - Pharmacist Dosing Inpatient   Does not apply q1600   Continuous Infusions:  sodium chloride     PRN Meds: sodium chloride, acetaminophen **OR** acetaminophen, melatonin, metoCLOPramide (REGLAN) injection, ondansetron (ZOFRAN) IV, rOPINIRole, senna-docusate, sodium chloride flush  Allergies:    Allergies  Allergen Reactions   Amlodipine Besylate     Other reaction(s): high dose make feet swell   Codeine Nausea And Vomiting   Meperidine Nausea Only   Metoprolol Succinate [Metoprolol]     Other reaction(s): effects her breathing   Oxycodone Nausea And Vomiting    Social History:   Social History   Socioeconomic History   Marital status: Divorced    Spouse name: Not on file   Number of children: Not on file   Years of education: Not on file   Highest education  level: Not on file  Occupational History   Not on file  Tobacco Use   Smoking status: Never   Smokeless tobacco: Never  Substance and Sexual Activity   Alcohol use: Not on file   Drug use: Not on file   Sexual activity: Not on file  Other Topics Concern   Not on file  Social History Narrative   Not on file   Social Determinants of Health   Financial Resource Strain: Not on file  Food Insecurity: Not on file  Transportation Needs: Not on file  Physical Activity: Not on file  Stress: Not on file  Social Connections: Not on file  Intimate Partner Violence: Not on file    Family History:    Family History  Problem Relation Age of Onset   Other Other        NO HEALTH ISSUES     ROS:  Please see the history of present illness.   All other ROS reviewed and negative.     Physical Exam/Data:   Vitals:   07/15/22 0600 07/15/22 0718 07/15/22 1046 07/15/22 1128  BP: 131/75 (!) 134/97 109/66 117/68  Pulse: (!) 55 60 (!) 44 (!) 45  Resp: '16 20 20 '$ (!) 24  Temp:  98 F (36.7 C) 98 F (36.7 C)   TempSrc:  Oral Oral   SpO2: (!) 82% 97% 96% 97%  Weight:      Height:        Intake/Output Summary (Last 24 hours) at 07/15/2022 1539 Last data filed at 07/15/2022 [id number] Gross per 24 hour  Intake 360 ml  Output 300 ml  Net 60 ml      07/15/2022    5:54 AM 07/13/2022    8:41 PM 06/17/2020   10:02 AM  Last 3 Weights  Weight (lbs) 137 lb 2 oz 160 lb 15 oz 160 lb 3.2 oz  Weight (kg) 62.2 kg 73 kg 72.666 kg     Body mass index is 24.29 kg/m.  General:  thin, elderly female. Laying in the bed with head elevated. Sleeping, but response to voice.  HEENT: normal Neck: no JVD Vascular: Radial pulses 2+ bilaterally Cardiac:  normal S1, S2; RRR; no murmur  Lungs:  crackles in bilateral lung bases. Normal WOB on 4 L supplemental oxygen  Abd: soft, nontender Ext: no edema in BLE Musculoskeletal:  No deformities, BUE and BLE strength normal and equal Skin: warm and dry  Neuro:  CNs  2-12 intact, no focal abnormalities noted Psych:  Normal affect   EKG:  The EKG was personally reviewed and demonstrates:   atrial fibrillation with PVCs present, heart rate 78 bpm.  Telemetry:  Telemetry was personally reviewed and demonstrates:  Atrial Fibrillation, HR in the 60s-70s, now in the 40s   Relevant CV Studies:  Echocardiogram 07/14/22  1. Left ventricular ejection fraction, by estimation, is 45 to 50%. The  left ventricle has mildly decreased function. The left ventricle  demonstrates global hypokinesis. Left ventricular diastolic parameters are  indeterminate.   2. Right ventricular systolic function is moderately reduced. The right  ventricular size is mildly enlarged. There is severely elevated pulmonary  artery systolic pressure. The estimated right ventricular systolic  pressure is 99991111 mmHg.   3. Left atrial size was severely dilated.   4. Right atrial size was severely dilated.   5. The mitral valve is normal in structure. Mild mitral valve  regurgitation. No evidence of mitral stenosis.   6. The aortic valve is tricuspid. There is mild calcification of the  aortic valve. Aortic valve regurgitation is trivial. No aortic stenosis is  present.   7. The inferior vena cava is dilated in size with <50% respiratory  variability, suggesting right atrial pressure of 15 mmHg.   8. The patient was in atrial fibrillation.   Laboratory Data:  High Sensitivity Troponin:   Recent Labs  Lab 07/13/22 1845 07/13/22 2030  TROPONINIHS 19* 21*     Chemistry Recent Labs  Lab 07/13/22 1845 07/14/22 0329 07/15/22 0026  NA 139 140 138  K 4.3 3.1* 3.9  CL 106 104 100  CO2 '25 27 27  '$ GLUCOSE 101* 105* 114*  BUN '19 18 22  '$ CREATININE 0.91 0.85 1.02*  CALCIUM 9.1 8.9 9.0  MG  --  1.8 1.6*  GFRNONAA 59* >60 52*  ANIONGAP '8 9 11    '$ No results for input(s): "PROT", "ALBUMIN", "AST", "ALT", "ALKPHOS", "BILITOT" in the last 168 hours. Lipids No results for input(s): "CHOL",  "TRIG", "HDL", "LABVLDL", "LDLCALC", "CHOLHDL" in the last 168 hours.  Hematology Recent Labs  Lab 07/13/22 1845 07/14/22 0329 07/15/22 0026  WBC 6.4 6.8 7.0  RBC 4.50 4.36 4.28  HGB 12.4 12.2 12.0  HCT 39.5 37.2 36.7  MCV 87.8 85.3 85.7  MCH 27.6 28.0 28.0  MCHC 31.4 32.8 32.7  RDW 17.5* 17.2* 17.1*  PLT 204 179 212   Thyroid No results for input(s): "TSH", "FREET4" in the last 168 hours.  BNP Recent Labs  Lab 07/13/22 1845  BNP 1,998.9*    DDimer No results for input(s): "DDIMER" in the last 168 hours.   Radiology/Studies:  ECHOCARDIOGRAM COMPLETE  Result Date: 07/14/2022    ECHOCARDIOGRAM REPORT   Patient Name:   Danielle Thomas Date of Exam: 07/14/2022 Medical Rec #:  [id number]      Height:       63.0 in Accession #:    [id number]     Weight:       160.9 lb Date of Birth:  06-17-1927      BSA:          1.763 m Patient Age:    21 years       BP:           135/100 mmHg Patient Gender: F              HR:           67 bpm. Exam Location:  Inpatient Procedure: 2D Echo, Cardiac Doppler and Color Doppler Indications:    acute diastolic chf  History:        Patient has prior history of Echocardiogram examinations, most                 recent 09/03/2006. Arrythmias:Atrial Fibrillation,                 Signs/Symptoms:Shortness of Breath and Edema; Risk                 Factors:Hypertension and Dyslipidemia.  Sonographer:    Johny Chess RDCS Referring Phys: Eff[place]  1. Left ventricular ejection fraction, by estimation, is 45 to 50%. The left ventricle has mildly decreased function. The left ventricle demonstrates global hypokinesis. Left ventricular diastolic parameters are indeterminate.  2. Right ventricular systolic function is moderately reduced. The right ventricular size is mildly enlarged. There is severely elevated pulmonary artery systolic pressure. The estimated right ventricular systolic pressure is 99991111 mmHg.  3. Left atrial size was severely dilated.  4.  Right atrial size was severely dilated.  5. The mitral valve is normal in structure. Mild mitral valve regurgitation. No evidence of mitral stenosis.  6. The aortic valve is tricuspid. There is mild calcification of the aortic valve. Aortic valve regurgitation is trivial. No aortic stenosis is present.  7. The inferior vena cava is dilated in size with <50% respiratory variability, suggesting right atrial pressure of 15 mmHg.  8. The patient was in atrial fibrillation. FINDINGS  Left Ventricle: Left ventricular ejection fraction, by estimation, is 45 to 50%. The left ventricle has mildly decreased function. The left ventricle demonstrates global hypokinesis. The left ventricular internal cavity size was normal in size. There is  no left ventricular hypertrophy. Left ventricular  diastolic parameters are indeterminate. Right Ventricle: The right ventricular size is mildly enlarged. No increase in right ventricular wall thickness. Right ventricular systolic function is moderately reduced. There is severely elevated pulmonary artery systolic pressure. The tricuspid regurgitant velocity is 3.57 m/s, and with an assumed right atrial pressure of 15 mmHg, the estimated right ventricular systolic pressure is 99991111 mmHg. Left Atrium: Left atrial size was severely dilated. Right Atrium: Right atrial size was severely dilated. Pericardium: There is no evidence of pericardial effusion. Mitral Valve: The mitral valve is normal in structure. There is mild calcification of the mitral valve leaflet(s). Mild to moderate mitral annular calcification. Mild mitral valve regurgitation. No evidence of mitral valve stenosis. Tricuspid Valve: The tricuspid valve is normal in structure. Tricuspid valve regurgitation is mild. Aortic Valve: The aortic valve is tricuspid. There is mild calcification of the aortic valve. Aortic valve regurgitation is trivial. No aortic stenosis is present. Pulmonic Valve: The pulmonic valve was normal in  structure. Pulmonic valve regurgitation is not visualized. Aorta: The aortic root is normal in size and structure. Venous: The inferior vena cava is dilated in size with less than 50% respiratory variability, suggesting right atrial pressure of 15 mmHg. IAS/Shunts: No atrial level shunt detected by color flow Doppler.  LEFT VENTRICLE PLAX 2D LVIDd:         4.70 cm LVIDs:         3.60 cm LV PW:         0.90 cm LV IVS:        1.25 cm  RIGHT VENTRICLE          IVC RV Basal diam:  3.60 cm  IVC diam: 2.20 cm LEFT ATRIUM              Index        RIGHT ATRIUM           Index LA diam:        4.50 cm  2.55 cm/m   RA Area:     32.40 cm LA Vol (A2C):   109.0 ml 61.82 ml/m  RA Volume:   114.00 ml 64.66 ml/m LA Vol (A4C):   70.3 ml  39.87 ml/m LA Biplane Vol: 91.1 ml  51.67 ml/m   AORTA Ao Root diam: 3.50 cm Ao Asc diam:  3.40 cm MITRAL VALVE               TRICUSPID VALVE MV Area (PHT): 4.71 cm    TR Peak grad:   51.0 mmHg MV Decel Time: 161 msec    TR Vmax:        357.00 cm/s MV E velocity: 72.20 cm/s MV A velocity: 24.50 cm/s MV E/A ratio:  2.95 Dalton McleanMD Electronically signed by Franki Monte Signature Date/Time: 07/14/2022/3:41:59 PM    Final      Assessment and Plan:   Acute on Chronic HFmrEF - Patient presented on 3/8 complaining of shortness of breath. BNP elevated to 1998.9. hsTn 19>21.  - Echocardiogram this admission showed EF 45-50%, moderately reduced RV systolic function, severely elevated pulmonary artery systolic pressure  - Patient has received 2 doses of IV lasix 40 mg-- I/Os incomplete.  - Patient continues to require 4 L supplemental oxygen. Not on oxygen at home. Also has crackles in lung bases - Consider CXR-- patient did have a chest x-ray at her PCP office, but it is not in our system  - Given an additional dose of IV lasix 40 mg today  -  Continue losartan 25 mg daily, spironolactone 12.5 mg daily  - No BB due to bradycardia on low dose metoprolol  - Further GDMT limited by BP--  given patient's advanced age, careful to prevent hypotension   Paroxysmal Atrial Fibrillation  - Patient has a long history of atrial fibrillation, first diagnosed prior to 2014 - Patient was started on metoprolol tartrate 12.5 mg BID, but did develop bradycardia. Now, she remains rate controlled without AV nodal blocking medications  - Continue coumadin for anticoagulation-- discussed DOAC with family, they would like to further discuss with patient prior to deciding   HTN  - BP well controlled on losartan and spironolactone as above   Risk Assessment/Risk Scores:   New York Heart Association (NYHA) Functional Class NYHA Class IV  CHA2DS2-VASc Score = 6  This indicates a 9.7% annual risk of stroke. The patient's score is based upon: CHF History: 0 HTN History: 1 Diabetes History: 0 Stroke History: 2 Vascular Disease History: 0 Age Score: 2 Gender Score: 1   For questions or updates, please contact [place] Please consult [email or web address] for contact info under    Signed, Charday Ebersole, PA-C  07/15/2022 3:39 PM

## 2022-07-15 NOTE — Progress Notes (Signed)
Progress Note   Patient: Danielle Thomas [id number] DOB: 1928-03-10 DOA: 07/13/2022     1 DOS: the patient was seen and examined on 07/15/2022   Brief hospital course: Mrs. Kromer was admitted to the hospital with the working diagnosis of heart failure exacerbation.   89 yo female with the past medical history of hypertension, atrial fibrillation, restless leg syndrome, and osteoporosis who presented with dyspnea. Patient with worsening dyspnea for the last 3 days, associated with lower extremity edema. Patient was evaluated by her primary care provider who found her in atrial fibrillation with RVR and heart failure decompensation. She was referred to the ED for further evaluation. On her initial physical examination her blood pressure was 167/124, 174/117, HR 72, RR 29 and 02 saturation 90 to 94%, lungs with no wheezing or rales, no rhonchi, heart with S1 and S2 present, irregularly irregular, with no gallops or murmurs, abdomen with no distention and no lower extremity edema.   Na 139, K 4,3 Cl 106 bicarbonate 25 glucose 101 bun 19 cr 0,91  BNP 1,988  High sensitive troponin 19 and 21  Wbc 6.4 hgb 12,4 plt 204  Sars covid 19 negative   EKG 78 bpm, normal axis, normal intervals, atrial fibrillation with PVC, no significant ST segment or T wave changes.   03/09 patient placed on metoprolol and developed symptomatic bradycardia.   Assessment and Plan: * Atrial fibrillation ([place]) Patient was placed on metoprolol and developed bradycardia.   Discontinue metoprolol. Possible sick sinus syndrome. Plan to consult cardiology for further recommendations for management of atrial fibrillation.   Acute on chronic diastolic CHF (congestive heart failure) (HCC) Echocardiogram with mild reduction in systolic function with EF 45 to 50%, with global hypokinesis, moderate reduction in RV systolic function, RVSP 66 mmHg, LA and RA with severe dilatation, no significant valvular disease.   Volume status  has improved.  Urine output is documented at 300 cc  Plan to reduce losartan to 25 mg and continue spironolactone Discontinue metoprolol and hold on furosemide for now.    Essential hypertension Hypertensive emergency.  Discontinue hydralazine, amlodipine and metoprolol.  Continue losartan and spironolactone.   Hypokalemia Hypomagnesemia.   Renal function with serum cr art 1,0 with K at 3,9 and serum bicarbonate at 27. Continue close monitoring renal function and electrolytes Add 4 g Mag today.  Compression fracture of T11 vertebra (HCC) PT and OT evaluation   Depression, major, single episode, moderate (HCC) Continue neuro checks per unit protocol        Subjective: Patient with no chest pain or dyspnea, she is somnolent and fatigued   Physical Exam: Vitals:   07/15/22 0600 07/15/22 0718 07/15/22 1046 07/15/22 1128  BP: 131/75 (!) 134/97 109/66 117/68  Pulse: (!) 55 60 (!) 44 (!) 45  Resp: '16 20 20 '$ (!) 24  Temp:  98 F (36.7 C) 98 F (36.7 C)   TempSrc:  Oral Oral   SpO2: (!) 82% 97% 96% 97%  Weight:      Height:       Neurology somnolent but easy to arouse, responds to questions and follows commands ENT with mild pallor Cardiovascular with S1 and S2 present irregular and bradycardic with no murmurs or gallops. Respiratory with no rales or wheezing, no rhonchi Abdomen with no distention  Data Reviewed:    Family Communication: I spoke with patient's daughter at the bedside, we talked in detail about patient's condition, plan of care and prognosis and all questions were addressed.  Disposition: Status is: Observation The patient will require care spanning > 2 midnights and should be moved to inpatient because: Inpatient due to bradycardia   Planned Discharge Destination: Home      Author: Tawni Millers, MD 07/15/2022 2:27 PM  For on call review [email or web address].

## 2022-07-15 NOTE — Progress Notes (Signed)
ANTICOAGULATION CONSULT NOTE - Follow-up Consult  Pharmacy Consult for Coumadin Indication: atrial fibrillation  Allergies  Allergen Reactions   Amlodipine Besylate     Other reaction(s): high dose make feet swell   Codeine Nausea And Vomiting   Meperidine Nausea Only   Metoprolol Succinate [Metoprolol]     Other reaction(s): effects her breathing   Oxycodone Nausea And Vomiting    Patient Measurements: Height: '5\' 3"'$  (160 cm) Weight: 62.2 kg (137 lb 2 oz) IBW/kg (Calculated) : 52.4  Vital Signs: Temp: 98 F (36.7 C) (03/09 0718) Temp Source: Oral (03/09 0718) BP: 134/97 (03/09 0718) Pulse Rate: 60 (03/09 0718)  Labs: Recent Labs    07/13/22 1845 07/13/22 2030 07/14/22 0329 07/15/22 0026  HGB 12.4  --  12.2  --   HCT 39.5  --  37.2  --   PLT 204  --  179  --   LABPROT 36.5*  --  32.8* 30.3*  INR 3.7*  --  3.2* 2.9*  CREATININE 0.91  --  0.85 1.02*  TROPONINIHS 19* 21*  --   --      Estimated Creatinine Clearance: 29.1 mL/min (A) (by C-G formula based on SCr of 1.02 mg/dL (H)).   Medical History: Past Medical History:  Diagnosis Date   Acute CHF (congestive heart failure) ([place]) 07/13/2022   Hypertension     Medications:  No current facility-administered medications on file prior to encounter.   Current Outpatient Medications on File Prior to Encounter  Medication Sig Dispense Refill   amLODipine (NORVASC) 5 MG tablet Take 5 mg by mouth daily.     dorzolamide (TRUSOPT) 2 % ophthalmic solution Place 1 drop into both eyes 2 (two) times daily.      hydrALAZINE (APRESOLINE) 25 MG tablet Take 25 mg by mouth in the morning and at bedtime.     latanoprost (XALATAN) 0.005 % ophthalmic solution Place 1 drop into both eyes at bedtime. 2.5 mL 0   rOPINIRole (REQUIP) 0.25 MG tablet Take 1 tablet (0.25 mg total) by mouth daily as needed (restless legs around 10am - home dose). (Patient taking differently: Take 0.25 mg by mouth 4 (four) times daily as needed (restless  leg). Taken 4pm and 8pm and an additional dose in the middle of the night and possibly in the morning.) 30 tablet 0   warfarin (COUMADIN) 1 MG tablet Take 1 tablet (1 mg total) by mouth every Tuesday, Thursday, and Saturday at 6 PM. On Tues, Thurs, Sat (Patient taking differently: Take 1-1.5 mg by mouth See admin instructions. Take 1 tablet by mouth daily at 6pm on Mon, Wed, Fri, Sat. Take 1.'5mg'$  daily at 6pm on Sun, Tues, Thurs) 15 tablet 0   acetaminophen (TYLENOL) 500 MG tablet Take 650 mg by mouth every 6 (six) hours as needed. (Patient not taking: Reported on 07/14/2022)     DULoxetine (CYMBALTA) 30 MG capsule Take 1 capsule (30 mg total) by mouth daily. (Patient not taking: Reported on 07/14/2022) 30 capsule 0   losartan (COZAAR) 50 MG tablet Take 1 tablet (50 mg total) by mouth daily. (Patient not taking: Reported on 07/14/2022) 30 tablet 0     Assessment: 89 y.o. female admitted with PNA and has a history of AFib on warfarin prior to admission. Pt reports taking warfarin '1mg'$  daily on Monday, Wednesday, Friday, and Saturday and 1.'5mg'$  daily on Sunday, Tuesday, and Thursdays. Last prescribed regimen was 1.5 mg daily and 3 mg on Tuesdays. Last dose PTA was 07/12/22 at 1800.  Pharmacy consulted to dose warfarin.  No CBC ordered today, will add.  INR trending down from 3.2 to 2.8, likely not reflective of most recent dose.  No s/sx of bleeding  Goal of Therapy:  INR 2-3 Monitor platelets by anticoagulation protocol: Yes   Plan:  Warfarin 0.'5mg'$  PO x1 again tonight. Can likely increase 3/10. Monitor daily INR and for s/sx of bleeding  Thank you for involving pharmacy in this patient's care.  Reatha Harps, PharmD PGY2 Pharmacy Resident 07/15/2022 7:47 AM   Please refer to AMION for pharmacy phone number

## 2022-07-16 DIAGNOSIS — I10 Essential (primary) hypertension: Secondary | ICD-10-CM | POA: Diagnosis not present

## 2022-07-16 DIAGNOSIS — I5033 Acute on chronic diastolic (congestive) heart failure: Secondary | ICD-10-CM | POA: Diagnosis not present

## 2022-07-16 DIAGNOSIS — S22080D Wedge compression fracture of T11-T12 vertebra, subsequent encounter for fracture with routine healing: Secondary | ICD-10-CM | POA: Diagnosis not present

## 2022-07-16 DIAGNOSIS — I4821 Permanent atrial fibrillation: Secondary | ICD-10-CM | POA: Diagnosis not present

## 2022-07-16 DIAGNOSIS — E876 Hypokalemia: Secondary | ICD-10-CM | POA: Diagnosis not present

## 2022-07-16 DIAGNOSIS — N179 Acute kidney failure, unspecified: Secondary | ICD-10-CM | POA: Diagnosis not present

## 2022-07-16 DIAGNOSIS — I5021 Acute systolic (congestive) heart failure: Secondary | ICD-10-CM | POA: Diagnosis not present

## 2022-07-16 LAB — CBC
HCT: 38.6 % (ref 36.0–46.0)
Hemoglobin: 11.8 g/dL — ABNORMAL LOW (ref 12.0–15.0)
MCH: 27.1 pg (ref 26.0–34.0)
MCHC: 30.6 g/dL (ref 30.0–36.0)
MCV: 88.7 fL (ref 80.0–100.0)
Platelets: 221 10*3/uL (ref 150–400)
RBC: 4.35 MIL/uL (ref 3.87–5.11)
RDW: 17.1 % — ABNORMAL HIGH (ref 11.5–15.5)
WBC: 7 10*3/uL (ref 4.0–10.5)
nRBC: 0 % (ref 0.0–0.2)

## 2022-07-16 LAB — BASIC METABOLIC PANEL
Anion gap: 5 (ref 5–15)
BUN: 26 mg/dL — ABNORMAL HIGH (ref 8–23)
CO2: 31 mmol/L (ref 22–32)
Calcium: 8.7 mg/dL — ABNORMAL LOW (ref 8.9–10.3)
Chloride: 102 mmol/L (ref 98–111)
Creatinine, Ser: 1.25 mg/dL — ABNORMAL HIGH (ref 0.44–1.00)
GFR, Estimated: 40 mL/min — ABNORMAL LOW (ref >60)
Glucose, Bld: 113 mg/dL — ABNORMAL HIGH (ref 70–99)
Potassium: 4.1 mmol/L (ref 3.5–5.1)
Sodium: 138 mmol/L (ref 135–145)

## 2022-07-16 LAB — PROTIME-INR
INR: 3.5 — ABNORMAL HIGH (ref 0.8–1.2)
Prothrombin Time: 34.7 seconds — ABNORMAL HIGH (ref 11.4–15.2)

## 2022-07-16 LAB — MAGNESIUM: Magnesium: 2.5 mg/dL — ABNORMAL HIGH (ref 1.7–2.4)

## 2022-07-16 MED ORDER — FUROSEMIDE 10 MG/ML IJ SOLN
60.0000 mg | Freq: Two times a day (BID) | INTRAMUSCULAR | Status: DC
  Administered 2022-07-16 – 2022-07-18 (×5): 60 mg via INTRAVENOUS
  Filled 2022-07-16 (×5): qty 6

## 2022-07-16 NOTE — Progress Notes (Signed)
ANTICOAGULATION CONSULT NOTE - Follow-up Consult  Pharmacy Consult for Coumadin Indication: atrial fibrillation  Allergies  Allergen Reactions   Amlodipine Besylate     Other reaction(s): high dose make feet swell   Codeine Nausea And Vomiting   Meperidine Nausea Only   Metoprolol Succinate [Metoprolol]     Other reaction(s): effects her breathing   Oxycodone Nausea And Vomiting    Patient Measurements: Height: '5\' 3"'$  (160 cm) Weight: 64.6 kg (142 lb 6.7 oz) IBW/kg (Calculated) : 52.4  Vital Signs: Temp: 98 F (36.7 C) (03/10 0740) Temp Source: Oral (03/10 0740) BP: 129/69 (03/10 0740) Pulse Rate: 54 (03/10 0740)  Labs: Recent Labs    07/13/22 1845 07/13/22 2030 07/14/22 0329 07/15/22 0026 07/16/22 0017  HGB 12.4  --  12.2 12.0 11.8*  HCT 39.5  --  37.2 36.7 38.6  PLT 204  --  179 212 221  LABPROT 36.5*  --  32.8* 30.3* 34.7*  INR 3.7*  --  3.2* 2.9* 3.5*  CREATININE 0.91  --  0.85 1.02* 1.25*  TROPONINIHS 19* 21*  --   --   --      Estimated Creatinine Clearance: 26 mL/min (A) (by C-G formula based on SCr of 1.25 mg/dL (H)).   Medical History: Past Medical History:  Diagnosis Date   Acute CHF (congestive heart failure) ([place]) 07/13/2022   Hypertension     Medications:  No current facility-administered medications on file prior to encounter.   Current Outpatient Medications on File Prior to Encounter  Medication Sig Dispense Refill   amLODipine (NORVASC) 5 MG tablet Take 5 mg by mouth daily.     dorzolamide (TRUSOPT) 2 % ophthalmic solution Place 1 drop into both eyes 2 (two) times daily.      hydrALAZINE (APRESOLINE) 25 MG tablet Take 25 mg by mouth in the morning and at bedtime.     latanoprost (XALATAN) 0.005 % ophthalmic solution Place 1 drop into both eyes at bedtime. 2.5 mL 0   rOPINIRole (REQUIP) 0.25 MG tablet Take 1 tablet (0.25 mg total) by mouth daily as needed (restless legs around 10am - home dose). (Patient taking differently: Take 0.25 mg by  mouth 4 (four) times daily as needed (restless leg). Taken 4pm and 8pm and an additional dose in the middle of the night and possibly in the morning.) 30 tablet 0   warfarin (COUMADIN) 1 MG tablet Take 1 tablet (1 mg total) by mouth every Tuesday, Thursday, and Saturday at 6 PM. On Tues, Thurs, Sat (Patient taking differently: Take 1-1.5 mg by mouth See admin instructions. Take 1 tablet by mouth daily at 6pm on Mon, Wed, Fri, Sat. Take 1.'5mg'$  daily at 6pm on Sun, Tues, Thurs) 15 tablet 0   acetaminophen (TYLENOL) 500 MG tablet Take 650 mg by mouth every 6 (six) hours as needed. (Patient not taking: Reported on 07/14/2022)     DULoxetine (CYMBALTA) 30 MG capsule Take 1 capsule (30 mg total) by mouth daily. (Patient not taking: Reported on 07/14/2022) 30 capsule 0   losartan (COZAAR) 50 MG tablet Take 1 tablet (50 mg total) by mouth daily. (Patient not taking: Reported on 07/14/2022) 30 tablet 0     Assessment: 89 y.o. female admitted with PNA and has a history of AFib on warfarin prior to admission. Pt reports taking warfarin '1mg'$  daily on Monday, Wednesday, Friday, and Saturday and 1.'5mg'$  daily on Sunday, Tuesday, and Thursdays. Last prescribed regimen was 1.5 mg daily and 3 mg on Tuesdays. Last dose  PTA was 07/12/22 at 1800. Pharmacy consulted to dose warfarin. CBC stable.  INR trending up significantly from 2.9 to 3.5 after reduced dose. Unable to reduce warfarin less than 0.5 mg. Will hold dose tonight. No s/sx of bleeding.  Goal of Therapy:  INR 2-3 Monitor platelets by anticoagulation protocol: Yes   Plan:  Hold warfarin tonight  Monitor daily INR and for s/sx of bleeding  Thank you for involving pharmacy in this patient's care.  Reatha Harps, PharmD PGY2 Pharmacy Resident 07/16/2022 8:36 AM

## 2022-07-16 NOTE — Progress Notes (Signed)
Cardiology Progress Note  Patient ID: Danielle Thomas MRN: [id number] DOB: 25-Feb-1928 Date of Encounter: 07/16/2022  Primary Cardiologist: Evalina Field, MD  Subjective   Chief Complaint: SOB  HPI: Still volume up.  Slight rise in creatinine but okay for diuresis.  A-fib with SVR noted.  ROS:  All other ROS reviewed and negative. Pertinent positives noted in the HPI.     Inpatient Medications  Scheduled Meds:  DULoxetine  30 mg Oral Daily   furosemide  60 mg Intravenous BID   rOPINIRole  0.5 mg Oral BID   sodium chloride flush  3 mL Intravenous Q12H   spironolactone  12.5 mg Oral Daily   Warfarin - Pharmacist Dosing Inpatient   Does not apply q1600   Continuous Infusions:  sodium chloride     PRN Meds: sodium chloride, acetaminophen **OR** acetaminophen, melatonin, metoCLOPramide (REGLAN) injection, ondansetron (ZOFRAN) IV, rOPINIRole, senna-docusate, sodium chloride flush   Vital Signs   Vitals:   07/16/22 0800 07/16/22 0847 07/16/22 0900 07/16/22 1000  BP: 124/65  (!) 141/69 122/67  Pulse: 61 (!) 56 60 (!) 58  Resp: '20 20 19 20  '$ Temp:      TempSrc:      SpO2: 96% 95% 96% 93%  Weight:      Height:        Intake/Output Summary (Last 24 hours) at 07/16/2022 1027 Last data filed at 07/16/2022 0900 Gross per 24 hour  Intake 580 ml  Output 600 ml  Net -20 ml      07/16/2022    3:42 AM 07/15/2022    5:54 AM 07/13/2022    8:41 PM  Last 3 Weights  Weight (lbs) 142 lb 6.7 oz 137 lb 2 oz 160 lb 15 oz  Weight (kg) 64.6 kg 62.2 kg 73 kg      Telemetry  Overnight telemetry shows A-fib heart rate 40 to 60 bpm, which I personally reviewed.    Physical Exam   Vitals:   07/16/22 0800 07/16/22 0847 07/16/22 0900 07/16/22 1000  BP: 124/65  (!) 141/69 122/67  Pulse: 61 (!) 56 60 (!) 58  Resp: '20 20 19 20  '$ Temp:      TempSrc:      SpO2: 96% 95% 96% 93%  Weight:      Height:        Intake/Output Summary (Last 24 hours) at 07/16/2022 1027 Last data filed at  07/16/2022 0900 Gross per 24 hour  Intake 580 ml  Output 600 ml  Net -20 ml       07/16/2022    3:42 AM 07/15/2022    5:54 AM 07/13/2022    8:41 PM  Last 3 Weights  Weight (lbs) 142 lb 6.7 oz 137 lb 2 oz 160 lb 15 oz  Weight (kg) 64.6 kg 62.2 kg 73 kg    Body mass index is 25.23 kg/m.  General: Well nourished, well developed, in no acute distress Head: Atraumatic, normal size  Eyes: PEERLA, EOMI  Neck: Supple, JVD 12 to 15 cm of water Endocrine: No thryomegaly Cardiac: Normal S1, S2; irregular rhythm Lungs: Diminished breath sounds bilaterally Abd: Soft, nontender, no hepatomegaly  Ext: No edema, pulses 2+ Musculoskeletal: No deformities, BUE and BLE strength normal and equal Skin: Warm and dry, no rashes   Neuro: Alert and oriented to person, place, time, and situation, CNII-XII grossly intact, no focal deficits  Psych: Normal mood and affect   Labs  High Sensitivity Troponin:   Recent Labs  Lab 07/13/22 1845 07/13/22 2030  TROPONINIHS 19* 21*     Cardiac EnzymesNo results for input(s): "TROPONINI" in the last 168 hours. No results for input(s): "TROPIPOC" in the last 168 hours.  Chemistry Recent Labs  Lab 07/14/22 0329 07/15/22 0026 07/16/22 0017  NA 140 138 138  K 3.1* 3.9 4.1  CL 104 100 102  CO2 '27 27 31  '$ GLUCOSE 105* 114* 113*  BUN 18 22 26*  CREATININE 0.85 1.02* 1.25*  CALCIUM 8.9 9.0 8.7*  GFRNONAA >60 52* 40*  ANIONGAP '9 11 5    '$ Hematology Recent Labs  Lab 07/14/22 0329 07/15/22 0026 07/16/22 0017  WBC 6.8 7.0 7.0  RBC 4.36 4.28 4.35  HGB 12.2 12.0 11.8*  HCT 37.2 36.7 38.6  MCV 85.3 85.7 88.7  MCH 28.0 28.0 27.1  MCHC 32.8 32.7 30.6  RDW 17.2* 17.1* 17.1*  PLT 179 212 221   BNP Recent Labs  Lab 07/13/22 1845  BNP 1,998.9*    DDimer No results for input(s): "DDIMER" in the last 168 hours.   Radiology  DG CHEST PORT 1 VIEW  Result Date: 07/15/2022 CLINICAL DATA:  Shortness of breath EXAM: PORTABLE CHEST 1 VIEW COMPARISON:   07/13/2022 and prior radiographs FINDINGS: The cardiomediastinal silhouette is unchanged. Pulmonary vascular congestion and bibasilar opacities, LEFT-greater-than-RIGHT are not significantly changed. No new pulmonary opacities are noted. There is no evidence of pneumothorax. Surgical clips overlying the LOWER RIGHT hemithorax and spinal fixation hardware again noted. IMPRESSION: Unchanged appearance of the chest with pulmonary vascular congestion and bibasilar opacities/atelectasis. Electronically Signed   By: Margarette Canada M.D.   On: 07/15/2022 16:34   ECHOCARDIOGRAM COMPLETE  Result Date: 07/14/2022    ECHOCARDIOGRAM REPORT   Patient Name:   Danielle Thomas Date of Exam: 07/14/2022 Medical Rec #:  [id number]      Height:       63.0 in Accession #:    [id number]     Weight:       160.9 lb Date of Birth:  Jul 23, 1927      BSA:          1.763 m Patient Age:    12 years       BP:           135/100 mmHg Patient Gender: F              HR:           67 bpm. Exam Location:  Inpatient Procedure: 2D Echo, Cardiac Doppler and Color Doppler Indications:    acute diastolic chf  History:        Patient has prior history of Echocardiogram examinations, most                 recent 09/03/2006. Arrythmias:Atrial Fibrillation,                 Signs/Symptoms:Shortness of Breath and Edema; Risk                 Factors:Hypertension and Dyslipidemia.  Sonographer:    Johny Chess RDCS Referring Phys: Arab  1. Left ventricular ejection fraction, by estimation, is 45 to 50%. The left ventricle has mildly decreased function. The left ventricle demonstrates global hypokinesis. Left ventricular diastolic parameters are indeterminate.  2. Right ventricular systolic function is moderately reduced. The right ventricular size is mildly enlarged. There is severely elevated pulmonary artery systolic pressure. The estimated right ventricular systolic pressure is 99991111 mmHg.  3. Left  atrial size was severely dilated.  4.  Right atrial size was severely dilated.  5. The mitral valve is normal in structure. Mild mitral valve regurgitation. No evidence of mitral stenosis.  6. The aortic valve is tricuspid. There is mild calcification of the aortic valve. Aortic valve regurgitation is trivial. No aortic stenosis is present.  7. The inferior vena cava is dilated in size with <50% respiratory variability, suggesting right atrial pressure of 15 mmHg.  8. The patient was in atrial fibrillation. FINDINGS  Left Ventricle: Left ventricular ejection fraction, by estimation, is 45 to 50%. The left ventricle has mildly decreased function. The left ventricle demonstrates global hypokinesis. The left ventricular internal cavity size was normal in size. There is  no left ventricular hypertrophy. Left ventricular diastolic parameters are indeterminate. Right Ventricle: The right ventricular size is mildly enlarged. No increase in right ventricular wall thickness. Right ventricular systolic function is moderately reduced. There is severely elevated pulmonary artery systolic pressure. The tricuspid regurgitant velocity is 3.57 m/s, and with an assumed right atrial pressure of 15 mmHg, the estimated right ventricular systolic pressure is 99991111 mmHg. Left Atrium: Left atrial size was severely dilated. Right Atrium: Right atrial size was severely dilated. Pericardium: There is no evidence of pericardial effusion. Mitral Valve: The mitral valve is normal in structure. There is mild calcification of the mitral valve leaflet(s). Mild to moderate mitral annular calcification. Mild mitral valve regurgitation. No evidence of mitral valve stenosis. Tricuspid Valve: The tricuspid valve is normal in structure. Tricuspid valve regurgitation is mild. Aortic Valve: The aortic valve is tricuspid. There is mild calcification of the aortic valve. Aortic valve regurgitation is trivial. No aortic stenosis is present. Pulmonic Valve: The pulmonic valve was normal in  structure. Pulmonic valve regurgitation is not visualized. Aorta: The aortic root is normal in size and structure. Venous: The inferior vena cava is dilated in size with less than 50% respiratory variability, suggesting right atrial pressure of 15 mmHg. IAS/Shunts: No atrial level shunt detected by color flow Doppler.  LEFT VENTRICLE PLAX 2D LVIDd:         4.70 cm LVIDs:         3.60 cm LV PW:         0.90 cm LV IVS:        1.25 cm  RIGHT VENTRICLE          IVC RV Basal diam:  3.60 cm  IVC diam: 2.20 cm LEFT ATRIUM              Index        RIGHT ATRIUM           Index LA diam:        4.50 cm  2.55 cm/m   RA Area:     32.40 cm LA Vol (A2C):   109.0 ml 61.82 ml/m  RA Volume:   114.00 ml 64.66 ml/m LA Vol (A4C):   70.3 ml  39.87 ml/m LA Biplane Vol: 91.1 ml  51.67 ml/m   AORTA Ao Root diam: 3.50 cm Ao Asc diam:  3.40 cm MITRAL VALVE               TRICUSPID VALVE MV Area (PHT): 4.71 cm    TR Peak grad:   51.0 mmHg MV Decel Time: 161 msec    TR Vmax:        357.00 cm/s MV E velocity: 72.20 cm/s MV A velocity: 24.50 cm/s MV E/A ratio:  2.95 Dalton  McleanMD Electronically signed by Franki Monte Signature Date/Time: 07/14/2022/3:41:59 PM    Final     Cardiac Studies  TTE 07/14/2022  1. Left ventricular ejection fraction, by estimation, is 45 to 50%. The  left ventricle has mildly decreased function. The left ventricle  demonstrates global hypokinesis. Left ventricular diastolic parameters are  indeterminate.   2. Right ventricular systolic function is moderately reduced. The right  ventricular size is mildly enlarged. There is severely elevated pulmonary  artery systolic pressure. The estimated right ventricular systolic  pressure is 99991111 mmHg.   3. Left atrial size was severely dilated.   4. Right atrial size was severely dilated.   5. The mitral valve is normal in structure. Mild mitral valve  regurgitation. No evidence of mitral stenosis.   6. The aortic valve is tricuspid. There is mild  calcification of the  aortic valve. Aortic valve regurgitation is trivial. No aortic stenosis is  present.   7. The inferior vena cava is dilated in size with <50% respiratory  variability, suggesting right atrial pressure of 15 mmHg.   8. The patient was in atrial fibrillation.   Patient Profile  89 year old female with history of permanent atrial fibrillation, hypertension, stroke who was admitted on 07/13/2022 for acute hypoxic respiratory failure secondary to new onset systolic heart failure.   Assessment & Plan   # Acute hypoxic respiratory failure secondary to pulmonary edema # New onset systolic heart failure, EF 45% # RV dysfunction/pulmonary hypertension -New diagnosis for her.  Given advanced age and medical management has been recommended.  Patient and family are in agreement with this. -Chest x-ray with persistent pulmonary edema. -500 urine output yesterday. -BP stable.  Increase Lasix to 60 mg IV twice daily.  Hold losartan given slight rise in creatinine.  Continue Aldactone.  Can add back GDMT as we are able. -Avoiding AV nodal agents in the setting of A-fib with bradycardia.  # Permanent A-fib # Sick sinus syndrome -Has A-fib with SVR.  Rhythm remains irregular.  Do not suspect complete heart block.  Heart rate will come up when she talks.  No indications for pacing at this time.  Avoid AV nodal agents.  We have decided on a conservative approach given her advanced age. -On Coumadin at home.  INR 3.5 today. -Consider transition to [place] at some point.  Family will think about this.  #AKI -Suspect cardiorenal.  Will continue with diuresis 60 mg IV Lasix twice daily.  Continue Aldactone 12.5 mg daily.  For questions or updates, please contact [place] Please consult [email or web address] for contact info under   Signed, [place] B[place] T. Audie Box, MD, [place]  07/16/2022 10:27 AM

## 2022-07-16 NOTE — Progress Notes (Signed)
Progress Note   Patient: Danielle Thomas [id number] DOB: July 08, 1927 DOA: 07/13/2022     3 DOS: the patient was seen and examined on 07/16/2022   Brief hospital course: [name] was admitted to the hospital with the working diagnosis of heart failure exacerbation.   89 yo female with the past medical history of hypertension, atrial fibrillation, restless leg syndrome, and osteoporosis who presented with dyspnea. Patient with worsening dyspnea for the last 3 days, associated with lower extremity edema. Patient was evaluated by her primary care provider who found her in atrial fibrillation with RVR and heart failure decompensation. She was referred to the ED for further evaluation. On her initial physical examination her blood pressure was 167/124, 174/117, HR 72, RR 29 and 02 saturation 90 to 94%, lungs with no wheezing or rales, no rhonchi, heart with S1 and S2 present, irregularly irregular, with no gallops or murmurs, abdomen with no distention and no lower extremity edema.   Na 139, K 4,3 Cl 106 bicarbonate 25 glucose 101 bun 19 cr 0,91  BNP 1,988  High sensitive troponin 19 and 21  Wbc 6.4 hgb 12,4 plt 204  Sars covid 19 negative   EKG 78 bpm, normal axis, normal intervals, atrial fibrillation with PVC, no significant ST segment or T wave changes.   03/09 patient placed on metoprolol and developed symptomatic bradycardia.  03/10 patient continue diuresis with IV furosemide, heart rate improved off metoprolol.   Assessment and Plan: * Atrial fibrillation [facility]) Telemetry personally reviewed, atrial fibrillation rhythm with rate 55 to 60 bpm.   Avoid AV blockade, possible sick sinus syndrome.  Continue anticoagulation with warfarin, possible transition to DOAC.  Continue telemetry monitoring.    Acute on chronic diastolic CHF (congestive heart failure) (HCC) Echocardiogram with mild reduction in systolic function with EF 45 to 50%, with global hypokinesis, moderate reduction in RV  systolic function, RVSP 66 mmHg, LA and RA with severe dilatation, no significant valvular disease.   Urine output is documented at [id number] cc Systolic blood pressure has been   Continue diuresis with furosemide and spironolactone. Hold on losartan for now due to elevated serum cr.   Essential hypertension Hypertensive emergency.   Continue diuresis with furosemide and spironolactone. Hold on losartan for now.   Hypokalemia AKI, Hypomagnesemia.   Renal function with serum cr at 1.25 with K at 4,1 and serum bicarbonate at 31. Na 138 Mg 2,5   Plan to continue diuresis with furosemide and spironolactone Follow up renal function in am.   Compression fracture of T11 vertebra (HCC) PT and OT evaluation   Depression, major, single episode, moderate (HCC) Continue neuro checks per unit protocol        Subjective: Patient with no chest pain, dyspnea and edema are improving, she has been somnolent and weak/.   Physical Exam: Vitals:   07/16/22 1000 07/16/22 1100 07/16/22 1200 07/16/22 1300  BP: 122/67 129/64 107/75 123/64  Pulse: (!) 58 (!) 50 (!) 57 (!) 57  Resp: 20 19 (!) 21 20  Temp:  98 F (36.7 C)    TempSrc:  Oral    SpO2: 93% 96% 94% 96%  Weight:      Height:       Neurology somnolent but easy to arouse, no confusion or agitation, follows commands and responds to questions ENT with mild pallor Cardiovascular with S1 and S2 present, bradycardic, irregularly irregular with systolic murmur at the apex, no rubs or gallops.  No JVD No lower extremity edema  Respiratory with mild rales at bases with no wheezing Abdomen with no distention  Data Reviewed:    Family Communication: I spoke with patient's sister at the bedside, we talked in detail about patient's condition, plan of care and prognosis and all questions were addressed.   Disposition: Status is: Inpatient Remains inpatient appropriate because: heart failure   Planned Discharge Destination:  Home      Author: Tawni Millers, MD 07/16/2022 2:33 PM  For on call review [email or web address].

## 2022-07-17 DIAGNOSIS — I5033 Acute on chronic diastolic (congestive) heart failure: Secondary | ICD-10-CM | POA: Diagnosis not present

## 2022-07-17 DIAGNOSIS — I480 Paroxysmal atrial fibrillation: Secondary | ICD-10-CM

## 2022-07-17 DIAGNOSIS — E876 Hypokalemia: Secondary | ICD-10-CM | POA: Diagnosis not present

## 2022-07-17 DIAGNOSIS — I4811 Longstanding persistent atrial fibrillation: Secondary | ICD-10-CM | POA: Diagnosis not present

## 2022-07-17 DIAGNOSIS — I10 Essential (primary) hypertension: Secondary | ICD-10-CM | POA: Diagnosis not present

## 2022-07-17 LAB — BASIC METABOLIC PANEL
Anion gap: 10 (ref 5–15)
BUN: 26 mg/dL — ABNORMAL HIGH (ref 8–23)
CO2: 33 mmol/L — ABNORMAL HIGH (ref 22–32)
Calcium: 8.8 mg/dL — ABNORMAL LOW (ref 8.9–10.3)
Chloride: 96 mmol/L — ABNORMAL LOW (ref 98–111)
Creatinine, Ser: 1.02 mg/dL — ABNORMAL HIGH (ref 0.44–1.00)
GFR, Estimated: 52 mL/min — ABNORMAL LOW (ref >60)
Glucose, Bld: 125 mg/dL — ABNORMAL HIGH (ref 70–99)
Potassium: 4.1 mmol/L (ref 3.5–5.1)
Sodium: 139 mmol/L (ref 135–145)

## 2022-07-17 LAB — CBC
HCT: 38 % (ref 36.0–46.0)
Hemoglobin: 12.1 g/dL (ref 12.0–15.0)
MCH: 28.1 pg (ref 26.0–34.0)
MCHC: 31.8 g/dL (ref 30.0–36.0)
MCV: 88.2 fL (ref 80.0–100.0)
Platelets: 188 10*3/uL (ref 150–400)
RBC: 4.31 MIL/uL (ref 3.87–5.11)
RDW: 16.5 % — ABNORMAL HIGH (ref 11.5–15.5)
WBC: 9.6 10*3/uL (ref 4.0–10.5)
nRBC: 0 % (ref 0.0–0.2)

## 2022-07-17 LAB — PROTIME-INR
INR: 3.2 — ABNORMAL HIGH (ref 0.8–1.2)
Prothrombin Time: 32.4 seconds — ABNORMAL HIGH (ref 11.4–15.2)

## 2022-07-17 MED ORDER — DORZOLAMIDE HCL 2 % OP SOLN
1.0000 [drp] | Freq: Two times a day (BID) | OPHTHALMIC | Status: DC
  Administered 2022-07-17 – 2022-07-20 (×6): 1 [drp] via OPHTHALMIC
  Filled 2022-07-17: qty 10

## 2022-07-17 MED ORDER — LATANOPROST 0.005 % OP SOLN
1.0000 [drp] | Freq: Every day | OPHTHALMIC | Status: DC
  Administered 2022-07-17 – 2022-07-19 (×3): 1 [drp] via OPHTHALMIC
  Filled 2022-07-17: qty 2.5

## 2022-07-17 NOTE — Progress Notes (Signed)
Mobility Specialist Progress Note:   07/17/22 1620  Mobility  Activity Ambulated with assistance in hallway  Level of Assistance Contact guard assist, steadying assist  Assistive Device Front wheel walker  Distance Ambulated (ft) 100 ft  Activity Response Tolerated well  Mobility Referral Yes  $Mobility charge 1 Mobility   Pt agreeable to mobility session. Required only minG during ambulation. 3LO2 required to maintain SpO2 WFL. Pt back in bed with all needs met.   Nelta Numbers Mobility Specialist Please contact via SecureChat or  Rehab office at [phone]

## 2022-07-17 NOTE — Progress Notes (Signed)
SATURATION QUALIFICATIONS: (This note is used to comply with regulatory documentation for home oxygen)  Patient Saturations on Room Air at Rest = 86%  Patient Saturations on Room Air while Ambulating = N/A%  Patient Saturations on 2 Liters of oxygen while Ambulating = 92%  Please briefly explain why patient needs home oxygen: Pt hypoxic on RA at rest, needs 2L O2 [place] to maintain 92% and greater with resting and exertional tasks.

## 2022-07-17 NOTE — Progress Notes (Signed)
ANTICOAGULATION CONSULT NOTE - Follow-up Consult  Pharmacy Consult for Transition Coumadin to Apixaban Indication: atrial fibrillation  Allergies  Allergen Reactions   Amlodipine Besylate     Other reaction(s): high dose make feet swell   Codeine Nausea And Vomiting   Meperidine Nausea Only   Metoprolol Succinate [Metoprolol]     Other reaction(s): effects her breathing   Oxycodone Nausea And Vomiting    Patient Measurements: Height: '5\' 3"'$  (160 cm) Weight: 60 kg (132 lb 4.4 oz) IBW/kg (Calculated) : 52.4  Vital Signs: Temp: 97.8 F (36.6 C) (03/11 0715) Temp Source: Oral (03/11 0715) BP: 119/55 (03/11 0715) Pulse Rate: 69 (03/11 0441)  Labs: Recent Labs    07/15/22 0026 07/16/22 0017 07/17/22 0021  HGB 12.0 11.8* 12.1  HCT 36.7 38.6 38.0  PLT 212 221 188  LABPROT 30.3* 34.7* 32.4*  INR 2.9* 3.5* 3.2*  CREATININE 1.02* 1.25* 1.02*    Estimated Creatinine Clearance: 29.1 mL/min (A) (by C-G formula based on SCr of 1.02 mg/dL (H)).   Medical History: Past Medical History:  Diagnosis Date   Acute CHF (congestive heart failure) ([place]) 07/13/2022   Hypertension     Medications:  No current facility-administered medications on file prior to encounter.   Current Outpatient Medications on File Prior to Encounter  Medication Sig Dispense Refill   amLODipine (NORVASC) 5 MG tablet Take 5 mg by mouth daily.     dorzolamide (TRUSOPT) 2 % ophthalmic solution Place 1 drop into both eyes 2 (two) times daily.      hydrALAZINE (APRESOLINE) 25 MG tablet Take 25 mg by mouth in the morning and at bedtime.     latanoprost (XALATAN) 0.005 % ophthalmic solution Place 1 drop into both eyes at bedtime. 2.5 mL 0   rOPINIRole (REQUIP) 0.25 MG tablet Take 1 tablet (0.25 mg total) by mouth daily as needed (restless legs around 10am - home dose). (Patient taking differently: Take 0.25 mg by mouth 4 (four) times daily as needed (restless leg). Taken 4pm and 8pm and an additional dose in the  middle of the night and possibly in the morning.) 30 tablet 0   warfarin (COUMADIN) 1 MG tablet Take 1 tablet (1 mg total) by mouth every Tuesday, Thursday, and Saturday at 6 PM. On Tues, Thurs, Sat (Patient taking differently: Take 1-1.5 mg by mouth See admin instructions. Take 1 tablet by mouth daily at 6pm on Mon, Wed, Fri, Sat. Take 1.'5mg'$  daily at 6pm on Sun, Tues, Thurs) 15 tablet 0   acetaminophen (TYLENOL) 500 MG tablet Take 650 mg by mouth every 6 (six) hours as needed. (Patient not taking: Reported on 07/14/2022)     DULoxetine (CYMBALTA) 30 MG capsule Take 1 capsule (30 mg total) by mouth daily. (Patient not taking: Reported on 07/14/2022) 30 capsule 0   losartan (COZAAR) 50 MG tablet Take 1 tablet (50 mg total) by mouth daily. (Patient not taking: Reported on 07/14/2022) 30 tablet 0     Assessment: 89 y.o. female admitted with PNA and has a history of AFib on Warfarin prior to admission. Pt reports taking Warfarin '1mg'$  daily on Monday, Wednesday, Friday, and Saturday and 1.'5mg'$  daily on Sunday, Tuesday, and Thursdays. Last prescribed regimen was 1.5 mg daily and 3 mg on Tuesdays. Last dose PTA was 07/12/22 at 1800. Pharmacy initially consulted to dose Warfarin and now to transition to Apixaban.   INR 3.2 today- above therapeutic goal but trending down after holding dose yesterday (3/10) and receiving lower doses on 3/8  and 3/9. CBC is stable. No s/sx of bleeding. PO intake recorded as 50-75%.   Goal of Therapy:  Start Apixaban when INR <2 Monitor platelets by anticoagulation protocol: Yes   Plan:  Hold anticoagulation due to INR 3.2. Monitor daily INR and start Apixaban therapy when INR <2 per protocol.  Monitor for signs or symptoms of bleeidng.   Thank you for involving pharmacy in this patient's care.  Sloan Leiter, PharmD, BCPS, BCCCP Clinical Pharmacist Please refer to [facility] for [place] numbers 07/17/2022 10:28 AM

## 2022-07-17 NOTE — Evaluation (Signed)
Occupational Therapy Evaluation Patient Details Name: Danielle Thomas MRN: [id number] DOB: [birth year] Today's Date: 07/17/2022   History of Present Illness 89 y.o. female presents to [facility] hospital on 07/13/2022 with SOB and LE edema. Pt admitted for management of acute CHF and afib. PMH includes HTN, afib, RLS, osteoporosis, compression fx.   Clinical Impression   PTA, pt lives alone and typically Modified Independent with ADLs and mobility using walker vs cane. Per chart, pt also active with [facility] services. Pt presents with deficits in strength, dynamic standing balance and cardiopulmonary endurance. Pt requires min guard for mobility using RW w/ some difficulty manuevering in small spaces. Pt requires Min-Mod A for ADLs d/t deficits. Pt also appears reliant on supplemental O2 to maintain sats though inconsistent pleth during session. Pending continued progress during this admission, recommend HHOT and increased family support initially at discharge if possible.      Recommendations for follow up therapy are one component of a multi-disciplinary discharge planning process, led by the attending physician.  Recommendations may be updated based on patient status, additional functional criteria and insurance authorization.   Follow Up Recommendations  Home health OT     Assistance Recommended at Discharge Frequent or constant Supervision/Assistance (initially)  Patient can return home with the following A little help with bathing/dressing/bathroom;Assistance with cooking/housework;Assist for transportation;Direct supervision/assist for medications management    Functional Status Assessment  Patient has had a recent decline in their functional status and demonstrates the ability to make significant improvements in function in a reasonable and predictable amount of time.  Equipment Recommendations  None recommended by OT    Recommendations for Other Services       Precautions / Restrictions  Precautions Precautions: Fall Precaution Comments: monitor sats (reports not using O2 at baseline) Restrictions Weight Bearing Restrictions: No      Mobility Bed Mobility Overal bed mobility: Needs Assistance Bed Mobility: Sit to Supine       Sit to supine: Supervision        Transfers Overall transfer level: Needs assistance Equipment used: Rolling walker (2 wheels) Transfers: Sit to/from Stand, Bed to chair/wheelchair/BSC Sit to Stand: Supervision     Step pivot transfers: Min guard     General transfer comment: able to stand from recliner multiple bouts with RW for ADLs and bathroom mobility. at end of session, pt reported that she wanted to get back to bed so able to pivot from recliner to bed with min guard      Balance Overall balance assessment: Needs assistance Sitting-balance support: No upper extremity supported, Feet supported Sitting balance-Leahy Scale: Good     Standing balance support: Bilateral upper extremity supported, During functional activity Standing balance-Leahy Scale: Poor Standing balance comment: reliant on at least one UE support in standing                           ADL either performed or assessed with clinical judgement   ADL Overall ADL's : Needs assistance/impaired Eating/Feeding: Set up   Grooming: Min guard;Standing   Upper Body Bathing: Minimal assistance;Sitting   Lower Body Bathing: Moderate assistance;Sitting/lateral leans   Upper Body Dressing : Minimal assistance   Lower Body Dressing: Moderate assistance;Sit to/from stand;Sitting/lateral leans Lower Body Dressing Details (indicate cue type and reason): assisting with attempt to don wrap around briefs (though briefs in room were too small) so opted for mesh underwear w/ pad and purewick. Toilet Transfer: Min guard;Ambulation;Rolling walker (2 wheels) Toilet  Transfer Details (indicate cue type and reason): some difficulty navigating RW in small  space Toileting- Clothing Manipulation and Hygiene: Minimal assistance;Sitting/lateral lean;Sit to/from stand Toileting - Clothing Manipulation Details (indicate cue type and reason): able to perform hygiene  seated on toilet with light assist for clothing mgmt d/t lines     Functional mobility during ADLs: Min guard;Rolling walker (2 wheels) General ADL Comments: Pt with questionable desats on RA (denied SOB), also reported not quite feeling like herself     Vision Ability to See in Adequate Light: 0 Adequate Patient Visual Report: No change from baseline Vision Assessment?: No apparent visual deficits     Perception     Praxis      Pertinent Vitals/Pain Pain Assessment Pain Assessment: No/denies pain     Hand Dominance Right   Extremity/Trunk Assessment Upper Extremity Assessment Upper Extremity Assessment: Generalized weakness   Lower Extremity Assessment Lower Extremity Assessment: Defer to PT evaluation   Cervical / Trunk Assessment Cervical / Trunk Assessment: Kyphotic (hx of t11 compression fx)   Communication Communication Communication: HOH   Cognition Arousal/Alertness: Awake/alert Behavior During Therapy: WFL for tasks assessed/performed Overall Cognitive Status: Difficult to assess                                 General Comments: difficult to fully assess d/t HOH and requiring repetition of cues. pt does need cues for sequencing and problem solving though functional     General Comments  questionable desats to 85% on RA for short trip to bathroom as pt reports not typically using O2. replaced with 3 L O2 and SpO2 97%    Exercises     Shoulder Instructions      Home Living Family/patient expects to be discharged to:: Private residence Living Arrangements: Alone Available Help at Discharge: Family;Available PRN/intermittently Type of Home: House Home Access: Stairs to enter [employer] of Steps: 1 Entrance Stairs-Rails:  None Home Layout: Two level;Able to live on main level with bedroom/bathroom Alternate Level Stairs-Number of Steps: 13 Alternate Level Stairs-Rails: Right           Home Equipment: Rollator (4 wheels);Cane - single [employer] (2 wheels)          Prior Functioning/Environment Prior Level of Function : Independent/Modified Independent             Mobility Comments: ambulates with use of rollator, uses cane when ambulating to car ADLs Comments: reports managing ADLs without assist        OT Problem List: Decreased strength;Decreased activity tolerance;Impaired balance (sitting and/or standing);Decreased cognition;Cardiopulmonary status limiting activity      OT Treatment/Interventions: Self-care/ADL training;Therapeutic exercise;Energy conservation;DME and/or AE instruction;Therapeutic activities;Patient/family education;Balance training    OT Goals(Current goals can be found in the care plan section) Acute Rehab OT Goals Patient Stated Goal: feel better soon OT Goal Formulation: With patient Time For Goal Achievement: 07/31/22 Potential to Achieve Goals: Good  OT Frequency: Min 2X/week    Co-evaluation              AM-PAC OT "6 Clicks" Daily Activity     Outcome Measure Help from another person eating meals?: A Little Help from another person taking care of personal grooming?: A Little Help from another person toileting, which includes using toliet, bedpan, or urinal?: A Little Help from another person bathing (including washing, rinsing, drying)?: A Lot Help from another person to put on and taking  off regular upper body clothing?: A Little Help from another person to put on and taking off regular lower body clothing?: A Lot 6 Click Score: 16   End of Session Equipment Utilized During Treatment: Rolling walker (2 wheels);Oxygen Nurse Communication: Mobility status  Activity Tolerance: Patient tolerated treatment well Patient left: in bed;with  call bell/phone within reach;with bed alarm set  OT Visit Diagnosis: Unsteadiness on feet (R26.81);Other abnormalities of gait and mobility (R26.89);Muscle weakness (generalized) (M62.81)                Time: [id number] OT Time Calculation (min): 26 min Charges:  OT General Charges $OT Visit: 1 Visit OT Evaluation $OT Eval Moderate Complexity: 1 Mod OT Treatments $Self Care/Home Management : 8-22 mins  Malachy Chamber, OTR/L Acute Rehab Services Office: [phone]   Layla Maw 07/17/2022, 8:34 AM

## 2022-07-17 NOTE — Progress Notes (Signed)
Rounding Note    Patient Name: Danielle Thomas Date of Encounter: 07/17/2022  [place] Cardiologist: Evalina Field, MD   Subjective   Comfortable. Resting  Inpatient Medications    Scheduled Meds:  DULoxetine  30 mg Oral Daily   furosemide  60 mg Intravenous BID   rOPINIRole  0.5 mg Oral BID   sodium chloride flush  3 mL Intravenous Q12H   spironolactone  12.5 mg Oral Daily   Warfarin - Pharmacist Dosing Inpatient   Does not apply q1600   Continuous Infusions:  sodium chloride     PRN Meds: sodium chloride, acetaminophen **OR** acetaminophen, melatonin, metoCLOPramide (REGLAN) injection, ondansetron (ZOFRAN) IV, rOPINIRole, senna-docusate, sodium chloride flush   Vital Signs    Vitals:   07/17/22 0002 07/17/22 0339 07/17/22 0441 07/17/22 0715  BP: (!) 145/77 (!) 149/72  (!) 119/55  Pulse: 64 63 69   Resp: (!) 21 (!) 23 (!) 24   Temp: 98.2 F (36.8 C) 98.4 F (36.9 C)  97.8 F (36.6 C)  TempSrc: Oral Oral  Oral  SpO2: 98% 96% 95%   Weight:   60 kg   Height:        Intake/Output Summary (Last 24 hours) at 07/17/2022 0919 Last data filed at 07/17/2022 0600 Gross per 24 hour  Intake 420 ml  Output 1250 ml  Net -830 ml      07/17/2022    4:41 AM 07/16/2022    3:42 AM 07/15/2022    5:54 AM  Last 3 Weights  Weight (lbs) 132 lb 4.4 oz 142 lb 6.7 oz 137 lb 2 oz  Weight (kg) 60 kg 64.6 kg 62.2 kg      Telemetry    AFIB, 50- 70 - Personally Reviewed  ECG    No new - Personally Reviewed  Physical Exam   GEN: No acute distress.  Elderly Neck: No JVD Cardiac: Irreg irreg, no murmurs, rubs, or gallops.  Respiratory: Clear to auscultation bilaterally. GI: Soft, nontender, non-distended  MS: No edema; No deformity. Neuro:  Nonfocal  Psych: Normal affect   Labs    High Sensitivity Troponin:   Recent Labs  Lab 07/13/22 1845 07/13/22 2030  TROPONINIHS 19* 21*     Chemistry Recent Labs  Lab 07/14/22 0329 07/15/22 0026 07/16/22 0017  07/17/22 0021  NA 140 138 138 139  K 3.1* 3.9 4.1 4.1  CL 104 100 102 96*  CO2 '27 27 31 '$ 33*  GLUCOSE 105* 114* 113* 125*  BUN 18 22 26* 26*  CREATININE 0.85 1.02* 1.25* 1.02*  CALCIUM 8.9 9.0 8.7* 8.8*  MG 1.8 1.6* 2.5*  --   GFRNONAA >60 52* 40* 52*  ANIONGAP '9 11 5 10    '$ Lipids No results for input(s): "CHOL", "TRIG", "HDL", "LABVLDL", "LDLCALC", "CHOLHDL" in the last 168 hours.  Hematology Recent Labs  Lab 07/15/22 0026 07/16/22 0017 07/17/22 0021  WBC 7.0 7.0 9.6  RBC 4.28 4.35 4.31  HGB 12.0 11.8* 12.1  HCT 36.7 38.6 38.0  MCV 85.7 88.7 88.2  MCH 28.0 27.1 28.1  MCHC 32.7 30.6 31.8  RDW 17.1* 17.1* 16.5*  PLT 212 221 188   Thyroid No results for input(s): "TSH", "FREET4" in the last 168 hours.  BNP Recent Labs  Lab 07/13/22 1845  BNP 1,998.9*    DDimer No results for input(s): "DDIMER" in the last 168 hours.   Radiology    DG CHEST PORT 1 VIEW  Result Date: 07/15/2022 CLINICAL DATA:  Shortness of breath EXAM: PORTABLE CHEST 1 VIEW COMPARISON:  07/13/2022 and prior radiographs FINDINGS: The cardiomediastinal silhouette is unchanged. Pulmonary vascular congestion and bibasilar opacities, LEFT-greater-than-RIGHT are not significantly changed. No new pulmonary opacities are noted. There is no evidence of pneumothorax. Surgical clips overlying the LOWER RIGHT hemithorax and spinal fixation hardware again noted. IMPRESSION: Unchanged appearance of the chest with pulmonary vascular congestion and bibasilar opacities/atelectasis. Electronically Signed   By: Margarette Canada M.D.   On: 07/15/2022 16:34    Cardiac Studies   Cardiac Studies & Procedures     STRESS TESTS  MYOCARDIAL PERFUSION IMAGING 07/07/2015  Narrative  Nuclear stress EF: 50%.  There was no ST segment deviation noted during stress.  No T wave inversion was noted during stress.  The study is normal.  This is a low risk study.  The left ventricular ejection fraction is mildly decreased (45-54%).    ECHOCARDIOGRAM  ECHOCARDIOGRAM COMPLETE 07/14/2022  Narrative ECHOCARDIOGRAM REPORT    Patient Name:   Danielle Thomas Date of Exam: 07/14/2022 Medical Rec #:  [id number]      Height:       63.0 in Accession #:    [id number]     Weight:       160.9 lb Date of Birth:  [birth year]      BSA:          1.763 m Patient Age:    89 years       BP:           135/100 mmHg Patient Gender: F              HR:           67 bpm. Exam Location:  Inpatient  Procedure: 2D Echo, Cardiac Doppler and Color Doppler  Indications:    acute diastolic chf  History:        Patient has prior history of Echocardiogram examinations, most recent 09/03/2006. Arrythmias:Atrial Fibrillation, Signs/Symptoms:Shortness of Breath and Edema; Risk Factors:Hypertension and Dyslipidemia.  Sonographer:    Johny Chess RDCS Referring Phys: [place]   1. Left ventricular ejection fraction, by estimation, is 45 to 50%. The left ventricle has mildly decreased function. The left ventricle demonstrates global hypokinesis. Left ventricular diastolic parameters are indeterminate. 2. Right ventricular systolic function is moderately reduced. The right ventricular size is mildly enlarged. There is severely elevated pulmonary artery systolic pressure. The estimated right ventricular systolic pressure is 99991111 mmHg. 3. Left atrial size was severely dilated. 4. Right atrial size was severely dilated. 5. The mitral valve is normal in structure. Mild mitral valve regurgitation. No evidence of mitral stenosis. 6. The aortic valve is tricuspid. There is mild calcification of the aortic valve. Aortic valve regurgitation is trivial. No aortic stenosis is present. 7. The inferior vena cava is dilated in size with <50% respiratory variability, suggesting right atrial pressure of 15 mmHg. 8. The patient was in atrial fibrillation.  FINDINGS Left Ventricle: Left ventricular ejection fraction, by estimation, is 45 to  50%. The left ventricle has mildly decreased function. The left ventricle demonstrates global hypokinesis. The left ventricular internal cavity size was normal in size. There is no left ventricular hypertrophy. Left ventricular diastolic parameters are indeterminate.  Right Ventricle: The right ventricular size is mildly enlarged. No increase in right ventricular wall thickness. Right ventricular systolic function is moderately reduced. There is severely elevated pulmonary artery systolic pressure. The tricuspid regurgitant velocity is 3.57 m/s, and with an assumed right  atrial pressure of 15 mmHg, the estimated right ventricular systolic pressure is 99991111 mmHg.  Left Atrium: Left atrial size was severely dilated.  Right Atrium: Right atrial size was severely dilated.  Pericardium: There is no evidence of pericardial effusion.  Mitral Valve: The mitral valve is normal in structure. There is mild calcification of the mitral valve leaflet(s). Mild to moderate mitral annular calcification. Mild mitral valve regurgitation. No evidence of mitral valve stenosis.  Tricuspid Valve: The tricuspid valve is normal in structure. Tricuspid valve regurgitation is mild.  Aortic Valve: The aortic valve is tricuspid. There is mild calcification of the aortic valve. Aortic valve regurgitation is trivial. No aortic stenosis is present.  Pulmonic Valve: The pulmonic valve was normal in structure. Pulmonic valve regurgitation is not visualized.  Aorta: The aortic root is normal in size and structure.  Venous: The inferior vena cava is dilated in size with less than 50% respiratory variability, suggesting right atrial pressure of 15 mmHg.  IAS/Shunts: No atrial level shunt detected by color flow Doppler.   LEFT VENTRICLE PLAX 2D LVIDd:         4.70 cm LVIDs:         3.60 cm LV PW:         0.90 cm LV IVS:        1.25 cm   RIGHT VENTRICLE          IVC RV Basal diam:  3.60 cm  IVC diam: 2.20 cm  LEFT  ATRIUM              Index        RIGHT ATRIUM           Index LA diam:        4.50 cm  2.55 cm/m   RA Area:     32.40 cm LA Vol (A2C):   109.0 ml 61.82 ml/m  RA Volume:   114.00 ml 64.66 ml/m LA Vol (A4C):   70.3 ml  39.87 ml/m LA Biplane Vol: 91.1 ml  51.67 ml/m  AORTA Ao Root diam: 3.50 cm Ao Asc diam:  3.40 cm  MITRAL VALVE               TRICUSPID VALVE MV Area (PHT): 4.71 cm    TR Peak grad:   51.0 mmHg MV Decel Time: 161 msec    TR Vmax:        357.00 cm/s MV E velocity: 72.20 cm/s MV A velocity: 24.50 cm/s MV E/A ratio:  2.95  Dalton McleanMD Electronically signed by Franki Monte Signature Date/Time: 07/14/2022/3:41:59 PM    Final              Patient Profile     89 y.o. female with history of permanent atrial fibrillation, hypertension, stroke who was admitted on 07/13/2022 for acute hypoxic respiratory failure secondary to new onset systolic heart failure.   Assessment & Plan    Acute hypoxic respiratory failure secondary to pulmonary edema New onset systolic heart failure, EF 45% RV dysfunction/pulmonary hypertension -New diagnosis for her. BNP elevated.  Given advanced age and medical management has been recommended.  Patient and family are in agreement with this after discussion with Dr. Audie Box. -Chest x-ray with persistent pulmonary edema. -1.3 L urine output yesterday. -BP stable.  I Will continue with diuresis 60 mg IV Lasix twice daily.  Continue Aldactone 12.5 mg daily. Creat stable. 1.02. Can add back GDMT as we are able. -Avoiding AV nodal agents in the  setting of A-fib with bradycardia.   Permanent A-fib Sick sinus syndrome -Has A-fib with slow ventricular response.  Rhythm remains irregular.  Do not suspect complete heart block.  Heart rate will come up when she talks.  No indications for pacing at this time.  Avoid AV nodal agents.  We have decided on a conservative approach given her advanced age. -On Coumadin at home.  INR 3.2  today. -Consider transition to [place] at some point for ease of use.  Family will think about this.   DNR    For questions or updates, please contact [place] Please consult [email or web address] for contact info under        Signed, Candee Furbish, MD  07/17/2022, 9:19 AM

## 2022-07-17 NOTE — Progress Notes (Signed)
Rounding Note    Patient Name: Danielle Thomas Date of Encounter: 07/17/2022  [place] Cardiologist: Evalina Field, MD ***  Subjective   ***  Inpatient Medications    Scheduled Meds:  dorzolamide  1 drop Both Eyes BID   furosemide  60 mg Intravenous BID   latanoprost  1 drop Both Eyes QHS   rOPINIRole  0.5 mg Oral BID   sodium chloride flush  3 mL Intravenous Q12H   spironolactone  12.5 mg Oral Daily   Continuous Infusions:  sodium chloride     PRN Meds: sodium chloride, acetaminophen **OR** acetaminophen, melatonin, metoCLOPramide (REGLAN) injection, ondansetron (ZOFRAN) IV, rOPINIRole, senna-docusate, sodium chloride flush   Vital Signs    Vitals:   07/17/22 0441 07/17/22 0715 07/17/22 1119 07/17/22 1911  BP:  (!) 119/55 (!) 156/70 (!) 148/80  Pulse: 69  72   Resp: (!) '24  18 20  '$ Temp:  97.8 F (36.6 C) 97.8 F (36.6 C) 97.9 F (36.6 C)  TempSrc:  Oral Oral Oral  SpO2: 95%  97%   Weight: 60 kg     Height:        Intake/Output Summary (Last 24 hours) at 07/17/2022 2107 Last data filed at 07/17/2022 1911 Gross per 24 hour  Intake --  Output 1600 ml  Net -1600 ml      07/17/2022    4:41 AM 07/16/2022    3:42 AM 07/15/2022    5:54 AM  Last 3 Weights  Weight (lbs) 132 lb 4.4 oz 142 lb 6.7 oz 137 lb 2 oz  Weight (kg) 60 kg 64.6 kg 62.2 kg      Telemetry    *** - Personally Reviewed  ECG    *** - Personally Reviewed  Physical Exam  *** GEN: No acute distress.   Neck: No JVD Cardiac: RRR, no murmurs, rubs, or gallops.  Respiratory: Clear to auscultation bilaterally. GI: Soft, nontender, non-distended  MS: No edema; No deformity. Neuro:  Nonfocal  Psych: Normal affect   Labs    High Sensitivity Troponin:   Recent Labs  Lab 07/13/22 1845 07/13/22 2030  TROPONINIHS 19* 21*     Chemistry Recent Labs  Lab 07/14/22 0329 07/15/22 0026 07/16/22 0017 07/17/22 0021  NA 140 138 138 139  K 3.1* 3.9 4.1 4.1  CL 104 100 102  96*  CO2 '27 27 31 '$ 33*  GLUCOSE 105* 114* 113* 125*  BUN 18 22 26* 26*  CREATININE 0.85 1.02* 1.25* 1.02*  CALCIUM 8.9 9.0 8.7* 8.8*  MG 1.8 1.6* 2.5*  --   GFRNONAA >60 52* 40* 52*  ANIONGAP '9 11 5 10    '$ Lipids No results for input(s): "CHOL", "TRIG", "HDL", "LABVLDL", "LDLCALC", "CHOLHDL" in the last 168 hours.  Hematology Recent Labs  Lab 07/15/22 0026 07/16/22 0017 07/17/22 0021  WBC 7.0 7.0 9.6  RBC 4.28 4.35 4.31  HGB 12.0 11.8* 12.1  HCT 36.7 38.6 38.0  MCV 85.7 88.7 88.2  MCH 28.0 27.1 28.1  MCHC 32.7 30.6 31.8  RDW 17.1* 17.1* 16.5*  PLT 212 221 188   Thyroid No results for input(s): "TSH", "FREET4" in the last 168 hours.  BNP Recent Labs  Lab 07/13/22 1845  BNP 1,998.9*    DDimer No results for input(s): "DDIMER" in the last 168 hours.   Radiology    No results found.  Cardiac Studies   TTE 07/2022: IMPRESSIONS     1. Left ventricular ejection fraction, by estimation,  is 45 to 50%. The  left ventricle has mildly decreased function. The left ventricle  demonstrates global hypokinesis. Left ventricular diastolic parameters are  indeterminate.   2. Right ventricular systolic function is moderately reduced. The right  ventricular size is mildly enlarged. There is severely elevated pulmonary  artery systolic pressure. The estimated right ventricular systolic  pressure is 99991111 mmHg.   3. Left atrial size was severely dilated.   4. Right atrial size was severely dilated.   5. The mitral valve is normal in structure. Mild mitral valve  regurgitation. No evidence of mitral stenosis.   6. The aortic valve is tricuspid. There is mild calcification of the  aortic valve. Aortic valve regurgitation is trivial. No aortic stenosis is  present.   7. The inferior vena cava is dilated in size with <50% respiratory  variability, suggesting right atrial pressure of 15 mmHg.   8. The patient was in atrial fibrillation.   Patient Profile     89 y.o. female with  history of permanent Afib, HTN, and CVA who presented with acute hypoxic respiratory failure secondary to new onset systolic HF for which Cardiology was consulted.   Assessment & Plan    # Acute hypoxic respiratory failure secondary to pulmonary edema # New onset systolic heart failure, EF 45% # RV dysfunction/pulmonary hypertension -Newly diagnosed systolic HF. Given age and comorbidities, patient is being managed medically at this time with no plans for invasive work-up. Family is in agreement. -Continue lasox '60mg'$  IV BID -Continue spironolactone 12.'5mg'$  daily -Will continue to add GDMT as able -Avoiding AV nodal agents in the setting of A-fib with bradycardia.   # Permanent A-fib # Sick sinus syndrome -Has A-fib with SVR.  Rhythm remains irregular.  Do not suspect complete heart block.  Avoid AV nodal agents.  We have decided on a conservative approach given her advanced age. -On Coumadin at home.  INR *** -Consider transition to [place] at some point.  Family will think about this.   #AKI -Suspect cardiorenal.  Improving with diuresis.  {Are we signing off today?:[id number]}  For questions or updates, please contact [place] Please consult [email or web address] for contact info under        Signed, Freada Bergeron, MD  07/17/2022, 9:07 PM

## 2022-07-17 NOTE — Progress Notes (Signed)
Heart Failure Navigator Progress Note  Assessed for Heart & Vascular TOC clinic readiness.  Patient EF 45-50%, No TOC per Dr. Cathlean Sauer, will follow up with [facility].   Navigator will sign off at this time.   Earnestine Leys, BSN, Clinical cytogeneticist Only

## 2022-07-17 NOTE — Progress Notes (Addendum)
Progress Note   Patient: Danielle Thomas [id number] DOB: Apr 28, 1928 DOA: 07/13/2022     2 DOS: the patient was seen and examined on 07/17/2022   Brief hospital course: Mrs. Rydalch was admitted to the hospital with the working diagnosis of heart failure exacerbation.   89 yo female with the past medical history of hypertension, atrial fibrillation, restless leg syndrome, and osteoporosis who presented with dyspnea. Patient with worsening dyspnea for the last 3 days, associated with lower extremity edema. Patient was evaluated by her primary care provider who found her in atrial fibrillation with RVR and heart failure decompensation. She was referred to the ED for further evaluation. On her initial physical examination her blood pressure was 167/124, 174/117, HR 72, RR 29 and 02 saturation 90 to 94%, lungs with no wheezing or rales, no rhonchi, heart with S1 and S2 present, irregularly irregular, with no gallops or murmurs, abdomen with no distention and no lower extremity edema.   Na 139, K 4,3 Cl 106 bicarbonate 25 glucose 101 bun 19 cr 0,91  BNP 1,988  High sensitive troponin 19 and 21  Wbc 6.4 hgb 12,4 plt 204  Sars covid 19 negative   EKG 78 bpm, normal axis, normal intervals, atrial fibrillation with PVC, no significant ST segment or T wave changes.   03/09 patient placed on metoprolol and developed symptomatic bradycardia.  03/10 patient continue diuresis with IV furosemide, heart rate improved off metoprolol.  03/11 recovered heart rate off AV blockade. Continue volume overloaded.   Assessment and Plan: * Atrial fibrillation (HCC) Persistent atrial fibrillation, rate now 60 to 70 bpm.   Avoid AV blockade, possible sick sinus syndrome.   I spoke with her and her daughter and they are in agreement to transition to apixaban for anticoagulation.  Continue telemetry monitoring.    Acute on chronic diastolic CHF (congestive heart failure) (HCC) Echocardiogram with mild reduction in  systolic function with EF 45 to 50%, with global hypokinesis, moderate reduction in RV systolic function, RVSP 66 mmHg, LA and RA with severe dilatation, no significant valvular disease.   03/09 follow up chest radiograph personally reviewed with left rotation, cardiomegaly, bilateral hilar vascular congestion, small bilateral pleural effusions.   Urine output is documented at [id number] cc Systolic blood pressure has been 140 to 119 mmHg   Continue diuresis with furosemide IV 60 mg bid and spironolactone. Continue to hold on ARB for now.   Essential hypertension Hypertensive emergency.   Continue diuresis with furosemide and spironolactone. Hold on losartan for now.   Hypokalemia AKI, Hypomagnesemia.   Patient continue hypervolemic today, renal function with serum cr at 1.0 with K at 4,1 and serum bicarbonate at 33.   Plan to continue diuresis with furosemide and spironolactone Follow up renal function in am.   Compression fracture of T11 vertebra (HCC) PT and OT evaluation   Depression, major, single episode, moderate (HCC) Continue neuro checks per unit protocol        Subjective: Patient with improvement with dyspnea but not back to baseline, no chest pain   Physical Exam: Vitals:   07/17/22 0002 07/17/22 0339 07/17/22 0441 07/17/22 0715  BP: (!) 145/77 (!) 149/72  (!) 119/55  Pulse: 64 63 69   Resp: (!) 21 (!) 23 (!) 24   Temp: 98.2 F (36.8 C) 98.4 F (36.9 C)  97.8 F (36.6 C)  TempSrc: Oral Oral  Oral  SpO2: 98% 96% 95%   Weight:   60 kg   Height:  Neurology awake and alert ENT with mild pallor  Cardiovascular with S1 and S2 present, irregularly irregular with no gallops, rubs or murmurs Positive JVD Trace lower extremity edema Respiratory with rales at bases bilaterally with no rhonchi or wheezing Abdomen with no distention  Data Reviewed:    Family Communication: no family at the bedside   Disposition: Status is: Inpatient Remains  inpatient appropriate because: IV diuresis   Planned Discharge Destination: Home    Author: Tawni Millers, MD 07/17/2022 10:00 AM  For on call review [email or web address].

## 2022-07-17 NOTE — Progress Notes (Signed)
Physical Therapy Treatment Patient Details Name: Danielle Thomas MRN: [id number] DOB: 12/13/1927 Today's Date: 07/17/2022   History of Present Illness 89 y.o. female presents to [facility] hospital on 07/13/2022 with SOB and LE edema. Pt admitted for management of acute CHF and afib. PMH includes HTN, afib, RLS, osteoporosis, T11 compression fx.    PT Comments    Pt received in supine, agreeable to therapy session with encouragement, with emphasis on transfer, gait and stair negotiation. Pt needing up to modA to perform stair ascent/descent with single UE support and min guard to Supervision for transfers/gait with RW with mostly safety/postural cues needed. Pt up in recliner at end of session, RN notified pt will need chair alarm placed (none in room when pt went to sit and was too fatigued to stand and place after gait/stair trial). Pt continues to benefit from PT services to progress toward functional mobility goals.    Recommendations for follow up therapy are one component of a multi-disciplinary discharge planning process, led by the attending physician.  Recommendations may be updated based on patient status, additional functional criteria and insurance authorization.  Follow Up Recommendations  Home health PT     Assistance Recommended at Discharge PRN  Patient can return home with the following A little help with walking and/or transfers;A little help with bathing/dressing/bathroom;Assistance with cooking/housework;Assist for transportation;Help with stairs or ramp for entrance   Equipment Recommendations  None recommended by PT    Recommendations for Other Services       Precautions / Restrictions Precautions Precautions: Fall Precaution Comments: monitor sats (reports not using O2 at baseline) Restrictions Weight Bearing Restrictions: No     Mobility  Bed Mobility Overal bed mobility: Needs Assistance Bed Mobility: Supine to Sit     Supine to sit: Supervision, HOB elevated      General bed mobility comments: HOB elevated ~30*, pt using bed rail/features to EOB    Transfers Overall transfer level: Needs assistance Equipment used: Rolling walker (2 wheels) Transfers: Sit to/from Stand, Bed to chair/wheelchair/BSC Sit to Stand: Supervision           General transfer comment: cues needed for safe UE placement when going from stand>sit    Ambulation/Gait Ambulation/Gait assistance: Supervision Gait Distance (Feet): 110 Feet Assistive device: Rolling walker (2 wheels) Gait Pattern/deviations: Step-through pattern Gait velocity: reduced     General Gait Details: slowed step-through gait   Stairs Stairs: Yes Stairs assistance: Min assist, Mod assist Stair Management: One rail Left, Step to pattern, Forwards (HHA on RUE) Number of Stairs: 10 General stair comments: pt ascended/descended single 7" platform step in room x10 reps with LUE rail and RUE HHA to simulate home set-up (x5 reps), pt needing min to modA when ascending/descending for RUE support. With BUE support of RW to simulate bilateral rails (x5 reps), pt needing min guard to minA support for safety/stability.   Wheelchair Mobility    Modified Rankin (Stroke Patients Only)       Balance Overall balance assessment: Needs assistance Sitting-balance support: No upper extremity supported, Feet supported Sitting balance-Leahy Scale: Good     Standing balance support: Bilateral upper extremity supported, During functional activity Standing balance-Leahy Scale: Poor Standing balance comment: reliant on at least one UE support in standing                            Cognition Arousal/Alertness: Awake/alert Behavior During Therapy: WFL for tasks assessed/performed Overall Cognitive Status: Difficult  to assess                                 General Comments: difficult to fully assess d/t HOH and requiring repetition of cues. pt does need cues for sequencing and  problem solving though functional        Exercises      General Comments General comments (skin integrity, edema, etc.): SpO2 86% sitting EOB after a few mins on RA, once on 2L O2 [place] SpO2 improves to 92% after a couple mins and SpO2 92% on 2L O2 [place] for gait trial in hallway and stair training in the room.      Pertinent Vitals/Pain Pain Assessment Pain Assessment: No/denies pain     PT Goals (current goals can now be found in the care plan section) Acute Rehab PT Goals Patient Stated Goal: to return home PT Goal Formulation: With patient Time For Goal Achievement: 07/28/22 Progress towards PT goals: Progressing toward goals    Frequency    Min 3X/week      PT Plan Current plan remains appropriate       AM-PAC PT "6 Clicks" Mobility   Outcome Measure  Help needed turning from your back to your side while in a flat bed without using bedrails?: A Little Help needed moving from lying on your back to sitting on the side of a flat bed without using bedrails?: A Little Help needed moving to and from a bed to a chair (including a wheelchair)?: A Little Help needed standing up from a chair using your arms (e.g., wheelchair or bedside chair)?: A Little Help needed to walk in hospital room?: A Little Help needed climbing 3-5 steps with a railing? : A Lot (with single rail) 6 Click Score: 17    End of Session Equipment Utilized During Treatment: Gait belt;Oxygen Activity Tolerance: Patient tolerated treatment well Patient left: in chair;with call bell/phone within reach Nurse Communication: Mobility status;Other (comment) (no chair alarm in room, pt able to demo back use of call bell) PT Visit Diagnosis: Other abnormalities of gait and mobility (R26.89);Muscle weakness (generalized) (M62.81)     Time: [id number] PT Time Calculation (min) (ACUTE ONLY): 29 min  Charges:  $Gait Training: 8-22 mins $Therapeutic Activity: 8-22 mins                     Danielle Thomas P., PTA Acute  Rehabilitation Services Secure Chat Preferred 9a-5:30pm Office: [place] 07/17/2022, 12:54 PM

## 2022-07-18 ENCOUNTER — Other Ambulatory Visit (HOSPITAL_COMMUNITY): Payer: Self-pay

## 2022-07-18 DIAGNOSIS — I4811 Longstanding persistent atrial fibrillation: Secondary | ICD-10-CM | POA: Diagnosis not present

## 2022-07-18 DIAGNOSIS — D6869 Other thrombophilia: Secondary | ICD-10-CM

## 2022-07-18 DIAGNOSIS — I5021 Acute systolic (congestive) heart failure: Secondary | ICD-10-CM | POA: Diagnosis not present

## 2022-07-18 DIAGNOSIS — I10 Essential (primary) hypertension: Secondary | ICD-10-CM | POA: Diagnosis not present

## 2022-07-18 DIAGNOSIS — I5033 Acute on chronic diastolic (congestive) heart failure: Secondary | ICD-10-CM | POA: Diagnosis not present

## 2022-07-18 DIAGNOSIS — I4821 Permanent atrial fibrillation: Secondary | ICD-10-CM | POA: Diagnosis not present

## 2022-07-18 DIAGNOSIS — S22080D Wedge compression fracture of T11-T12 vertebra, subsequent encounter for fracture with routine healing: Secondary | ICD-10-CM | POA: Diagnosis not present

## 2022-07-18 LAB — MAGNESIUM: Magnesium: 1.5 mg/dL — ABNORMAL LOW (ref 1.7–2.4)

## 2022-07-18 LAB — BASIC METABOLIC PANEL
Anion gap: 9 (ref 5–15)
BUN: 21 mg/dL (ref 8–23)
CO2: 40 mmol/L — ABNORMAL HIGH (ref 22–32)
Calcium: 8.7 mg/dL — ABNORMAL LOW (ref 8.9–10.3)
Chloride: 90 mmol/L — ABNORMAL LOW (ref 98–111)
Creatinine, Ser: 0.86 mg/dL (ref 0.44–1.00)
GFR, Estimated: >60 mL/min (ref >60)
Glucose, Bld: 112 mg/dL — ABNORMAL HIGH (ref 70–99)
Potassium: 3.2 mmol/L — ABNORMAL LOW (ref 3.5–5.1)
Sodium: 139 mmol/L (ref 135–145)

## 2022-07-18 LAB — PROTIME-INR
INR: 2.5 — ABNORMAL HIGH (ref 0.8–1.2)
Prothrombin Time: 27.1 seconds — ABNORMAL HIGH (ref 11.4–15.2)

## 2022-07-18 MED ORDER — NYSTATIN 100000 UNIT/GM EX CREA
TOPICAL_CREAM | Freq: Two times a day (BID) | CUTANEOUS | Status: DC
  Administered 2022-07-20: 1 via TOPICAL
  Filled 2022-07-18: qty 30

## 2022-07-18 MED ORDER — POTASSIUM CHLORIDE CRYS ER 20 MEQ PO TBCR
40.0000 meq | EXTENDED_RELEASE_TABLET | ORAL | Status: AC
  Administered 2022-07-18 (×2): 40 meq via ORAL
  Filled 2022-07-18 (×2): qty 2

## 2022-07-18 MED ORDER — LOSARTAN POTASSIUM 25 MG PO TABS
25.0000 mg | ORAL_TABLET | Freq: Every day | ORAL | Status: DC
  Administered 2022-07-18 – 2022-07-20 (×3): 25 mg via ORAL
  Filled 2022-07-18 (×3): qty 1

## 2022-07-18 MED ORDER — APIXABAN 2.5 MG PO TABS
2.5000 mg | ORAL_TABLET | Freq: Two times a day (BID) | ORAL | Status: DC
  Administered 2022-07-18 – 2022-07-20 (×4): 2.5 mg via ORAL
  Filled 2022-07-18 (×4): qty 1

## 2022-07-18 MED ORDER — MAGNESIUM SULFATE 4 GM/100ML IV SOLN
4.0000 g | Freq: Once | INTRAVENOUS | Status: AC
  Administered 2022-07-18: 4 g via INTRAVENOUS
  Filled 2022-07-18: qty 100

## 2022-07-18 NOTE — Plan of Care (Signed)

## 2022-07-18 NOTE — Care Management Important Message (Signed)
Important Message  Patient Details  Name: STAFANIE CASTANON MRN: [id number] Date of Birth: [birth year]   Medicare Important Message Given:  Yes     Aliese Brannum [address] 07/18/2022, 1:51 PM

## 2022-07-18 NOTE — Progress Notes (Signed)
Progress Note   Patient: Danielle Thomas [id number] DOB: 09-27-1927 DOA: 07/13/2022     3 DOS: the patient was seen and examined on 07/18/2022   Brief hospital course: Danielle Thomas was admitted to the hospital with the working diagnosis of heart failure exacerbation.   89 yo female with the past medical history of hypertension, atrial fibrillation, restless leg syndrome, and osteoporosis who presented with dyspnea. Patient with worsening dyspnea for the last 3 days, associated with lower extremity edema. Patient was evaluated by her primary care provider who found her in atrial fibrillation with RVR and heart failure decompensation. She was referred to the ED for further evaluation. On her initial physical examination her blood pressure was 167/124, 174/117, HR 72, RR 29 and 02 saturation 90 to 94%, lungs with no wheezing or rales, no rhonchi, heart with S1 and S2 present, irregularly irregular, with no gallops or murmurs, abdomen with no distention and no lower extremity edema.   Na 139, K 4,3 Cl 106 bicarbonate 25 glucose 101 bun 19 cr 0,91  BNP 1,988  High sensitive troponin 19 and 21  Wbc 6.4 hgb 12,4 plt 204  Sars covid 19 negative   EKG 78 bpm, normal axis, normal intervals, atrial fibrillation with PVC, no significant ST segment or T wave changes.   03/09 patient placed on metoprolol and developed symptomatic bradycardia.  03/10 patient continue diuresis with IV furosemide, heart rate improved off metoprolol.  03/11 recovered heart rate off AV blockade. Continue volume overloaded.  03/12 improving volume status, but not yet euvolemic.   Assessment and Plan: * Atrial fibrillation (HCC) Persistent atrial fibrillation, rate 60 to 70 bpm, personally reviewed telemetry.   Avoid AV blockade, possible sick sinus syndrome.   She will be transitioned to apixaban, when INR below therapeutic range per pharmacy protocol. Patient and her daughters agree with apixaban.     Acute on chronic  diastolic CHF (congestive heart failure) (HCC) Echocardiogram with mild reduction in systolic function with EF 45 to 50%, with global hypokinesis, moderate reduction in RV systolic function, RVSP 66 mmHg, LA and RA with severe dilatation, no significant valvular disease.   03/09 follow up chest radiograph personally reviewed with left rotation, cardiomegaly, bilateral hilar vascular congestion, small bilateral pleural effusions.   Urine output is documented at [id number] cc Systolic blood pressure has been 140 to 150 mmHg   Continue diuresis with furosemide IV 60 mg bid. Guideline medical therapy with losartan and spironolactone.  No B blocker due to bradycardia.    Essential hypertension Hypertensive emergency.   Continue diuresis with furosemide and spironolactone. Resumed losartan.   Hypokalemia AKI, Hypomagnesemia, contraction metabolic alkalosis.    Renal function with serum cr at 0,86, K is 3,2 and serum bicarbonate at 40  Na 139 and Mg 1,5   Plan to continue diuresis with furosemide and spironolactone Add Kcl 80 meq in 2 divided doses and add 4 g mag sulfate IV. Follow up renal function and electrolytes in am.   Compression fracture of T11 vertebra (HCC) PT and OT evaluation   Depression, major, single episode, moderate (HCC) Continue neuro checks per unit protocol        Subjective: Patient is feeling better, continue to have dyspnea on exertion and 02 desaturation with ambulation, no chest pain.   Physical Exam: Vitals:   07/17/22 1911 07/17/22 2344 07/18/22 0512 07/18/22 0741  BP: (!) 148/80 137/76 (!) 154/83 (!) 142/82  Pulse:   64 69  Resp: '20 17 19 '$ 20  Temp: 97.9 F (36.6 C) 98.3 F (36.8 C) 98.4 F (36.9 C) 97.6 F (36.4 C)  TempSrc: Oral Oral Oral Oral  SpO2:   96% 96%  Weight:   57.6 kg   Height:       Neurology awake and alert ENT with mild pallor Cardiovascular with S1 and S2 present, irregularly irregular, with no gallops or rubs. Positive  systolic murmur at the apex. Mild JVD No lower extremity edema  Respiratory with mild rales at bases, no wheezing or rhonchi, poor inspiratory effort.  Abdomen with no distention  Data Reviewed:    Family Communication: no family at the bedside   Disposition: Status is: Inpatient Remains inpatient appropriate because: heart failure   Planned Discharge Destination: Home possible discharge tomorrow.      Author: Tawni Millers, MD 07/18/2022 9:38 AM  For on call review [email or web address].

## 2022-07-18 NOTE — Progress Notes (Signed)
Mobility Specialist Progress Note:   07/18/22 1545  Mobility  Activity Ambulated with assistance in room  Level of Assistance Contact guard assist, steadying assist  Assistive Device Front wheel walker  Distance Ambulated (ft) 40 ft  Activity Response Tolerated fair  Mobility Referral Yes  $Mobility charge 1 Mobility   Pt agreeable to mobility session. Required steadying assist throughout. Pt c/o incr generalized weakness today. Declined further ambulation, pt back in chair with all needs met. Alarm on.  Nelta Numbers Mobility Specialist Please contact via SecureChat or  Rehab office at [phone]

## 2022-07-18 NOTE — TOC Benefit Eligibility Note (Signed)
Patient Teacher, English as a foreign language completed.    The patient is currently admitted and upon discharge could be taking Eliquis 2.5 mg.  The current 30 day co-pay is $11.20.   The patient is insured through [place] Patient Advocate Specialist [place] Patient Advocate Team Direct Number: [phone]  Fax: [phone]

## 2022-07-18 NOTE — Progress Notes (Incomplete)
PROGRESS NOTE    Danielle Thomas  [id number] DOB: 03-22-1928 DOA: 07/13/2022 PCP: Cari Caraway, MD   89/F w/ hypertension, atrial fibrillation, restless leg syndrome, and osteoporosis who presented with dyspnea x 3 days, with lower extremity edema. Patient was evaluated by her primary care provider who found her in atrial fibrillation with RVR and heart failure decompensation.  In ED,  cr 0,91, BNP 1,988, troponin 19 and 21  EKG atrial fibrillation with PVC -3/09 patient placed on metoprolol and developed symptomatic bradycardia.  -3/10 patient continue diuresis with IV furosemide, heart rate improved off metoprolol.  -3/11 recovered heart rate off AV blockade. Continue volume overloaded.  -3/12 improving volume status, but not yet euvolemic.     Subjective:  Assessment and Plan:  Atrial fibrillation (HCC) Persistent atrial fibrillation, rate 60 to 70 bpm, Avoid AV blockade, possible sick sinus syndrome.  -transitioned to apixaban   Acute on chronic diastolic CHF (congestive heart failure) ([place]) Echo with EF 45 to 50%, with global hypokinesis, moderate reduction in RV systolic function,  [id number] follow up CXR bilateral hilar vascular congestion, small bilateral pleural effusions.  -diuresis with furosemide IV 60 mg bid. Guideline medical therapy with losartan and spironolactone.  No B blocker due to bradycardia.   Essential hypertension Hypertensive emergency.  -diuresis with furosemide and spironolactone. Resumed losartan.   Hypokalemia AKI, Hypomagnesemia, contraction metabolic alkalosis.   -repleted  Compression fracture of T11 vertebra (HCC) PT and OT evaluation  Depression, major, single episode, moderate (HCC)  DVT prophylaxis: apixaban Code Status: DNR Family Communication: Disposition Plan:   Consultants:    Procedures:   Antimicrobials:    Objective: Vitals:   07/17/22 2344 07/18/22 0512 07/18/22 0741 07/18/22 1053  BP: 137/76 (!) 154/83 (!)  142/82 (!) 165/73  Pulse:  64 69 78  Resp: '17 19 20 16  '$ Temp: 98.3 F (36.8 C) 98.4 F (36.9 C) 97.6 F (36.4 C) 97.9 F (36.6 C)  TempSrc: Oral Oral Oral Oral  SpO2:  96% 96% 94%  Weight:  57.6 kg    Height:        Intake/Output Summary (Last 24 hours) at 07/18/2022 1405 Last data filed at 07/18/2022 1052 Gross per 24 hour  Intake 240 ml  Output 1750 ml  Net -1510 ml   Filed Weights   07/16/22 0342 07/17/22 0441 07/18/22 0512  Weight: 64.6 kg 60 kg 57.6 kg    Examination:     Data Reviewed:   CBC: Recent Labs  Lab 07/13/22 1845 07/14/22 0329 07/15/22 0026 07/16/22 0017 07/17/22 0021  WBC 6.4 6.8 7.0 7.0 9.6  NEUTROABS 4.6  --   --   --   --   HGB 12.4 12.2 12.0 11.8* 12.1  HCT 39.5 37.2 36.7 38.6 38.0  MCV 87.8 85.3 85.7 88.7 88.2  PLT 204 179 212 221 0000000   Basic Metabolic Panel: Recent Labs  Lab 07/14/22 0329 07/15/22 0026 07/16/22 0017 07/17/22 0021 07/18/22 0019  NA 140 138 138 139 139  K 3.1* 3.9 4.1 4.1 3.2*  CL 104 100 102 96* 90*  CO2 '27 27 31 '$ 33* 40*  GLUCOSE 105* 114* 113* 125* 112*  BUN 18 22 26* 26* 21  CREATININE 0.85 1.02* 1.25* 1.02* 0.86  CALCIUM 8.9 9.0 8.7* 8.8* 8.7*  MG 1.8 1.6* 2.5*  --  1.5*   GFR: Estimated Creatinine Clearance: 34.5 mL/min (by C-G formula based on SCr of 0.86 mg/dL). Liver Function Tests: No results for input(s): "AST", "ALT", "ALKPHOS", "BILITOT", "  PROT", "ALBUMIN" in the last 168 hours. No results for input(s): "LIPASE", "AMYLASE" in the last 168 hours. No results for input(s): "AMMONIA" in the last 168 hours. Coagulation Profile: Recent Labs  Lab 07/14/22 0329 07/15/22 0026 07/16/22 0017 07/17/22 0021 07/18/22 0019  INR 3.2* 2.9* 3.5* 3.2* 2.5*   Cardiac Enzymes: No results for input(s): "CKTOTAL", "CKMB", "CKMBINDEX", "TROPONINI" in the last 168 hours. BNP (last 3 results) No results for input(s): "PROBNP" in the last 8760 hours. HbA1C: No results for input(s): "HGBA1C" in the last 72  hours. CBG: No results for input(s): "GLUCAP" in the last 168 hours. Lipid Profile: No results for input(s): "CHOL", "HDL", "LDLCALC", "TRIG", "CHOLHDL", "LDLDIRECT" in the last 72 hours. Thyroid Function Tests: No results for input(s): "TSH", "T4TOTAL", "FREET4", "T3FREE", "THYROIDAB" in the last 72 hours. Anemia Panel: No results for input(s): "VITAMINB12", "FOLATE", "FERRITIN", "TIBC", "IRON", "RETICCTPCT" in the last 72 hours. Urine analysis:    Component Value Date/Time   COLORURINE AMBER (A) 05/02/2020 1442   APPEARANCEUR TURBID (A) 05/02/2020 1442   LABSPEC 1.023 05/02/2020 1442   PHURINE 5.0 05/02/2020 1442   GLUCOSEU NEGATIVE 05/02/2020 1442   HGBUR LARGE (A) 05/02/2020 1442   BILIRUBINUR NEGATIVE 05/02/2020 1442   KETONESUR NEGATIVE 05/02/2020 1442   PROTEINUR 100 (A) 05/02/2020 1442   NITRITE NEGATIVE 05/02/2020 1442   LEUKOCYTESUR MODERATE (A) 05/02/2020 1442   Sepsis Labs: '@LABRCNTIP'$ (procalcitonin:4,lacticidven:4)  ) Recent Results (from the past 240 hour(s))  Resp panel by RT-PCR (RSV, Flu A&B, Covid) Anterior Nasal Swab     Status: None   Collection Time: 07/13/22  6:45 PM   Specimen: Anterior Nasal Swab  Result Value Ref Range Status   SARS Coronavirus 2 by RT PCR NEGATIVE NEGATIVE Final   Influenza A by PCR NEGATIVE NEGATIVE Final   Influenza B by PCR NEGATIVE NEGATIVE Final    Comment: (NOTE) The Xpert Xpress SARS-CoV-2/FLU/RSV plus assay is intended as an aid in the diagnosis of influenza from Nasopharyngeal swab specimens and should not be used as a sole basis for treatment. Nasal washings and aspirates are unacceptable for Xpert Xpress SARS-CoV-2/FLU/RSV testing.  Fact Sheet for Patients: EntrepreneurPulse.com.au  Fact Sheet for Healthcare Providers: IncredibleEmployment.be  This test is not yet approved or cleared by the [place] FDA and has been authorized for detection and/or diagnosis of SARS-CoV-2  by FDA under an Emergency Use Authorization (EUA). This EUA will remain in effect (meaning this test can be used) for the duration of the COVID-19 declaration under Section 564(b)(1) of the Act, 21 U.S.C. section 360bbb-3(b)(1), unless the authorization is terminated or revoked.     Resp Syncytial Virus by PCR NEGATIVE NEGATIVE Final    Comment: (NOTE) Fact Sheet for Patients: EntrepreneurPulse.com.au  Fact Sheet for Healthcare Providers: IncredibleEmployment.be  This test is not yet approved or cleared by the [place] FDA and has been authorized for detection and/or diagnosis of SARS-CoV-2 by FDA under an Emergency Use Authorization (EUA). This EUA will remain in effect (meaning this test can be used) for the duration of the COVID-19 declaration under Section 564(b)(1) of the Act, 21 U.S.C. section 360bbb-3(b)(1), unless the authorization is terminated or revoked.  Performed at [facility], [place] [address], [place], [place] [address]      Radiology Studies: No results found.   Scheduled Meds:  apixaban  2.5 mg Oral BID   dorzolamide  1 drop Both Eyes BID   furosemide  60 mg Intravenous BID   latanoprost  1 drop  Both Eyes QHS   losartan  25 mg Oral Daily   nystatin cream   Topical BID   potassium chloride  40 mEq Oral Q4H   rOPINIRole  0.5 mg Oral BID   sodium chloride flush  3 mL Intravenous Q12H   spironolactone  12.5 mg Oral Daily   Continuous Infusions:  sodium chloride       LOS: 3 days    Time spent: 43mn    PDomenic Polite MD [facility]   07/18/2022, 2:05 PM

## 2022-07-18 NOTE — Progress Notes (Signed)
   07/18/22 1700  Spiritual Encounters  Type of Visit Initial  Care provided to: Patient  Conversation partners present during encounter Nurse  Referral source Nurse (RN/NT/LPN)  Reason for visit Routine spiritual support  OnCall Visit No   Visited patient attempted to converse however patient confused. Patient tried to get out of chair by herself. Patient asked for assistance, advised I would get the nurse. Patient insist on getting up by herself. Rushed to nursing station for assistance. Once nurse arrived and moved patient to bed, I attempted to talk with patient however she was disoriented. So sat for a few minutes.

## 2022-07-18 NOTE — Discharge Instructions (Signed)

## 2022-07-18 NOTE — Progress Notes (Signed)
ANTICOAGULATION CONSULT NOTE - Follow-up Consult  Pharmacy Consult for Transition Coumadin to Apixaban Indication: atrial fibrillation  Allergies  Allergen Reactions   Amlodipine Besylate     Other reaction(s): high dose make feet swell   Codeine Nausea And Vomiting   Meperidine Nausea Only   Metoprolol Succinate [Metoprolol]     Other reaction(s): effects her breathing   Oxycodone Nausea And Vomiting    Patient Measurements: Height: '5\' 3"'$  (160 cm) Weight: 57.6 kg (126 lb 15.8 oz) IBW/kg (Calculated) : 52.4  Vital Signs: Temp: 98.4 F (36.9 C) (03/12 0512) Temp Source: Oral (03/12 0512) BP: 154/83 (03/12 0512) Pulse Rate: 64 (03/12 0512)  Labs: Recent Labs    07/16/22 0017 07/17/22 0021 07/18/22 0019  HGB 11.8* 12.1  --   HCT 38.6 38.0  --   PLT 221 188  --   LABPROT 34.7* 32.4* 27.1*  INR 3.5* 3.2* 2.5*  CREATININE 1.25* 1.02* 0.86    Estimated Creatinine Clearance: 34.5 mL/min (by C-G formula based on SCr of 0.86 mg/dL).   Medical History: Past Medical History:  Diagnosis Date   Acute CHF (congestive heart failure) ([place]) 07/13/2022   Hypertension     Medications:  No current facility-administered medications on file prior to encounter.   Current Outpatient Medications on File Prior to Encounter  Medication Sig Dispense Refill   amLODipine (NORVASC) 5 MG tablet Take 5 mg by mouth daily.     dorzolamide (TRUSOPT) 2 % ophthalmic solution Place 1 drop into both eyes 2 (two) times daily.      hydrALAZINE (APRESOLINE) 25 MG tablet Take 25 mg by mouth in the morning and at bedtime.     latanoprost (XALATAN) 0.005 % ophthalmic solution Place 1 drop into both eyes at bedtime. 2.5 mL 0   rOPINIRole (REQUIP) 0.25 MG tablet Take 1 tablet (0.25 mg total) by mouth daily as needed (restless legs around 10am - home dose). (Patient taking differently: Take 0.25 mg by mouth 4 (four) times daily as needed (restless leg). Taken 4pm and 8pm and an additional dose in the middle  of the night and possibly in the morning.) 30 tablet 0   warfarin (COUMADIN) 1 MG tablet Take 1 tablet (1 mg total) by mouth every Tuesday, Thursday, and Saturday at 6 PM. On Tues, Thurs, Sat (Patient taking differently: Take 1-1.5 mg by mouth See admin instructions. Take 1 tablet by mouth daily at 6pm on Mon, Wed, Fri, Sat. Take 1.'5mg'$  daily at 6pm on Sun, Tues, Thurs) 15 tablet 0   acetaminophen (TYLENOL) 500 MG tablet Take 650 mg by mouth every 6 (six) hours as needed. (Patient not taking: Reported on 07/14/2022)     DULoxetine (CYMBALTA) 30 MG capsule Take 1 capsule (30 mg total) by mouth daily. (Patient not taking: Reported on 07/14/2022) 30 capsule 0   losartan (COZAAR) 50 MG tablet Take 1 tablet (50 mg total) by mouth daily. (Patient not taking: Reported on 07/14/2022) 30 tablet 0     Assessment: 89 y.o. female admitted with PNA and has a history of AFib on Warfarin prior to admission. Pt reports taking Warfarin '1mg'$  daily on Monday, Wednesday, Friday, and Saturday and 1.'5mg'$  daily on Sunday, Tuesday, and Thursdays. Last prescribed regimen was 1.5 mg daily and 3 mg on Tuesdays. Last dose PTA was 07/12/22 at 1800. Pharmacy initially consulted to dose Warfarin and now to transition to Apixaban.   INR trending down nicely at 2.5 this AM. CBC is stable. No s/sx of bleeding. PO  intake recorded as 50-75%. Remains in Afib. Rate controlled.   Goal of Therapy:  Start Apixaban when INR <=2 Monitor platelets by anticoagulation protocol: Yes   Plan:  Start Apixaban 2.'5mg'$  po BID this PM (low dose due to age >54, weight <60 kg). Discontinue daily INR.  Monitor CBC and for signs and symptoms of bleeding while on therapy.   Thank you for involving pharmacy in this patient's care.  Sloan Leiter, PharmD, BCPS, BCCCP Clinical Pharmacist Please refer to [facility] for [place] numbers 07/18/2022 7:33 AM

## 2022-07-19 DIAGNOSIS — I5021 Acute systolic (congestive) heart failure: Secondary | ICD-10-CM | POA: Diagnosis not present

## 2022-07-19 DIAGNOSIS — I4811 Longstanding persistent atrial fibrillation: Secondary | ICD-10-CM | POA: Diagnosis not present

## 2022-07-19 DIAGNOSIS — I4821 Permanent atrial fibrillation: Secondary | ICD-10-CM | POA: Diagnosis not present

## 2022-07-19 LAB — BASIC METABOLIC PANEL
Anion gap: 12 (ref 5–15)
BUN: 23 mg/dL (ref 8–23)
CO2: 39 mmol/L — ABNORMAL HIGH (ref 22–32)
Calcium: 8.6 mg/dL — ABNORMAL LOW (ref 8.9–10.3)
Chloride: 88 mmol/L — ABNORMAL LOW (ref 98–111)
Creatinine, Ser: 0.87 mg/dL (ref 0.44–1.00)
GFR, Estimated: >60 mL/min (ref >60)
Glucose, Bld: 110 mg/dL — ABNORMAL HIGH (ref 70–99)
Potassium: 3.4 mmol/L — ABNORMAL LOW (ref 3.5–5.1)
Sodium: 139 mmol/L (ref 135–145)

## 2022-07-19 LAB — MAGNESIUM: Magnesium: 2.3 mg/dL (ref 1.7–2.4)

## 2022-07-19 MED ORDER — POTASSIUM CHLORIDE CRYS ER 20 MEQ PO TBCR
40.0000 meq | EXTENDED_RELEASE_TABLET | Freq: Once | ORAL | Status: AC
  Administered 2022-07-19: 40 meq via ORAL
  Filled 2022-07-19: qty 2

## 2022-07-19 MED ORDER — EMPAGLIFLOZIN 10 MG PO TABS
10.0000 mg | ORAL_TABLET | Freq: Every day | ORAL | Status: DC
  Administered 2022-07-19 – 2022-07-20 (×2): 10 mg via ORAL
  Filled 2022-07-19 (×2): qty 1

## 2022-07-19 MED ORDER — POTASSIUM CHLORIDE CRYS ER 20 MEQ PO TBCR: 40.0000 meq | EXTENDED_RELEASE_TABLET | Freq: Two times a day (BID) | ORAL | Status: DC

## 2022-07-19 MED ORDER — FUROSEMIDE 40 MG PO TABS
40.0000 mg | ORAL_TABLET | Freq: Every day | ORAL | Status: DC
  Administered 2022-07-19 – 2022-07-20 (×2): 40 mg via ORAL
  Filled 2022-07-19 (×2): qty 1

## 2022-07-19 MED ORDER — ACETAZOLAMIDE 250 MG PO TABS
500.0000 mg | ORAL_TABLET | Freq: Once | ORAL | Status: AC
  Administered 2022-07-19: 500 mg via ORAL
  Filled 2022-07-19: qty 2

## 2022-07-19 NOTE — Progress Notes (Signed)
Physical Therapy Treatment Patient Details Name: Danielle Thomas MRN: [id number] DOB: Jun 09, 1927 Today's Date: 07/19/2022   History of Present Illness 89 y.o. female presents to [facility] hospital on 07/13/2022 with SOB and LE edema. Pt admitted for management of acute CHF and afib. PMH includes HTN, afib, RLS, osteoporosis, T11 compression fx.    PT Comments    Pt supine in bed receiving morning medications. RN requests walking saturations. Pt supervision for bed mobility but limited in mobility by bilateral foot and ankle pain L>R. Pt requires max-modA for pivoting to [facility] and pushing up to RW. Requires total A for pericare. Pt ambulates short distance with minA but requires 2L O2 via [place] to maintain SpO2 >90%O2. Given pt difficulty with mobility and self care and need novel need for supplemental O2. PT changing discharge disposition to short term SNF level rehab prior to returning home. PT and Mobility Specialist will continue to follow acutely.     Recommendations for follow up therapy are one component of a multi-disciplinary discharge planning process, led by the attending physician.  Recommendations may be updated based on patient status, additional functional criteria and insurance authorization.  Follow Up Recommendations  Skilled nursing-short term rehab (<3 hours/day)     Assistance Recommended at Discharge PRN  Patient can return home with the following Assistance with cooking/housework;Assist for transportation;Help with stairs or ramp for entrance;A lot of help with walking and/or transfers;A lot of help with bathing/dressing/bathroom;Direct supervision/assist for medications management   Equipment Recommendations  None recommended by PT       Precautions / Restrictions Precautions Precautions: Fall Precaution Comments: monitor sats (reports not using O2 at baseline) Restrictions Weight Bearing Restrictions: No     Mobility  Bed Mobility Overal bed mobility: Needs Assistance Bed  Mobility: Supine to Sit     Supine to sit: Supervision, HOB elevated     General bed mobility comments: HOB elevated ~30*, pt using bed rail/features to EOB    Transfers Overall transfer level: Needs assistance Equipment used: Rolling walker (2 wheels) Transfers: Sit to/from Stand, Bed to chair/wheelchair/BSC Sit to Stand: Mod assist, Max assist   Step pivot transfers: Max assist       General transfer comment: maxA for stand pivot to [facility] initially due to increased L ankle pain, and R foot pain mod-maxA for power up for pivot to recliner and then for standing to ambulate in room    Ambulation/Gait Ambulation/Gait assistance: Min assist Gait Distance (Feet): 20 Feet Assistive device: Rolling walker (2 wheels) Gait Pattern/deviations: Step-through pattern Gait velocity: reduced Gait velocity interpretation: <1.31 ft/sec, indicative of household ambulator   General Gait Details: slowed, shuffling, antalgic gait, pain improved with donning shoes but continues to be painful with weightbearing       Balance Overall balance assessment: Needs assistance Sitting-balance support: No upper extremity supported, Feet supported Sitting balance-Leahy Scale: Good     Standing balance support: Bilateral upper extremity supported, During functional activity Standing balance-Leahy Scale: Poor Standing balance comment: reliant on at least one UE support in standing                            Cognition Arousal/Alertness: Awake/alert Behavior During Therapy: WFL for tasks assessed/performed Overall Cognitive Status: Difficult to assess                                 General Comments:  difficult to fully assess d/t HOH and requiring repetition of cues. pt does need cues for sequencing and problem solving though functional           General Comments General comments (skin integrity, edema, etc.): requires 2L O2 via [place] to maintain SpO2 >90% O2 with  ambulation, bandage on L ankle removed as slight edema around bandage and pt reports it was to come off today. Wound on R medial ankle scabbed over and stable, left bandage off and reported to RN      Pertinent Vitals/Pain Pain Assessment Pain Assessment: Faces Faces Pain Scale: Hurts even more Pain Location: bilateral feet and ankles with walking Pain Descriptors / Indicators: Moaning, Grimacing, Guarding, Discomfort Pain Intervention(s): Limited activity within patient's tolerance, Monitored during session, Repositioned, Other (comment) (placed shoes for further ambulation)     PT Goals (current goals can now be found in the care plan section) Acute Rehab PT Goals Patient Stated Goal: to return home PT Goal Formulation: With patient Time For Goal Achievement: 07/28/22 Potential to Achieve Goals: Fair Progress towards PT goals: Not progressing toward goals - comment (limited by foot and ankle pain)    Frequency    Min 3X/week      PT Plan Discharge plan needs to be updated       AM-PAC PT "6 Clicks" Mobility   Outcome Measure  Help needed turning from your back to your side while in a flat bed without using bedrails?: A Little Help needed moving from lying on your back to sitting on the side of a flat bed without using bedrails?: A Little Help needed moving to and from a bed to a chair (including a wheelchair)?: A Little Help needed standing up from a chair using your arms (e.g., wheelchair or bedside chair)?: A Little Help needed to walk in hospital room?: A Little Help needed climbing 3-5 steps with a railing? : A Lot (with single rail) 6 Click Score: 17    End of Session Equipment Utilized During Treatment: Gait belt;Oxygen Activity Tolerance: Patient tolerated treatment well Patient left: in chair;with call bell/phone within reach Nurse Communication: Mobility status (need for O2 with ambulation) PT Visit Diagnosis: Other abnormalities of gait and mobility  (R26.89);Muscle weakness (generalized) (M62.81)     Time: [id number] PT Time Calculation (min) (ACUTE ONLY): 43 min  Charges:  $Gait Training: 8-22 mins $Therapeutic Activity: 23-37 mins                     Shea Kapur B. Migdalia Dk PT, DPT Acute Rehabilitation Services Please use secure chat or  Call Office [phone]    [place] 07/19/2022, 10:37 AM

## 2022-07-19 NOTE — Progress Notes (Signed)
SATURATION QUALIFICATIONS: (This note is used to comply with regulatory documentation for home oxygen)  Patient Saturations on Room Air at Rest = 92%  Patient Saturations on Room Air while Ambulating = 85%  Patient Saturations on 2 Liters of oxygen while Ambulating = 91%  Please briefly explain why patient needs home oxygen:  Pt requires 2L O2 via [place] to maintain SpO2 >90%O2 with mobilization.  Danielle Thomas B. Migdalia Dk PT, DPT Acute Rehabilitation Services Please use secure chat or  Call Office [phone]

## 2022-07-19 NOTE — Progress Notes (Signed)
Cardiology Progress Note  Patient ID: Danielle Thomas MRN: [id number] DOB: 08-23-1927 Date of Encounter: 07/19/2022  Primary Cardiologist: Evalina Field, MD  Subjective   Chief Complaint: SOB  HPI: Euvolemic on exam.  Still short of breath.  Suspect atelectasis.  ROS:  All other ROS reviewed and negative. Pertinent positives noted in the HPI.     Inpatient Medications  Scheduled Meds:  acetaZOLAMIDE  500 mg Oral Once   apixaban  2.5 mg Oral BID   dorzolamide  1 drop Both Eyes BID   empagliflozin  10 mg Oral Daily   furosemide  40 mg Oral Daily   latanoprost  1 drop Both Eyes QHS   losartan  25 mg Oral Daily   nystatin cream   Topical BID   potassium chloride  40 mEq Oral Once   rOPINIRole  0.5 mg Oral BID   sodium chloride flush  3 mL Intravenous Q12H   spironolactone  12.5 mg Oral Daily   Continuous Infusions:  sodium chloride     PRN Meds: sodium chloride, acetaminophen **OR** acetaminophen, melatonin, metoCLOPramide (REGLAN) injection, ondansetron (ZOFRAN) IV, rOPINIRole, senna-docusate, sodium chloride flush   Vital Signs   Vitals:   07/18/22 2347 07/19/22 0405 07/19/22 0642 07/19/22 0752  BP: 138/65 (!) 141/71  115/61  Pulse: 68 70  84  Resp: 20 19  (!) 24  Temp: 98.1 F (36.7 C) 98.3 F (36.8 C)  97.9 F (36.6 C)  TempSrc: Oral Oral  Oral  SpO2: 100% 99%  91%  Weight:   57.6 kg   Height:        Intake/Output Summary (Last 24 hours) at 07/19/2022 0900 Last data filed at 07/19/2022 0752 Gross per 24 hour  Intake 340 ml  Output 1150 ml  Net -810 ml      07/19/2022    6:42 AM 07/18/2022    5:12 AM 07/17/2022    4:41 AM  Last 3 Weights  Weight (lbs) 126 lb 15.8 oz 126 lb 15.8 oz 132 lb 4.4 oz  Weight (kg) 57.6 kg 57.6 kg 60 kg      Telemetry  Overnight telemetry shows A-fib heart rate 60s, which I personally reviewed.     Physical Exam   Vitals:   07/18/22 2347 07/19/22 0405 07/19/22 0642 07/19/22 0752  BP: 138/65 (!) 141/71  115/61   Pulse: 68 70  84  Resp: 20 19  (!) 24  Temp: 98.1 F (36.7 C) 98.3 F (36.8 C)  97.9 F (36.6 C)  TempSrc: Oral Oral  Oral  SpO2: 100% 99%  91%  Weight:   57.6 kg   Height:        Intake/Output Summary (Last 24 hours) at 07/19/2022 0900 Last data filed at 07/19/2022 0752 Gross per 24 hour  Intake 340 ml  Output 1150 ml  Net -810 ml       07/19/2022    6:42 AM 07/18/2022    5:12 AM 07/17/2022    4:41 AM  Last 3 Weights  Weight (lbs) 126 lb 15.8 oz 126 lb 15.8 oz 132 lb 4.4 oz  Weight (kg) 57.6 kg 57.6 kg 60 kg    Body mass index is 22.49 kg/m.  General: Well nourished, well developed, in no acute distress Head: Atraumatic, normal size  Eyes: PEERLA, EOMI  Neck: Supple, no JVD Endocrine: No thryomegaly Cardiac: Normal S1, S2; irregular rhythm, no murmurs Lungs: Diminished breath sounds bilaterally Abd: Soft, nontender, no hepatomegaly  Ext: No edema, pulses  2+ Musculoskeletal: No deformities, BUE and BLE strength normal and equal Skin: Warm and dry, no rashes   Neuro: Alert and oriented to person, place, time, and situation, CNII-XII grossly intact, no focal deficits  Psych: Normal mood and affect   Labs  High Sensitivity Troponin:   Recent Labs  Lab 07/13/22 1845 07/13/22 2030  TROPONINIHS 19* 21*     Cardiac EnzymesNo results for input(s): "TROPONINI" in the last 168 hours. No results for input(s): "TROPIPOC" in the last 168 hours.  Chemistry Recent Labs  Lab 07/17/22 0021 07/18/22 0019 07/19/22 0014  NA 139 139 139  K 4.1 3.2* 3.4*  CL 96* 90* 88*  CO2 33* 40* 39*  GLUCOSE 125* 112* 110*  BUN 26* 21 23  CREATININE 1.02* 0.86 0.87  CALCIUM 8.8* 8.7* 8.6*  GFRNONAA 52* >60 >60  ANIONGAP '10 9 12    '$ Hematology Recent Labs  Lab 07/15/22 0026 07/16/22 0017 07/17/22 0021  WBC 7.0 7.0 9.6  RBC 4.28 4.35 4.31  HGB 12.0 11.8* 12.1  HCT 36.7 38.6 38.0  MCV 85.7 88.7 88.2  MCH 28.0 27.1 28.1  MCHC 32.7 30.6 31.8  RDW 17.1* 17.1* 16.5*  PLT 212 221  188   BNP Recent Labs  Lab 07/13/22 1845  BNP 1,998.9*    DDimer No results for input(s): "DDIMER" in the last 168 hours.   Radiology  No results found.  Cardiac Studies  TTE 07/14/2022  1. Left ventricular ejection fraction, by estimation, is 45 to 50%. The  left ventricle has mildly decreased function. The left ventricle  demonstrates global hypokinesis. Left ventricular diastolic parameters are  indeterminate.   2. Right ventricular systolic function is moderately reduced. The right  ventricular size is mildly enlarged. There is severely elevated pulmonary  artery systolic pressure. The estimated right ventricular systolic  pressure is 99991111 mmHg.   3. Left atrial size was severely dilated.   4. Right atrial size was severely dilated.   5. The mitral valve is normal in structure. Mild mitral valve  regurgitation. No evidence of mitral stenosis.   6. The aortic valve is tricuspid. There is mild calcification of the  aortic valve. Aortic valve regurgitation is trivial. No aortic stenosis is  present.   7. The inferior vena cava is dilated in size with <50% respiratory  variability, suggesting right atrial pressure of 15 mmHg.   8. The patient was in atrial fibrillation.   Patient Profile  89 year old female with history of permanent atrial fibrillation, hypertension, stroke who was admitted on 07/13/2022 for acute hypoxic respiratory failure secondary to new onset systolic heart failure.   Assessment & Plan   # Acute hypoxic respiratory failure # New onset systolic heart failure, EF 45% # RV dysfunction/pulm hypertension -Conservative approach given advanced age.  Euvolemic on exam.  Hold further IV diuresis.  Transition to 40 mg p.o. daily. -Continue losartan 25 mg daily.  Continue Aldactone 12.5 mg daily. -No beta-blocker due to A-fib with SVR.  Given her advanced age we will pursue a conservative approach to her cardiovascular care.  # Permanent A-fib # Sick sinus  syndrome -Had A-fib with SVR with beta-blocker.  Would hold this.  Clearly has an element of sinus node dysfunction. -Given advanced age we will hold beta-blocker.  She is controlled without medications. -On Eliquis 2.5 mg twice daily.  # AKI -Improved.   [place] will sign off.   Medication Recommendations: As above Other recommendations (labs, testing, etc): None Follow up  as an outpatient: 2 to 3 weeks with me  For questions or updates, please contact [place] Please consult [email or web address] for contact info under        Signed, [place] T. Audie Box, MD, [place]  07/19/2022 9:00 AM

## 2022-07-19 NOTE — TOC Progression Note (Signed)
Transition of Care [facility]) - Progression Note    Patient Details  Name: Danielle Thomas MRN: [id number] Date of Birth: 12-27-1927  Transition of Care [facility]) CM/SW [place], [place] Phone Number: 07/19/2022, 11:25 AM  Clinical Narrative:    CSW notified pt had a decline today with PT, changed recommendation to SNF. CSW noting pt has been intermittently confused, spoke with dtr [name]. Dtr states pt has been to 2 facilities at different times and has had bad experiences at both, she states pt will not go. Dtr confirms pt is from home alone and has [facility] for [facility]. Pt uses O2 at home. Dtr states her and her sister live nearby and will work on getting support into the home. Pt has a WC, Walker, and canes. Her bathroom is accessible, and she has been able to utilize it. Dtr will transport home at DC.  CSW updated medical team. [place] resumption orders will need to be placed at DC. TOC will continue to follow.    Expected Discharge Plan: [place] Barriers to Discharge: Continued Medical Work up  Expected Discharge Plan and Services     Post Acute Care Choice: [place] arrangements for the past 2 months: Single Family Home                                       Social Determinants of Health (SDOH) Interventions SDOH Screenings   Tobacco Use: Low Risk  (07/13/2022)    Readmission Risk Interventions     No data to display

## 2022-07-20 ENCOUNTER — Other Ambulatory Visit (HOSPITAL_COMMUNITY): Payer: Self-pay

## 2022-07-20 DIAGNOSIS — I4811 Longstanding persistent atrial fibrillation: Secondary | ICD-10-CM | POA: Diagnosis not present

## 2022-07-20 LAB — BASIC METABOLIC PANEL
Anion gap: 11 (ref 5–15)
BUN: 28 mg/dL — ABNORMAL HIGH (ref 8–23)
CO2: 32 mmol/L (ref 22–32)
Calcium: 8.9 mg/dL (ref 8.9–10.3)
Chloride: 92 mmol/L — ABNORMAL LOW (ref 98–111)
Creatinine, Ser: 1.04 mg/dL — ABNORMAL HIGH (ref 0.44–1.00)
GFR, Estimated: 50 mL/min — ABNORMAL LOW (ref >60)
Glucose, Bld: 116 mg/dL — ABNORMAL HIGH (ref 70–99)
Potassium: 3.3 mmol/L — ABNORMAL LOW (ref 3.5–5.1)
Sodium: 135 mmol/L (ref 135–145)

## 2022-07-20 MED ORDER — POTASSIUM CHLORIDE 20 MEQ PO PACK
60.0000 meq | PACK | Freq: Once | ORAL | Status: AC
  Administered 2022-07-20: 60 meq via ORAL
  Filled 2022-07-20: qty 3

## 2022-07-20 MED ORDER — POTASSIUM CHLORIDE CRYS ER 20 MEQ PO TBCR
60.0000 meq | EXTENDED_RELEASE_TABLET | Freq: Once | ORAL | Status: DC
  Filled 2022-07-20: qty 3

## 2022-07-20 MED ORDER — LOSARTAN POTASSIUM 25 MG PO TABS
25.0000 mg | ORAL_TABLET | Freq: Every day | ORAL | 0 refills | Status: DC
  Filled 2022-07-20: qty 30, 30d supply, fill #0

## 2022-07-20 MED ORDER — FUROSEMIDE 40 MG PO TABS
40.0000 mg | ORAL_TABLET | Freq: Every day | ORAL | 0 refills | Status: DC
  Filled 2022-07-20: qty 30, 30d supply, fill #0

## 2022-07-20 MED ORDER — SPIRONOLACTONE 25 MG PO TABS
12.5000 mg | ORAL_TABLET | Freq: Every day | ORAL | 0 refills | Status: DC
  Filled 2022-07-20: qty 15, 30d supply, fill #0

## 2022-07-20 MED ORDER — POTASSIUM CHLORIDE CRYS ER 20 MEQ PO TBCR
20.0000 meq | EXTENDED_RELEASE_TABLET | Freq: Every day | ORAL | 0 refills | Status: DC
  Filled 2022-07-20: qty 30, 30d supply, fill #0

## 2022-07-20 MED ORDER — APIXABAN 2.5 MG PO TABS
2.5000 mg | ORAL_TABLET | Freq: Two times a day (BID) | ORAL | 0 refills | Status: DC
  Filled 2022-07-20: qty 60, 30d supply, fill #0

## 2022-07-20 MED ORDER — EMPAGLIFLOZIN 10 MG PO TABS
10.0000 mg | ORAL_TABLET | Freq: Every day | ORAL | 0 refills | Status: DC
  Filled 2022-07-20: qty 30, 30d supply, fill #0

## 2022-07-20 MED ORDER — POTASSIUM CHLORIDE 20 MEQ PO PACK
60.0000 meq | PACK | Freq: Once | ORAL | Status: DC
  Filled 2022-07-20: qty 3

## 2022-07-20 NOTE — TOC CM/SW Note (Cosign Needed)
See prior [facility] Team note  Updated Kerry Dory with [facility]

## 2022-07-20 NOTE — Discharge Summary (Addendum)
Physician Discharge Summary  Danielle Thomas QIO:[id number] DOB: 10-Nov-1927 DOA: 07/13/2022  PCP: Cari Caraway, MD  Admit date: 07/13/2022 Discharge date: 07/20/2022  Time spent:45 minutes  Recommendations for Outpatient Follow-up:  Cardiology Dr. Audie Box in 2 weeks   Discharge Diagnoses:  Principal Problem:   Atrial fibrillation [facility]) Active Problems:   Acute on chronic diastolic CHF (congestive heart failure) (HCC)   Essential hypertension   Hypokalemia   Compression fracture of T11 vertebra (HCC)   Depression, major, single episode, moderate ([place])   Heart failure ([place])   Discharge Condition: Improved  Diet recommendation: Low-sodium heart healthy  Filed Weights   07/18/22 0512 07/19/22 0642 07/20/22 0537  Weight: 57.6 kg 57.6 kg 56.5 kg    History of present illness:  89/F w/ hypertension, atrial fibrillation, restless leg syndrome, and osteoporosis who presented with dyspnea x 3 days, with lower extremity edema. Patient was evaluated by her primary care provider who found her in atrial fibrillation with RVR and heart failure decompensation.  In ED,  cr 0,91, BNP 1,988, troponin 19 and 21  EKG atrial fibrillation with PVC  Hospital Course:   Atrial fibrillation (HCC) Persistent atrial fibrillation, rate 60 to 70 bpm, -became bradycardic after BB, now stopped -transitioned to apixaban from warfarin   Acute on chronic systolic and diastolic CHF (congestive heart failure) (HCC) Echo with EF 45 to 50%, with global hypokinesis, moderate reduction in RV systolic function,  -3/24 follow up CXR bilateral hilar vascular congestion, small bilateral pleural effusions.  -Diuresed well with IV Lasix, 4.9 L negative, transitioned to oral Lasix, started on Jardiance and Aldactone as well, losartan continued No B blocker due to bradycardia.  -Follow-up with Dr. Davina Poke in 2 weeks   Essential hypertension Hypertensive emergency.  -Improved, meds as above   Hypokalemia AKI,  Hypomagnesemia, contraction metabolic alkalosis.   -repleted   Compression fracture of T11 vertebra ([place]) PT and OT evaluation completed, SNF recommended, patient and family declined this, discharged home with home health services and help from family members   Depression, major, single episode, moderate ([place])   Code Status: DNR  Discharge Exam: Vitals:   07/20/22 0741 07/20/22 1123  BP: (!) 147/75 136/73  Pulse: 76 86  Resp: (!) 25 (!) 24  Temp: 98.6 F (37 C) 98.7 F (37.1 C)  SpO2: 92% 93%   Gen: Awake, Alert, Oriented X 2, extremely hard of hearing HEENT: no JVD Lungs: Good air movement bilaterally, CTAB CVS: S1S2/RRR Abd: soft, Non tender, non distended, BS present Extremities: No edema Skin: no new rashes on exposed skin   Discharge Instructions    Allergies as of 07/20/2022       Reactions   Amlodipine Besylate    Other reaction(s): high dose make feet swell   Codeine Nausea And Vomiting   Meperidine Nausea Only   Metoprolol Succinate [metoprolol]    Other reaction(s): effects her breathing   Oxycodone Nausea And Vomiting        Medication List     STOP taking these medications    amLODipine 5 MG tablet Commonly known as: NORVASC   DULoxetine 30 MG capsule Commonly known as: CYMBALTA   hydrALAZINE 25 MG tablet Commonly known as: APRESOLINE   warfarin 1 MG tablet Commonly known as: COUMADIN       TAKE these medications    acetaminophen 325 MG tablet Commonly known as: TYLENOL Take 650 mg by mouth every 6 (six) hours as needed.   apixaban 2.5 MG Tabs tablet Commonly  known as: ELIQUIS Take 1 tablet (2.5 mg total) by mouth 2 (two) times daily.   dorzolamide 2 % ophthalmic solution Commonly known as: TRUSOPT Place 1 drop into both eyes 2 (two) times daily.   empagliflozin 10 MG Tabs tablet Commonly known as: JARDIANCE Take 1 tablet (10 mg total) by mouth daily.   furosemide 40 MG tablet Commonly known as: LASIX Take 1 tablet (40  mg total) by mouth daily.   latanoprost 0.005 % ophthalmic solution Commonly known as: XALATAN Place 1 drop into both eyes at bedtime.   losartan 25 MG tablet Commonly known as: COZAAR Take 1 tablet (25 mg total) by mouth daily. What changed:  medication strength how much to take   potassium chloride SA 20 MEQ tablet Commonly known as: KLOR-CON M Take 1 tablet (20 mEq total) by mouth daily.   rOPINIRole 0.25 MG tablet Commonly known as: REQUIP Take 1 tablet (0.25 mg total) by mouth daily as needed (restless legs around 10am - home dose). What changed:  when to take this reasons to take this additional instructions   spironolactone 25 MG tablet Commonly known as: ALDACTONE Take 1/2 tablet (12.5 mg total) by mouth daily.       Allergies  Allergen Reactions   Amlodipine Besylate     Other reaction(s): high dose make feet swell   Codeine Nausea And Vomiting   Meperidine Nausea Only   Metoprolol Succinate [Metoprolol]     Other reaction(s): effects her breathing   Oxycodone Nausea And Vomiting      The results of significant diagnostics from this hospitalization (including imaging, microbiology, ancillary and laboratory) are listed below for reference.    Significant Diagnostic Studies: DG CHEST PORT 1 VIEW  Result Date: 07/15/2022 CLINICAL DATA:  Shortness of breath EXAM: PORTABLE CHEST 1 VIEW COMPARISON:  07/13/2022 and prior radiographs FINDINGS: The cardiomediastinal silhouette is unchanged. Pulmonary vascular congestion and bibasilar opacities, LEFT-greater-than-RIGHT are not significantly changed. No new pulmonary opacities are noted. There is no evidence of pneumothorax. Surgical clips overlying the LOWER RIGHT hemithorax and spinal fixation hardware again noted. IMPRESSION: Unchanged appearance of the chest with pulmonary vascular congestion and bibasilar opacities/atelectasis. Electronically Signed   By: Margarette Canada M.D.   On: 07/15/2022 16:34   ECHOCARDIOGRAM  COMPLETE  Result Date: 07/14/2022    ECHOCARDIOGRAM REPORT   Patient Name:   Danielle Thomas Date of Exam: 07/14/2022 Medical Rec #:  [id number]      Height:       63.0 in Accession #:    [id number]     Weight:       160.9 lb Date of Birth:  1927/07/09      BSA:          1.763 m Patient Age:    42 years       BP:           135/100 mmHg Patient Gender: F              HR:           67 bpm. Exam Location:  Inpatient Procedure: 2D Echo, Cardiac Doppler and Color Doppler Indications:    acute diastolic chf  History:        Patient has prior history of Echocardiogram examinations, most                 recent 09/03/2006. Arrythmias:Atrial Fibrillation,  Signs/Symptoms:Shortness of Breath and Edema; Risk                 Factors:Hypertension and Dyslipidemia.  Sonographer:    Delcie Roch RDCS Referring Phys: [phone] DEBBY CROSLEY IMPRESSIONS  1. Left ventricular ejection fraction, by estimation, is 45 to 50%. The left ventricle has mildly decreased function. The left ventricle demonstrates global hypokinesis. Left ventricular diastolic parameters are indeterminate.  2. Right ventricular systolic function is moderately reduced. The right ventricular size is mildly enlarged. There is severely elevated pulmonary artery systolic pressure. The estimated right ventricular systolic pressure is 66.0 mmHg.  3. Left atrial size was severely dilated.  4. Right atrial size was severely dilated.  5. The mitral valve is normal in structure. Mild mitral valve regurgitation. No evidence of mitral stenosis.  6. The aortic valve is tricuspid. There is mild calcification of the aortic valve. Aortic valve regurgitation is trivial. No aortic stenosis is present.  7. The inferior vena cava is dilated in size with <50% respiratory variability, suggesting right atrial pressure of 15 mmHg.  8. The patient was in atrial fibrillation. FINDINGS  Left Ventricle: Left ventricular ejection fraction, by estimation, is 45 to 50%. The left  ventricle has mildly decreased function. The left ventricle demonstrates global hypokinesis. The left ventricular internal cavity size was normal in size. There is  no left ventricular hypertrophy. Left ventricular diastolic parameters are indeterminate. Right Ventricle: The right ventricular size is mildly enlarged. No increase in right ventricular wall thickness. Right ventricular systolic function is moderately reduced. There is severely elevated pulmonary artery systolic pressure. The tricuspid regurgitant velocity is 3.57 m/s, and with an assumed right atrial pressure of 15 mmHg, the estimated right ventricular systolic pressure is 66.0 mmHg. Left Atrium: Left atrial size was severely dilated. Right Atrium: Right atrial size was severely dilated. Pericardium: There is no evidence of pericardial effusion. Mitral Valve: The mitral valve is normal in structure. There is mild calcification of the mitral valve leaflet(s). Mild to moderate mitral annular calcification. Mild mitral valve regurgitation. No evidence of mitral valve stenosis. Tricuspid Valve: The tricuspid valve is normal in structure. Tricuspid valve regurgitation is mild. Aortic Valve: The aortic valve is tricuspid. There is mild calcification of the aortic valve. Aortic valve regurgitation is trivial. No aortic stenosis is present. Pulmonic Valve: The pulmonic valve was normal in structure. Pulmonic valve regurgitation is not visualized. Aorta: The aortic root is normal in size and structure. Venous: The inferior vena cava is dilated in size with less than 50% respiratory variability, suggesting right atrial pressure of 15 mmHg. IAS/Shunts: No atrial level shunt detected by color flow Doppler.  LEFT VENTRICLE PLAX 2D LVIDd:         4.70 cm LVIDs:         3.60 cm LV PW:         0.90 cm LV IVS:        1.25 cm  RIGHT VENTRICLE          IVC RV Basal diam:  3.60 cm  IVC diam: 2.20 cm LEFT ATRIUM              Index        RIGHT ATRIUM           Index LA  diam:        4.50 cm  2.55 cm/m   RA Area:     32.40 cm LA Vol (A2C):   109.0 ml 61.82 ml/m  RA Volume:  114.00 ml 64.66 ml/m LA Vol (A4C):   70.3 ml  39.87 ml/m LA Biplane Vol: 91.1 ml  51.67 ml/m   AORTA Ao Root diam: 3.50 cm Ao Asc diam:  3.40 cm MITRAL VALVE               TRICUSPID VALVE MV Area (PHT): 4.71 cm    TR Peak grad:   51.0 mmHg MV Decel Time: 161 msec    TR Vmax:        357.00 cm/s MV E velocity: 72.20 cm/s MV A velocity: 24.50 cm/s MV E/A ratio:  2.95 Dalton McleanMD Electronically signed by Franki Monte Signature Date/Time: 07/14/2022/3:41:59 PM    Final     Microbiology: Recent Results (from the past 240 hour(s))  Resp panel by RT-PCR (RSV, Flu A&B, Covid) Anterior Nasal Swab     Status: None   Collection Time: 07/13/22  6:45 PM   Specimen: Anterior Nasal Swab  Result Value Ref Range Status   SARS Coronavirus 2 by RT PCR NEGATIVE NEGATIVE Final   Influenza A by PCR NEGATIVE NEGATIVE Final   Influenza B by PCR NEGATIVE NEGATIVE Final    Comment: (NOTE) The Xpert Xpress SARS-CoV-2/FLU/RSV plus assay is intended as an aid in the diagnosis of influenza from Nasopharyngeal swab specimens and should not be used as a sole basis for treatment. Nasal washings and aspirates are unacceptable for Xpert Xpress SARS-CoV-2/FLU/RSV testing.  Fact Sheet for Patients: EntrepreneurPulse.com.au  Fact Sheet for Healthcare Providers: IncredibleEmployment.be  This test is not yet approved or cleared by the [place] FDA and has been authorized for detection and/or diagnosis of SARS-CoV-2 by FDA under an Emergency Use Authorization (EUA). This EUA will remain in effect (meaning this test can be used) for the duration of the COVID-19 declaration under Section 564(b)(1) of the Act, 21 U.S.C. section 360bbb-3(b)(1), unless the authorization is terminated or revoked.     Resp Syncytial Virus by PCR NEGATIVE NEGATIVE Final    Comment:  (NOTE) Fact Sheet for Patients: EntrepreneurPulse.com.au  Fact Sheet for Healthcare Providers: IncredibleEmployment.be  This test is not yet approved or cleared by the [place] FDA and has been authorized for detection and/or diagnosis of SARS-CoV-2 by FDA under an Emergency Use Authorization (EUA). This EUA will remain in effect (meaning this test can be used) for the duration of the COVID-19 declaration under Section 564(b)(1) of the Act, 21 U.S.C. section 360bbb-3(b)(1), unless the authorization is terminated or revoked.  Performed at [facility], [place] [address], [place], [place] [address]      Labs: Basic Metabolic Panel: Recent Labs  Lab 07/14/22 0329 07/15/22 0026 07/16/22 0017 07/17/22 0021 07/18/22 0019 07/19/22 0014 07/20/22 0022  NA 140 138 138 139 139 139 135  K 3.1* 3.9 4.1 4.1 3.2* 3.4* 3.3*  CL 104 100 102 96* 90* 88* 92*  CO2 '27 27 31 '$ 33* 40* 39* 32  GLUCOSE 105* 114* 113* 125* 112* 110* 116*  BUN 18 22 26* 26* 21 23 28*  CREATININE 0.85 1.02* 1.25* 1.02* 0.86 0.87 1.04*  CALCIUM 8.9 9.0 8.7* 8.8* 8.7* 8.6* 8.9  MG 1.8 1.6* 2.5*  --  1.5* 2.3  --    Liver Function Tests: No results for input(s): "AST", "ALT", "ALKPHOS", "BILITOT", "PROT", "ALBUMIN" in the last 168 hours. No results for input(s): "LIPASE", "AMYLASE" in the last 168 hours. No results for input(s): "AMMONIA" in the last 168 hours. CBC: Recent Labs  Lab 07/13/22 1845 07/14/22 0329 07/15/22  [id number] 07/16/22 0017 07/17/22 0021  WBC 6.4 6.8 7.0 7.0 9.6  NEUTROABS 4.6  --   --   --   --   HGB 12.4 12.2 12.0 11.8* 12.1  HCT 39.5 37.2 36.7 38.6 38.0  MCV 87.8 85.3 85.7 88.7 88.2  PLT 204 179 212 221 188   Cardiac Enzymes: No results for input(s): "CKTOTAL", "CKMB", "CKMBINDEX", "TROPONINI" in the last 168 hours. BNP: BNP (last 3 results) Recent Labs    07/13/22 1845  BNP 1,998.9*    ProBNP (last 3 results) No results for input(s):  "PROBNP" in the last 8760 hours.  CBG: No results for input(s): "GLUCAP" in the last 168 hours.     Signed:  Domenic Polite MD.  [facility] 07/20/2022, 12:21 PM

## 2022-07-20 NOTE — Plan of Care (Signed)
  Problem: Clinical Measurements: Goal: Will remain free from infection Outcome: Progressing Goal: Diagnostic test results will improve Outcome: Progressing Goal: Respiratory complications will improve Outcome: Progressing   Problem: Activity: Goal: Risk for activity intolerance will decrease Outcome: Progressing   Problem: Nutrition: Goal: Adequate nutrition will be maintained Outcome: Progressing   Problem: Pain Managment: Goal: General experience of comfort will improve Outcome: Progressing   Problem: Safety: Goal: Ability to remain free from injury will improve Outcome: Progressing

## 2022-07-20 NOTE — Plan of Care (Signed)

## 2022-07-20 NOTE — Progress Notes (Signed)
Mobility Specialist Progress Note:   07/20/22 1156  Mobility  Activity Ambulated with assistance in room  Level of Assistance Minimal assist, patient does 75% or more  Assistive Device Front wheel walker  Distance Ambulated (ft) 40 ft  Activity Response Tolerated well  $Mobility charge 1 Mobility   Pt in bed willing to walk then sit up in chair. No complaints of pain. MinA to stand then contact guard to ambulate. Left in chair with call bell in reach and all needs met.   Gareth Eagle Shelbylynn Walczyk Mobility Specialist Please contact via [employer] or  Rehab Office at [phone]

## 2022-07-21 DIAGNOSIS — L089 Local infection of the skin and subcutaneous tissue, unspecified: Secondary | ICD-10-CM | POA: Diagnosis not present

## 2022-07-21 DIAGNOSIS — I350 Nonrheumatic aortic (valve) stenosis: Secondary | ICD-10-CM | POA: Diagnosis not present

## 2022-07-21 DIAGNOSIS — G2581 Restless legs syndrome: Secondary | ICD-10-CM | POA: Diagnosis not present

## 2022-07-21 DIAGNOSIS — I10 Essential (primary) hypertension: Secondary | ICD-10-CM | POA: Diagnosis not present

## 2022-07-21 DIAGNOSIS — R600 Localized edema: Secondary | ICD-10-CM | POA: Diagnosis not present

## 2022-07-21 DIAGNOSIS — N3946 Mixed incontinence: Secondary | ICD-10-CM | POA: Diagnosis not present

## 2022-07-21 DIAGNOSIS — I480 Paroxysmal atrial fibrillation: Secondary | ICD-10-CM | POA: Diagnosis not present

## 2022-07-21 DIAGNOSIS — M170 Bilateral primary osteoarthritis of knee: Secondary | ICD-10-CM | POA: Diagnosis not present

## 2022-07-21 DIAGNOSIS — S81802D Unspecified open wound, left lower leg, subsequent encounter: Secondary | ICD-10-CM | POA: Diagnosis not present

## 2022-07-25 DIAGNOSIS — L089 Local infection of the skin and subcutaneous tissue, unspecified: Secondary | ICD-10-CM | POA: Diagnosis not present

## 2022-07-25 DIAGNOSIS — I350 Nonrheumatic aortic (valve) stenosis: Secondary | ICD-10-CM | POA: Diagnosis not present

## 2022-07-25 DIAGNOSIS — G2581 Restless legs syndrome: Secondary | ICD-10-CM | POA: Diagnosis not present

## 2022-07-25 DIAGNOSIS — N3946 Mixed incontinence: Secondary | ICD-10-CM | POA: Diagnosis not present

## 2022-07-25 DIAGNOSIS — R600 Localized edema: Secondary | ICD-10-CM | POA: Diagnosis not present

## 2022-07-25 DIAGNOSIS — S81802D Unspecified open wound, left lower leg, subsequent encounter: Secondary | ICD-10-CM | POA: Diagnosis not present

## 2022-07-25 DIAGNOSIS — I480 Paroxysmal atrial fibrillation: Secondary | ICD-10-CM | POA: Diagnosis not present

## 2022-07-25 DIAGNOSIS — M170 Bilateral primary osteoarthritis of knee: Secondary | ICD-10-CM | POA: Diagnosis not present

## 2022-07-25 DIAGNOSIS — I10 Essential (primary) hypertension: Secondary | ICD-10-CM | POA: Diagnosis not present

## 2022-07-26 DIAGNOSIS — M170 Bilateral primary osteoarthritis of knee: Secondary | ICD-10-CM | POA: Diagnosis not present

## 2022-07-26 DIAGNOSIS — N3946 Mixed incontinence: Secondary | ICD-10-CM | POA: Diagnosis not present

## 2022-07-26 DIAGNOSIS — L089 Local infection of the skin and subcutaneous tissue, unspecified: Secondary | ICD-10-CM | POA: Diagnosis not present

## 2022-07-26 DIAGNOSIS — I10 Essential (primary) hypertension: Secondary | ICD-10-CM | POA: Diagnosis not present

## 2022-07-26 DIAGNOSIS — I480 Paroxysmal atrial fibrillation: Secondary | ICD-10-CM | POA: Diagnosis not present

## 2022-07-26 DIAGNOSIS — G2581 Restless legs syndrome: Secondary | ICD-10-CM | POA: Diagnosis not present

## 2022-07-26 DIAGNOSIS — S81802D Unspecified open wound, left lower leg, subsequent encounter: Secondary | ICD-10-CM | POA: Diagnosis not present

## 2022-07-26 DIAGNOSIS — I350 Nonrheumatic aortic (valve) stenosis: Secondary | ICD-10-CM | POA: Diagnosis not present

## 2022-07-26 DIAGNOSIS — R600 Localized edema: Secondary | ICD-10-CM | POA: Diagnosis not present

## 2022-07-27 DIAGNOSIS — L089 Local infection of the skin and subcutaneous tissue, unspecified: Secondary | ICD-10-CM | POA: Diagnosis not present

## 2022-07-27 DIAGNOSIS — N179 Acute kidney failure, unspecified: Secondary | ICD-10-CM | POA: Diagnosis not present

## 2022-07-27 DIAGNOSIS — I110 Hypertensive heart disease with heart failure: Secondary | ICD-10-CM | POA: Diagnosis not present

## 2022-07-27 DIAGNOSIS — S81802D Unspecified open wound, left lower leg, subsequent encounter: Secondary | ICD-10-CM | POA: Diagnosis not present

## 2022-07-27 DIAGNOSIS — M170 Bilateral primary osteoarthritis of knee: Secondary | ICD-10-CM | POA: Diagnosis not present

## 2022-07-27 DIAGNOSIS — G2581 Restless legs syndrome: Secondary | ICD-10-CM | POA: Diagnosis not present

## 2022-07-27 DIAGNOSIS — Z79899 Other long term (current) drug therapy: Secondary | ICD-10-CM | POA: Diagnosis not present

## 2022-07-27 DIAGNOSIS — I700 Atherosclerosis of aorta: Secondary | ICD-10-CM | POA: Diagnosis not present

## 2022-07-27 DIAGNOSIS — N3946 Mixed incontinence: Secondary | ICD-10-CM | POA: Diagnosis not present

## 2022-07-27 DIAGNOSIS — I350 Nonrheumatic aortic (valve) stenosis: Secondary | ICD-10-CM | POA: Diagnosis not present

## 2022-07-27 DIAGNOSIS — I10 Essential (primary) hypertension: Secondary | ICD-10-CM | POA: Diagnosis not present

## 2022-07-27 DIAGNOSIS — R600 Localized edema: Secondary | ICD-10-CM | POA: Diagnosis not present

## 2022-07-27 DIAGNOSIS — I5033 Acute on chronic diastolic (congestive) heart failure: Secondary | ICD-10-CM | POA: Diagnosis not present

## 2022-07-27 DIAGNOSIS — R5381 Other malaise: Secondary | ICD-10-CM | POA: Diagnosis not present

## 2022-07-27 DIAGNOSIS — M818 Other osteoporosis without current pathological fracture: Secondary | ICD-10-CM | POA: Diagnosis not present

## 2022-07-27 DIAGNOSIS — I480 Paroxysmal atrial fibrillation: Secondary | ICD-10-CM | POA: Diagnosis not present

## 2022-07-27 DIAGNOSIS — Z6822 Body mass index (BMI) 22.0-22.9, adult: Secondary | ICD-10-CM | POA: Diagnosis not present

## 2022-07-30 NOTE — Progress Notes (Signed)
Cardiology Office Note:    Date:  08/08/2022   ID:  Danielle Thomas, DOB 02-26-1928, MRN [id number]  PCP:  Cari Caraway, MD  Cardiologist:  Evalina Field, MD  Electrophysiologist:  None   Referring MD: Cari Caraway, MD   Chief Complaint: hospital follow-up of CHF  History of Present Illness:    Danielle Thomas is a 89 y.o. female with a history of chronic HFmrEF with EF of 45-50% and moderate RV dysfunction on recent Echo in 07/2022, permanent atrial fibrillation on Eliquis, sick sinus syndrome, hypertension, prior CVA, and restless leg syndrome who is followed by Dr. Audie Box and presents today for hospital follow-up of CHF.  Patient was previously seen by Dr. Tamala Julian for paroxysmal atrial fibrillation. Myoview in 2017 showed no evidence of ischemia. She was last seen by Dr. Tamala Julian in 2017 and was not seen by Cardiology again until recent hospitalization. She was recently admitted from 07/13/2022 to 07/20/2022 for acute CHF after presenting with shortness of breat. BNP was elevated at 1,998. High-sensitivity troponin was minimally elevated at 19 >> 21 consistent with demand ischemia. She was noted to be in rate controlled atrial fibrillation on admission.  Echo showed LVEF of 45-50% with global hypokinesis, moderately reduced RV, severe biatrial enlargement, and severely elevated PASP of 66 mmHg. She was diuresed with IV Lasix and started on GDMT. She was initially started on low dose Metoprolol but developed bradycardia with this so it was stopped. Given advanced age, plan was to manage CHF and pulmonary hypertension conservatively. Discharge weight was 124 lbs. She was discharged on Lasix 40mg  daily, Losartan 25mg  daily, Spironolactone 12.5mg  daily, Jardiance 10mg  daily, and Eliquis 2.5mg  twice daily (replaced Coumadin).   Patient presents today for follow-up. Here with daughter. She is doing well. Shortness of breath has improved and is back to baseline. No new or worsening orthopnea (sleeping on  2 pillows which she has done for a long time). PND and edema have resolved since diuresis in the hospital. Weight is stable around 121 lbs on home scales. No chest pain or palpitations. She reports occasional mild liththeadedness with possition changes but no syncope. No abnormal bleeding in urine or stools. Her main complaint is diarrhea and frequent urination. She is getting up to urinary multiple times at night. Diarrhea is not new but is worse since leaving the hospital and she is very worried that it may be due one of the new medications she was started on. Reviewed cardiac medications on epocrates - Lasix, Losartan, and Spironolactone all have diarrhea listed under a common side effect. She had labs checked at PCP's office on 07/28/2022 and Lasix was decreased to 20mg  daily after this (unable to see these lab results so will request a copy be faxed to use).  Past Medical History:  Diagnosis Date   Acute CHF (congestive heart failure) 07/13/2022   Hypertension     Past Surgical History:  Procedure Laterality Date   KNEE SURGERY     LUMBAR PERCUTANEOUS PEDICLE SCREW 2 LEVEL N/A 05/04/2020   Procedure: Thoracic nine to Thoracic twelve percutaneous pedicle screws;  Surgeon: Ashok Pall, MD;  Location: [facility];  Service: Neurosurgery;  Laterality: N/A;   LUNG BIOPSY     PARTIAL HYSTERECTOMY     TONSILLECTOMY AND ADENOIDECTOMY      Current Medications: Current Meds  Medication Sig   acetaminophen (TYLENOL) 500 MG tablet Take 650 mg by mouth every 6 (six) hours as needed.   dorzolamide (TRUSOPT) 2 %  ophthalmic solution Place 1 drop into both eyes 2 (two) times daily.    empagliflozin (JARDIANCE) 10 MG TABS tablet Take 1 tablet (10 mg total) by mouth daily.   latanoprost (XALATAN) 0.005 % ophthalmic solution Place 1 drop into both eyes at bedtime.   rOPINIRole (REQUIP) 0.25 MG tablet Take 1 tablet (0.25 mg total) by mouth daily as needed (restless legs around 10am - home dose). (Patient taking  differently: Take 0.25 mg by mouth 4 (four) times daily as needed (restless leg). Taken 4pm and 8pm and an additional dose in the middle of the night and possibly in the morning.)   [DISCONTINUED] apixaban (ELIQUIS) 2.5 MG TABS tablet Take 1 tablet (2.5 mg total) by mouth 2 (two) times daily.   [DISCONTINUED] furosemide (LASIX) 40 MG tablet Take 1 tablet (40 mg total) by mouth daily. (Patient taking differently: Take 20 mg by mouth daily.)   [DISCONTINUED] losartan (COZAAR) 25 MG tablet Take 1 tablet (25 mg total) by mouth daily.   [DISCONTINUED] potassium chloride SA (KLOR-CON M) 20 MEQ tablet Take 1 tablet (20 mEq total) by mouth daily.   [DISCONTINUED] spironolactone (ALDACTONE) 25 MG tablet Take 1/2 tablet (12.5 mg total) by mouth daily.     Allergies:   Amlodipine besylate, Codeine, Meperidine, Metoprolol succinate [metoprolol], and Oxycodone   Social History   Socioeconomic History   Marital status: Divorced    Spouse name: Not on file   Number of children: Not on file   Years of education: Not on file   Highest education level: Not on file  Occupational History   Not on file  Tobacco Use   Smoking status: Never   Smokeless tobacco: Never  Substance and Sexual Activity   Alcohol use: Not on file   Drug use: Not on file   Sexual activity: Not on file  Other Topics Concern   Not on file  Social History Narrative   Not on file   Social Determinants of Health   Financial Resource Strain: Not on file  Food Insecurity: Not on file  Transportation Needs: Not on file  Physical Activity: Not on file  Stress: Not on file  Social Connections: Not on file     Family History: The patient's family history includes Other in an other family member.  ROS:   Please see the history of present illness.     EKGs/Labs/Other Studies Reviewed:    The following studies were reviewed:  Myoview 07/07/2015: Nuclear stress EF: 50%. There was no ST segment deviation noted during  stress. No T wave inversion was noted during stress. The study is normal. This is a low risk study. The left ventricular ejection fraction is mildly decreased (45-54%). _______________  Echocardiogram 07/14/2022: Impressions:  1. Left ventricular ejection fraction, by estimation, is 45 to 50%. The  left ventricle has mildly decreased function. The left ventricle  demonstrates global hypokinesis. Left ventricular diastolic parameters are  indeterminate.   2. Right ventricular systolic function is moderately reduced. The right  ventricular size is mildly enlarged. There is severely elevated pulmonary  artery systolic pressure. The estimated right ventricular systolic  pressure is 99991111 mmHg.   3. Left atrial size was severely dilated.   4. Right atrial size was severely dilated.   5. The mitral valve is normal in structure. Mild mitral valve  regurgitation. No evidence of mitral stenosis.   6. The aortic valve is tricuspid. There is mild calcification of the  aortic valve. Aortic valve regurgitation is trivial.  No aortic stenosis is  present.   7. The inferior vena cava is dilated in size with <50% respiratory  variability, suggesting right atrial pressure of 15 mmHg.   8. The patient was in atrial fibrillation.    EKG:  EKG not ordered today.  Recent Labs: 07/13/2022: B Natriuretic Peptide 1,998.9 07/17/2022: Hemoglobin 12.1; Platelets 188 07/19/2022: Magnesium 2.3 07/20/2022: BUN 28; Creatinine, Ser 1.04; Potassium 3.3; Sodium 135  Recent Lipid Panel No results found for: "CHOL", "TRIG", "HDL", "CHOLHDL", "VLDL", "LDLCALC", "LDLDIRECT"  Physical Exam:    Vital Signs: BP (!) 140/78   Pulse 69   Ht 5\' 1"  (1.549 m)   Wt 123 lb (55.8 kg)   SpO2 97%   BMI 23.24 kg/m     Wt Readings from Last 3 Encounters:  08/08/22 123 lb (55.8 kg)  07/20/22 124 lb 9 oz (56.5 kg)  06/17/20 160 lb 3.2 oz (72.7 kg)     General: 89 y.o. thin Caucasian female in no acute distress. HEENT:  Normocephalic and atraumatic. Sclera clear. EOMs intact. Neck: Supple.  No JVD. Heart: Irregularly irregular rhythm with normal rate.  No murmurs, gallops, or rubs. Radial and distal pedal pulses 2+ and equal bilaterally. Lungs: No increased work of breathing. Clear to ausculation bilaterally. No wheezes, rhonchi, or rales.  Abdomen: Soft, non-distended, and non-tender to palpation.  Extremities: No lower extremity edema.    Skin: Warm and dry. Neuro: Alert and oriented x3. No focal deficits. Psych: Normal affect. Responds appropriately.   Assessment:    1. HFrEF (heart failure with reduced ejection fraction)   2. RVF (right ventricular failure)   3. Permanent atrial fibrillation   4. Sick sinus syndrome   5. Primary hypertension   6. Diarrhea, unspecified type     Plan:    Chronic HFmrEF RV Dysfunction Patient was recently admitted for new onset CHF. Echo showed LVEF of 45-50% with global hypokinesis, moderately reduced RV, severe biatrial enlargement, and severely elevated PASP of 66 mmHg. She was diuresed. Given advanced age, plan was to manage CHF and pulmonary hypertension conservatively. - Euvolemic on exam.  - Continue Lasix 20mg  daily. Can take an extra dose as needed for weight gain or worsening edema. - Will stop Spironolactone due to patient's concern of frequent urinary and diarrhea. - Will increase Losartan to 50mg  daily since we are stopping Spironolactone.  - Continue Jardiance 10mg  daily.  - No beta-blocker due to prior bradycardia on low dose Metoprolol.  - Discussed the importance of daily weights and sodium/fluid restrictions.  - Patient had labs rechecked at PCP's office on 07/28/2022. Will request a copy of these labs be faxed to [place].  Permanent Atrial Fibrillation Sick Sinus Syndrome Rate controlled. - No AV nodal agents due to bradycardia on low dose beta-blocker.  - Continue anticoagulation with Eliquis 2.5mg  twice daily. Reduced dose due to age and  weight.  Hypertension BP reasonably well controlled given advanced age. 140/78 in office today. - Will stop Spironolactone given patient's concern for frequent urinary and diarrhea.  - Concerned that BP will be higher off Spironolactone so will go ahead and increase Losartan to 50mg  daily.   Diarrhea Patient's biggest complaint today is diarrhea. This is not completely new but she does state it is worse since she left the hospital and is worried that it could be caused by one of her new medications.  Reviewed cardiac medications on epocrates - Lasix, Losartan, and Spironolactone all have diarrhea listed under a common side effect. Looks  like patient was previous on Losartan and did okay with this. Do not want to stop Lasix due to concern for volume overload. Therefore, will stop Spironolactone. Advised patient to follow-up with PCP if diarrhea continues.  Disposition: Follow up in 2-3 months.    Medication Adjustments/Labs and Tests Ordered: Current medicines are reviewed at length with the patient today.  Concerns regarding medicines are outlined above.  No orders of the defined types were placed in this encounter.  Meds ordered this encounter  Medications   apixaban (ELIQUIS) 2.5 MG TABS tablet    Sig: Take 1 tablet (2.5 mg total) by mouth 2 (two) times daily.    Dispense:  60 tablet    Refill:  2   furosemide (LASIX) 20 MG tablet    Sig: Take 1 tablet (20 mg total) by mouth daily. Take tablet (20 mg) by mouth daily. May take extra tablet by mouth if weight gain of 3 lbs overnight, 5 lbs in a week, and/or swelling of lower extremities    Dispense:  90 tablet    Refill:  1    Dose change new Rx   losartan (COZAAR) 50 MG tablet    Sig: Take 1 tablet (50 mg total) by mouth daily.    Dispense:  90 tablet    Refill:  1    Dose change new Rx   potassium chloride SA (KLOR-CON M) 20 MEQ tablet    Sig: Take 1 tablet (20 mEq total) by mouth daily.    Dispense:  90 tablet    Refill:  1     Patient Instructions  Medication Instructions:   STOP Spirolactone INCREASE Losartan to 50 mg daily TAKE Lasix 20 mg daily. May take an extra tablet for weight gain of 3 lbs overnight, 5 lbs in a week and swelling of lower extremities    *If you need a refill on your cardiac medications before your next appointment, please call your pharmacy*  Lab Work: NONE ordered at this time of appointment   If you have labs (blood work) drawn today and your tests are completely normal, you will receive your results only by: [place] (if you have MyChart) OR A paper copy in the mail If you have any lab test that is abnormal or we need to change your treatment, we will call you to review the results.  Testing/Procedures: NONE ordered at this time of appointment   Follow-Up: At [facility], you and your health needs are our priority.  As part of our continuing mission to provide you with exceptional heart care, we have created designated Provider Care Teams.  These Care Teams include your primary Cardiologist (physician) and Advanced Practice Providers (APPs -  Physician Assistants and Nurse Practitioners) who all work together to provide you with the care you need, when you need it.  We recommend signing up for the patient portal called "MyChart".  Sign up information is provided on this After Visit Summary.  MyChart is used to connect with patients for Virtual Visits (Telemedicine).  Patients are able to view lab/test results, encounter notes, upcoming appointments, etc.  Non-urgent messages can be sent to your provider as well.   To learn more about what you can do with MyChart, go to [email or web address].    Your next appointment:   2-3 month(s)  Provider:   Sande Rives, PA-C        Other Instructions Heart Failure Education: Weigh yourself EVERY morning after you go to the bathroom  but before you eat or drink anything. Write this number down in a weight  log/diary. If you gain 3 pounds overnight or 5 pounds in a week, call the office. Take your medicines as prescribed. If you have concerns about your medications, please call us before you stop taking them.  Eat low salt foods--Limit salt (sodium) to 2000 mg per day. This will help prevent your body from holding onto fluid. Read food labels as many processed foods have a lot of sodium, especially canned goods and prepackaged meats. If you would like some assistance choosing low sodium foods, we would be happy to set you up with a nutritionist. Stay as active as you can everyday. Staying active will give you more energy and make your muscles stronger. Start with 5 minutes at a time and work your way up to 30 minutes a day. Break up your activities--do some in the morning and some in the afternoon. Start with 3 days per week and work your way up to 5 days as you can.  If you have chest pain, feel short of breath, dizzy, or lightheaded, STOP. If you don't feel better after a short rest, call 911. If you do feel better, call the office to let us know you have symptoms with exercise. Limit all fluids for the day to less than 2 liters. Fluid includes all drinks, coffee, juice, ice chips, soup, jello, and all other liquids.     Signed, Darreld Mclean, PA-C  08/08/2022 9:06 PM    [place]

## 2022-07-31 DIAGNOSIS — S81802D Unspecified open wound, left lower leg, subsequent encounter: Secondary | ICD-10-CM | POA: Diagnosis not present

## 2022-07-31 DIAGNOSIS — I480 Paroxysmal atrial fibrillation: Secondary | ICD-10-CM | POA: Diagnosis not present

## 2022-07-31 DIAGNOSIS — G2581 Restless legs syndrome: Secondary | ICD-10-CM | POA: Diagnosis not present

## 2022-07-31 DIAGNOSIS — N3946 Mixed incontinence: Secondary | ICD-10-CM | POA: Diagnosis not present

## 2022-07-31 DIAGNOSIS — L089 Local infection of the skin and subcutaneous tissue, unspecified: Secondary | ICD-10-CM | POA: Diagnosis not present

## 2022-07-31 DIAGNOSIS — I350 Nonrheumatic aortic (valve) stenosis: Secondary | ICD-10-CM | POA: Diagnosis not present

## 2022-07-31 DIAGNOSIS — N179 Acute kidney failure, unspecified: Secondary | ICD-10-CM | POA: Diagnosis not present

## 2022-07-31 DIAGNOSIS — M170 Bilateral primary osteoarthritis of knee: Secondary | ICD-10-CM | POA: Diagnosis not present

## 2022-07-31 DIAGNOSIS — R600 Localized edema: Secondary | ICD-10-CM | POA: Diagnosis not present

## 2022-07-31 DIAGNOSIS — I10 Essential (primary) hypertension: Secondary | ICD-10-CM | POA: Diagnosis not present

## 2022-07-31 LAB — BASIC METABOLIC PANEL: EGFR (Non-African Amer.): 34

## 2022-08-03 DIAGNOSIS — I10 Essential (primary) hypertension: Secondary | ICD-10-CM | POA: Diagnosis not present

## 2022-08-03 DIAGNOSIS — S81802D Unspecified open wound, left lower leg, subsequent encounter: Secondary | ICD-10-CM | POA: Diagnosis not present

## 2022-08-03 DIAGNOSIS — M170 Bilateral primary osteoarthritis of knee: Secondary | ICD-10-CM | POA: Diagnosis not present

## 2022-08-03 DIAGNOSIS — I480 Paroxysmal atrial fibrillation: Secondary | ICD-10-CM | POA: Diagnosis not present

## 2022-08-03 DIAGNOSIS — R600 Localized edema: Secondary | ICD-10-CM | POA: Diagnosis not present

## 2022-08-03 DIAGNOSIS — N3946 Mixed incontinence: Secondary | ICD-10-CM | POA: Diagnosis not present

## 2022-08-03 DIAGNOSIS — L089 Local infection of the skin and subcutaneous tissue, unspecified: Secondary | ICD-10-CM | POA: Diagnosis not present

## 2022-08-03 DIAGNOSIS — I350 Nonrheumatic aortic (valve) stenosis: Secondary | ICD-10-CM | POA: Diagnosis not present

## 2022-08-03 DIAGNOSIS — G2581 Restless legs syndrome: Secondary | ICD-10-CM | POA: Diagnosis not present

## 2022-08-08 ENCOUNTER — Ambulatory Visit: Payer: Medicare HMO | Attending: Student | Admitting: Student

## 2022-08-08 ENCOUNTER — Encounter: Payer: Self-pay | Admitting: Student

## 2022-08-08 VITALS — BP 140/78 | HR 69 | Ht 61.0 in | Wt 123.0 lb

## 2022-08-08 DIAGNOSIS — I495 Sick sinus syndrome: Secondary | ICD-10-CM | POA: Diagnosis not present

## 2022-08-08 DIAGNOSIS — I5020 Unspecified systolic (congestive) heart failure: Secondary | ICD-10-CM

## 2022-08-08 DIAGNOSIS — I50810 Right heart failure, unspecified: Secondary | ICD-10-CM

## 2022-08-08 DIAGNOSIS — R197 Diarrhea, unspecified: Secondary | ICD-10-CM | POA: Diagnosis not present

## 2022-08-08 DIAGNOSIS — I4821 Permanent atrial fibrillation: Secondary | ICD-10-CM | POA: Diagnosis not present

## 2022-08-08 DIAGNOSIS — I10 Essential (primary) hypertension: Secondary | ICD-10-CM | POA: Diagnosis not present

## 2022-08-08 MED ORDER — LOSARTAN POTASSIUM 50 MG PO TABS: 50.0000 mg | ORAL_TABLET | Freq: Every day | ORAL | 1 refills | Status: DC

## 2022-08-08 MED ORDER — FUROSEMIDE 20 MG PO TABS: 20.0000 mg | ORAL_TABLET | Freq: Every day | ORAL | 1 refills | Status: DC

## 2022-08-08 MED ORDER — POTASSIUM CHLORIDE CRYS ER 20 MEQ PO TBCR: 20.0000 meq | EXTENDED_RELEASE_TABLET | Freq: Every day | ORAL | 1 refills | Status: DC

## 2022-08-08 MED ORDER — APIXABAN 2.5 MG PO TABS: 2.5000 mg | ORAL_TABLET | Freq: Two times a day (BID) | ORAL | 2 refills | Status: AC

## 2022-08-08 NOTE — Patient Instructions (Signed)
Medication Instructions:   STOP Spirolactone INCREASE Losartan to 50 mg daily TAKE Lasix 20 mg daily. May take an extra tablet for weight gain of 3 lbs overnight, 5 lbs in a week and swelling of lower extremities    *If you need a refill on your cardiac medications before your next appointment, please call your pharmacy*  Lab Work: NONE ordered at this time of appointment   If you have labs (blood work) drawn today and your tests are completely normal, you will receive your results only by: [place] (if you have MyChart) OR A paper copy in the mail If you have any lab test that is abnormal or we need to change your treatment, we will call you to review the results.  Testing/Procedures: NONE ordered at this time of appointment   Follow-Up: At [facility], you and your health needs are our priority.  As part of our continuing mission to provide you with exceptional heart care, we have created designated Provider Care Teams.  These Care Teams include your primary Cardiologist (physician) and Advanced Practice Providers (APPs -  Physician Assistants and Nurse Practitioners) who all work together to provide you with the care you need, when you need it.  We recommend signing up for the patient portal called "MyChart".  Sign up information is provided on this After Visit Summary.  MyChart is used to connect with patients for Virtual Visits (Telemedicine).  Patients are able to view lab/test results, encounter notes, upcoming appointments, etc.  Non-urgent messages can be sent to your provider as well.   To learn more about what you can do with MyChart, go to [email or web address].    Your next appointment:   2-3 month(s)  Provider:   Sande Rives, PA-C        Other Instructions Heart Failure Education: Weigh yourself EVERY morning after you go to the bathroom but before you eat or drink anything. Write this number down in a weight log/diary. If you gain 3 pounds  overnight or 5 pounds in a week, call the office. Take your medicines as prescribed. If you have concerns about your medications, please call us before you stop taking them.  Eat low salt foods--Limit salt (sodium) to 2000 mg per day. This will help prevent your body from holding onto fluid. Read food labels as many processed foods have a lot of sodium, especially canned goods and prepackaged meats. If you would like some assistance choosing low sodium foods, we would be happy to set you up with a nutritionist. Stay as active as you can everyday. Staying active will give you more energy and make your muscles stronger. Start with 5 minutes at a time and work your way up to 30 minutes a day. Break up your activities--do some in the morning and some in the afternoon. Start with 3 days per week and work your way up to 5 days as you can.  If you have chest pain, feel short of breath, dizzy, or lightheaded, STOP. If you don't feel better after a short rest, call 911. If you do feel better, call the office to let us know you have symptoms with exercise. Limit all fluids for the day to less than 2 liters. Fluid includes all drinks, coffee, juice, ice chips, soup, jello, and all other liquids.

## 2022-08-09 DIAGNOSIS — L089 Local infection of the skin and subcutaneous tissue, unspecified: Secondary | ICD-10-CM | POA: Diagnosis not present

## 2022-08-09 DIAGNOSIS — M170 Bilateral primary osteoarthritis of knee: Secondary | ICD-10-CM | POA: Diagnosis not present

## 2022-08-09 DIAGNOSIS — G2581 Restless legs syndrome: Secondary | ICD-10-CM | POA: Diagnosis not present

## 2022-08-09 DIAGNOSIS — I10 Essential (primary) hypertension: Secondary | ICD-10-CM | POA: Diagnosis not present

## 2022-08-09 DIAGNOSIS — I350 Nonrheumatic aortic (valve) stenosis: Secondary | ICD-10-CM | POA: Diagnosis not present

## 2022-08-09 DIAGNOSIS — I480 Paroxysmal atrial fibrillation: Secondary | ICD-10-CM | POA: Diagnosis not present

## 2022-08-09 DIAGNOSIS — S81802D Unspecified open wound, left lower leg, subsequent encounter: Secondary | ICD-10-CM | POA: Diagnosis not present

## 2022-08-09 DIAGNOSIS — R600 Localized edema: Secondary | ICD-10-CM | POA: Diagnosis not present

## 2022-08-09 DIAGNOSIS — N3946 Mixed incontinence: Secondary | ICD-10-CM | POA: Diagnosis not present

## 2022-08-10 DIAGNOSIS — N3946 Mixed incontinence: Secondary | ICD-10-CM | POA: Diagnosis not present

## 2022-08-10 DIAGNOSIS — M170 Bilateral primary osteoarthritis of knee: Secondary | ICD-10-CM | POA: Diagnosis not present

## 2022-08-10 DIAGNOSIS — G2581 Restless legs syndrome: Secondary | ICD-10-CM | POA: Diagnosis not present

## 2022-08-10 DIAGNOSIS — L089 Local infection of the skin and subcutaneous tissue, unspecified: Secondary | ICD-10-CM | POA: Diagnosis not present

## 2022-08-10 DIAGNOSIS — I10 Essential (primary) hypertension: Secondary | ICD-10-CM | POA: Diagnosis not present

## 2022-08-10 DIAGNOSIS — S81802D Unspecified open wound, left lower leg, subsequent encounter: Secondary | ICD-10-CM | POA: Diagnosis not present

## 2022-08-10 DIAGNOSIS — I350 Nonrheumatic aortic (valve) stenosis: Secondary | ICD-10-CM | POA: Diagnosis not present

## 2022-08-10 DIAGNOSIS — R600 Localized edema: Secondary | ICD-10-CM | POA: Diagnosis not present

## 2022-08-10 DIAGNOSIS — I480 Paroxysmal atrial fibrillation: Secondary | ICD-10-CM | POA: Diagnosis not present

## 2022-08-15 ENCOUNTER — Encounter: Payer: Self-pay | Admitting: Cardiovascular Disease

## 2022-08-15 ENCOUNTER — Telehealth: Payer: Self-pay | Admitting: Cardiovascular Disease

## 2022-08-15 MED ORDER — EMPAGLIFLOZIN 10 MG PO TABS: 10.0000 mg | ORAL_TABLET | Freq: Every day | ORAL | 3 refills | Status: DC

## 2022-08-15 MED ORDER — EMPAGLIFLOZIN 10 MG PO TABS: 10.0000 mg | ORAL_TABLET | Freq: Every day | ORAL | 0 refills | Status: AC

## 2022-08-15 NOTE — Telephone Encounter (Signed)
Error

## 2022-08-15 NOTE — Telephone Encounter (Signed)
*  STAT* If patient is at the pharmacy, call can be transferred to refill team.  1. Which medications need to be refilled? (please list name of each medication and dose if known)   empagliflozin (JARDIANCE) 10 MG TABS tablet   2. Which pharmacy/location (including street and city if local pharmacy) is medication to be sent to?  [id number] - [place], [place] - [address]    or  [facility] - [place], [place] - [address]   3. Do they need a 30 day or 90 day supply?   90 day  Patient stated she is out of this medication and is concerned if she still needs to be on this medication.  Patient stated she received her other medication from [facility] yesterday but this medication was not included.

## 2022-08-15 NOTE — Telephone Encounter (Signed)
Called pt to let her know the medication was sent to [facility] for her. Pt states " I only have 2 pills. Do you know how long it will take to get it?" 30 additional pills sent to [facility] pharmacy for pt to pick up today.

## 2022-08-15 NOTE — Addendum Note (Signed)
Addended by: Reynolds Bowl on: 08/15/2022 09:24 AM   Modules accepted: Orders

## 2022-08-29 DIAGNOSIS — Z7901 Long term (current) use of anticoagulants: Secondary | ICD-10-CM | POA: Diagnosis not present

## 2022-08-29 DIAGNOSIS — I480 Paroxysmal atrial fibrillation: Secondary | ICD-10-CM | POA: Diagnosis not present

## 2022-08-29 DIAGNOSIS — I10 Essential (primary) hypertension: Secondary | ICD-10-CM | POA: Diagnosis not present

## 2022-08-29 DIAGNOSIS — M818 Other osteoporosis without current pathological fracture: Secondary | ICD-10-CM | POA: Diagnosis not present

## 2022-08-29 DIAGNOSIS — I700 Atherosclerosis of aorta: Secondary | ICD-10-CM | POA: Diagnosis not present

## 2022-08-29 DIAGNOSIS — I509 Heart failure, unspecified: Secondary | ICD-10-CM | POA: Diagnosis not present

## 2022-08-29 DIAGNOSIS — N3946 Mixed incontinence: Secondary | ICD-10-CM | POA: Diagnosis not present

## 2022-08-29 DIAGNOSIS — I110 Hypertensive heart disease with heart failure: Secondary | ICD-10-CM | POA: Diagnosis not present

## 2022-09-04 DIAGNOSIS — I509 Heart failure, unspecified: Secondary | ICD-10-CM | POA: Diagnosis not present

## 2022-09-04 DIAGNOSIS — Z6821 Body mass index (BMI) 21.0-21.9, adult: Secondary | ICD-10-CM | POA: Diagnosis not present

## 2022-09-04 DIAGNOSIS — R34 Anuria and oliguria: Secondary | ICD-10-CM | POA: Diagnosis not present

## 2022-09-21 DIAGNOSIS — H35032 Hypertensive retinopathy, left eye: Secondary | ICD-10-CM | POA: Diagnosis not present

## 2022-10-23 DIAGNOSIS — H00022 Hordeolum internum right lower eyelid: Secondary | ICD-10-CM | POA: Diagnosis not present

## 2022-11-14 DIAGNOSIS — H00022 Hordeolum internum right lower eyelid: Secondary | ICD-10-CM | POA: Diagnosis not present

## 2022-11-20 ENCOUNTER — Telehealth: Payer: Self-pay | Admitting: Cardiovascular Disease

## 2022-11-20 NOTE — Telephone Encounter (Signed)
Spoke to the patient, she has history of restless leg pt stated the symptom has increased for the past 2-3 months. Pt stated she has frequent urination she is contributing this symptom to Jardiance. Pt stated she has shortness of breath at baseline however, it has not worsen. Pt stated she is unsure which medication is causing her symptoms, she denies UTI symptoms. Will forward to APP and pharm D for advise.

## 2022-11-20 NOTE — Telephone Encounter (Signed)
Pt c/o medication issue:  1. Name of Medication: apixaban (ELIQUIS) 2.5 MG TABS tablet   losartan (COZAAR) 50 MG tablet    empagliflozin (JARDIANCE) 10 MG TABS tablet   2. How are you currently taking this medication (dosage and times per day)?   3. Are you having a reaction (difficulty breathing--STAT)? No  4. What is your medication issue? Pt states that she has been having restless legs and also been going to the bathroom more frequently than normal and is a little concerned it could be coming from the above medications. Please advise.

## 2022-11-21 NOTE — Telephone Encounter (Signed)
Patient aware of pharmD advise to continue taking her jardiance and lasix. She verbalized understanding. All questions answered at this time.

## 2022-11-21 NOTE — Telephone Encounter (Signed)
Agree with PharmD. Thank you!

## 2022-11-21 NOTE — Telephone Encounter (Signed)
It looks like she has a history of restless leg syndrome for which she takes ropinirole. Her cardiac meds should not be worsening this. Her Jardiance and furosemide both have diuretic effects and would cause increased urination. This would not be a reason to discontinue as it's part of how the medication works to keep her out of the hospital and prevent fluid overload. She is taking these for her CHF (hospitalized with CHF exacerbation recently in March) and should continue them.

## 2022-11-27 DIAGNOSIS — I10 Essential (primary) hypertension: Secondary | ICD-10-CM | POA: Diagnosis not present

## 2022-11-27 DIAGNOSIS — I495 Sick sinus syndrome: Secondary | ICD-10-CM | POA: Diagnosis not present

## 2022-11-27 DIAGNOSIS — I50812 Chronic right heart failure: Secondary | ICD-10-CM | POA: Diagnosis not present

## 2022-11-27 DIAGNOSIS — N3946 Mixed incontinence: Secondary | ICD-10-CM | POA: Diagnosis not present

## 2022-11-27 DIAGNOSIS — K30 Functional dyspepsia: Secondary | ICD-10-CM | POA: Diagnosis not present

## 2022-11-27 DIAGNOSIS — E790 Hyperuricemia without signs of inflammatory arthritis and tophaceous disease: Secondary | ICD-10-CM | POA: Diagnosis not present

## 2022-11-27 DIAGNOSIS — I4821 Permanent atrial fibrillation: Secondary | ICD-10-CM | POA: Diagnosis not present

## 2022-11-27 DIAGNOSIS — I5022 Chronic systolic (congestive) heart failure: Secondary | ICD-10-CM | POA: Diagnosis not present

## 2022-11-27 DIAGNOSIS — G2581 Restless legs syndrome: Secondary | ICD-10-CM | POA: Diagnosis not present

## 2022-12-11 DIAGNOSIS — L821 Other seborrheic keratosis: Secondary | ICD-10-CM | POA: Diagnosis not present

## 2022-12-11 DIAGNOSIS — Z85828 Personal history of other malignant neoplasm of skin: Secondary | ICD-10-CM | POA: Diagnosis not present

## 2022-12-18 DIAGNOSIS — G2581 Restless legs syndrome: Secondary | ICD-10-CM | POA: Diagnosis not present

## 2022-12-26 DIAGNOSIS — H401121 Primary open-angle glaucoma, left eye, mild stage: Secondary | ICD-10-CM | POA: Diagnosis not present

## 2022-12-26 DIAGNOSIS — H401113 Primary open-angle glaucoma, right eye, severe stage: Secondary | ICD-10-CM | POA: Diagnosis not present

## 2022-12-26 DIAGNOSIS — H00022 Hordeolum internum right lower eyelid: Secondary | ICD-10-CM | POA: Diagnosis not present

## 2023-01-03 ENCOUNTER — Other Ambulatory Visit: Payer: Self-pay | Admitting: Student

## 2023-01-10 ENCOUNTER — Other Ambulatory Visit: Payer: Self-pay | Admitting: *Deleted

## 2023-01-10 MED ORDER — FUROSEMIDE 20 MG PO TABS: 20.0000 mg | ORAL_TABLET | Freq: Every day | ORAL | 1 refills | Status: DC

## 2023-01-11 DIAGNOSIS — I50812 Chronic right heart failure: Secondary | ICD-10-CM | POA: Diagnosis not present

## 2023-01-11 DIAGNOSIS — Z6820 Body mass index (BMI) 20.0-20.9, adult: Secondary | ICD-10-CM | POA: Diagnosis not present

## 2023-01-11 DIAGNOSIS — G629 Polyneuropathy, unspecified: Secondary | ICD-10-CM | POA: Diagnosis not present

## 2023-01-11 DIAGNOSIS — G2581 Restless legs syndrome: Secondary | ICD-10-CM | POA: Diagnosis not present

## 2023-01-11 DIAGNOSIS — I4821 Permanent atrial fibrillation: Secondary | ICD-10-CM | POA: Diagnosis not present

## 2023-01-11 DIAGNOSIS — R634 Abnormal weight loss: Secondary | ICD-10-CM | POA: Diagnosis not present

## 2023-01-26 ENCOUNTER — Other Ambulatory Visit: Payer: Self-pay | Admitting: Student

## 2023-03-19 DIAGNOSIS — I50812 Chronic right heart failure: Secondary | ICD-10-CM | POA: Diagnosis not present

## 2023-03-19 DIAGNOSIS — I110 Hypertensive heart disease with heart failure: Secondary | ICD-10-CM | POA: Diagnosis not present

## 2023-03-19 DIAGNOSIS — I10 Essential (primary) hypertension: Secondary | ICD-10-CM | POA: Diagnosis not present

## 2023-03-19 DIAGNOSIS — Z79899 Other long term (current) drug therapy: Secondary | ICD-10-CM | POA: Diagnosis not present

## 2023-03-19 DIAGNOSIS — G2581 Restless legs syndrome: Secondary | ICD-10-CM | POA: Diagnosis not present

## 2023-03-19 DIAGNOSIS — Z9989 Dependence on other enabling machines and devices: Secondary | ICD-10-CM | POA: Diagnosis not present

## 2023-03-19 DIAGNOSIS — I495 Sick sinus syndrome: Secondary | ICD-10-CM | POA: Diagnosis not present

## 2023-03-19 DIAGNOSIS — N3946 Mixed incontinence: Secondary | ICD-10-CM | POA: Diagnosis not present

## 2023-03-19 DIAGNOSIS — I4821 Permanent atrial fibrillation: Secondary | ICD-10-CM | POA: Diagnosis not present

## 2023-03-28 ENCOUNTER — Other Ambulatory Visit: Payer: Self-pay

## 2023-03-28 MED ORDER — FUROSEMIDE 20 MG PO TABS: 20.0000 mg | ORAL_TABLET | Freq: Every day | ORAL | 1 refills | Status: AC

## 2023-04-03 DIAGNOSIS — I700 Atherosclerosis of aorta: Secondary | ICD-10-CM | POA: Diagnosis not present

## 2023-04-03 DIAGNOSIS — K5900 Constipation, unspecified: Secondary | ICD-10-CM | POA: Diagnosis not present

## 2023-04-03 DIAGNOSIS — Z6820 Body mass index (BMI) 20.0-20.9, adult: Secondary | ICD-10-CM | POA: Diagnosis not present

## 2023-04-19 DIAGNOSIS — S22000D Wedge compression fracture of unspecified thoracic vertebra, subsequent encounter for fracture with routine healing: Secondary | ICD-10-CM | POA: Diagnosis not present

## 2023-04-22 ENCOUNTER — Emergency Department (HOSPITAL_COMMUNITY): Payer: Medicare HMO

## 2023-04-22 ENCOUNTER — Other Ambulatory Visit: Payer: Self-pay

## 2023-04-22 ENCOUNTER — Inpatient Hospital Stay (HOSPITAL_COMMUNITY)
Admission: EM | Admit: 2023-04-22 | Discharge: 2023-04-26 | DRG: 480 | Disposition: A | Discharge status: Skilled Nursing Facility | Payer: Medicare HMO | Attending: Internal Medicine | Admitting: Internal Medicine

## 2023-04-22 DIAGNOSIS — W19XXXA Unspecified fall, initial encounter: Secondary | ICD-10-CM | POA: Diagnosis not present

## 2023-04-22 DIAGNOSIS — M47811 Spondylosis without myelopathy or radiculopathy, occipito-atlanto-axial region: Secondary | ICD-10-CM | POA: Diagnosis not present

## 2023-04-22 DIAGNOSIS — Z66 Do not resuscitate: Secondary | ICD-10-CM | POA: Diagnosis present

## 2023-04-22 DIAGNOSIS — M898X9 Other specified disorders of bone, unspecified site: Secondary | ICD-10-CM | POA: Diagnosis present

## 2023-04-22 DIAGNOSIS — N179 Acute kidney failure, unspecified: Secondary | ICD-10-CM | POA: Diagnosis not present

## 2023-04-22 DIAGNOSIS — Z7901 Long term (current) use of anticoagulants: Secondary | ICD-10-CM

## 2023-04-22 DIAGNOSIS — W109XXA Fall (on) (from) unspecified stairs and steps, initial encounter: Secondary | ICD-10-CM | POA: Diagnosis present

## 2023-04-22 DIAGNOSIS — I4891 Unspecified atrial fibrillation: Secondary | ICD-10-CM | POA: Diagnosis present

## 2023-04-22 DIAGNOSIS — R000 Tachycardia, unspecified: Secondary | ICD-10-CM | POA: Diagnosis not present

## 2023-04-22 DIAGNOSIS — I482 Chronic atrial fibrillation, unspecified: Secondary | ICD-10-CM | POA: Diagnosis not present

## 2023-04-22 DIAGNOSIS — R0602 Shortness of breath: Secondary | ICD-10-CM | POA: Diagnosis not present

## 2023-04-22 DIAGNOSIS — Z885 Allergy status to narcotic agent status: Secondary | ICD-10-CM

## 2023-04-22 DIAGNOSIS — S7291XA Unspecified fracture of right femur, initial encounter for closed fracture: Secondary | ICD-10-CM

## 2023-04-22 DIAGNOSIS — D509 Iron deficiency anemia, unspecified: Secondary | ICD-10-CM | POA: Diagnosis present

## 2023-04-22 DIAGNOSIS — Z90711 Acquired absence of uterus with remaining cervical stump: Secondary | ICD-10-CM | POA: Diagnosis not present

## 2023-04-22 DIAGNOSIS — R0989 Other specified symptoms and signs involving the circulatory and respiratory systems: Secondary | ICD-10-CM | POA: Diagnosis not present

## 2023-04-22 DIAGNOSIS — Z79899 Other long term (current) drug therapy: Secondary | ICD-10-CM | POA: Diagnosis not present

## 2023-04-22 DIAGNOSIS — I4811 Longstanding persistent atrial fibrillation: Secondary | ICD-10-CM

## 2023-04-22 DIAGNOSIS — G8918 Other acute postprocedural pain: Secondary | ICD-10-CM | POA: Diagnosis not present

## 2023-04-22 DIAGNOSIS — M80051A Age-related osteoporosis with current pathological fracture, right femur, initial encounter for fracture: Principal | ICD-10-CM | POA: Diagnosis present

## 2023-04-22 DIAGNOSIS — Z7401 Bed confinement status: Secondary | ICD-10-CM | POA: Diagnosis not present

## 2023-04-22 DIAGNOSIS — I6782 Cerebral ischemia: Secondary | ICD-10-CM | POA: Diagnosis not present

## 2023-04-22 DIAGNOSIS — Z9889 Other specified postprocedural states: Secondary | ICD-10-CM | POA: Diagnosis not present

## 2023-04-22 DIAGNOSIS — Y92018 Other place in single-family (private) house as the place of occurrence of the external cause: Secondary | ICD-10-CM | POA: Diagnosis not present

## 2023-04-22 DIAGNOSIS — R319 Hematuria, unspecified: Secondary | ICD-10-CM | POA: Diagnosis present

## 2023-04-22 DIAGNOSIS — J9601 Acute respiratory failure with hypoxia: Secondary | ICD-10-CM | POA: Diagnosis not present

## 2023-04-22 DIAGNOSIS — D62 Acute posthemorrhagic anemia: Secondary | ICD-10-CM | POA: Diagnosis not present

## 2023-04-22 DIAGNOSIS — I110 Hypertensive heart disease with heart failure: Secondary | ICD-10-CM | POA: Diagnosis not present

## 2023-04-22 DIAGNOSIS — M810 Age-related osteoporosis without current pathological fracture: Secondary | ICD-10-CM | POA: Diagnosis present

## 2023-04-22 DIAGNOSIS — G2581 Restless legs syndrome: Secondary | ICD-10-CM | POA: Diagnosis present

## 2023-04-22 DIAGNOSIS — E875 Hyperkalemia: Secondary | ICD-10-CM | POA: Diagnosis not present

## 2023-04-22 DIAGNOSIS — Z888 Allergy status to other drugs, medicaments and biological substances status: Secondary | ICD-10-CM | POA: Diagnosis not present

## 2023-04-22 DIAGNOSIS — S72141A Displaced intertrochanteric fracture of right femur, initial encounter for closed fracture: Secondary | ICD-10-CM | POA: Diagnosis not present

## 2023-04-22 DIAGNOSIS — D72829 Elevated white blood cell count, unspecified: Secondary | ICD-10-CM | POA: Diagnosis present

## 2023-04-22 DIAGNOSIS — R600 Localized edema: Secondary | ICD-10-CM | POA: Diagnosis not present

## 2023-04-22 DIAGNOSIS — R5383 Other fatigue: Secondary | ICD-10-CM | POA: Diagnosis not present

## 2023-04-22 DIAGNOSIS — Z7984 Long term (current) use of oral hypoglycemic drugs: Secondary | ICD-10-CM

## 2023-04-22 DIAGNOSIS — S72001D Fracture of unspecified part of neck of right femur, subsequent encounter for closed fracture with routine healing: Secondary | ICD-10-CM | POA: Diagnosis not present

## 2023-04-22 DIAGNOSIS — J811 Chronic pulmonary edema: Secondary | ICD-10-CM | POA: Diagnosis not present

## 2023-04-22 DIAGNOSIS — I10 Essential (primary) hypertension: Secondary | ICD-10-CM | POA: Diagnosis not present

## 2023-04-22 DIAGNOSIS — R001 Bradycardia, unspecified: Secondary | ICD-10-CM | POA: Diagnosis present

## 2023-04-22 DIAGNOSIS — I5043 Acute on chronic combined systolic (congestive) and diastolic (congestive) heart failure: Secondary | ICD-10-CM | POA: Diagnosis not present

## 2023-04-22 DIAGNOSIS — M47812 Spondylosis without myelopathy or radiculopathy, cervical region: Secondary | ICD-10-CM | POA: Diagnosis not present

## 2023-04-22 DIAGNOSIS — S72001A Fracture of unspecified part of neck of right femur, initial encounter for closed fracture: Principal | ICD-10-CM | POA: Insufficient documentation

## 2023-04-22 DIAGNOSIS — I959 Hypotension, unspecified: Secondary | ICD-10-CM | POA: Diagnosis not present

## 2023-04-22 DIAGNOSIS — M5030 Other cervical disc degeneration, unspecified cervical region: Secondary | ICD-10-CM | POA: Diagnosis not present

## 2023-04-22 DIAGNOSIS — I5033 Acute on chronic diastolic (congestive) heart failure: Secondary | ICD-10-CM | POA: Diagnosis not present

## 2023-04-22 DIAGNOSIS — I517 Cardiomegaly: Secondary | ICD-10-CM | POA: Diagnosis not present

## 2023-04-22 DIAGNOSIS — R0902 Hypoxemia: Secondary | ICD-10-CM | POA: Diagnosis not present

## 2023-04-22 DIAGNOSIS — I5042 Chronic combined systolic (congestive) and diastolic (congestive) heart failure: Secondary | ICD-10-CM

## 2023-04-22 HISTORY — DX: Presence of external hearing-aid: Z97.4

## 2023-04-22 LAB — BASIC METABOLIC PANEL
Anion gap: 4 — ABNORMAL LOW (ref 5–15)
BUN: 35 mg/dL — ABNORMAL HIGH (ref 8–23)
CO2: 25 mmol/L (ref 22–32)
Calcium: 7.9 mg/dL — ABNORMAL LOW (ref 8.9–10.3)
Chloride: 107 mmol/L (ref 98–111)
Creatinine, Ser: 1.46 mg/dL — ABNORMAL HIGH (ref 0.44–1.00)
GFR, Estimated: 33 mL/min — ABNORMAL LOW (ref >60)
Glucose, Bld: 150 mg/dL — ABNORMAL HIGH (ref 70–99)
Potassium: 4 mmol/L (ref 3.5–5.1)
Sodium: 136 mmol/L (ref 135–145)

## 2023-04-22 LAB — CBC WITH DIFFERENTIAL/PLATELET
Abs Immature Granulocytes: 0.07 10*3/uL (ref 0.00–0.07)
Basophils Absolute: 0 10*3/uL (ref 0.0–0.1)
Basophils Relative: 0 %
Eosinophils Absolute: 0.2 10*3/uL (ref 0.0–0.5)
Eosinophils Relative: 1 %
HCT: 42.3 % (ref 36.0–46.0)
Hemoglobin: 12.6 g/dL (ref 12.0–15.0)
Immature Granulocytes: 1 %
Lymphocytes Relative: 12 %
Lymphs Abs: 1.2 10*3/uL (ref 0.7–4.0)
MCH: 26.9 pg (ref 26.0–34.0)
MCHC: 29.8 g/dL — ABNORMAL LOW (ref 30.0–36.0)
MCV: 90.4 fL (ref 80.0–100.0)
Monocytes Absolute: 0.5 10*3/uL (ref 0.1–1.0)
Monocytes Relative: 5 %
Neutro Abs: 8.5 10*3/uL — ABNORMAL HIGH (ref 1.7–7.7)
Neutrophils Relative %: 81 %
Platelets: 238 10*3/uL (ref 150–400)
RBC: 4.68 MIL/uL (ref 3.87–5.11)
RDW: 17.9 % — ABNORMAL HIGH (ref 11.5–15.5)
WBC: 10.4 10*3/uL (ref 4.0–10.5)
nRBC: 0 % (ref 0.0–0.2)

## 2023-04-22 LAB — I-STAT ARTERIAL BLOOD GAS, ED
Acid-base deficit: 1 mmol/L (ref 0.0–2.0)
Bicarbonate: 25.2 mmol/L (ref 20.0–28.0)
Calcium, Ion: 1.18 mmol/L (ref 1.15–1.40)
HCT: 40 % (ref 36.0–46.0)
Hemoglobin: 13.6 g/dL (ref 12.0–15.0)
O2 Saturation: 100 %
Potassium: 4.5 mmol/L (ref 3.5–5.1)
Sodium: 138 mmol/L (ref 135–145)
TCO2: 27 mmol/L (ref 22–32)
pCO2 arterial: 47.6 mm[Hg] (ref 32–48)
pH, Arterial: 7.332 — ABNORMAL LOW (ref 7.35–7.45)
pO2, Arterial: 393 mm[Hg] — ABNORMAL HIGH (ref 83–108)

## 2023-04-22 MED ORDER — KETAMINE HCL 50 MG/5ML IJ SOSY
10.0000 mg | PREFILLED_SYRINGE | Freq: Once | INTRAMUSCULAR | Status: AC
  Administered 2023-04-22: 10 mg via INTRAVENOUS

## 2023-04-22 MED ORDER — MUPIROCIN 2 % EX OINT: 1.0000 | TOPICAL_OINTMENT | Freq: Two times a day (BID) | CUTANEOUS | Status: DC

## 2023-04-22 MED ORDER — LACTATED RINGERS IV BOLUS
500.0000 mL | Freq: Once | INTRAVENOUS | Status: AC
  Administered 2023-04-22: 500 mL via INTRAVENOUS

## 2023-04-22 MED ORDER — NALOXONE HCL 0.4 MG/ML IJ SOLN
INTRAMUSCULAR | Status: AC
  Administered 2023-04-22: 0.4 mg
  Filled 2023-04-22: qty 1

## 2023-04-22 MED ORDER — POTASSIUM CHLORIDE CRYS ER 20 MEQ PO TBCR
20.0000 meq | EXTENDED_RELEASE_TABLET | Freq: Every day | ORAL | Status: DC
Start: 2023-04-23 — End: 2023-04-24
  Administered 2023-04-24: 20 meq via ORAL
  Filled 2023-04-22: qty 1

## 2023-04-22 MED ORDER — ACETAMINOPHEN 325 MG PO TABS
650.0000 mg | ORAL_TABLET | Freq: Four times a day (QID) | ORAL | Status: DC | PRN
  Administered 2023-04-24 – 2023-04-25 (×2): 650 mg via ORAL
  Filled 2023-04-22 (×2): qty 2

## 2023-04-22 MED ORDER — FENTANYL CITRATE PF 50 MCG/ML IJ SOSY
50.0000 ug | PREFILLED_SYRINGE | Freq: Once | INTRAMUSCULAR | Status: AC
  Administered 2023-04-22: 50 ug via INTRAVENOUS
  Filled 2023-04-22 (×2): qty 1

## 2023-04-22 MED ORDER — MORPHINE SULFATE (PF) 2 MG/ML IV SOLN
0.5000 mg | INTRAVENOUS | Status: DC | PRN
  Administered 2023-04-25: 0.5 mg via INTRAVENOUS
  Filled 2023-04-22: qty 1

## 2023-04-22 MED ORDER — LOSARTAN POTASSIUM 50 MG PO TABS: 50.0000 mg | ORAL_TABLET | Freq: Every day | ORAL | Status: DC

## 2023-04-22 MED ORDER — ONDANSETRON HCL 4 MG/2ML IJ SOLN
4.0000 mg | Freq: Once | INTRAMUSCULAR | Status: AC
  Administered 2023-04-22: 4 mg via INTRAVENOUS
  Filled 2023-04-22: qty 2

## 2023-04-22 MED ORDER — FUROSEMIDE 20 MG PO TABS: 20.0000 mg | ORAL_TABLET | Freq: Every day | ORAL | Status: DC

## 2023-04-22 MED ORDER — KETAMINE HCL 50 MG/5ML IJ SOSY
5.0000 mg | PREFILLED_SYRINGE | Freq: Once | INTRAMUSCULAR | Status: AC
Start: 2023-04-22 — End: 2023-04-22
  Administered 2023-04-22: 5 mg via INTRAVENOUS
  Filled 2023-04-22: qty 5

## 2023-04-22 MED ORDER — ROPINIROLE HCL 0.5 MG PO TABS
0.2500 mg | ORAL_TABLET | Freq: Two times a day (BID) | ORAL | Status: DC
Start: 2023-04-22 — End: 2023-04-23
  Administered 2023-04-22: 0.25 mg via ORAL
  Filled 2023-04-22 (×2): qty 1

## 2023-04-22 MED ORDER — ROPINIROLE HCL 0.5 MG PO TABS
0.2500 mg | ORAL_TABLET | Freq: Two times a day (BID) | ORAL | Status: DC | PRN
  Administered 2023-04-24 – 2023-04-25 (×3): 0.25 mg via ORAL
  Filled 2023-04-22 (×3): qty 1

## 2023-04-22 MED ORDER — SENNA 8.6 MG PO TABS
1.0000 | ORAL_TABLET | Freq: Two times a day (BID) | ORAL | Status: DC
  Administered 2023-04-22 – 2023-04-26 (×7): 8.6 mg via ORAL
  Filled 2023-04-22 (×7): qty 1

## 2023-04-22 MED ORDER — LIDOCAINE 5 % EX PTCH
1.0000 | MEDICATED_PATCH | CUTANEOUS | Status: DC
  Administered 2023-04-22 – 2023-04-25 (×4): 1 via TRANSDERMAL
  Filled 2023-04-22 (×4): qty 1

## 2023-04-22 MED ORDER — HYDROCODONE-ACETAMINOPHEN 5-325 MG PO TABS
1.0000 | ORAL_TABLET | Freq: Four times a day (QID) | ORAL | Status: DC | PRN
Start: 2023-04-22 — End: 2023-04-26
  Administered 2023-04-22 – 2023-04-23 (×3): 1 via ORAL
  Administered 2023-04-24 – 2023-04-26 (×7): 2 via ORAL
  Filled 2023-04-22 (×2): qty 1
  Filled 2023-04-22 (×3): qty 2
  Filled 2023-04-22: qty 1
  Filled 2023-04-22 (×4): qty 2

## 2023-04-22 MED ORDER — NALOXONE HCL 0.4 MG/ML IJ SOLN: 0.4000 mg | INTRAMUSCULAR | Status: DC | PRN

## 2023-04-22 MED ORDER — ACETAMINOPHEN 10 MG/ML IV SOLN
1000.0000 mg | Freq: Once | INTRAVENOUS | Status: AC
Start: 2023-04-22 — End: 2023-04-22
  Administered 2023-04-22: 1000 mg via INTRAVENOUS
  Filled 2023-04-22: qty 100

## 2023-04-22 NOTE — Assessment & Plan Note (Addendum)
-  secondary to intolerance to IV fentanyl.  Patient had an episode of unresponsiveness with bradycardia and hypoxia with IV fentanyl 50 mg ministration -Continue to wean off NRB with goal O2 > 92%

## 2023-04-22 NOTE — Progress Notes (Signed)
Orthopedic Tech Progress Note Patient Details:  Danielle Thomas April 28, 1928 [id number]  Patient ID: Danielle Thomas, female   DOB: 04-10-1928, 89 y.o.   MRN: [id number] Pt doesn't meet criteria for ohf. Pt must be under 70 to get ohf. Trinna Post 04/22/2023, 8:17 PM

## 2023-04-22 NOTE — Assessment & Plan Note (Addendum)
-  hold Lasix overnight due to AKI -can resume in the morning with improvement in Cr -CXR showed pulmonary congestion. There was questionable pericardial effusion but suspect like malrotation. Pt otherwise hemodynamically stable and asymptomatic from cardiac standpoint.

## 2023-04-22 NOTE — Progress Notes (Signed)
RN called RT to bedside stat. RT at bedside to find ED MD assisting ventilations with ambubag. Pt came to after dose of medication. RT placed pt on NRB w/SpO2 reading 100%. RT left pt on NRB 15L RN notified. RT will continue to monitor pt. RN notified to call RT if needed.

## 2023-04-22 NOTE — ED Triage Notes (Signed)
Pt BIB by [facility] from home for hip injury on right side, lowered to the floor by family. On Eliquis.v/s stable, CBG 127

## 2023-04-22 NOTE — Progress Notes (Signed)
Patient arrived to room. Alert and oriented X4. VSS. Placed on 4L. Call bell within reach. Bed alarm on.

## 2023-04-22 NOTE — ED Notes (Addendum)
Patient had episode of respiratory distress after Fentanyl administration, bradycardiac & dyspneic . Narcan given to reverse distress. BP 140/103 , Respiratory Rate 17. Placed patient on cardiac monitoring. Continuing to manage pain.

## 2023-04-22 NOTE — H&P (Signed)
History and Physical    Patient: Danielle Thomas PPI:[id number] DOB: [birth year] DOA: 04/22/2023 DOS: the patient was seen and examined on 04/22/2023 PCP: Gweneth Dimitri, MD  Patient coming from: Home  Chief Complaint:  Chief Complaint  Patient presents with   Hip Pain   HPI: Danielle Thomas is a 89 y.o. female with medical history significant of hypertension, atrial fibrillation on Eliquis, combined systolic and diastolic heart failure, restless leg syndrome, and osteoporosis following a fall with right hip pain.   Pt was going up the stairs and tripped and went down onto her right hip. She was caught by family and lowered to the ground. She is on Eliquis but did not hit her head or lose consciousness.   She presented to ED in severe pain. She received IV of Fentanyl and was sent to X-ray but was brought back unresponsive, bradycardic and hypoxic. She was bagged at bedside and given a dose of Narcan with improvement in her vitals.   She was otherwise afebrile without any leukocytosis or anemia.  BMP notable for AKI with creatinine of 1.41.  CT head was negative.  Chest x-ray with cardiomegaly with pulmonary edema and questionable pericardial effusion.  Femur x-ray demonstrated comminuted intertrochanteric proximal right femur fracture.  Review of Systems: As mentioned in the history of present illness. All other systems reviewed and are negative. Past Medical History:  Diagnosis Date   Acute CHF (congestive heart failure) (HCC) 07/13/2022   Hypertension    Past Surgical History:  Procedure Laterality Date   KNEE SURGERY     LUMBAR PERCUTANEOUS PEDICLE SCREW 2 LEVEL N/A 05/04/2020   Procedure: Thoracic nine to Thoracic twelve percutaneous pedicle screws;  Surgeon: Coletta Memos, MD;  Location: [facility] OR;  Service: Neurosurgery;  Laterality: N/A;   LUNG BIOPSY     PARTIAL HYSTERECTOMY     TONSILLECTOMY AND ADENOIDECTOMY     Social History:  reports that she has never smoked.  She has never used smokeless tobacco. No history on file for alcohol use and drug use.  Allergies  Allergen Reactions   Amlodipine Besylate     Other reaction(s): high dose make feet swell   Codeine Nausea And Vomiting   Meperidine Nausea Only   Metoprolol Succinate [Metoprolol]     Other reaction(s): effects her breathing   Oxycodone Nausea And Vomiting    Family History  Problem Relation Age of Onset   Other Other        NO HEALTH ISSUES    Prior to Admission medications   Medication Sig Start Date End Date Taking? Authorizing Provider  acetaminophen (TYLENOL) 500 MG tablet Take 650 mg by mouth every 6 (six) hours as needed for mild pain (pain score 1-3).   Yes [provider]  apixaban (ELIQUIS) 2.5 MG TABS tablet Take 1 tablet (2.5 mg total) by mouth 2 (two) times daily. 08/08/22  Yes Marjie Skiff E, PA-C  dorzolamide (TRUSOPT) 2 % ophthalmic solution Place 1 drop into both eyes 2 (two) times daily.    Yes [provider]  empagliflozin (JARDIANCE) 10 MG TABS tablet Take 1 tablet (10 mg total) by mouth daily. 08/15/22  Yes O'Neal, Ronnald Ramp, MD  furosemide (LASIX) 20 MG tablet Take 1 tablet (20 mg total) by mouth daily. Take tablet (20 mg) by mouth daily. May take extra tablet by mouth if weight gain of 3 lbs overnight, 5 lbs in a week, and/or swelling of lower extremities 03/28/23  Yes O'Neal, Ronnald Ramp,  MD  latanoprost (XALATAN) 0.005 % ophthalmic solution Place 1 drop into both eyes at bedtime. 06/17/20  Yes Fargo, Amy E, NP  losartan (COZAAR) 50 MG tablet TAKE 1 TABLET EVERY DAY (NEW DOSE) 01/03/23  Yes Marjie Skiff E, PA-C  potassium chloride SA (KLOR-CON M) 20 MEQ tablet TAKE 1 TABLET EVERY DAY 01/26/23  Yes Marjie Skiff E, PA-C  rOPINIRole (REQUIP) 0.25 MG tablet Take 1 tablet (0.25 mg total) by mouth daily as needed (restless legs around 10am - home dose). Patient taking differently: Take 0.25 mg by mouth 4 (four) times daily as needed  (restless leg). Taken 4pm and 8pm and an additional dose in the middle of the night and possibly in the morning. 06/17/20  Yes Octavia Heir, NP    Physical Exam: Vitals:   04/22/23 1715 04/22/23 1730 04/22/23 1745 04/22/23 1830  BP: (!) 138/95 (!) 141/75 104/87 118/73  Pulse: 74 78 74   Resp: 16 (!) 21 (!) 24 16  SpO2:  93% 97%   Weight:      Height:       Constitutional: NAD, calm, comfortable, thin frail elderly female lying on her left side in bed. Hearing impairment and loss her hearing aids.  Eyes: lids and conjunctivae normal ENMT: Mucous membranes are moist.  Neck: normal, supple Respiratory: clear to auscultation bilaterally, no wheezing, no crackles. Normal respiratory effort. No accessory muscle use.  Cardiovascular: Regular rate and rhythm, no murmurs / rubs / gallops. No extremity edema. 2+ pedal pulses.  Abdomen: no tenderness, Soft Musculoskeletal: no clubbing / cyanosis.  Unable to assess mild rotational shortening due to patient positioning in bed.  Patient also in severe pain with any movement of her right lower extremity although sensation and pulses intact. Skin: no rashes, lesions, ulcers. No induration Neurologic: CN 2-12 grossly intact.   Psychiatric: Normal judgment and insight. Alert and oriented x 3. Normal mood. Data Reviewed:  See HPI  Assessment and Plan: * Closed fracture of right femur, unspecified fracture morphology, initial encounter (HCC) - Comminuted intertrochanteric proximal right femur fracture following mechanical fall -Patient otherwise fully independent at home.  From a cardiac standpoint family states she is able to ambulate on a walker and go up flights of stairs without issues. She does report some orthopnea and needing to sleep on a recline chronically.  -Pt had intolerance to fentanyl in the ED.  She became unresponsive with bradycardia and hypoxia after IV 50 mcg of fentanyl in the ED requiring bagging and narcan. -Will start on low-dose  PRN hydrocodone-acetaminophen for moderate pain, low-dose IV morphine for more severe pain.  Can titrate up as needed with close monitoring of respiratory status. -lidocaine patch  -Anesthesia and nerve block consult placed -Keep n.p.o. past midnight pending Ortho recommendations -took last dose of Eliquis this AM (12/15)-will continue to hold  Atrial fibrillation (HCC) -Holding Eliquis for anticipated right hip fracture -last dose morning of 12/15  Essential hypertension -holding Losartan overnight due to AKI  AKI (acute kidney injury) (HCC) Cr elevated to 1.46  -give 500cc LR bolus  Chronic combined systolic (congestive) and diastolic (congestive) heart failure (HCC) -hold Lasix overnight due to AKI -can resume in the morning with improvement in Cr -CXR showed pulmonary congestion. There was questionable pericardial effusion but suspect like malrotation. Pt otherwise hemodynamically stable and asymptomatic from cardiac standpoint.   Acute hypoxic respiratory failure (HCC) -secondary to intolerance to IV fentanyl.  Patient had an episode of unresponsiveness with bradycardia and hypoxia with  IV fentanyl 50 mg ministration -Continue to wean off NRB with goal O2 > 92%       Advance Care Planning: DNR/DNI  Consults: orthopedic surgery  Family Communication: Unable to reach daughter Vista Deck but was able to update her other daughter Arrie Aran.   Severity of Illness: The appropriate patient status for this patient is INPATIENT. Inpatient status is judged to be reasonable and necessary in order to provide the required intensity of service to ensure the patient's safety. The patient's presenting symptoms, physical exam findings, and initial radiographic and laboratory data in the context of their chronic comorbidities is felt to place them at high risk for further clinical deterioration. Furthermore, it is not anticipated that the patient will be medically stable for  discharge from the hospital within 2 midnights of admission.   * I certify that at the point of admission it is my clinical judgment that the patient will require inpatient hospital care spanning beyond 2 midnights from the point of admission due to high intensity of service, high risk for further deterioration and high frequency of surveillance required.*  Author: Anselm Jungling, DO 04/22/2023 8:31 PM  For on call review [email or web address].

## 2023-04-22 NOTE — Assessment & Plan Note (Signed)
-  holding Losartan overnight due to AKI

## 2023-04-22 NOTE — Assessment & Plan Note (Signed)
-  Holding Eliquis for anticipated right hip fracture -last dose morning of 12/15

## 2023-04-22 NOTE — Assessment & Plan Note (Addendum)
-   Comminuted intertrochanteric proximal right femur fracture following mechanical fall -Patient otherwise fully independent at home.  From a cardiac standpoint family states she is able to ambulate on a walker and go up flights of stairs without issues. She does report some orthopnea and needing to sleep on a recline chronically.  -Pt had intolerance to fentanyl in the ED.  She became unresponsive with bradycardia and hypoxia after IV 50 mcg of fentanyl in the ED requiring bagging and narcan. -Will start on low-dose PRN hydrocodone-acetaminophen for moderate pain, low-dose IV morphine for more severe pain.  Can titrate up as needed with close monitoring of respiratory status. -lidocaine patch  -Anesthesia and nerve block consult placed -Keep n.p.o. past midnight pending Ortho recommendations -took last dose of Eliquis this AM (12/15)-will continue to hold

## 2023-04-22 NOTE — ED Notes (Signed)
Patient unresponsive in Radiology after Fentanyl administration. Horton, DO at bedside.

## 2023-04-22 NOTE — ED Notes (Signed)
Attempted to call 5N x2, unsuccessful

## 2023-04-22 NOTE — Assessment & Plan Note (Signed)
Cr elevated to 1.46  -give 500cc LR bolus

## 2023-04-22 NOTE — ED Provider Notes (Addendum)
[place] EMERGENCY DEPARTMENT AT [facility] Provider Note   CSN: [id number] Arrival date & time: 04/22/23  1428     History  Chief Complaint  Patient presents with   Hip Pain    Danielle Thomas is a 89 y.o. female.  HPI   89 year old female presents emergency department with right hip pain.  She was reportedly going up the stairs when she tripped.  She went down onto her right leg/hip.  She was caught by family and otherwise lowered to the floor.  She is on Eliquis but there was no head injury or loss of consciousness.  She is currently complaining of severe right hip pain.  Denies any back or neck pain.  Home Medications Prior to Admission medications   Medication Sig Start Date End Date Taking? Authorizing Provider  acetaminophen (TYLENOL) 500 MG tablet Take 650 mg by mouth every 6 (six) hours as needed.    [provider]  apixaban (ELIQUIS) 2.5 MG TABS tablet Take 1 tablet (2.5 mg total) by mouth 2 (two) times daily. 08/08/22   Marjie Skiff E, PA-C  dorzolamide (TRUSOPT) 2 % ophthalmic solution Place 1 drop into both eyes 2 (two) times daily.     [provider]  empagliflozin (JARDIANCE) 10 MG TABS tablet Take 1 tablet (10 mg total) by mouth daily. 08/15/22   O'NealRonnald Ramp, MD  furosemide (LASIX) 20 MG tablet Take 1 tablet (20 mg total) by mouth daily. Take tablet (20 mg) by mouth daily. May take extra tablet by mouth if weight gain of 3 lbs overnight, 5 lbs in a week, and/or swelling of lower extremities 03/28/23   O'Neal, Ronnald Ramp, MD  latanoprost (XALATAN) 0.005 % ophthalmic solution Place 1 drop into both eyes at bedtime. 06/17/20   Fargo, Amy E, NP  losartan (COZAAR) 50 MG tablet TAKE 1 TABLET EVERY DAY (NEW DOSE) 01/03/23   Marjie Skiff E, PA-C  potassium chloride SA (KLOR-CON M) 20 MEQ tablet TAKE 1 TABLET EVERY DAY 01/26/23   Marjie Skiff E, PA-C  rOPINIRole (REQUIP) 0.25 MG tablet Take 1 tablet (0.25 mg total) by mouth  daily as needed (restless legs around 10am - home dose). Patient taking differently: Take 0.25 mg by mouth 4 (four) times daily as needed (restless leg). Taken 4pm and 8pm and an additional dose in the middle of the night and possibly in the morning. 06/17/20   Fargo, Amy E, NP      Allergies    Amlodipine besylate, Codeine, Meperidine, Metoprolol succinate [metoprolol], and Oxycodone    Review of Systems   Review of Systems  Constitutional:  Negative for fever.  Respiratory:  Negative for shortness of breath.   Cardiovascular:  Negative for chest pain.  Gastrointestinal:  Negative for abdominal pain, diarrhea and vomiting.  Musculoskeletal:  Negative for back pain and neck pain.       + right hip pain  Skin:  Negative for rash.  Neurological:  Negative for headaches.    Physical Exam Updated Vital Signs BP (!) 143/72 (BP Location: Right Arm)   Pulse 79   Resp 20   SpO2 90%  Physical Exam Vitals and nursing note reviewed.  Constitutional:      Appearance: Normal appearance.     Comments: Patient rolled over onto her left side with a pillow between her legs holding her right hip  HENT:     Head: Normocephalic and atraumatic.     Mouth/Throat:     Mouth:  Mucous membranes are moist.  Eyes:     Pupils: Pupils are equal, round, and reactive to light.  Cardiovascular:     Rate and Rhythm: Normal rate.  Pulmonary:     Effort: Pulmonary effort is normal. No respiratory distress.  Abdominal:     Palpations: Abdomen is soft.     Tenderness: There is no abdominal tenderness.  Musculoskeletal:     Cervical back: No tenderness.     Comments: Tenderness to palpation of the right hip extending down to the mid right femur, otherwise no tenderness to palpation of the right knee, calf or foot.  Both feet are very cold to touch, dopplerable pulses  Skin:    General: Skin is warm.  Neurological:     Mental Status: She is alert and oriented to person, place, and time. Mental status is at  baseline.  Psychiatric:        Mood and Affect: Mood normal.     ED Results / Procedures / Treatments   Labs (all labs ordered are listed, but only abnormal results are displayed) Labs Reviewed - No data to display  EKG None  Radiology No results found.  Procedures .Critical Care  Performed by: Rozelle Logan, DO Authorized by: Rozelle Logan, DO   Critical care provider statement:    Critical care time (minutes):  30   Critical care time was exclusive of:  Separately billable procedures and treating other patients   Critical care was necessary to treat or prevent imminent or life-threatening deterioration of the following conditions:  Respiratory failure   Critical care was time spent personally by me on the following activities:  Development of treatment plan with patient or surrogate, discussions with consultants, evaluation of patient's response to treatment, examination of patient, ordering and review of laboratory studies, ordering and review of radiographic studies, ordering and performing treatments and interventions, pulse oximetry, re-evaluation of patient's condition and review of old charts   I assumed direction of critical care for this patient from another provider in my specialty: no       Medications Ordered in ED Medications  fentaNYL (SUBLIMAZE) injection 50 mcg (has no administration in time range)  ondansetron (ZOFRAN) injection 4 mg (has no administration in time range)    ED Course/ Medical Decision Making/ A&P                                 Medical Decision Making Amount and/or Complexity of Data Reviewed Labs: ordered. Radiology: ordered.  Risk Prescription drug management.   89 year old female presents emergency department with a fall going up stairs.  No head injury or loss of consciousness in regards to her being on Eliquis.  Currently complaining of right hip pain.  Patient is laying on her left side in the bed, holding her right  hip.  Hard to evaluate for any shortening or rotation but she is very tender to palpation of the right hip, pelvis is stable.  X-rays are ordered with pain medicine.  Patient signed out pending x-rays.  Patient was brought back from x-rays unresponsive.  This was after she got a dose of pain medicine.  I was able to palpate a carotid pulse but this was bradycardic.  When we got her hooked up to the monitor she was hypoxic.  Report from the daughter in the room is that she is DNR/DNI.  We began to back the patient, oxygen came  up quickly and her heart rate normalized.  We gave her a dose of Narcan and she woke up, had spontaneous respiration and is now maintaining normal oxygenation.  She is moaning and complaining of pain.  It appears that she is very sensitive to the dose of fentanyl.  I have added on additional workup in light of this event.  Patient is signed out to the oncoming provider and we will reevaluate ideas for pain control.  Daughter at bedside has been updated.      Final Clinical Impression(s) / ED Diagnoses Final diagnoses:  None    Rx / DC Orders ED Discharge Orders     None         Rozelle Logan, DO 04/22/23 1508    Rozelle Logan, DO 04/22/23 1557

## 2023-04-23 ENCOUNTER — Other Ambulatory Visit: Payer: Self-pay

## 2023-04-23 ENCOUNTER — Encounter (HOSPITAL_COMMUNITY): Payer: Self-pay | Admitting: Family Medicine

## 2023-04-23 ENCOUNTER — Inpatient Hospital Stay (HOSPITAL_COMMUNITY): Payer: Medicare HMO

## 2023-04-23 ENCOUNTER — Encounter (HOSPITAL_COMMUNITY)
Admission: EM | Disposition: A | Discharge status: Skilled Nursing Facility | Payer: Self-pay | Source: Home / Self Care | Attending: Internal Medicine

## 2023-04-23 ENCOUNTER — Inpatient Hospital Stay (HOSPITAL_COMMUNITY): Payer: Medicare HMO | Admitting: Certified Registered Nurse Anesthetist

## 2023-04-23 DIAGNOSIS — I10 Essential (primary) hypertension: Secondary | ICD-10-CM | POA: Diagnosis not present

## 2023-04-23 DIAGNOSIS — I4811 Longstanding persistent atrial fibrillation: Secondary | ICD-10-CM | POA: Diagnosis not present

## 2023-04-23 DIAGNOSIS — N179 Acute kidney failure, unspecified: Secondary | ICD-10-CM | POA: Diagnosis not present

## 2023-04-23 DIAGNOSIS — S72141A Displaced intertrochanteric fracture of right femur, initial encounter for closed fracture: Secondary | ICD-10-CM | POA: Diagnosis not present

## 2023-04-23 DIAGNOSIS — S7291XA Unspecified fracture of right femur, initial encounter for closed fracture: Secondary | ICD-10-CM | POA: Diagnosis not present

## 2023-04-23 HISTORY — PX: INTRAMEDULLARY (IM) NAIL INTERTROCHANTERIC: SHX5875

## 2023-04-23 LAB — BASIC METABOLIC PANEL
Anion gap: 9 (ref 5–15)
BUN: 39 mg/dL — ABNORMAL HIGH (ref 8–23)
CO2: 27 mmol/L (ref 22–32)
Calcium: 8.9 mg/dL (ref 8.9–10.3)
Chloride: 103 mmol/L (ref 98–111)
Creatinine, Ser: 1.59 mg/dL — ABNORMAL HIGH (ref 0.44–1.00)
GFR, Estimated: 30 mL/min — ABNORMAL LOW (ref >60)
Glucose, Bld: 88 mg/dL (ref 70–99)
Potassium: 4.7 mmol/L (ref 3.5–5.1)
Sodium: 139 mmol/L (ref 135–145)

## 2023-04-23 LAB — SURGICAL PCR SCREEN
MRSA, PCR: NEGATIVE
Staphylococcus aureus: NEGATIVE

## 2023-04-23 SURGERY — FIXATION, FRACTURE, INTERTROCHANTERIC, WITH INTRAMEDULLARY ROD
Anesthesia: General | Laterality: Right

## 2023-04-23 MED ORDER — DORZOLAMIDE HCL 2 % OP SOLN
1.0000 [drp] | Freq: Two times a day (BID) | OPHTHALMIC | Status: DC
  Administered 2023-04-23 – 2023-04-26 (×6): 1 [drp] via OPHTHALMIC
  Filled 2023-04-23: qty 10

## 2023-04-23 MED ORDER — CEFAZOLIN SODIUM-DEXTROSE 2-4 GM/100ML-% IV SOLN
2.0000 g | INTRAVENOUS | Status: AC
  Administered 2023-04-23: 2 g via INTRAVENOUS

## 2023-04-23 MED ORDER — CEFAZOLIN SODIUM-DEXTROSE 2-4 GM/100ML-% IV SOLN
INTRAVENOUS | Status: AC
  Filled 2023-04-23: qty 100

## 2023-04-23 MED ORDER — LACTATED RINGERS IV SOLN: INTRAVENOUS | Status: DC | PRN

## 2023-04-23 MED ORDER — SUGAMMADEX SODIUM 200 MG/2ML IV SOLN
INTRAVENOUS | Status: DC | PRN
  Administered 2023-04-23: 200 mg via INTRAVENOUS

## 2023-04-23 MED ORDER — ORAL CARE MOUTH RINSE: 15.0000 mL | Freq: Once | OROMUCOSAL | Status: AC

## 2023-04-23 MED ORDER — CHLORHEXIDINE GLUCONATE 4 % EX SOLN: 60.0000 mL | Freq: Once | CUTANEOUS | Status: DC

## 2023-04-23 MED ORDER — METOCLOPRAMIDE HCL 5 MG/ML IJ SOLN: 5.0000 mg | Freq: Three times a day (TID) | INTRAMUSCULAR | Status: DC | PRN

## 2023-04-23 MED ORDER — FENTANYL CITRATE (PF) 100 MCG/2ML IJ SOLN: 50.0000 ug | Freq: Once | INTRAMUSCULAR | Status: AC

## 2023-04-23 MED ORDER — HYDRALAZINE HCL 25 MG PO TABS: 25.0000 mg | ORAL_TABLET | Freq: Four times a day (QID) | ORAL | Status: DC | PRN

## 2023-04-23 MED ORDER — DEXAMETHASONE SODIUM PHOSPHATE 10 MG/ML IJ SOLN
INTRAMUSCULAR | Status: AC
  Filled 2023-04-23: qty 2

## 2023-04-23 MED ORDER — LIDOCAINE 2% (20 MG/ML) 5 ML SYRINGE
INTRAMUSCULAR | Status: DC | PRN
  Administered 2023-04-23: 40 mg via INTRAVENOUS

## 2023-04-23 MED ORDER — ONDANSETRON HCL 4 MG/2ML IJ SOLN
INTRAMUSCULAR | Status: DC | PRN
  Administered 2023-04-23: 4 mg via INTRAVENOUS

## 2023-04-23 MED ORDER — CHLORHEXIDINE GLUCONATE 0.12 % MT SOLN: 15.0000 mL | Freq: Once | OROMUCOSAL | Status: AC

## 2023-04-23 MED ORDER — MENTHOL 3 MG MT LOZG: 1.0000 | LOZENGE | OROMUCOSAL | Status: DC | PRN

## 2023-04-23 MED ORDER — FENTANYL CITRATE (PF) 100 MCG/2ML IJ SOLN
INTRAMUSCULAR | Status: AC
  Filled 2023-04-23: qty 2

## 2023-04-23 MED ORDER — ACETAMINOPHEN 10 MG/ML IV SOLN
INTRAVENOUS | Status: DC | PRN
  Administered 2023-04-23: 1000 mg via INTRAVENOUS

## 2023-04-23 MED ORDER — POVIDONE-IODINE 10 % EX SWAB
2.0000 | Freq: Once | CUTANEOUS | Status: AC
  Administered 2023-04-23: 2 via TOPICAL

## 2023-04-23 MED ORDER — CHLORHEXIDINE GLUCONATE 0.12 % MT SOLN
OROMUCOSAL | Status: AC
  Administered 2023-04-23: 15 mL via OROMUCOSAL
  Filled 2023-04-23: qty 15

## 2023-04-23 MED ORDER — METOCLOPRAMIDE HCL 5 MG PO TABS: 5.0000 mg | ORAL_TABLET | Freq: Three times a day (TID) | ORAL | Status: DC | PRN

## 2023-04-23 MED ORDER — PHENYLEPHRINE HCL-NACL 20-0.9 MG/250ML-% IV SOLN
INTRAVENOUS | Status: DC | PRN
  Administered 2023-04-23: 35 ug/min via INTRAVENOUS

## 2023-04-23 MED ORDER — ONDANSETRON HCL 4 MG/2ML IJ SOLN
INTRAMUSCULAR | Status: AC
  Filled 2023-04-23: qty 4

## 2023-04-23 MED ORDER — FENTANYL CITRATE (PF) 100 MCG/2ML IJ SOLN
INTRAMUSCULAR | Status: AC
  Administered 2023-04-23: 50 ug via INTRAVENOUS
  Filled 2023-04-23: qty 2

## 2023-04-23 MED ORDER — ROCURONIUM BROMIDE 10 MG/ML (PF) SYRINGE
PREFILLED_SYRINGE | INTRAVENOUS | Status: DC | PRN
  Administered 2023-04-23: 40 mg via INTRAVENOUS

## 2023-04-23 MED ORDER — PROPOFOL 10 MG/ML IV BOLUS
INTRAVENOUS | Status: AC
  Filled 2023-04-23: qty 20

## 2023-04-23 MED ORDER — SODIUM CHLORIDE 0.9 % IV SOLN
INTRAVENOUS | Status: DC
Start: 2023-04-23 — End: 2023-05-11

## 2023-04-23 MED ORDER — ONDANSETRON HCL 4 MG/2ML IJ SOLN: 4.0000 mg | Freq: Four times a day (QID) | INTRAMUSCULAR | Status: DC | PRN

## 2023-04-23 MED ORDER — POLYETHYLENE GLYCOL 3350 17 G PO PACK
17.0000 g | PACK | Freq: Two times a day (BID) | ORAL | Status: AC
  Administered 2023-04-23 – 2023-04-24 (×3): 17 g via ORAL
  Filled 2023-04-23 (×3): qty 1

## 2023-04-23 MED ORDER — LIDOCAINE 2% (20 MG/ML) 5 ML SYRINGE
INTRAMUSCULAR | Status: AC
  Filled 2023-04-23: qty 5

## 2023-04-23 MED ORDER — DOCUSATE SODIUM 100 MG PO CAPS
100.0000 mg | ORAL_CAPSULE | Freq: Two times a day (BID) | ORAL | Status: DC
Start: 2023-04-23 — End: 2023-04-26
  Administered 2023-04-23 – 2023-04-26 (×7): 100 mg via ORAL
  Filled 2023-04-23 (×7): qty 1

## 2023-04-23 MED ORDER — PHENYLEPHRINE 80 MCG/ML (10ML) SYRINGE FOR IV PUSH (FOR BLOOD PRESSURE SUPPORT)
PREFILLED_SYRINGE | INTRAVENOUS | Status: AC
  Filled 2023-04-23: qty 10

## 2023-04-23 MED ORDER — PHENYLEPHRINE 80 MCG/ML (10ML) SYRINGE FOR IV PUSH (FOR BLOOD PRESSURE SUPPORT)
PREFILLED_SYRINGE | INTRAVENOUS | Status: DC | PRN
  Administered 2023-04-23: 80 ug via INTRAVENOUS

## 2023-04-23 MED ORDER — FENTANYL CITRATE (PF) 100 MCG/2ML IJ SOLN
25.0000 ug | INTRAMUSCULAR | Status: DC | PRN
Start: 2023-04-23 — End: 2023-04-23
  Administered 2023-04-23 (×2): 25 ug via INTRAVENOUS
  Administered 2023-04-23: 50 ug via INTRAVENOUS

## 2023-04-23 MED ORDER — 0.9 % SODIUM CHLORIDE (POUR BTL) OPTIME
TOPICAL | Status: DC | PRN
  Administered 2023-04-23: 1000 mL

## 2023-04-23 MED ORDER — SODIUM CHLORIDE 0.9 % IV BOLUS: 500.0000 mL | Freq: Once | INTRAVENOUS | Status: DC

## 2023-04-23 MED ORDER — BUPIVACAINE-EPINEPHRINE (PF) 0.5% -1:200000 IJ SOLN
INTRAMUSCULAR | Status: DC | PRN
  Administered 2023-04-23: 25 mL via PERINEURAL

## 2023-04-23 MED ORDER — ROCURONIUM BROMIDE 10 MG/ML (PF) SYRINGE
PREFILLED_SYRINGE | INTRAVENOUS | Status: AC
  Filled 2023-04-23: qty 20

## 2023-04-23 MED ORDER — ONDANSETRON HCL 4 MG PO TABS: 4.0000 mg | ORAL_TABLET | Freq: Four times a day (QID) | ORAL | Status: DC | PRN

## 2023-04-23 MED ORDER — DEXAMETHASONE SODIUM PHOSPHATE 10 MG/ML IJ SOLN
INTRAMUSCULAR | Status: DC | PRN
  Administered 2023-04-23: 8 mg via INTRAVENOUS

## 2023-04-23 MED ORDER — LATANOPROST 0.005 % OP SOLN
1.0000 [drp] | Freq: Every day | OPHTHALMIC | Status: DC
Start: 2023-04-23 — End: 2023-04-26
  Administered 2023-04-23 – 2023-04-25 (×3): 1 [drp] via OPHTHALMIC
  Filled 2023-04-23: qty 2.5

## 2023-04-23 MED ORDER — FENTANYL CITRATE (PF) 250 MCG/5ML IJ SOLN
INTRAMUSCULAR | Status: AC
  Filled 2023-04-23: qty 5

## 2023-04-23 MED ORDER — PROPOFOL 10 MG/ML IV BOLUS
INTRAVENOUS | Status: DC | PRN
  Administered 2023-04-23: 40 mg via INTRAVENOUS

## 2023-04-23 MED ORDER — PHENOL 1.4 % MT LIQD: 1.0000 | OROMUCOSAL | Status: DC | PRN

## 2023-04-23 MED ORDER — TRANEXAMIC ACID-NACL 1000-0.7 MG/100ML-% IV SOLN
1000.0000 mg | Freq: Once | INTRAVENOUS | Status: AC
  Administered 2023-04-23: 1000 mg via INTRAVENOUS
  Filled 2023-04-23: qty 100

## 2023-04-23 SURGICAL SUPPLY — 48 items
BAG COUNTER SPONGE SURGICOUNT (BAG) ×1 IMPLANT
BIT DRILL 4.3MMS DISTAL GRDTED (BIT) IMPLANT
BNDG COHESIVE 6X5 TAN ST LF (GAUZE/BANDAGES/DRESSINGS) IMPLANT
BRUSH SCRUB EZ PLAIN DRY (MISCELLANEOUS) ×2 IMPLANT
COVER PERINEAL POST (MISCELLANEOUS) ×1 IMPLANT
COVER SURGICAL LIGHT HANDLE (MISCELLANEOUS) ×2 IMPLANT
DRAPE C-ARM 42X72 X-RAY (DRAPES) ×1 IMPLANT
DRAPE C-ARMOR (DRAPES) ×1 IMPLANT
DRAPE HALF SHEET 40X57 (DRAPES) IMPLANT
DRAPE INCISE IOBAN 66X45 STRL (DRAPES) ×1 IMPLANT
DRAPE SURG ORHT 6 SPLT 77X108 (DRAPES) IMPLANT
DRAPE U-SHAPE 47X51 STRL (DRAPES) ×1 IMPLANT
DRESSING MEPILEX FLEX 4X4 (GAUZE/BANDAGES/DRESSINGS) ×1 IMPLANT
DRILL 4.3MMS DISTAL GRADUATED (BIT) ×1 IMPLANT
DRSG EMULSION OIL 3X3 NADH (GAUZE/BANDAGES/DRESSINGS) ×1 IMPLANT
DRSG MEPILEX FLEX 4X4 (GAUZE/BANDAGES/DRESSINGS) ×3 IMPLANT
DRSG MEPILEX POST OP 4X8 (GAUZE/BANDAGES/DRESSINGS) ×1 IMPLANT
ELECT REM PT RETURN 9FT ADLT (ELECTROSURGICAL) ×1 IMPLANT
ELECTRODE REM PT RTRN 9FT ADLT (ELECTROSURGICAL) ×1 IMPLANT
GLOVE BIO SURGEON STRL SZ7.5 (GLOVE) ×1 IMPLANT
GLOVE BIO SURGEON STRL SZ8 (GLOVE) ×1 IMPLANT
GLOVE BIOGEL PI IND STRL 7.5 (GLOVE) ×1 IMPLANT
GLOVE BIOGEL PI IND STRL 8 (GLOVE) ×1 IMPLANT
GLOVE SURG ORTHO LTX SZ7.5 (GLOVE) ×2 IMPLANT
GOWN STRL REUS W/ TWL LRG LVL3 (GOWN DISPOSABLE) ×2 IMPLANT
GOWN STRL REUS W/ TWL XL LVL3 (GOWN DISPOSABLE) ×1 IMPLANT
GUIDEPIN VERSANAIL DSP 3.2X444 (ORTHOPEDIC DISPOSABLE SUPPLIES) IMPLANT
GUIDEWIRE BALL NOSE 100CM (WIRE) IMPLANT
HFN RH 130 DEG 9MM X 360MM (Nail) IMPLANT
HIP FRA NAIL LAG SCREW 10.5X90 (Orthopedic Implant) ×1 IMPLANT
KIT BASIN OR (CUSTOM PROCEDURE TRAY) ×1 IMPLANT
KIT TURNOVER KIT B (KITS) ×1 IMPLANT
MANIFOLD NEPTUNE II (INSTRUMENTS) ×1 IMPLANT
NS IRRIG 1000ML POUR BTL (IV SOLUTION) ×1 IMPLANT
PACK GENERAL/GYN (CUSTOM PROCEDURE TRAY) ×1 IMPLANT
PAD ARMBOARD 7.5X6 YLW CONV (MISCELLANEOUS) ×2 IMPLANT
SCREW BONE CORTICAL 5.0X3 (Screw) IMPLANT
SCREW BONE CORTICAL 5.0X36 (Screw) IMPLANT
SCREW LAG HIP FRA NAIL 10.5X90 (Orthopedic Implant) IMPLANT
STAPLER VISISTAT 35W (STAPLE) ×1 IMPLANT
STOCKINETTE IMPERVIOUS LG (DRAPES) IMPLANT
SUT ETHILON 2 0 PSLX (SUTURE) ×1 IMPLANT
SUT VIC AB 0 CT1 27XBRD ANBCTR (SUTURE) ×1 IMPLANT
SUT VIC AB 1 CT1 27XBRD ANBCTR (SUTURE) ×1 IMPLANT
SUT VIC AB 2-0 CT1 TAPERPNT 27 (SUTURE) ×1 IMPLANT
TOWEL GREEN STERILE (TOWEL DISPOSABLE) ×2 IMPLANT
TOWEL GREEN STERILE FF (TOWEL DISPOSABLE) ×1 IMPLANT
WATER STERILE IRR 1000ML POUR (IV SOLUTION) ×1 IMPLANT

## 2023-04-23 NOTE — Consult Note (Addendum)
Reason for Consult:Right hip fx Referring Physician: Marinda Elk Time called: [id number] Time at bedside: 0900   Danielle Thomas is an 89 y.o. female.  HPI: Carolene tripped going up her stairs and sat down hard. She had immediate right hip and knee pain and could not get up. She was brought to the ED where x-rays showed a right hip fx and orthopedic surgery was consulted. She lives at home alone and uses a RW to ambulate.  Past Medical History:  Diagnosis Date   Acute CHF (congestive heart failure) (HCC) 07/13/2022   Hypertension     Past Surgical History:  Procedure Laterality Date   KNEE SURGERY     LUMBAR PERCUTANEOUS PEDICLE SCREW 2 LEVEL N/A 05/04/2020   Procedure: Thoracic nine to Thoracic twelve percutaneous pedicle screws;  Surgeon: Coletta Memos, MD;  Location: [facility] OR;  Service: Neurosurgery;  Laterality: N/A;   LUNG BIOPSY     PARTIAL HYSTERECTOMY     TONSILLECTOMY AND ADENOIDECTOMY      Family History  Problem Relation Age of Onset   Other Other        NO HEALTH ISSUES    Social History:  reports that she has never smoked. She has never used smokeless tobacco. No history on file for alcohol use and drug use.  Allergies:  Allergies  Allergen Reactions   Amlodipine Besylate     Other reaction(s): high dose make feet swell   Codeine Nausea And Vomiting   Meperidine Nausea Only   Metoprolol Succinate [Metoprolol]     Other reaction(s): effects her breathing   Oxycodone Nausea And Vomiting    Medications: I have reviewed the patient's current medications.  Results for orders placed or performed during the hospital encounter of 04/22/23 (from the past 48 hours)  CBC with Differential     Status: Abnormal   Collection Time: 04/22/23  3:55 PM  Result Value Ref Range   WBC 10.4 4.0 - 10.5 K/uL   RBC 4.68 3.87 - 5.11 MIL/uL   Hemoglobin 12.6 12.0 - 15.0 g/dL   HCT 84.6 96.2 - 95.2 %   MCV 90.4 80.0 - 100.0 fL   MCH 26.9 26.0 - 34.0 pg   MCHC 29.8 (L) 30.0 - 36.0  g/dL   RDW 84.1 (H) 32.4 - 40.1 %   Platelets 238 150 - 400 K/uL   nRBC 0.0 0.0 - 0.2 %   Neutrophils Relative % 81 %   Neutro Abs 8.5 (H) 1.7 - 7.7 K/uL   Lymphocytes Relative 12 %   Lymphs Abs 1.2 0.7 - 4.0 K/uL   Monocytes Relative 5 %   Monocytes Absolute 0.5 0.1 - 1.0 K/uL   Eosinophils Relative 1 %   Eosinophils Absolute 0.2 0.0 - 0.5 K/uL   Basophils Relative 0 %   Basophils Absolute 0.0 0.0 - 0.1 K/uL   Immature Granulocytes 1 %   Abs Immature Granulocytes 0.07 0.00 - 0.07 K/uL    Comment: Performed at [facility], [address] [place], [place] [address]  Basic metabolic panel     Status: Abnormal   Collection Time: 04/22/23  3:55 PM  Result Value Ref Range   Sodium 136 135 - 145 mmol/L   Potassium 4.0 3.5 - 5.1 mmol/L   Chloride 107 98 - 111 mmol/L   CO2 25 22 - 32 mmol/L   Glucose, Bld 150 (H) 70 - 99 mg/dL    Comment: Glucose reference range applies only to samples taken  after fasting for at least 8 hours.   BUN 35 (H) 8 - 23 mg/dL   Creatinine, Ser 0.10 (H) 0.44 - 1.00 mg/dL   Calcium 7.9 (L) 8.9 - 10.3 mg/dL   GFR, Estimated 33 (L) >60 mL/min    Comment: (NOTE) Calculated using the CKD-EPI Creatinine Equation (2021)    Anion gap 4 (L) 5 - 15    Comment: Performed at [facility], [address], [place], [place] [address]  I-Stat arterial blood gas, ED     Status: Abnormal   Collection Time: 04/22/23  4:39 PM  Result Value Ref Range   pH, Arterial 7.332 (L) 7.35 - 7.45   pCO2 arterial 47.6 32 - 48 mmHg   pO2, Arterial 393 (H) 83 - 108 mmHg   Bicarbonate 25.2 20.0 - 28.0 mmol/L   TCO2 27 22 - 32 mmol/L   O2 Saturation 100 %   Acid-base deficit 1.0 0.0 - 2.0 mmol/L   Sodium 138 135 - 145 mmol/L   Potassium 4.5 3.5 - 5.1 mmol/L   Calcium, Ion 1.18 1.15 - 1.40 mmol/L   HCT 40.0 36.0 - 46.0 %   Hemoglobin 13.6 12.0 - 15.0 g/dL   Sample type ARTERIAL   Surgical PCR screen     Status: None   Collection Time: 04/22/23 11:04 PM   Specimen:  Nasal Mucosa; Nasal Swab  Result Value Ref Range   MRSA, PCR NEGATIVE NEGATIVE   Staphylococcus aureus NEGATIVE NEGATIVE    Comment: (NOTE) The Xpert SA Assay (FDA approved for NASAL specimens in patients 63 years of age and older), is one component of a comprehensive surveillance program. It is not intended to diagnose infection nor to guide or monitor treatment. Performed at [facility], [address] [place], [place] [address]     CT Head Wo Contrast Result Date: 04/22/2023 CLINICAL DATA:  Altered mental status. EXAM: CT HEAD WITHOUT CONTRAST CT CERVICAL SPINE WITHOUT CONTRAST TECHNIQUE: Multidetector CT imaging of the head and cervical spine was performed following the standard protocol without intravenous contrast. Multiplanar CT image reconstructions of the cervical spine were also generated. RADIATION DOSE REDUCTION: This exam was performed according to the departmental dose-optimization program which includes automated exposure control, adjustment of the mA and/or kV according to patient size and/or use of iterative reconstruction technique. COMPARISON:  None Available. FINDINGS: CT HEAD FINDINGS Brain: There is periventricular white matter decreased attenuation consistent with small vessel ischemic changes. Ventricles, sulci and cisterns are prominent consistent with age related involutional changes. No acute intracranial hemorrhage, mass effect or shift. No hydrocephalus. Vascular: No hyperdense vessel or unexpected calcification. Skull: Normal. Negative for fracture or focal lesion. Sinuses/Orbits: No acute finding. CT CERVICAL SPINE FINDINGS Alignment: Normal. Skull base and vertebrae: No acute fracture. No primary bone lesion or focal pathologic process. Soft tissues and spinal canal: No prevertebral fluid or swelling. No visible canal hematoma. Disc levels: Extensive degenerative disc disease identified at each cervical level. Especially large marginal osteophyte formation  noted at C5-6. There is osteoarthritis at C1-C2. Upper chest: Pleural/parenchymal opacities consistent with scarring in the apices. IMPRESSION: 1. Atrophy and chronic small vessel ischemic changes. No acute intracranial process identified. 2. Degenerative changes of the cervical spine. No acute osseous abnormalities. Electronically Signed   By: Layla Maw M.D.   On: 04/22/2023 18:43   CT Cervical Spine Wo Contrast Result Date: 04/22/2023 CLINICAL DATA:  Altered mental status. EXAM: CT HEAD WITHOUT CONTRAST CT CERVICAL SPINE WITHOUT CONTRAST  TECHNIQUE: Multidetector CT imaging of the head and cervical spine was performed following the standard protocol without intravenous contrast. Multiplanar CT image reconstructions of the cervical spine were also generated. RADIATION DOSE REDUCTION: This exam was performed according to the departmental dose-optimization program which includes automated exposure control, adjustment of the mA and/or kV according to patient size and/or use of iterative reconstruction technique. COMPARISON:  None Available. FINDINGS: CT HEAD FINDINGS Brain: There is periventricular white matter decreased attenuation consistent with small vessel ischemic changes. Ventricles, sulci and cisterns are prominent consistent with age related involutional changes. No acute intracranial hemorrhage, mass effect or shift. No hydrocephalus. Vascular: No hyperdense vessel or unexpected calcification. Skull: Normal. Negative for fracture or focal lesion. Sinuses/Orbits: No acute finding. CT CERVICAL SPINE FINDINGS Alignment: Normal. Skull base and vertebrae: No acute fracture. No primary bone lesion or focal pathologic process. Soft tissues and spinal canal: No prevertebral fluid or swelling. No visible canal hematoma. Disc levels: Extensive degenerative disc disease identified at each cervical level. Especially large marginal osteophyte formation noted at C5-6. There is osteoarthritis at C1-C2. Upper  chest: Pleural/parenchymal opacities consistent with scarring in the apices. IMPRESSION: 1. Atrophy and chronic small vessel ischemic changes. No acute intracranial process identified. 2. Degenerative changes of the cervical spine. No acute osseous abnormalities. Electronically Signed   By: Layla Maw M.D.   On: 04/22/2023 18:43   DG Chest Port 1 View Result Date: 04/22/2023 CLINICAL DATA:  Shortness of breath EXAM: PORTABLE CHEST 1 VIEW COMPARISON:  07/15/2022 FINDINGS: Cardiomegaly. Differential densities along the left cardiac silhouette are new since 07/15/2022. Aortic atherosclerotic calcification. Diffuse bilateral interstitial opacities. Pulmonary vascular congestion. No pleural effusion or pneumothorax. Sutures project over the right lower lung. Thoracolumbar fusion hardware. IMPRESSION: 1. Cardiomegaly with pulmonary edema. 2. Differential densities along the left cardiac silhouette are new since 07/15/2022. This may be related to patient rotation however pericardial effusion is difficult to exclude. Consider CT for further evaluation. Electronically Signed   By: Minerva Fester M.D.   On: 04/22/2023 17:05   DG Pelvis 1-2 Views Result Date: 04/22/2023 CLINICAL DATA:  [id number] Fall 190176 EXAM: PELVIS - 1-2 VIEW COMPARISON:  None Available. FINDINGS: Comminuted intertrochanteric proximal right femur fracture with mild impaction, apex lateral angulation and mild retraction of the lesser trochanter fracture fragment. No evidence of hip dislocation on these frontal views. No focal osseous lesions. IMPRESSION: Comminuted intertrochanteric proximal right femur fracture. Electronically Signed   By: Delbert Phenix M.D.   On: 04/22/2023 16:29   DG Femur Min 2 Views Right Result Date: 04/22/2023 CLINICAL DATA:  Fall EXAM: RIGHT FEMUR 2 VIEWS COMPARISON:  None Available. FINDINGS: Comminuted intertrochanteric proximal right femur fracture with apex lateral angulation and mild impaction. No focal osseous  lesions. No evidence of dislocation at the right hip or right knee on these limited views. IMPRESSION: Comminuted intertrochanteric proximal right femur fracture. Electronically Signed   By: Delbert Phenix M.D.   On: 04/22/2023 16:28    Review of Systems  HENT:  Negative for ear discharge, ear pain, hearing loss and tinnitus.   Eyes:  Negative for photophobia and pain.  Respiratory:  Negative for cough and shortness of breath.   Cardiovascular:  Negative for chest pain.  Gastrointestinal:  Negative for abdominal pain, nausea and vomiting.  Genitourinary:  Negative for dysuria, flank pain, frequency and urgency.  Musculoskeletal:  Positive for arthralgias (Right hip and knee). Negative for back pain, myalgias and neck pain.  Neurological:  Negative for dizziness and  headaches.  Hematological:  Does not bruise/bleed easily.  Psychiatric/Behavioral:  The patient is not nervous/anxious.    Blood pressure (!) 100/53, pulse (!) 52, temperature 98.3 F (36.8 C), temperature source Oral, resp. rate 18, height 5\' 1"  (1.549 m), weight 55.8 kg, SpO2 94%. Physical Exam Constitutional:      General: She is not in acute distress.    Appearance: She is well-developed. She is not diaphoretic.  HENT:     Head: Normocephalic and atraumatic.  Eyes:     General: No scleral icterus.       Right eye: No discharge.        Left eye: No discharge.     Conjunctiva/sclera: Conjunctivae normal.  Cardiovascular:     Rate and Rhythm: Normal rate and regular rhythm.  Pulmonary:     Effort: Pulmonary effort is normal. No respiratory distress.  Musculoskeletal:     Cervical back: Normal range of motion.     Comments: RLE No traumatic wounds, ecchymosis, or rash  Mod TTP hip/thigh/knee  No knee or ankle effusion  Knee stable to varus/ valgus and anterior/posterior stress  Sens DPN, SPN, TN intact  Motor EHL, ext, flex, evers 5/5  DP 1+, PT 0, No significant edema  Skin:    General: Skin is warm and dry.   Neurological:     Mental Status: She is alert.  Psychiatric:        Mood and Affect: Mood normal.        Behavior: Behavior normal.     Assessment/Plan: Right hip fx -- Plan IMN today with Dr. Carola Frost. Please keep NPO. Multiple medical problems including hypertension, atrial fibrillation on Eliquis, combined systolic and diastolic heart failure, restless leg syndrome, and osteoporosis -- per primary service    Freeman Caldron, PA-C Orthopedic Surgery [phone] 04/23/2023, 9:10 AM

## 2023-04-23 NOTE — Anesthesia Procedure Notes (Signed)
Procedure Name: Intubation Date/Time: 04/23/2023 10:31 AM  Performed by: Little Ishikawa, CRNAPre-anesthesia Checklist: Patient identified, Emergency Drugs available, Suction available, Timeout performed and Patient being monitored Patient Re-evaluated:Patient Re-evaluated prior to induction Oxygen Delivery Method: Circle system utilized Preoxygenation: Pre-oxygenation with 100% oxygen Induction Type: IV induction Ventilation: Mask ventilation without difficulty Laryngoscope Size: Mac and 3 Grade View: Grade II Tube type: Oral Tube size: 7.0 mm Number of attempts: 1 Airway Equipment and Method: Stylet Placement Confirmation: ETT inserted through vocal cords under direct vision, positive ETCO2, CO2 detector and breath sounds checked- equal and bilateral Secured at: 20 cm Tube secured with: Tape Dental Injury: Teeth and Oropharynx as per pre-operative assessment

## 2023-04-23 NOTE — Anesthesia Preprocedure Evaluation (Addendum)
Anesthesia Evaluation  Patient identified by MRN, date of birth, ID band Patient awake    Reviewed: Allergy & Precautions, H&P , NPO status , Patient's Chart, lab work & pertinent test results  Airway Mallampati: III  TM Distance: >3 FB Neck ROM: Full    Dental no notable dental hx. (+) Teeth Intact, Dental Advisory Given   Pulmonary neg pulmonary ROS   Pulmonary exam normal breath sounds clear to auscultation       Cardiovascular hypertension, Pt. on medications +CHF  + dysrhythmias Atrial Fibrillation  Rhythm:Regular Rate:Normal     Neuro/Psych    Depression    negative neurological ROS     GI/Hepatic negative GI ROS, Neg liver ROS,,,  Endo/Other  negative endocrine ROS    Renal/GU Renal InsufficiencyRenal disease  negative genitourinary   Musculoskeletal   Abdominal   Peds  Hematology negative hematology ROS (+)   Anesthesia Other Findings   Reproductive/Obstetrics negative OB ROS                             Anesthesia Physical Anesthesia Plan  ASA: 3  Anesthesia Plan: General   Post-op Pain Management: Regional block*   Induction: Intravenous  PONV Risk Score and Plan: 4 or greater and Ondansetron, Dexamethasone and Treatment may vary due to age or medical condition  Airway Management Planned: Oral ETT  Additional Equipment:   Intra-op Plan:   Post-operative Plan: Extubation in OR  Informed Consent: I have reviewed the patients History and Physical, chart, labs and discussed the procedure including the risks, benefits and alternatives for the proposed anesthesia with the patient or authorized representative who has indicated his/her understanding and acceptance.   Patient has DNR.  Discussed DNR with power of attorney and Continue DNR.   Dental advisory given  Plan Discussed with: CRNA  Anesthesia Plan Comments:        Anesthesia Quick Evaluation

## 2023-04-23 NOTE — TOC Initial Note (Addendum)
Transition of Care [facility]) - Initial/Assessment Note    Patient Details  Name: Danielle Thomas MRN: [id number] Date of Birth: 03/19/1928  Transition of Care [facility]) CM/SW Contact:    Lorri Frederick, LCSW Phone Number: 04/23/2023, 1:52 PM  Clinical Narrative:       Pt daughter requesting to see CSW, pt still in OR.  Met with daughter Danielle Thomas for initial assessment.  She had questions about rehab options.  Discussed CIR vs SNF vs HH.  Pt has been to both CIR and SNF in the past, very much hoping to avoid SNF.  CIR would be their first choice.  Pt lives alone but has 3 children all able to assist.  No current services at home.  Discussed PT eval will make initial recommendations for DC plan hopefully tomorrow and will further discuss options.  Medicare choice document provided for daughter to review SNFs.  TOC will continue to follow.             Expected Discharge Plan:  (TBD pending PT eval) Barriers to Discharge: Continued Medical Work up   Patient Goals and CMS Choice   CMS Medicare.gov Compare Post Acute Care list provided to:: Patient Represenative (must comment) Choice offered to / list presented to : Adult Children (daughter Danielle Thomas)      Expected Discharge Plan and Services In-house Referral: Clinical Social Work     Living arrangements for the past 2 months: Single Family Home                                      Prior Living Arrangements/Services Living arrangements for the past 2 months: Single Family Home Lives with:: Self          Need for Family Participation in Patient Care: Yes (Comment) Care giver support system in place?: Yes (comment) Current home services: Other (comment) (no services) Criminal Activity/Legal Involvement Pertinent to Current Situation/Hospitalization: No - Comment as needed  Activities of Daily Living   ADL Screening (condition at time of admission) Independently performs ADLs?: No Does the patient have a NEW difficulty with  bathing/dressing/toileting/self-feeding that is expected to last >3 days?: Yes (Initiates electronic notice to provider for possible OT consult) Does the patient have a NEW difficulty with getting in/out of bed, walking, or climbing stairs that is expected to last >3 days?: Yes (Initiates electronic notice to provider for possible PT consult) Does the patient have a NEW difficulty with communication that is expected to last >3 days?: No Is the patient deaf or have difficulty hearing?: Yes Does the patient have difficulty seeing, even when wearing glasses/contacts?: No Does the patient have difficulty concentrating, remembering, or making decisions?: No  Permission Sought/Granted                  Emotional Assessment   Attitude/Demeanor/Rapport: Unable to Assess Affect (typically observed): Unable to Assess        Admission diagnosis:  Closed right hip fracture [facility]) [S72.001A] Patient Active Problem List   Diagnosis Date Noted   Closed right hip fracture (HCC) 04/22/2023   Acute hypoxic respiratory failure (HCC) 04/22/2023   Closed fracture of right femur, unspecified fracture morphology, initial encounter (HCC) 04/22/2023   Chronic combined systolic (congestive) and diastolic (congestive) heart failure (HCC) 04/22/2023   AKI (acute kidney injury) (HCC) 04/22/2023   Heart failure (HCC) 07/14/2022   Acute on chronic diastolic CHF (congestive heart failure) (  HCC) 07/13/2022   Depression, major, single episode, moderate (HCC) 06/10/2020   Compression fracture of thoracic vertebra with routine healing 05/13/2020   Closed wedge compression fracture of T11 vertebra (HCC) 05/04/2020   Compression fracture of T11 vertebra (HCC) 05/01/2020   Multiple fractures of ribs, left side, initial encounter for closed fracture 05/01/2020   Hypokalemia 03/27/2020   Chest pressure 06/23/2015   Chronic anticoagulation 04/09/2014   Essential hypertension 04/08/2013   Hyperlipidemia 04/08/2013    Atrial fibrillation (HCC) 02/13/2013   PCP:  Gweneth Dimitri, MD Pharmacy:   [facility] [address], [place] - [address] [place] [address] Phone: [phone] Fax: [phone]  [facility] [address], [place] - [address] [place] [address] Phone: [phone] Fax: [phone]  [facility] - [place], [place] - [place] [address] [place] [address] Phone: [phone] Fax: [phone]  [facility] [address] [place] [address] Phone: [phone] Fax: [phone]  [facility] - [place], [place] - [address] [place] [address] Phone: [phone] Fax: [phone]     Social Drivers of Health (SDOH) Social History: SDOH Screenings   Food Insecurity: No Food Insecurity (04/22/2023)  Housing: Low Risk  (04/22/2023)  Transportation Needs: No Transportation Needs (04/22/2023)  Utilities: Not At Risk (04/22/2023)  Tobacco Use: Low Risk  (04/23/2023)   SDOH Interventions:     Readmission Risk Interventions     No data to display

## 2023-04-23 NOTE — Transfer of Care (Signed)
Immediate Anesthesia Transfer of Care Note  Patient: Danielle Thomas  Procedure(s) Performed: INTRAMEDULLARY (IM) NAIL INTERTROCHANTERIC (Right)  Patient Location: PACU  Anesthesia Type:GA combined with regional for post-op pain  Level of Consciousness: awake and alert   Airway & Oxygen Therapy: Patient Spontanous Breathing  Post-op Assessment: Report given to RN and Post -op Vital signs reviewed and stable  Post vital signs: Reviewed and stable  Last Vitals:  Vitals Value Taken Time  BP    Temp    Pulse 59 04/23/23 1212  Resp 17 04/23/23 1212  SpO2 88 % 04/23/23 1212  Vitals shown include unfiled device data.  Last Pain:  Vitals:   04/23/23 0940  TempSrc:   PainSc: 10-Worst pain ever      Patients Stated Pain Goal: 3 (04/23/23 0940)  Complications: No notable events documented.

## 2023-04-23 NOTE — Plan of Care (Signed)
  Problem: Clinical Measurements: Goal: Ability to maintain clinical measurements within normal limits will improve Outcome: Progressing Goal: Will remain free from infection Outcome: Progressing   Problem: Nutrition: Goal: Adequate nutrition will be maintained Outcome: Progressing   Problem: Pain Management: Goal: General experience of comfort will improve Outcome: Progressing   Problem: Safety: Goal: Ability to remain free from injury will improve Outcome: Progressing   Problem: Skin Integrity: Goal: Risk for impaired skin integrity will decrease Outcome: Progressing

## 2023-04-23 NOTE — Plan of Care (Signed)

## 2023-04-23 NOTE — Progress Notes (Signed)
[facility] PROGRESS NOTE    Progress Note  Danielle Thomas  ZOX:[id number] DOB: 1928/04/29 DOA: 04/22/2023 PCP: Gweneth Dimitri, MD     Brief Narrative:   Danielle Thomas is an 89 y.o. female past medical history of essential hypertension, chronic atrial fibrillation on Eliquis, chronic systolic and diastolic heart failure, osteoporosis comes to the hospital after right hip pain following a fall, hip x-ray showed comminuted intertrochanteric right proximal fracture    Assessment/Plan:   Closed fracture of right femur, unspecified fracture morphology, initial encounter [facility]): She was given fentanyl and ketamine in the ED became bradycardic, unresponsive as well as hypoxic.  Given Narcan and now resolved. She was changed low-dose hydrocodone. Currently n.p.o. for surgical intervention.  Orthopedic surgery has been consulted. Last dose of Eliquis was on 04/22/2019 4 in the morning. Orthopedic surgery was consulted. PT/OT anticipate rehab.  Chronic atrial fibrillation (HCC) Continue to hold Eliquis, last dose 04/22/2023.  Essential hypertension: Hold Lasix and ARB use hydralazine orally as needed. Blood pressure is borderline continue IV fluids.  Acute kidney injury: With a baseline creatinine of less than 1 on admission 1.4. Hold NSAIDs and diuretic therapy. Started on IV fluids recheck basic metabolic panel in the morning.  Acute hypoxic respiratory failure (HCC) Likely due to IV narcotics. Use very low-dose of morphine.  Chronic combined systolic (congestive) and diastolic (congestive) heart failure (HCC) Will be judicious with IV fluids.  Last 2D echo in 2024 showed an EF of 45% Holding ARB and Lasix.  Traumatic Hematuria: Hold Eliquis.   DVT prophylaxis: none, resume Eliquis post surgical intervention. Family Communication:none Status is: Inpatient Remains inpatient appropriate because: Acute comminuted right femur fracture    Code Status:     Code  Status Orders  (From admission, onward)           Start     Ordered   04/22/23 2006  Do not attempt resuscitation (DNR)- Limited -Do Not Intubate (DNI)  Continuous       Question Answer Comment  If pulseless and not breathing No CPR or chest compressions.   In Pre-Arrest Conditions (Patient Is Breathing and Has A Pulse) Do not intubate. Provide all appropriate non-invasive medical interventions. Avoid ICU transfer unless indicated or required.   Consent: Discussion documented in EHR or advanced directives reviewed      04/22/23 2007           Code Status History     Date Active Date Inactive Code Status Order ID Comments User Context   07/14/2022 0206 07/21/2022 0246 DNR [id number]  Gery Pray, MD ED   07/14/2022 0019 07/14/2022 0206 Full Code [id number]  Gery Pray, MD ED   07/14/2022 0016 07/14/2022 0019 Full Code [id number]  Gery Pray, MD ED   06/04/2020 1059 07/13/2022 1839 DNR [id number]  Bufford Spikes L Outpatient   05/13/2020 1552 05/21/2020 1944 Full Code [id number]  Charlton Amor, PA-C Inpatient   05/13/2020 1552 05/13/2020 1552 Partial Code [id number]  Charlton Amor, PA-C Inpatient   05/01/2020 2044 05/13/2020 1503 Partial Code [id number]  Charlsie Quest, MD Inpatient   03/27/2020 1607 03/31/2020 0424 Full Code [id number]  Teddy Spike, DO Inpatient      Advance Directive Documentation    Flowsheet Row Most Recent Value  Type of Advance Directive Healthcare Power of Attorney  Pre-existing out of facility DNR order (yellow form or pink MOST form) --  "MOST" Form in Place? --         IV  Access:   Peripheral IV   Procedures and diagnostic studies:   CT Head Wo Contrast Result Date: 04/22/2023 CLINICAL DATA:  Altered mental status. EXAM: CT HEAD WITHOUT CONTRAST CT CERVICAL SPINE WITHOUT CONTRAST TECHNIQUE: Multidetector CT imaging of the head and cervical spine was performed following the standard protocol without intravenous contrast. Multiplanar CT image  reconstructions of the cervical spine were also generated. RADIATION DOSE REDUCTION: This exam was performed according to the departmental dose-optimization program which includes automated exposure control, adjustment of the mA and/or kV according to patient size and/or use of iterative reconstruction technique. COMPARISON:  None Available. FINDINGS: CT HEAD FINDINGS Brain: There is periventricular white matter decreased attenuation consistent with small vessel ischemic changes. Ventricles, sulci and cisterns are prominent consistent with age related involutional changes. No acute intracranial hemorrhage, mass effect or shift. No hydrocephalus. Vascular: No hyperdense vessel or unexpected calcification. Skull: Normal. Negative for fracture or focal lesion. Sinuses/Orbits: No acute finding. CT CERVICAL SPINE FINDINGS Alignment: Normal. Skull base and vertebrae: No acute fracture. No primary bone lesion or focal pathologic process. Soft tissues and spinal canal: No prevertebral fluid or swelling. No visible canal hematoma. Disc levels: Extensive degenerative disc disease identified at each cervical level. Especially large marginal osteophyte formation noted at C5-6. There is osteoarthritis at C1-C2. Upper chest: Pleural/parenchymal opacities consistent with scarring in the apices. IMPRESSION: 1. Atrophy and chronic small vessel ischemic changes. No acute intracranial process identified. 2. Degenerative changes of the cervical spine. No acute osseous abnormalities. Electronically Signed   By: Layla Maw M.D.   On: 04/22/2023 18:43   CT Cervical Spine Wo Contrast Result Date: 04/22/2023 CLINICAL DATA:  Altered mental status. EXAM: CT HEAD WITHOUT CONTRAST CT CERVICAL SPINE WITHOUT CONTRAST TECHNIQUE: Multidetector CT imaging of the head and cervical spine was performed following the standard protocol without intravenous contrast. Multiplanar CT image reconstructions of the cervical spine were also generated.  RADIATION DOSE REDUCTION: This exam was performed according to the departmental dose-optimization program which includes automated exposure control, adjustment of the mA and/or kV according to patient size and/or use of iterative reconstruction technique. COMPARISON:  None Available. FINDINGS: CT HEAD FINDINGS Brain: There is periventricular white matter decreased attenuation consistent with small vessel ischemic changes. Ventricles, sulci and cisterns are prominent consistent with age related involutional changes. No acute intracranial hemorrhage, mass effect or shift. No hydrocephalus. Vascular: No hyperdense vessel or unexpected calcification. Skull: Normal. Negative for fracture or focal lesion. Sinuses/Orbits: No acute finding. CT CERVICAL SPINE FINDINGS Alignment: Normal. Skull base and vertebrae: No acute fracture. No primary bone lesion or focal pathologic process. Soft tissues and spinal canal: No prevertebral fluid or swelling. No visible canal hematoma. Disc levels: Extensive degenerative disc disease identified at each cervical level. Especially large marginal osteophyte formation noted at C5-6. There is osteoarthritis at C1-C2. Upper chest: Pleural/parenchymal opacities consistent with scarring in the apices. IMPRESSION: 1. Atrophy and chronic small vessel ischemic changes. No acute intracranial process identified. 2. Degenerative changes of the cervical spine. No acute osseous abnormalities. Electronically Signed   By: Layla Maw M.D.   On: 04/22/2023 18:43   DG Chest Port 1 View Result Date: 04/22/2023 CLINICAL DATA:  Shortness of breath EXAM: PORTABLE CHEST 1 VIEW COMPARISON:  07/15/2022 FINDINGS: Cardiomegaly. Differential densities along the left cardiac silhouette are new since 07/15/2022. Aortic atherosclerotic calcification. Diffuse bilateral interstitial opacities. Pulmonary vascular congestion. No pleural effusion or pneumothorax. Sutures project over the right lower lung.  Thoracolumbar fusion hardware. IMPRESSION: 1. Cardiomegaly  with pulmonary edema. 2. Differential densities along the left cardiac silhouette are new since 07/15/2022. This may be related to patient rotation however pericardial effusion is difficult to exclude. Consider CT for further evaluation. Electronically Signed   By: Minerva Fester M.D.   On: 04/22/2023 17:05   DG Pelvis 1-2 Views Result Date: 04/22/2023 CLINICAL DATA:  [id number] Fall 190176 EXAM: PELVIS - 1-2 VIEW COMPARISON:  None Available. FINDINGS: Comminuted intertrochanteric proximal right femur fracture with mild impaction, apex lateral angulation and mild retraction of the lesser trochanter fracture fragment. No evidence of hip dislocation on these frontal views. No focal osseous lesions. IMPRESSION: Comminuted intertrochanteric proximal right femur fracture. Electronically Signed   By: Delbert Phenix M.D.   On: 04/22/2023 16:29   DG Femur Min 2 Views Right Result Date: 04/22/2023 CLINICAL DATA:  Fall EXAM: RIGHT FEMUR 2 VIEWS COMPARISON:  None Available. FINDINGS: Comminuted intertrochanteric proximal right femur fracture with apex lateral angulation and mild impaction. No focal osseous lesions. No evidence of dislocation at the right hip or right knee on these limited views. IMPRESSION: Comminuted intertrochanteric proximal right femur fracture. Electronically Signed   By: Delbert Phenix M.D.   On: 04/22/2023 16:28     Medical Consultants:   None.   Subjective:    Danielle Thomas is her pain is controlled.  Objective:    Vitals:   04/22/23 2210 04/23/23 0149 04/23/23 0522 04/23/23 0722  BP: (!) 116/51 (!) 100/51 (!) 96/51 (!) 100/53  Pulse: 67 (!) 52    Resp: 18     Temp: (!) 97.5 F (36.4 C) 97.8 F (36.6 C) 98 F (36.7 C) 98.3 F (36.8 C)  TempSrc: Axillary Axillary Axillary Oral  SpO2: 94%     Weight:      Height:       SpO2: 94 % O2 Flow Rate (L/min): 2 L/min   Intake/Output Summary (Last 24 hours) at  04/23/2023 0804 Last data filed at 04/23/2023 9562 Gross per 24 hour  Intake 100 ml  Output 300 ml  Net -200 ml   Filed Weights   04/22/23 1545  Weight: 55.8 kg    Exam: General exam: In no acute distress. Respiratory system: Good air movement and clear to auscultation. Cardiovascular system: S1 & S2 heard, RRR. No JVD. Gastrointestinal system: Abdomen is nondistended, soft and nontender.  Extremities: No pedal edema. Skin: No rashes, lesions or ulcers Psychiatry: Judgement and insight appear normal. Mood & affect appropriate.    Data Reviewed:    Labs: Basic Metabolic Panel: Recent Labs  Lab 04/22/23 1555 04/22/23 1639  NA 136 138  K 4.0 4.5  CL 107  --   CO2 25  --   GLUCOSE 150*  --   BUN 35*  --   CREATININE 1.46*  --   CALCIUM 7.9*  --    GFR Estimated Creatinine Clearance: 18.2 mL/min (A) (by C-G formula based on SCr of 1.46 mg/dL (H)). Liver Function Tests: No results for input(s): "AST", "ALT", "ALKPHOS", "BILITOT", "PROT", "ALBUMIN" in the last 168 hours. No results for input(s): "LIPASE", "AMYLASE" in the last 168 hours. No results for input(s): "AMMONIA" in the last 168 hours. Coagulation profile No results for input(s): "INR", "PROTIME" in the last 168 hours. COVID-19 Labs  No results for input(s): "DDIMER", "FERRITIN", "LDH", "CRP" in the last 72 hours.  Lab Results  Component Value Date   SARSCOV2NAA NEGATIVE 07/13/2022   SARSCOV2NAA NEGATIVE 05/01/2020   SARSCOV2NAA NEGATIVE 03/30/2020   SARSCOV2NAA  NEGATIVE 03/27/2020    CBC: Recent Labs  Lab 04/22/23 1555 04/22/23 1639  WBC 10.4  --   NEUTROABS 8.5*  --   HGB 12.6 13.6  HCT 42.3 40.0  MCV 90.4  --   PLT 238  --    Cardiac Enzymes: No results for input(s): "CKTOTAL", "CKMB", "CKMBINDEX", "TROPONINI" in the last 168 hours. BNP (last 3 results) No results for input(s): "PROBNP" in the last 8760 hours. CBG: No results for input(s): "GLUCAP" in the last 168 hours. D-Dimer: No  results for input(s): "DDIMER" in the last 72 hours. Hgb A1c: No results for input(s): "HGBA1C" in the last 72 hours. Lipid Profile: No results for input(s): "CHOL", "HDL", "LDLCALC", "TRIG", "CHOLHDL", "LDLDIRECT" in the last 72 hours. Thyroid function studies: No results for input(s): "TSH", "T4TOTAL", "T3FREE", "THYROIDAB" in the last 72 hours.  Invalid input(s): "FREET3" Anemia work up: No results for input(s): "VITAMINB12", "FOLATE", "FERRITIN", "TIBC", "IRON", "RETICCTPCT" in the last 72 hours. Sepsis Labs: Recent Labs  Lab 04/22/23 1555  WBC 10.4   Microbiology Recent Results (from the past 240 hours)  Surgical PCR screen     Status: None   Collection Time: 04/22/23 11:04 PM   Specimen: Nasal Mucosa; Nasal Swab  Result Value Ref Range Status   MRSA, PCR NEGATIVE NEGATIVE Final   Staphylococcus aureus NEGATIVE NEGATIVE Final    Comment: (NOTE) The Xpert SA Assay (FDA approved for NASAL specimens in patients 94 years of age and older), is one component of a comprehensive surveillance program. It is not intended to diagnose infection nor to guide or monitor treatment. Performed at [facility], [address] [place], [place] [address]      Medications:    furosemide  20 mg Oral Daily   lidocaine  1 patch Transdermal Q24H   losartan  50 mg Oral Daily   potassium chloride SA  20 mEq Oral Daily   rOPINIRole  0.25 mg Oral BID   senna  1 tablet Oral BID   Continuous Infusions:    LOS: 1 day   Marinda Elk  [facility]  04/23/2023, 8:04 AM

## 2023-04-23 NOTE — Anesthesia Postprocedure Evaluation (Signed)
Anesthesia Post Note  Patient: Danielle Thomas  Procedure(s) Performed: INTRAMEDULLARY (IM) NAIL INTERTROCHANTERIC (Right)     Patient location during evaluation: PACU Anesthesia Type: General Level of consciousness: awake and alert Pain management: pain level controlled Vital Signs Assessment: post-procedure vital signs reviewed and stable Respiratory status: spontaneous breathing, nonlabored ventilation, respiratory function stable and patient connected to nasal cannula oxygen Cardiovascular status: blood pressure returned to baseline and stable Postop Assessment: no apparent nausea or vomiting Anesthetic complications: no  No notable events documented.  Last Vitals:  Vitals:   04/23/23 1245 04/23/23 1342  BP: 113/62 126/63  Pulse: 67 72  Resp: 17 16  Temp: 36.7 C 36.8 C  SpO2: 94% 100%    Last Pain:  Vitals:   04/23/23 1342  TempSrc: Oral  PainSc:                  Dyesha Henault,W. EDMOND

## 2023-04-23 NOTE — Anesthesia Procedure Notes (Signed)
Anesthesia Regional Block: Femoral nerve block   Pre-Anesthetic Checklist: , timeout performed,  Correct Patient, Correct Site, Correct Laterality,  Correct Procedure, Correct Position, site marked,  Risks and benefits discussed,  Pre-op evaluation,  At surgeon's request and post-op pain management  Laterality: Right  Prep: Maximum Sterile Barrier Precautions used, chloraprep       Needles:  Injection technique: Single-shot  Needle Type: Echogenic Stimulator Needle     Needle Length: 5cm  Needle Gauge: 22     Additional Needles:   Procedures:,,,, ultrasound used (permanent image in chart),,    Narrative:  Start time: 04/23/2023 9:54 AM End time: 04/23/2023 10:04 AM Injection made incrementally with aspirations every 5 mL.  Performed by: Personally  Anesthesiologist: Gaynelle Adu, MD

## 2023-04-23 NOTE — Op Note (Signed)
04/23/2023  PATIENT:  Danielle Thomas  August 24, 1927 female   MEDICAL RECORD NUMBER: [id number]  PRE-OPERATIVE DIAGNOSIS:  RIGHT INTERTROCHANTERIC HIP FRACTURE  POST-OPERATIVE DIAGNOSIS: RIGHT INTERTROCHANTERIC HIP FRACTURE  PROCEDURE:  INTRAMEDULLARY NAILING OF THE RIGHT HIP using a statically locked Biomet Affixus nail 9 X 360 MM.  SURGEON:  Doralee Albino. Carola Frost, M.D.  ASSISTANTBrent General.  ANESTHESIA:  General.  COMPLICATIONS:  None.  ESTIMATED BLOOD LOSS:  Less than 150 mL.  DISPOSITION:  To PACU.  CONDITION:  Stable.  DELAY START OF DVT PROPHYLAXIS BECAUSE OF BLEEDING RISK: NO  BRIEF SUMMARY AND INDICATION OF PROCEDURE:  Danielle Thomas is a 89 y.o. year- old with multiple medical problems.  I discussed with the patient and family risks and benefits of surgical treatment including the potential for malunion, nonunion, symptomatic hardware, heart attack, stroke, neurovascular injury, bleeding, and others.  After acknowledging these risks, consent was provided consent to proceed.  BRIEF SUMMARY OF PROCEDURE:  The patient was taken to the operating room where general anesthesia was induced.  She was positioned supine on the Hana fracture table.  A closed reduction maneuver was performed of the fractured proximal femur and this was confirmed on both AP and lateral xray views. A thorough scrub and wash with chlorhexidine and then Betadine scrub and paint was performed.  After sterile drapes and time-out, a long instrument was used to identify the appropriate starting position under C-arm on both AP and lateral images.  A 3 cm incision was made proximal to the greater trochanter.  The curved cannulated awl was inserted just medial to the tip of the lateral trochanter and then the starting guidewire advanced into the proximal femur.  This was checked on AP and lateral views.  The starting reamer was engaged with the soft tissue protected by a sleeve.  The curved ball-tipped guidewire was then  inserted, making sure it was just posterior as possible in the distal femur and across the fracture site, which stayed in a reduced position.  It was sequentially reamed up to 11 and a 9 x 360 mm nail inserted to the appropriate depth.  The guidewire for the lag screw was then inserted with the appropriate anteversion to make sure it was in a center-center position.  This was measured and the lag screw placed with excellent purchase and position checked on both views.  The set screw was then engaged within the groove of the lag screw, which was allowed to telescope.  Traction was released and compression achieved with the compression device over the lag screw.  This was followed by placement of two distal locking screws using perfect circle technique.  This was confirmed on AP and lateral images. Wounds were irrigated thoroughly, closed in a standard layered fashion. Sterile gently compressive dressings were applied.  The patient was awakened from anesthesia and transported to the PACU in stable condition.  PROGNOSIS:  The patient will be weightbearing as tolerated with physical therapy resuming Eliquis for DVT prophylaxis.  She has no range of motion precautions.  We will continue to follow through at the hospital.  Anticipate follow up in the office in 2 weeks for removal of sutures and further evaluation.     Doralee Albino. Carola Frost, M.D.

## 2023-04-24 ENCOUNTER — Encounter (HOSPITAL_COMMUNITY): Payer: Self-pay | Admitting: Orthopedic Surgery

## 2023-04-24 DIAGNOSIS — I4811 Longstanding persistent atrial fibrillation: Secondary | ICD-10-CM | POA: Diagnosis not present

## 2023-04-24 DIAGNOSIS — I10 Essential (primary) hypertension: Secondary | ICD-10-CM | POA: Diagnosis not present

## 2023-04-24 DIAGNOSIS — S7291XA Unspecified fracture of right femur, initial encounter for closed fracture: Secondary | ICD-10-CM | POA: Diagnosis not present

## 2023-04-24 DIAGNOSIS — N179 Acute kidney failure, unspecified: Secondary | ICD-10-CM | POA: Diagnosis not present

## 2023-04-24 LAB — BASIC METABOLIC PANEL
Anion gap: 7 (ref 5–15)
BUN: 45 mg/dL — ABNORMAL HIGH (ref 8–23)
CO2: 23 mmol/L (ref 22–32)
Calcium: 8 mg/dL — ABNORMAL LOW (ref 8.9–10.3)
Chloride: 107 mmol/L (ref 98–111)
Creatinine, Ser: 1.45 mg/dL — ABNORMAL HIGH (ref 0.44–1.00)
GFR, Estimated: 34 mL/min — ABNORMAL LOW (ref >60)
Glucose, Bld: 126 mg/dL — ABNORMAL HIGH (ref 70–99)
Potassium: 5 mmol/L (ref 3.5–5.1)
Sodium: 137 mmol/L (ref 135–145)

## 2023-04-24 LAB — FERRITIN: Ferritin: 167 ng/mL (ref 11–307)

## 2023-04-24 LAB — IRON AND TIBC
Iron: 24 ug/dL — ABNORMAL LOW (ref 28–170)
Saturation Ratios: 10 % — ABNORMAL LOW (ref 10.4–31.8)
TIBC: 235 ug/dL — ABNORMAL LOW (ref 250–450)
UIBC: 211 ug/dL

## 2023-04-24 LAB — CBC
HCT: 34.7 % — ABNORMAL LOW (ref 36.0–46.0)
Hemoglobin: 10.7 g/dL — ABNORMAL LOW (ref 12.0–15.0)
MCH: 27.5 pg (ref 26.0–34.0)
MCHC: 30.8 g/dL (ref 30.0–36.0)
MCV: 89.2 fL (ref 80.0–100.0)
Platelets: 171 10*3/uL (ref 150–400)
RBC: 3.89 MIL/uL (ref 3.87–5.11)
RDW: 17.7 % — ABNORMAL HIGH (ref 11.5–15.5)
WBC: 13.7 10*3/uL — ABNORMAL HIGH (ref 4.0–10.5)
nRBC: 0 % (ref 0.0–0.2)

## 2023-04-24 LAB — RETICULOCYTES
Immature Retic Fract: 12.6 % (ref 2.3–15.9)
RBC.: 3.54 MIL/uL — ABNORMAL LOW (ref 3.87–5.11)
Retic Count, Absolute: 72.2 10*3/uL (ref 19.0–186.0)
Retic Ct Pct: 2 % (ref 0.4–3.1)

## 2023-04-24 LAB — VITAMIN B12: Vitamin B-12: 349 pg/mL (ref 180–914)

## 2023-04-24 LAB — FOLATE: Folate: 5.9 ng/mL — ABNORMAL LOW (ref >5.9)

## 2023-04-24 LAB — VITAMIN D 25 HYDROXY (VIT D DEFICIENCY, FRACTURES): Vit D, 25-Hydroxy: 22.08 ng/mL — ABNORMAL LOW (ref 30–100)

## 2023-04-24 MED ORDER — SODIUM CHLORIDE 0.9 % IV SOLN: INTRAVENOUS | Status: AC

## 2023-04-24 MED ORDER — APIXABAN 2.5 MG PO TABS
2.5000 mg | ORAL_TABLET | Freq: Two times a day (BID) | ORAL | Status: DC
Start: 2023-04-24 — End: 2023-04-26
  Administered 2023-04-24 – 2023-04-26 (×5): 2.5 mg via ORAL
  Filled 2023-04-24 (×5): qty 1

## 2023-04-24 NOTE — Progress Notes (Signed)
PHARMACY - ANTICOAGULATION CONSULT NOTE  Pharmacy Consult for restarting PTA apixaban post-op if Hgb >/=8.5 on POD1 Indication: atrial fibrillation  Allergies  Allergen Reactions   Amlodipine Besylate     Other reaction(s): high dose make feet swell   Codeine Nausea And Vomiting   Meperidine Nausea Only   Metoprolol Succinate [Metoprolol]     Other reaction(s): effects her breathing   Oxycodone Nausea And Vomiting    Patient Measurements: Height: 5\' 1"  (154.9 cm) Weight: 55.7 kg (122 lb 12.7 oz) IBW/kg (Calculated) : 47.8  Vital Signs: Temp: 98 F (36.7 C) (12/17 0718) Temp Source: Oral (12/17 0718) BP: 122/62 (12/17 0718) Pulse Rate: 60 (12/17 0416)  Labs: Recent Labs    04/22/23 1555 04/22/23 1639 04/23/23 0841 04/24/23 0600  HGB 12.6 13.6  --  10.7*  HCT 42.3 40.0  --  34.7*  PLT 238  --   --  171  CREATININE 1.46*  --  1.59* 1.45*    Estimated Creatinine Clearance: 18.3 mL/min (A) (by C-G formula based on SCr of 1.45 mg/dL (H)).   Medical History: Past Medical History:  Diagnosis Date   Acute CHF (congestive heart failure) (HCC) 07/13/2022   Does use hearing aid    Hypertension     Medications:  Medications Prior to Admission  Medication Sig Dispense Refill Last Dose/Taking   acetaminophen (TYLENOL) 500 MG tablet Take 650 mg by mouth every 6 (six) hours as needed for mild pain (pain score 1-3).   Taking As Needed   apixaban (ELIQUIS) 2.5 MG TABS tablet Take 1 tablet (2.5 mg total) by mouth 2 (two) times daily. 60 tablet 2 Taking   dorzolamide (TRUSOPT) 2 % ophthalmic solution Place 1 drop into both eyes 2 (two) times daily.    Taking   empagliflozin (JARDIANCE) 10 MG TABS tablet Take 1 tablet (10 mg total) by mouth daily. 30 tablet 0 Taking   furosemide (LASIX) 20 MG tablet Take 1 tablet (20 mg total) by mouth daily. Take tablet (20 mg) by mouth daily. May take extra tablet by mouth if weight gain of 3 lbs overnight, 5 lbs in a week, and/or swelling of  lower extremities 90 tablet 1 Taking   latanoprost (XALATAN) 0.005 % ophthalmic solution Place 1 drop into both eyes at bedtime. 2.5 mL 0 Taking   losartan (COZAAR) 50 MG tablet TAKE 1 TABLET EVERY DAY (NEW DOSE) 90 tablet 2 Taking   potassium chloride SA (KLOR-CON M) 20 MEQ tablet TAKE 1 TABLET EVERY DAY 90 tablet 1 Taking   rOPINIRole (REQUIP) 0.25 MG tablet Take 1 tablet (0.25 mg total) by mouth daily as needed (restless legs around 10am - home dose). (Patient taking differently: Take 0.25 mg by mouth 4 (four) times daily as needed (restless leg). Taken 4pm and 8pm and an additional dose in the middle of the night and possibly in the morning.) 30 tablet 0 Taking Differently    Assessment: Danielle Thomas is an 89 y.o. female with past medical history of atrial fibrillation on apixaban. Patient admitted after a fall for intramedullary nailing of the right hip. Today is POD1 and hemoglobin is 10.7. Pharmacy consulted to resume home apixaban on POD1 if Hgb >/= 8.5. Age: 89, weight: 55.7 kg, SCr: 1.45    Goal of Therapy:  Therapeutic anticoagulation Monitor platelets by anticoagulation protocol: Yes   Plan:  Resume apixaban 2.5 mg PO BID Monitor for signs and symptoms of bleeding Pharmacy will sign off and continue to monitor peripherally.  Thank you for allowing pharmacy to be a part of this patient's care.   Signe Colt, PharmD 04/24/2023 8:05 AM  **Pharmacist phone directory can be found on [email or web address] listed under [facility]**

## 2023-04-24 NOTE — Evaluation (Addendum)
Occupational Therapy Evaluation Patient Details Name: Danielle Thomas MRN: [id number] DOB: 02-18-1928 Today's Date: 04/24/2023   History of Present Illness Pt is a 89 y/o female presenting on 12/15 with R hip pain after fall going up stairs. Pt found with R femur comminuted intertrochanteric fx, chest xray with cardiomegaly with pulmonary edema and questionable pericardial effusion. 12/16 S/P IM nail R hip, WBAT.  PMH includes CHF, HTN, afib, RLS, osteoporosis, compression fx.   Clinical Impression   PTA patient independent with ADLs, light IADLs and using walker vs rollator for mobility. Admitted for above and presents with problem list below.  She requires mod assist +2 for bed mobility, max assist +2 for transfers to recliner, and setup to total assist for ADLs.  Daughter reports family is planning to provide increased support at dc, looking to setup a plan for 24/7.  Based on performance today, believe pt will best benefit from continued OT services acutely and after dc at an inpatient setting with >3hrs/day to optimize independence, safety and return to PLOF with ADLs and mobility.        If plan is discharge home, recommend the following: Two people to help with walking and/or transfers;Two people to help with bathing/dressing/bathroom;Assistance with cooking/housework;Assist for transportation;Help with stairs or ramp for entrance    Functional Status Assessment  Patient has had a recent decline in their functional status and demonstrates the ability to make significant improvements in function in a reasonable and predictable amount of time.  Equipment Recommendations  Other (comment) (defer)    Recommendations for Other Services Rehab consult     Precautions / Restrictions Precautions Precautions: Fall Restrictions Weight Bearing Restrictions Per Provider Order: Yes RLE Weight Bearing Per Provider Order: Weight bearing as tolerated      Mobility Bed Mobility Overal bed  mobility: Needs Assistance Bed Mobility: Supine to Sit     Supine to sit: Mod assist, +2 for physical assistance     General bed mobility comments: assist for L LE and hips to EOB, min assist to guide trunk to upright    Transfers Overall transfer level: Needs assistance Equipment used: Rolling walker (2 wheels), 2 person hand held assist Transfers: Sit to/from Stand, Bed to chair/wheelchair/BSC Sit to Stand: Max assist, +2 physical assistance Stand pivot transfers: Max assist, +2 physical assistance         General transfer comment: standing with RW and bil hand held support, max assist +2; pt fearful of falling and improved stability with bil hand held support.  pivoting to recliner with max assist +2 as patient becomes fearful and has difficulty following commands to sequences pivot to reclienr      Balance Overall balance assessment: Needs assistance Sitting-balance support: No upper extremity supported, Feet supported Sitting balance-Leahy Scale: Fair Sitting balance - Comments: min guard, limited dynamically   Standing balance support: Bilateral upper extremity supported, During functional activity Standing balance-Leahy Scale: Poor Standing balance comment: relies on BUE and external suppport                           ADL either performed or assessed with clinical judgement   ADL Overall ADL's : Needs assistance/impaired     Grooming: Set up;Sitting           Upper Body Dressing : Sitting;Minimal assistance   Lower Body Dressing: +2 for physical assistance;Total assistance;Sit to/from stand   Toilet Transfer: Maximal assistance;+2 for physical assistance Toilet Transfer Details (indicate  cue type and reason): stand pivot bil hand held support         Functional mobility during ADLs: Maximal assistance;+2 for physical assistance       Vision   Vision Assessment?: No apparent visual deficits Additional Comments: not formally assessed      Perception         Praxis         Pertinent Vitals/Pain Pain Assessment Pain Assessment: Faces Faces Pain Scale: Hurts little more Pain Location: R LE Pain Descriptors / Indicators: Discomfort, Grimacing, Operative site guarding Pain Intervention(s): Limited activity within patient's tolerance, Monitored during session, Repositioned     Extremity/Trunk Assessment Upper Extremity Assessment Upper Extremity Assessment: Generalized weakness   Lower Extremity Assessment Lower Extremity Assessment: Defer to PT evaluation   Cervical / Trunk Assessment Cervical / Trunk Assessment: Kyphotic   Communication Communication Communication: Hearing impairment Cueing Techniques: Verbal cues;Tactile cues;Visual cues   Cognition Arousal: Alert Behavior During Therapy: Anxious Overall Cognitive Status: Impaired/Different from baseline Area of Impairment: Problem solving                             Problem Solving: Slow processing, Requires verbal cues General Comments: requires cueing for sequencing and problem solving, requires repeated cues but is [facility]     General Comments  daughter at side and supportive    Exercises     Shoulder Instructions      Home Living Family/patient expects to be discharged to:: Private residence Living Arrangements: Alone Available Help at Discharge: Family;Available 24 hours/day Type of Home: House Home Access: Stairs to enter [employer] of Steps: 2 Entrance Stairs-Rails: None Home Layout: Two level;Able to live on main level with bedroom/bathroom;1/2 bath on main level Alternate Level Stairs-Number of Steps: 13 Alternate Level Stairs-Rails: Right Bathroom Shower/Tub: Chief Strategy Officer: Standard Bathroom Accessibility: Yes   Home Equipment: Rollator (4 wheels);Cane - single Librarian, academic (2 wheels);Shower seat;Hand held shower head;BSC/3in1;Toilet riser          Prior  Functioning/Environment Prior Level of Function : Independent/Modified Independent             Mobility Comments: ambulates with use of rollator and RW, uses cane when ambulating to car ADLs Comments: reports managing ADLs without assist, light IADLs, driving        OT Problem List: Decreased strength;Decreased activity tolerance;Impaired balance (sitting and/or standing);Pain;Decreased knowledge of precautions;Decreased knowledge of use of DME or AE;Decreased safety awareness;Decreased cognition      OT Treatment/Interventions: Self-care/ADL training;DME and/or AE instruction;Therapeutic activities;Cognitive remediation/compensation;Balance training;Patient/family education;Therapeutic exercise    OT Goals(Current goals can be found in the care plan section) Acute Rehab OT Goals Patient Stated Goal: rehab OT Goal Formulation: With patient/family Time For Goal Achievement: 05/08/23 Potential to Achieve Goals: Good  OT Frequency: Min 1X/week    Co-evaluation PT/OT/SLP Co-Evaluation/Treatment: Yes Reason for Co-Treatment: For patient/therapist safety;To address functional/ADL transfers   OT goals addressed during session: ADL's and self-care      AM-PAC OT "6 Clicks" Daily Activity     Outcome Measure Help from another person eating meals?: A Little Help from another person taking care of personal grooming?: A Little Help from another person toileting, which includes using toliet, bedpan, or urinal?: A Lot Help from another person bathing (including washing, rinsing, drying)?: A Lot Help from another person to put on and taking off regular upper body clothing?: A Little Help from another person to  put on and taking off regular lower body clothing?: Total 6 Click Score: 14   End of Session Equipment Utilized During Treatment: Gait belt;Rolling walker (2 wheels) Nurse Communication: Mobility status;Need for lift equipment;Weight bearing status  Activity Tolerance: Patient  tolerated treatment well Patient left: in chair;with call bell/phone within reach;with chair alarm set;with family/visitor present  OT Visit Diagnosis: Other abnormalities of gait and mobility (R26.89);Muscle weakness (generalized) (M62.81);Pain Pain - Right/Left: Right Pain - part of body: Leg                Time: 6301-6010 OT Time Calculation (min): 33 min Charges:  OT General Charges $OT Visit: 1 Visit OT Evaluation $OT Eval Moderate Complexity: 1 Mod  Barry Brunner, OT Acute Rehabilitation Services Office [phone]   Chancy Milroy 04/24/2023, 1:19 PM

## 2023-04-24 NOTE — Progress Notes (Signed)
Orthopaedic Trauma Service Progress Note  Patient ID: Danielle Thomas MRN: [id number] DOB/AGE: 1927/11/03 89 y.o.  Subjective:  Doing well Mild R hip pain but tolerable Uses walker at baseline    ROS As above  Objective:   VITALS:   Vitals:   04/23/23 2225 04/24/23 0148 04/24/23 0416 04/24/23 0718  BP: 119/62 (!) 95/47 (!) 92/52 122/62  Pulse: 74 (!) 55 60   Resp: (!) 22  15   Temp: 98 F (36.7 C) 98.2 F (36.8 C) 97.9 F (36.6 C) 98 F (36.7 C)  TempSrc: Oral Oral Oral Oral  SpO2: 93%  93%   Weight:      Height:        Estimated body mass index is 23.2 kg/m as calculated from the following:   Height as of this encounter: 5\' 1"  (1.549 m).   Weight as of this encounter: 55.7 kg.   Intake/Output      12/16 0701 12/17 0700 12/17 0701 12/18 0700   P.O. 240 360   I.V. (mL/kg) 600 (10.8)    IV Piggyback 200    Total Intake(mL/kg) 1040 (18.7) 360 (6.5)   Urine (mL/kg/hr) 0 (0)    Blood 100    Total Output 100    Net +940 +360        Urine Occurrence 1 x      LABS  Results for orders placed or performed during the hospital encounter of 04/22/23 (from the past 24 hours)  CBC     Status: Abnormal   Collection Time: 04/24/23  6:00 AM  Result Value Ref Range   WBC 13.7 (H) 4.0 - 10.5 K/uL   RBC 3.89 3.87 - 5.11 MIL/uL   Hemoglobin 10.7 (L) 12.0 - 15.0 g/dL   HCT 40.9 (L) 81.1 - 91.4 %   MCV 89.2 80.0 - 100.0 fL   MCH 27.5 26.0 - 34.0 pg   MCHC 30.8 30.0 - 36.0 g/dL   RDW 78.2 (H) 95.6 - 21.3 %   Platelets 171 150 - 400 K/uL   nRBC 0.0 0.0 - 0.2 %  Basic metabolic panel     Status: Abnormal   Collection Time: 04/24/23  6:00 AM  Result Value Ref Range   Sodium 137 135 - 145 mmol/L   Potassium 5.0 3.5 - 5.1 mmol/L   Chloride 107 98 - 111 mmol/L   CO2 23 22 - 32 mmol/L   Glucose, Bld 126 (H) 70 - 99 mg/dL   BUN 45 (H) 8 - 23 mg/dL   Creatinine, Ser 0.86 (H) 0.44 - 1.00 mg/dL    Calcium 8.0 (L) 8.9 - 10.3 mg/dL   GFR, Estimated 34 (L) >60 mL/min   Anion gap 7 5 - 15  VITAMIN D 25 Hydroxy (Vit-D Deficiency, Fractures)     Status: Abnormal   Collection Time: 04/24/23  6:00 AM  Result Value Ref Range   Vit D, 25-Hydroxy 22.08 (L) 30 - 100 ng/mL  Reticulocytes     Status: Abnormal   Collection Time: 04/24/23 10:18 AM  Result Value Ref Range   Retic Ct Pct 2.0 0.4 - 3.1 %   RBC. 3.54 (L) 3.87 - 5.11 MIL/uL   Retic Count, Absolute 72.2 19.0 - 186.0 K/uL   Immature Retic Fract 12.6 2.3 - 15.9 %  PHYSICAL EXAM:   Gen: sitting up in bed, NAD, appears well, pleasant Lungs: unlabored Ext:         Right Lower Extremity Dressing is clean, dry and intact  Extremity is warm  No DCT  Compartments are soft  No pain out of proportion with passive stretching of his toes or ankle  DPN, SPN, TN sensory functions are intact  EHL, FHL, lesser toe motor functions intact  Ankle flexion, extension, inversion eversion intact  + DP pulse    Assessment/Plan: 1 Day Post-Op     Anti-infectives (From admission, onward)    Start     Dose/Rate Route Frequency Ordered Stop   04/23/23 0945  ceFAZolin (ANCEF) IVPB 2g/100 mL premix        2 g 200 mL/hr over 30 Minutes Intravenous On call to O.R. 04/23/23 0930 04/23/23 1103   04/23/23 0931  ceFAZolin (ANCEF) 2-4 GM/100ML-% IVPB       Note to Pharmacy: Patsi Sears E: cabinet override      04/23/23 0931 04/23/23 1053     .  POD/HD#: 1  89 y/o female s/p fall with R hip fracture   -R hip fracture s/p IMN   WBAT R leg  ROM ast tolerated R leg   PT and OT evals  Ice PRN   Dressing changes as needed starting tomorrow     - Pain management:  Multimodal   Minimize narcotics  - ABL anemia/Hemodynamics  Monitor  - Medical issues   Per primary   - DVT/PE prophylaxis:  Eliquis restarted   - ID:   Periop abx  - Metabolic Bone Disease:  Fragility fracture  Osteoporosis  Vitamin d insufficiency   -  Activity:  As above   - Dispo:  Therapy evals  Family not wanting snf  Would like CIR then home.  Discussed this is unlikely to happent   Ortho issues stable   Mearl Latin, PA-C [phone] (C) 04/24/2023, 11:54 AM  [facility] [address] [place] [address] [phone] [phone] (F)    After 5pm and on the weekends please log on to Amion, go to orthopaedics and the look under the [facility] Call for the provider(s) on call. You can also call our office at [phone] and then follow the prompts to be connected to the call team.  Patient ID: Danielle Thomas, female   DOB: 1927/07/18, 89 y.o.   MRN: [id number]

## 2023-04-24 NOTE — TOC Progression Note (Signed)
Transition of Care [facility]) - Progression Note    Patient Details  Name: Danielle Thomas MRN: [id number] Date of Birth: July 04, 1927  Transition of Care [facility]) CM/SW Contact  Lorri Frederick, LCSW Phone Number: 04/24/2023, 2:40 PM  Clinical Narrative:   CIR unable to accept pt.  CSW spoke with pt in room regarding SNF and then also spoke with daughter Samara Deist.  They are agreeable to SNF, requesting [facility], [facility], or [facility], in that order.  Referral sent out to those facilities and CSW reached out to them to review.     Expected Discharge Plan: Skilled Nursing Facility Barriers to Discharge: SNF Pending bed offer  Expected Discharge Plan and Services In-house Referral: Clinical Social Work   Post Acute Care Choice: Skilled Nursing Facility Living arrangements for the past 2 months: Single Family Home                                       Social Determinants of Health (SDOH) Interventions SDOH Screenings   Food Insecurity: No Food Insecurity (04/22/2023)  Housing: Low Risk  (04/22/2023)  Transportation Needs: No Transportation Needs (04/22/2023)  Utilities: Not At Risk (04/22/2023)  Tobacco Use: Low Risk  (04/23/2023)    Readmission Risk Interventions     No data to display

## 2023-04-24 NOTE — NC FL2 (Signed)
[place] MEDICAID FL2 LEVEL OF CARE FORM     IDENTIFICATION  Patient Name: Danielle Thomas Birthdate: 02-24-1928 Sex: female Admission Date (Current Location): 04/22/2023  [facility] and [place] Number:  Producer, television/film/video and Address:  [facility] [place], [address] [place], [place] [address]      Provider Number: [id number]  Attending Physician Name and Address:  David Stall, Darin Engels, MD  Relative Name and Phone Number:  Vista Deck Daughter   [phone]    Current Level of Care: Hospital Recommended Level of Care: Skilled Nursing Facility Prior Approval Number:    Date Approved/Denied:   PASRR Number: [id number]  Discharge Plan: SNF    Current Diagnoses: Patient Active Problem List   Diagnosis Date Noted   Closed right hip fracture (HCC) 04/22/2023   Acute hypoxic respiratory failure (HCC) 04/22/2023   Closed fracture of right femur, unspecified fracture morphology, initial encounter (HCC) 04/22/2023   Chronic combined systolic (congestive) and diastolic (congestive) heart failure (HCC) 04/22/2023   AKI (acute kidney injury) (HCC) 04/22/2023   Heart failure (HCC) 07/14/2022   Acute on chronic diastolic CHF (congestive heart failure) (HCC) 07/13/2022   Depression, major, single episode, moderate (HCC) 06/10/2020   Compression fracture of thoracic vertebra with routine healing 05/13/2020   Closed wedge compression fracture of T11 vertebra (HCC) 05/04/2020   Compression fracture of T11 vertebra (HCC) 05/01/2020   Multiple fractures of ribs, left side, initial encounter for closed fracture 05/01/2020   Hypokalemia 03/27/2020   Chest pressure 06/23/2015   Chronic anticoagulation 04/09/2014   Essential hypertension 04/08/2013   Hyperlipidemia 04/08/2013   Atrial fibrillation (HCC) 02/13/2013    Orientation RESPIRATION BLADDER Height & Weight     Self, Time, Situation, Place  O2 Incontinent Weight: 122 lb 12.7 oz (55.7 kg) Height:  5'  1" (154.9 cm)  BEHAVIORAL SYMPTOMS/MOOD NEUROLOGICAL BOWEL NUTRITION STATUS      Continent Diet  AMBULATORY STATUS COMMUNICATION OF NEEDS Skin   Total Care Verbally Surgical wounds, Other (Comment) (ecchymosis)                       Personal Care Assistance Level of Assistance  Bathing, Feeding, Dressing, Total care Bathing Assistance: Maximum assistance Feeding assistance: Limited assistance Dressing Assistance: Maximum assistance Total Care Assistance: Maximum assistance   Functional Limitations Info  Sight, Hearing, Speech Sight Info: Adequate Hearing Info: Impaired Speech Info: Adequate    SPECIAL CARE FACTORS FREQUENCY  PT (By licensed PT), OT (By licensed OT)     PT Frequency: 5x week OT Frequency: 5x week            Contractures Contractures Info: Not present    Additional Factors Info  Code Status, Allergies Code Status Info: DNR Allergies Info: Amlodipine Besylate, Codeine, Meperidine, Metoprolol Succinate (Metoprolol), Oxycodone           Current Medications (04/24/2023):  This is the current hospital active medication list Current Facility-Administered Medications  Medication Dose Route Frequency Provider Last Rate Last Admin   0.9 %  sodium chloride infusion   Intravenous Continuous Marinda Elk, MD 75 mL/hr at 04/24/23 1059 New Bag at 04/24/23 1059   acetaminophen (TYLENOL) tablet 650 mg  650 mg Oral Q6H PRN Tu, Ching T, DO       apixaban (ELIQUIS) tablet 2.5 mg  2.5 mg Oral BID Marinda Elk, MD   2.5 mg at 04/24/23 0842   docusate sodium (COLACE) capsule 100 mg  100  mg Oral BID Montez Morita, PA-C   100 mg at 04/24/23 0840   dorzolamide (TRUSOPT) 2 % ophthalmic solution 1 drop  1 drop Both Eyes BID Marinda Elk, MD   1 drop at 04/24/23 4098   hydrALAZINE (APRESOLINE) tablet 25 mg  25 mg Oral Q6H PRN Marinda Elk, MD       HYDROcodone-acetaminophen (NORCO/VICODIN) 5-325 MG per tablet 1-2 tablet  1-2 tablet Oral Q6H PRN  Tu, Ching T, DO   2 tablet at 04/24/23 0840   latanoprost (XALATAN) 0.005 % ophthalmic solution 1 drop  1 drop Both Eyes QHS Marinda Elk, MD   1 drop at 04/23/23 2230   lidocaine (LIDODERM) 5 % 1 patch  1 patch Transdermal Q24H Tu, Ching T, DO   1 patch at 04/23/23 2229   menthol-cetylpyridinium (CEPACOL) lozenge 3 mg  1 lozenge Oral PRN Montez Morita, PA-C       Or   phenol (CHLORASEPTIC) mouth spray 1 spray  1 spray Mouth/Throat PRN Montez Morita, PA-C       metoCLOPramide (REGLAN) tablet 5-10 mg  5-10 mg Oral Q8H PRN Montez Morita, PA-C       Or   metoCLOPramide (REGLAN) injection 5-10 mg  5-10 mg Intravenous Q8H PRN Montez Morita, PA-C       morphine (PF) 2 MG/ML injection 0.5 mg  0.5 mg Intravenous Q2H PRN Tu, Ching T, DO       naloxone [facility]) injection 0.4 mg  0.4 mg Intravenous PRN Horton, Kristie M, DO       ondansetron (ZOFRAN) tablet 4 mg  4 mg Oral Q6H PRN Montez Morita, PA-C       Or   ondansetron [facility]) injection 4 mg  4 mg Intravenous Q6H PRN Montez Morita, PA-C       polyethylene glycol (MIRALAX / GLYCOLAX) packet 17 g  17 g Oral BID Marinda Elk, MD   17 g at 04/24/23 0843   rOPINIRole (REQUIP) tablet 0.25 mg  0.25 mg Oral BID PRN Tu, Ching T, DO       senna (SENOKOT) tablet 8.6 mg  1 tablet Oral BID Tu, Ching T, DO   8.6 mg at 04/24/23 0840   sodium chloride 0.9 % bolus 500 mL  500 mL Intravenous Once Marinda Elk, MD         Discharge Medications: Please see discharge summary for a list of discharge medications.  Relevant Imaging Results:  Relevant Lab Results:   Additional Information SS#: [id number]  Lorri Frederick, LCSW

## 2023-04-24 NOTE — Progress Notes (Signed)
Transition of Care [facility]) - CAGE-AID Screening   Patient Details  Name: Danielle Thomas MRN: [id number] Date of Birth: Feb 19, 1928  [facility]) CM/SW Contact:    Leota Sauers, RN Phone Number: 04/24/2023, 8:03 PM   Clinical Narrative:  Patient denies use of alcohol and illicit substances. Resources not given at this time.  CAGE-AID Screening:    Have You Ever Felt You Ought to Cut Down on Your Drinking or Drug Use?: No Have People Annoyed You By Critizing Your Drinking Or Drug Use?: No Have You Felt Bad Or Guilty About Your Drinking Or Drug Use?: No Have You Ever Had a Drink or Used Drugs First Thing In The Morning to Steady Your Nerves or to Get Rid of a Hangover?: No CAGE-AID Score: 0  Substance Abuse Education Offered: No

## 2023-04-24 NOTE — Progress Notes (Signed)
  Inpatient Rehabilitation Admissions Coordinator   Per therapy recommendations patient was screened for CIR candidacy by Ottie Glazier RN MSN. Current payor trends with [facility] Medicare are unlikley to approve a CIR/AIR level rehab for this diagnosis. I will not place a Rehab Conuslt at this time. Previously at [facility] in 2022 after a thoracic fx. Discharged to SNF after CIR during that stay. Recommend other rehab venues to be pursued. We are not pursuing CIR at this time.  Ottie Glazier, RN, MSN Rehab Admissions Coordinator [phone] 04/24/2023 1:42 PM

## 2023-04-24 NOTE — Plan of Care (Signed)

## 2023-04-24 NOTE — Plan of Care (Signed)
  Problem: Activity: Goal: Risk for activity intolerance will decrease Outcome: Progressing   Problem: Nutrition: Goal: Adequate nutrition will be maintained Outcome: Progressing   Problem: Coping: Goal: Level of anxiety will decrease Outcome: Progressing   

## 2023-04-24 NOTE — Evaluation (Signed)
Clinical/Bedside Swallow Evaluation Patient Details  Name: Danielle Thomas MRN: [id number] Date of Birth: 1928/04/03  Today's Date: 04/24/2023 Time: SLP Start Time (ACUTE ONLY): 1250 SLP Stop Time (ACUTE ONLY): 1300 SLP Time Calculation (min) (ACUTE ONLY): 10 min  Past Medical History:  Past Medical History:  Diagnosis Date   Acute CHF (congestive heart failure) (HCC) 07/13/2022   Does use hearing aid    Hypertension    Past Surgical History:  Past Surgical History:  Procedure Laterality Date   INTRAMEDULLARY (IM) NAIL INTERTROCHANTERIC Right 04/23/2023   Procedure: INTRAMEDULLARY (IM) NAIL INTERTROCHANTERIC;  Surgeon: Myrene Galas, MD;  Location: [facility] OR;  Service: Orthopedics;  Laterality: Right;   KNEE SURGERY     LUMBAR PERCUTANEOUS PEDICLE SCREW 2 LEVEL N/A 05/04/2020   Procedure: Thoracic nine to Thoracic twelve percutaneous pedicle screws;  Surgeon: Coletta Memos, MD;  Location: [facility] OR;  Service: Neurosurgery;  Laterality: N/A;   LUNG BIOPSY     PARTIAL HYSTERECTOMY     TONSILLECTOMY AND ADENOIDECTOMY     HPI:  Danielle Thomas tripped going up her stairs and sat down hard. She had immediate right hip and knee pain and could not get up. She was brought to the ED where x-rays showed a right hip fx and orthopedic surgery was consulted. She lives at home alone and uses a RW to ambulate. Underwent surgery on 12/16.    Assessment / Plan / Recommendation  Clinical Impression  Pt enjoying food, eating a cookie when I came in. Tolerating sips and bites without signs of aspiration. Pt alert and denies pain. Mostly interested in finding ABC so she can watch [facility]. No need for SLP intervention for swallowing. Continue diet SLP will sign off. SLP Visit Diagnosis: Dysphagia, unspecified (R13.10)    Aspiration Risk  Mild aspiration risk    Diet Recommendation Regular;Thin liquid    Liquid Administration via: Cup;Straw Medication Administration: Whole meds with liquid Supervision:  Patient able to self feed Compensations: Slow rate;Small sips/bites Postural Changes: Seated upright at 90 degrees    Other  Recommendations Oral Care Recommendations: Oral care BID    Recommendations for follow up therapy are one component of a multi-disciplinary discharge planning process, led by the attending physician.  Recommendations may be updated based on patient status, additional functional criteria and insurance authorization.  Follow up Recommendations        Assistance Recommended at Discharge    Functional Status Assessment    Frequency and Duration            Prognosis        Swallow Study   General HPI: Danielle Thomas tripped going up her stairs and sat down hard. She had immediate right hip and knee pain and could not get up. She was brought to the ED where x-rays showed a right hip fx and orthopedic surgery was consulted. She lives at home alone and uses a RW to ambulate. Underwent surgery on 12/16. Type of Study: Bedside Swallow Evaluation Previous Swallow Assessment: none Diet Prior to this Study: Regular;Thin liquids (Level 0) History of Recent Intubation:  (for surgery) Behavior/Cognition: Alert;Cooperative;Pleasant mood Oral Cavity Assessment: Within Functional Limits Oral Care Completed by SLP: No Oral Cavity - Dentition: Adequate natural dentition Vision: Functional for self-feeding Self-Feeding Abilities: Able to feed self Patient Positioning: Upright in chair Baseline Vocal Quality: Normal Volitional Cough: Strong Volitional Swallow: Able to elicit    Oral/Motor/Sensory Function Overall Oral Motor/Sensory Function: Within functional limits   Ice Chips  Thin Liquid Thin Liquid: Within functional limits    Nectar Thick Nectar Thick Liquid: Not tested   Honey Thick Honey Thick Liquid: Not tested   Puree Puree: Not tested   Solid     Solid: Within functional limits      Danielle Thomas, Riley Nearing 04/24/2023,1:29 PM

## 2023-04-24 NOTE — Plan of Care (Signed)
  Problem: Clinical Measurements: Goal: Ability to maintain clinical measurements within normal limits will improve Outcome: Progressing Goal: Will remain free from infection Outcome: Progressing   Problem: Activity: Goal: Risk for activity intolerance will decrease Outcome: Progressing   Problem: Nutrition: Goal: Adequate nutrition will be maintained Outcome: Progressing   Problem: Pain Management: Goal: General experience of comfort will improve Outcome: Progressing   Problem: Safety: Goal: Ability to remain free from injury will improve Outcome: Progressing   Problem: Skin Integrity: Goal: Risk for impaired skin integrity will decrease Outcome: Progressing

## 2023-04-24 NOTE — Progress Notes (Signed)
[facility] PROGRESS NOTE    Progress Note  NAVADA Thomas  ZOX:[id number] DOB: 08/28/1927 DOA: 04/22/2023 PCP: Gweneth Dimitri, MD     Brief Narrative:   Danielle Thomas is an 89 y.o. female past medical history of essential hypertension, chronic atrial fibrillation on Eliquis, chronic systolic and diastolic heart failure, osteoporosis comes to the hospital after right hip pain following a fall, hip x-ray showed comminuted intertrochanteric right proximal fracture    Assessment/Plan:   Closed fracture of right femur, unspecified fracture morphology, initial encounter [facility]): Became bradycardic, unresponsive as well as hypoxic likely due to fentanyl and ketamine..  Given Narcan and now resolved. Status post right intertrochanteric hip fracture intervention with intramedullary nailing. Narcotics and anticoagulation per orthopedic surgery. PT/OT the patient is pending, anticipate rehab.  Chronic atrial fibrillation (HCC) Continue to hold Eliquis, last dose 04/22/2023. Orthopedic surgery to dictate when to restart Eliquis.  Essential hypertension: Now on a diet, continue to hold Lasix and ACE inhibitor.  Blood pressure is borderline to stable..  Acute kidney injury: With a baseline creatinine of less than 1 on admission 1.4. Hold NSAIDs and diuretic therapy. Creatinine remains 1.4 continue to hold diuretic therapy. Recheck basic metabolic panel in the morning.  Resume IV fluids for an additional 12 hours.  Acute hypoxic respiratory failure (HCC) Likely due to IV narcotics. Use very low-dose of morphine.  Chronic combined systolic (congestive) and diastolic (congestive) heart failure (HCC) Will be judicious with IV fluids.  Last 2D echo in 2024 showed an EF of 45% Holding ARB and Lasix.  Traumatic Hematuria: Hold Eliquis.  Borderline hyperkalemia: Hold potassium supplementation started on IV fluids recheck basic metabolic panel in the morning.  Leukocytosis: Likely  reactive due to surgical intervention.  Normocytic anemia/blood loss anemia: Postsurgical intervention. Recheck a CBC tomorrow morning. Check an anemia panel RDW slightly elevated.  DVT prophylaxis: none, resume Eliquis post surgical intervention. Family Communication:none Status is: Inpatient Remains inpatient appropriate because: Acute comminuted right femur fracture    Code Status:     Code Status Orders  (From admission, onward)           Start     Ordered   04/22/23 2006  Do not attempt resuscitation (DNR)- Limited -Do Not Intubate (DNI)  Continuous       Question Answer Comment  If pulseless and not breathing No CPR or chest compressions.   In Pre-Arrest Conditions (Patient Is Breathing and Has A Pulse) Do not intubate. Provide all appropriate non-invasive medical interventions. Avoid ICU transfer unless indicated or required.   Consent: Discussion documented in EHR or advanced directives reviewed      04/22/23 2007           Code Status History     Date Active Date Inactive Code Status Order ID Comments User Context   07/14/2022 0206 07/21/2022 0246 DNR [id number]  Gery Pray, MD ED   07/14/2022 0019 07/14/2022 0206 Full Code [id number]  Gery Pray, MD ED   07/14/2022 0016 07/14/2022 0019 Full Code [id number]  Gery Pray, MD ED   06/04/2020 1059 07/13/2022 1839 DNR [id number]  Bufford Spikes L Outpatient   05/13/2020 1552 05/21/2020 1944 Full Code [id number]  Charlton Amor, PA-C Inpatient   05/13/2020 1552 05/13/2020 1552 Partial Code [id number]  Charlton Amor, PA-C Inpatient   05/01/2020 2044 05/13/2020 1503 Partial Code [id number]  Charlsie Quest, MD Inpatient   03/27/2020 1607 03/31/2020 0424 Full Code [id number]  Teddy Spike, DO Inpatient  Advance Directive Documentation    Flowsheet Row Most Recent Value  Type of Advance Directive Healthcare Power of Attorney  Pre-existing out of facility DNR order (yellow form or pink MOST form) --  "MOST" Form in  Place? --         IV Access:   Peripheral IV   Procedures and diagnostic studies:   DG FEMUR PORT, MIN 2 VIEWS RIGHT Result Date: 04/23/2023 CLINICAL DATA:  Postop, fracture EXAM: RIGHT FEMUR PORTABLE 2 VIEW COMPARISON:  Preoperative imaging FINDINGS: Femoral intramedullary nail with trans trochanteric and distal locking screw fixation traverse proximal femur fracture. Improved fracture alignment from preoperative imaging. Persistent displacement of the lesser trochanteric fracture fragment. Recent postsurgical change includes air and edema in the soft tissues. IMPRESSION: ORIF of proximal femur fracture with improved alignment. Electronically Signed   By: Narda Rutherford M.D.   On: 04/23/2023 15:16   DG FEMUR, MIN 2 VIEWS RIGHT Result Date: 04/23/2023 CLINICAL DATA:  Elective surgery. EXAM: RIGHT FEMUR 2 VIEWS COMPARISON:  Preoperative imaging. FINDINGS: Six fluoroscopic spot views of the right femur obtained in the operating room. Sequential images during femoral intramedullary nail with distal locking and trans trochanteric screw fixation of proximal femur fracture. Fluoroscopy time 1 minutes 28 seconds. Dose 13.1 mGy. IMPRESSION: Intraoperative fluoroscopy during proximal femur fracture fixation. Electronically Signed   By: Narda Rutherford M.D.   On: 04/23/2023 15:15   DG C-Arm 1-60 Min-No Report Result Date: 04/23/2023 Fluoroscopy was utilized by the requesting physician.  No radiographic interpretation.   DG C-Arm 1-60 Min-No Report Result Date: 04/23/2023 Fluoroscopy was utilized by the requesting physician.  No radiographic interpretation.   CT Head Wo Contrast Result Date: 04/22/2023 CLINICAL DATA:  Altered mental status. EXAM: CT HEAD WITHOUT CONTRAST CT CERVICAL SPINE WITHOUT CONTRAST TECHNIQUE: Multidetector CT imaging of the head and cervical spine was performed following the standard protocol without intravenous contrast. Multiplanar CT image reconstructions of the  cervical spine were also generated. RADIATION DOSE REDUCTION: This exam was performed according to the departmental dose-optimization program which includes automated exposure control, adjustment of the mA and/or kV according to patient size and/or use of iterative reconstruction technique. COMPARISON:  None Available. FINDINGS: CT HEAD FINDINGS Brain: There is periventricular white matter decreased attenuation consistent with small vessel ischemic changes. Ventricles, sulci and cisterns are prominent consistent with age related involutional changes. No acute intracranial hemorrhage, mass effect or shift. No hydrocephalus. Vascular: No hyperdense vessel or unexpected calcification. Skull: Normal. Negative for fracture or focal lesion. Sinuses/Orbits: No acute finding. CT CERVICAL SPINE FINDINGS Alignment: Normal. Skull base and vertebrae: No acute fracture. No primary bone lesion or focal pathologic process. Soft tissues and spinal canal: No prevertebral fluid or swelling. No visible canal hematoma. Disc levels: Extensive degenerative disc disease identified at each cervical level. Especially large marginal osteophyte formation noted at C5-6. There is osteoarthritis at C1-C2. Upper chest: Pleural/parenchymal opacities consistent with scarring in the apices. IMPRESSION: 1. Atrophy and chronic small vessel ischemic changes. No acute intracranial process identified. 2. Degenerative changes of the cervical spine. No acute osseous abnormalities. Electronically Signed   By: Layla Maw M.D.   On: 04/22/2023 18:43   CT Cervical Spine Wo Contrast Result Date: 04/22/2023 CLINICAL DATA:  Altered mental status. EXAM: CT HEAD WITHOUT CONTRAST CT CERVICAL SPINE WITHOUT CONTRAST TECHNIQUE: Multidetector CT imaging of the head and cervical spine was performed following the standard protocol without intravenous contrast. Multiplanar CT image reconstructions of the cervical spine were also generated. RADIATION  DOSE  REDUCTION: This exam was performed according to the departmental dose-optimization program which includes automated exposure control, adjustment of the mA and/or kV according to patient size and/or use of iterative reconstruction technique. COMPARISON:  None Available. FINDINGS: CT HEAD FINDINGS Brain: There is periventricular white matter decreased attenuation consistent with small vessel ischemic changes. Ventricles, sulci and cisterns are prominent consistent with age related involutional changes. No acute intracranial hemorrhage, mass effect or shift. No hydrocephalus. Vascular: No hyperdense vessel or unexpected calcification. Skull: Normal. Negative for fracture or focal lesion. Sinuses/Orbits: No acute finding. CT CERVICAL SPINE FINDINGS Alignment: Normal. Skull base and vertebrae: No acute fracture. No primary bone lesion or focal pathologic process. Soft tissues and spinal canal: No prevertebral fluid or swelling. No visible canal hematoma. Disc levels: Extensive degenerative disc disease identified at each cervical level. Especially large marginal osteophyte formation noted at C5-6. There is osteoarthritis at C1-C2. Upper chest: Pleural/parenchymal opacities consistent with scarring in the apices. IMPRESSION: 1. Atrophy and chronic small vessel ischemic changes. No acute intracranial process identified. 2. Degenerative changes of the cervical spine. No acute osseous abnormalities. Electronically Signed   By: Layla Maw M.D.   On: 04/22/2023 18:43   DG Chest Port 1 View Result Date: 04/22/2023 CLINICAL DATA:  Shortness of breath EXAM: PORTABLE CHEST 1 VIEW COMPARISON:  07/15/2022 FINDINGS: Cardiomegaly. Differential densities along the left cardiac silhouette are new since 07/15/2022. Aortic atherosclerotic calcification. Diffuse bilateral interstitial opacities. Pulmonary vascular congestion. No pleural effusion or pneumothorax. Sutures project over the right lower lung. Thoracolumbar fusion  hardware. IMPRESSION: 1. Cardiomegaly with pulmonary edema. 2. Differential densities along the left cardiac silhouette are new since 07/15/2022. This may be related to patient rotation however pericardial effusion is difficult to exclude. Consider CT for further evaluation. Electronically Signed   By: Minerva Fester M.D.   On: 04/22/2023 17:05   DG Pelvis 1-2 Views Result Date: 04/22/2023 CLINICAL DATA:  [id number] Fall 190176 EXAM: PELVIS - 1-2 VIEW COMPARISON:  None Available. FINDINGS: Comminuted intertrochanteric proximal right femur fracture with mild impaction, apex lateral angulation and mild retraction of the lesser trochanter fracture fragment. No evidence of hip dislocation on these frontal views. No focal osseous lesions. IMPRESSION: Comminuted intertrochanteric proximal right femur fracture. Electronically Signed   By: Delbert Phenix M.D.   On: 04/22/2023 16:29   DG Femur Min 2 Views Right Result Date: 04/22/2023 CLINICAL DATA:  Fall EXAM: RIGHT FEMUR 2 VIEWS COMPARISON:  None Available. FINDINGS: Comminuted intertrochanteric proximal right femur fracture with apex lateral angulation and mild impaction. No focal osseous lesions. No evidence of dislocation at the right hip or right knee on these limited views. IMPRESSION: Comminuted intertrochanteric proximal right femur fracture. Electronically Signed   By: Delbert Phenix M.D.   On: 04/22/2023 16:28     Medical Consultants:   None.   Subjective:    JOELI MASTELLONE relates her pain is controlled has not had a bowel movement.  Objective:    Vitals:   04/23/23 2225 04/24/23 0148 04/24/23 0416 04/24/23 0718  BP: 119/62 (!) 95/47 (!) 92/52 122/62  Pulse: 74 (!) 55 60   Resp: (!) 22  15   Temp: 98 F (36.7 C) 98.2 F (36.8 C) 97.9 F (36.6 C) 98 F (36.7 C)  TempSrc: Oral Oral Oral Oral  SpO2: 93%  93%   Weight:      Height:       SpO2: 93 % O2 Flow Rate (L/min): 2 L/min   Intake/Output  Summary (Last 24 hours) at 04/24/2023  0925 Last data filed at 04/23/2023 1438 Gross per 24 hour  Intake 1040 ml  Output 100 ml  Net 940 ml   Filed Weights   04/22/23 1545 04/23/23 0917  Weight: 55.8 kg 55.7 kg    Exam: General exam: In no acute distress. Respiratory system: Good air movement and clear to auscultation. Cardiovascular system: S1 & S2 heard, RRR. No JVD. Gastrointestinal system: Abdomen is nondistended, soft and nontender.  Extremities: No pedal edema. Skin: No rashes, lesions or ulcers Psychiatry: Judgement and insight appear normal. Mood & affect appropriate. Data Reviewed:    Labs: Basic Metabolic Panel: Recent Labs  Lab 04/22/23 1555 04/22/23 1639 04/23/23 0841 04/24/23 0600  NA 136 138 139 137  K 4.0 4.5 4.7 5.0  CL 107  --  103 107  CO2 25  --  27 23  GLUCOSE 150*  --  88 126*  BUN 35*  --  39* 45*  CREATININE 1.46*  --  1.59* 1.45*  CALCIUM 7.9*  --  8.9 8.0*   GFR Estimated Creatinine Clearance: 18.3 mL/min (A) (by C-G formula based on SCr of 1.45 mg/dL (H)). Liver Function Tests: No results for input(s): "AST", "ALT", "ALKPHOS", "BILITOT", "PROT", "ALBUMIN" in the last 168 hours. No results for input(s): "LIPASE", "AMYLASE" in the last 168 hours. No results for input(s): "AMMONIA" in the last 168 hours. Coagulation profile No results for input(s): "INR", "PROTIME" in the last 168 hours. COVID-19 Labs  No results for input(s): "DDIMER", "FERRITIN", "LDH", "CRP" in the last 72 hours.  Lab Results  Component Value Date   SARSCOV2NAA NEGATIVE 07/13/2022   SARSCOV2NAA NEGATIVE 05/01/2020   SARSCOV2NAA NEGATIVE 03/30/2020   SARSCOV2NAA NEGATIVE 03/27/2020    CBC: Recent Labs  Lab 04/22/23 1555 04/22/23 1639 04/24/23 0600  WBC 10.4  --  13.7*  NEUTROABS 8.5*  --   --   HGB 12.6 13.6 10.7*  HCT 42.3 40.0 34.7*  MCV 90.4  --  89.2  PLT 238  --  171   Cardiac Enzymes: No results for input(s): "CKTOTAL", "CKMB", "CKMBINDEX", "TROPONINI" in the last 168 hours. BNP  (last 3 results) No results for input(s): "PROBNP" in the last 8760 hours. CBG: No results for input(s): "GLUCAP" in the last 168 hours. D-Dimer: No results for input(s): "DDIMER" in the last 72 hours. Hgb A1c: No results for input(s): "HGBA1C" in the last 72 hours. Lipid Profile: No results for input(s): "CHOL", "HDL", "LDLCALC", "TRIG", "CHOLHDL", "LDLDIRECT" in the last 72 hours. Thyroid function studies: No results for input(s): "TSH", "T4TOTAL", "T3FREE", "THYROIDAB" in the last 72 hours.  Invalid input(s): "FREET3" Anemia work up: No results for input(s): "VITAMINB12", "FOLATE", "FERRITIN", "TIBC", "IRON", "RETICCTPCT" in the last 72 hours. Sepsis Labs: Recent Labs  Lab 04/22/23 1555 04/24/23 0600  WBC 10.4 13.7*   Microbiology Recent Results (from the past 240 hours)  Surgical PCR screen     Status: None   Collection Time: 04/22/23 11:04 PM   Specimen: Nasal Mucosa; Nasal Swab  Result Value Ref Range Status   MRSA, PCR NEGATIVE NEGATIVE Final   Staphylococcus aureus NEGATIVE NEGATIVE Final    Comment: (NOTE) The Xpert SA Assay (FDA approved for NASAL specimens in patients 62 years of age and older), is one component of a comprehensive surveillance program. It is not intended to diagnose infection nor to guide or monitor treatment. Performed at [facility], [address], [place], [place] [address]  Medications:    apixaban  2.5 mg Oral BID   docusate sodium  100 mg Oral BID   dorzolamide  1 drop Both Eyes BID   latanoprost  1 drop Both Eyes QHS   lidocaine  1 patch Transdermal Q24H   polyethylene glycol  17 g Oral BID   potassium chloride SA  20 mEq Oral Daily   senna  1 tablet Oral BID   Continuous Infusions:  sodium chloride 75 mL/hr at 04/24/23 0850   sodium chloride        LOS: 2 days   Marinda Elk  [facility]  04/24/2023, 9:25 AM

## 2023-04-24 NOTE — Evaluation (Signed)
Physical Therapy Evaluation  Patient Details Name: Danielle Thomas MRN: [id number] DOB: 07/03/1927 Today's Date: 04/24/2023  History of Present Illness  Pt is a 89 y/o female presenting on 12/15 with R hip pain after fall going up stairs. Pt found with R femur comminuted intertrochanteric fx, chest xray with cardiomegaly with pulmonary edema and questionable pericardial effusion. 12/16 S/P IM nail R hip, WBAT.  PMH includes CHF, HTN, afib, RLS, osteoporosis, compression fx.   Clinical Impression  Pt admitted with above diagnosis. Pt currently with functional limitations due to the deficits listed below (see PT Problem List). At the time of PT eval pt was very fearful of falling and required heavy +2 assist for all mobility attempts. Noted that AIR not pursuing at this time, and recommend continued skilled therapy services at d/c <3 hours/day to maximize independence, safety, and facilitate return home with family. Daughter present and expresses concern with SNF level rehab 2 poor prior experiences. States family may choose to take pt home at d/c and care for pt if no other option except SNF. If this is the case, may want to consider max HH services and DME. Acutely, pt will benefit from skilled PT to increase their independence and safety with mobility to allow discharge.           If plan is discharge home, recommend the following: Two people to help with walking and/or transfers;Two people to help with bathing/dressing/bathroom;Assistance with cooking/housework;Direct supervision/assist for financial management;Assist for transportation;Supervision due to cognitive status;Direct supervision/assist for medications management   Can travel by private vehicle   No    Equipment Recommendations Hospital bed;Hoyer lift;Wheelchair (measurements PT);Wheelchair cushion (measurements PT)  Recommendations for Other Services       Functional Status Assessment Patient has had a recent decline in their  functional status and demonstrates the ability to make significant improvements in function in a reasonable and predictable amount of time.     Precautions / Restrictions Precautions Precautions: Fall Precaution Comments: Incontinent of urine Restrictions Weight Bearing Restrictions Per Provider Order: Yes RLE Weight Bearing Per Provider Order: Weight bearing as tolerated      Mobility  Bed Mobility Overal bed mobility: Needs Assistance Bed Mobility: Supine to Sit     Supine to sit: Mod assist, +2 for physical assistance     General bed mobility comments: assist for R LE and hips to EOB, min assist to guide trunk to upright    Transfers Overall transfer level: Needs assistance Equipment used: Rolling walker (2 wheels), 2 person hand held assist Transfers: Sit to/from Stand, Bed to chair/wheelchair/BSC Sit to Stand: Max assist, +2 physical assistance Stand pivot transfers: Max assist, +2 physical assistance         General transfer comment: standing with RW and bil hand held support, max assist +2; pt fearful of falling and improved stability with bil hand held support.  pivoting to recliner with max assist +2 as patient becomes fearful and has difficulty following commands to sequences pivot to recliner.    Ambulation/Gait               General Gait Details: Unbale to progress to gait training at this time.  Stairs            Wheelchair Mobility     Tilt Bed    Modified Rankin (Stroke Patients Only)       Balance Overall balance assessment: Needs assistance Sitting-balance support: No upper extremity supported, Feet supported Sitting balance-Leahy Scale: Fair Sitting  balance - Comments: min guard, limited dynamically   Standing balance support: Bilateral upper extremity supported, During functional activity Standing balance-Leahy Scale: Poor Standing balance comment: relies on BUE and external suppport                              Pertinent Vitals/Pain Pain Assessment Pain Assessment: Faces Faces Pain Scale: Hurts little more Pain Location: R LE Pain Descriptors / Indicators: Discomfort, Grimacing, Operative site guarding Pain Intervention(s): Limited activity within patient's tolerance, Monitored during session, Repositioned    Home Living Family/patient expects to be discharged to:: Private residence Living Arrangements: Alone Available Help at Discharge: Family;Available 24 hours/day Type of Home: House Home Access: Stairs to enter Entrance Stairs-Rails: None Entrance Stairs-Number of Steps: 2 Alternate Level Stairs-Number of Steps: 13 Home Layout: Two level;Able to live on main level with bedroom/bathroom;1/2 bath on main level Home Equipment: Rollator (4 wheels);Cane - single Librarian, academic (2 wheels);Shower seat;Hand held shower head;BSC/3in1;Toilet riser      Prior Function Prior Level of Function : Independent/Modified Independent             Mobility Comments: ambulates with use of rollator and RW, uses cane when ambulating to car ADLs Comments: reports managing ADLs without assist, light IADLs, driving     Extremity/Trunk Assessment   Upper Extremity Assessment Upper Extremity Assessment: Generalized weakness    Lower Extremity Assessment Lower Extremity Assessment: Generalized weakness;RLE deficits/detail RLE Deficits / Details: Acute pain, decreased strength and AROM consistent with above mentioned injury and subsequent surgery. RLE: Unable to fully assess due to pain    Cervical / Trunk Assessment Cervical / Trunk Assessment: Kyphotic  Communication   Communication Communication: Hearing impairment (hearing aid on R) Cueing Techniques: Gestural cues;Verbal cues  Cognition Arousal: Alert Behavior During Therapy: Anxious Overall Cognitive Status: Impaired/Different from baseline Area of Impairment: Problem solving                             Problem  Solving: Slow processing, Requires verbal cues General Comments: requires cueing for sequencing and problem solving, requires repeated cues but is HOH        General Comments General comments (skin integrity, edema, etc.): daughter at side and supportive    Exercises     Assessment/Plan    PT Assessment Patient needs continued PT services  PT Problem List Decreased strength;Decreased activity tolerance;Decreased balance;Decreased mobility;Decreased range of motion;Decreased coordination;Decreased cognition;Decreased knowledge of use of DME;Decreased safety awareness;Decreased knowledge of precautions;Pain       PT Treatment Interventions DME instruction;Gait training;Functional mobility training;Therapeutic exercise;Therapeutic activities;Balance training;Patient/family education    PT Goals (Current goals can be found in the Care Plan section)  Acute Rehab PT Goals Patient Stated Goal: Be able to return home PT Goal Formulation: With patient/family Time For Goal Achievement: 05/08/23 Potential to Achieve Goals: Good    Frequency Min 1X/week     Co-evaluation PT/OT/SLP Co-Evaluation/Treatment: Yes Reason for Co-Treatment: For patient/therapist safety;To address functional/ADL transfers PT goals addressed during session: Mobility/safety with mobility;Balance;Proper use of DME;Strengthening/ROM OT goals addressed during session: ADL's and self-care       AM-PAC PT "6 Clicks" Mobility  Outcome Measure Help needed turning from your back to your side while in a flat bed without using bedrails?: Total Help needed moving from lying on your back to sitting on the side of a flat bed without using bedrails?: Total Help  needed moving to and from a bed to a chair (including a wheelchair)?: Total Help needed standing up from a chair using your arms (e.g., wheelchair or bedside chair)?: Total Help needed to walk in hospital room?: Total Help needed climbing 3-5 steps with a railing? :  Total 6 Click Score: 6    End of Session Equipment Utilized During Treatment: Gait belt Activity Tolerance: Patient tolerated treatment well Patient left: in chair;with call bell/phone within reach;with chair alarm set;with family/visitor present Nurse Communication: Mobility status PT Visit Diagnosis: Unsteadiness on feet (R26.81);Pain Pain - Right/Left: Right Pain - part of body: Hip    Time: 1115-1150 PT Time Calculation (min) (ACUTE ONLY): 35 min   Charges:   PT Evaluation $PT Eval Moderate Complexity: 1 Mod   PT General Charges $$ ACUTE PT VISIT: 1 Visit         Conni Slipper, PT, DPT Acute Rehabilitation Services Secure Chat Preferred Office: [phone]   Marylynn Pearson 04/24/2023, 2:11 PM

## 2023-04-25 DIAGNOSIS — I10 Essential (primary) hypertension: Secondary | ICD-10-CM | POA: Diagnosis not present

## 2023-04-25 DIAGNOSIS — I4811 Longstanding persistent atrial fibrillation: Secondary | ICD-10-CM | POA: Diagnosis not present

## 2023-04-25 DIAGNOSIS — N179 Acute kidney failure, unspecified: Secondary | ICD-10-CM | POA: Diagnosis not present

## 2023-04-25 DIAGNOSIS — S7291XA Unspecified fracture of right femur, initial encounter for closed fracture: Secondary | ICD-10-CM | POA: Diagnosis not present

## 2023-04-25 LAB — BASIC METABOLIC PANEL
Anion gap: 4 — ABNORMAL LOW (ref 5–15)
BUN: 41 mg/dL — ABNORMAL HIGH (ref 8–23)
CO2: 25 mmol/L (ref 22–32)
Calcium: 7.9 mg/dL — ABNORMAL LOW (ref 8.9–10.3)
Chloride: 110 mmol/L (ref 98–111)
Creatinine, Ser: 1.08 mg/dL — ABNORMAL HIGH (ref 0.44–1.00)
GFR, Estimated: 48 mL/min — ABNORMAL LOW (ref >60)
Glucose, Bld: 97 mg/dL (ref 70–99)
Potassium: 4.6 mmol/L (ref 3.5–5.1)
Sodium: 139 mmol/L (ref 135–145)

## 2023-04-25 LAB — CBC
HCT: 31.5 % — ABNORMAL LOW (ref 36.0–46.0)
Hemoglobin: 9.8 g/dL — ABNORMAL LOW (ref 12.0–15.0)
MCH: 27.3 pg (ref 26.0–34.0)
MCHC: 31.1 g/dL (ref 30.0–36.0)
MCV: 87.7 fL (ref 80.0–100.0)
Platelets: 147 10*3/uL — ABNORMAL LOW (ref 150–400)
RBC: 3.59 MIL/uL — ABNORMAL LOW (ref 3.87–5.11)
RDW: 17.7 % — ABNORMAL HIGH (ref 11.5–15.5)
WBC: 10.7 10*3/uL — ABNORMAL HIGH (ref 4.0–10.5)
nRBC: 0 % (ref 0.0–0.2)

## 2023-04-25 MED ORDER — MORPHINE SULFATE (PF) 2 MG/ML IV SOLN
1.0000 mg | INTRAVENOUS | Status: DC | PRN
  Filled 2023-04-25: qty 1

## 2023-04-25 MED ORDER — POLYETHYLENE GLYCOL 3350 17 G PO PACK
17.0000 g | PACK | Freq: Two times a day (BID) | ORAL | Status: DC
  Administered 2023-04-25 – 2023-04-26 (×3): 17 g via ORAL
  Filled 2023-04-25 (×3): qty 1

## 2023-04-25 NOTE — Plan of Care (Signed)
  Problem: Education: Goal: Knowledge of General Education information will improve Description: Including pain rating scale, medication(s)/side effects and non-pharmacologic comfort measures Outcome: Progressing   Problem: Activity: Goal: Risk for activity intolerance will decrease Outcome: Progressing   Problem: Nutrition: Goal: Adequate nutrition will be maintained Outcome: Progressing   Problem: Coping: Goal: Level of anxiety will decrease Outcome: Progressing   Problem: Pain Management: Goal: General experience of comfort will improve Outcome: Progressing   Problem: Safety: Goal: Ability to remain free from injury will improve Outcome: Progressing   Problem: Skin Integrity: Goal: Risk for impaired skin integrity will decrease Outcome: Progressing

## 2023-04-25 NOTE — TOC Progression Note (Addendum)
Transition of Care [facility]) - Progression Note    Patient Details  Name: Danielle Thomas MRN: [id number] Date of Birth: 06/08/1927  [facility]) CM/SW Contact  Lorri Frederick, LCSW Phone Number: 04/25/2023, 9:58 AM  Clinical Narrative:   Message from [facility], they are full. Bed offers provided to daughter Olegario Messier, she will talk with her siblings and make SNF choice.  1030: Olegario Messier does want to accept offer at [facility].  CSW made her aware of extra $46 per day charge and she is agreeable to this.    Per MD, pt potential DC tomorrow. CSW confirmed with [facility] and she can receive pt tomorrow, room 122.  Auth request submitted in [place] and approved: [id number], 5 days: 12/19-12/23.   Expected Discharge Plan: Skilled Nursing Facility Barriers to Discharge: SNF Pending bed offer  Expected Discharge Plan and Services In-house Referral: Clinical Social Work   Post Acute Care Choice: Skilled Nursing Facility Living arrangements for the past 2 months: Single Family Home                                       Social Determinants of Health (SDOH) Interventions SDOH Screenings   Food Insecurity: No Food Insecurity (04/22/2023)  Housing: Low Risk  (04/22/2023)  Transportation Needs: No Transportation Needs (04/22/2023)  Utilities: Not At Risk (04/22/2023)  Tobacco Use: Low Risk  (04/23/2023)    Readmission Risk Interventions     No data to display

## 2023-04-25 NOTE — Progress Notes (Addendum)
Mobility Specialist: Progress Note   04/24/23 1553  Mobility  Activity Transferred from chair to bed  Level of Assistance +2 (takes two people)  Assistive Device Stedy  RLE Weight Bearing Per Provider Order WBAT  Activity Response Tolerated well  Mobility Referral Yes  Mobility visit 1 Mobility  Mobility Specialist Start Time (ACUTE ONLY) 1513  Mobility Specialist Stop Time (ACUTE ONLY) 1528  Mobility Specialist Time Calculation (min) (ACUTE ONLY) 15 min    Pt was agreeable to mobility session - received in bed. C/o R hip pain. ModA +2 throughout. Required verbal and tactile cues for taking pivot steps to chair. Left in chair with all needs met, call bell in reach. Daughter in room.   MS returned to transfer pt back to bed. Still c/o R hip pain and feeling stiff in RLE. ModA+2 throughout. Left in bed with all needs met, call bell in reach. Bed alarm on.   Maurene Capes Mobility Specialist Please contact via SecureChat or Rehab office at [phone]

## 2023-04-25 NOTE — Progress Notes (Signed)
[facility] PROGRESS NOTE    Progress Note  Danielle Thomas  UXL:[id number] DOB: [birth year] DOA: 04/22/2023 PCP: Gweneth Dimitri, MD     Brief Narrative:   Danielle Thomas is an 89 y.o. female past medical history of essential hypertension, chronic atrial fibrillation on Eliquis, chronic systolic and diastolic heart failure, osteoporosis comes to the hospital after right hip pain following a fall, hip x-ray showed comminuted intertrochanteric right proximal fracture    Assessment/Plan:   Closed fracture of right femur, unspecified fracture morphology, initial encounter [facility]): Became bradycardic, unresponsive as well as hypoxic likely due to fentanyl and ketamine.  Given Narcan and now resolved. Status post right intertrochanteric hip fracture intervention with intramedullary nailing. Narcotics and anticoagulation per orthopedic surgery. PT/OT the patient, will need nursing solidity placement. She has not had a bowel movement since 3 days prior to admission.  She relates her pain is not controlled this morning.  Chronic atrial fibrillation (HCC) Eliquis has been restarted.  Mildly tachycardic likely due to pain She is currently rate controlled on no AV nodal agents due to A-fib with a slow ventricular rate.  Essential hypertension: Now on a diet, continue to hold Lasix and ACE inhibitor.  Blood pressure is borderline to stable. Continue to hold ARB and diuretic therapy.  Acute kidney injury: With a baseline creatinine of less than 1 on admission 1.4. NSAIDs and diuretic therapy, was held. Creatinine has returned to baseline.  Once oral intake is stabilized can resume diuretics. Tolerating her diet KVO IV fluids.  Acute hypoxic respiratory failure (HCC) Likely due to IV narcotics. Use very low-dose of morphine.  Chronic combined systolic (congestive) and diastolic (congestive) heart failure (HCC) Will be judicious with IV fluids.  Last 2D echo in 2024 showed an EF of  45% Holding ARB and Lasix.  Traumatic Hematuria: Eliquis has been resumed.  Borderline hyperkalemia: Resolved with IV fluids.  Leukocytosis: Likely reactive due to surgical intervention.  Normocytic anemia/blood loss anemia: Postsurgical intervention. Hemoglobin has remained  stable Folate 5.9 ,B12 349  DVT prophylaxis: none, resume Eliquis post surgical intervention. Family Communication:none Status is: Inpatient Remains inpatient appropriate because: Acute comminuted right femur fracture    Code Status:     Code Status Orders  (From admission, onward)           Start     Ordered   04/22/23 2006  Do not attempt resuscitation (DNR)- Limited -Do Not Intubate (DNI)  Continuous       Question Answer Comment  If pulseless and not breathing No CPR or chest compressions.   In Pre-Arrest Conditions (Patient Is Breathing and Has A Pulse) Do not intubate. Provide all appropriate non-invasive medical interventions. Avoid ICU transfer unless indicated or required.   Consent: Discussion documented in EHR or advanced directives reviewed      04/22/23 2007           Code Status History     Date Active Date Inactive Code Status Order ID Comments User Context   07/14/2022 0206 07/21/2022 0246 DNR [id number]  Gery Pray, MD ED   07/14/2022 0019 07/14/2022 0206 Full Code [id number]  Gery Pray, MD ED   07/14/2022 0016 07/14/2022 0019 Full Code [id number]  Gery Pray, MD ED   06/04/2020 1059 07/13/2022 1839 DNR [id number]  Bufford Spikes L Outpatient   05/13/2020 1552 05/21/2020 1944 Full Code [id number]  Charlton Amor, PA-C Inpatient   05/13/2020 1552 05/13/2020 1552 Partial Code [id number]  Charlton Amor, PA-C Inpatient  05/01/2020 2044 05/13/2020 1503 Partial Code [id number]  Charlsie Quest, MD Inpatient   03/27/2020 1607 03/31/2020 0424 Full Code [id number]  Teddy Spike, DO Inpatient      Advance Directive Documentation    Flowsheet Row Most Recent Value  Type of Advance  Directive Healthcare Power of Attorney  Pre-existing out of facility DNR order (yellow form or pink MOST form) --  "MOST" Form in Place? --         IV Access:   Peripheral IV   Procedures and diagnostic studies:   DG FEMUR PORT, MIN 2 VIEWS RIGHT Result Date: 04/23/2023 CLINICAL DATA:  Postop, fracture EXAM: RIGHT FEMUR PORTABLE 2 VIEW COMPARISON:  Preoperative imaging FINDINGS: Femoral intramedullary nail with trans trochanteric and distal locking screw fixation traverse proximal femur fracture. Improved fracture alignment from preoperative imaging. Persistent displacement of the lesser trochanteric fracture fragment. Recent postsurgical change includes air and edema in the soft tissues. IMPRESSION: ORIF of proximal femur fracture with improved alignment. Electronically Signed   By: Narda Rutherford M.D.   On: 04/23/2023 15:16   DG FEMUR, MIN 2 VIEWS RIGHT Result Date: 04/23/2023 CLINICAL DATA:  Elective surgery. EXAM: RIGHT FEMUR 2 VIEWS COMPARISON:  Preoperative imaging. FINDINGS: Six fluoroscopic spot views of the right femur obtained in the operating room. Sequential images during femoral intramedullary nail with distal locking and trans trochanteric screw fixation of proximal femur fracture. Fluoroscopy time 1 minutes 28 seconds. Dose 13.1 mGy. IMPRESSION: Intraoperative fluoroscopy during proximal femur fracture fixation. Electronically Signed   By: Narda Rutherford M.D.   On: 04/23/2023 15:15   DG C-Arm 1-60 Min-No Report Result Date: 04/23/2023 Fluoroscopy was utilized by the requesting physician.  No radiographic interpretation.   DG C-Arm 1-60 Min-No Report Result Date: 04/23/2023 Fluoroscopy was utilized by the requesting physician.  No radiographic interpretation.     Medical Consultants:   None.   Subjective:    Danielle Thomas is her pain is not controlled has not had a bowel movement.  Objective:    Vitals:   04/24/23 1332 04/24/23 2006 04/25/23 0514  04/25/23 0752  BP: (!) 124/58 126/65 124/62 (!) 107/58  Pulse:  69 72   Resp: 16  19   Temp:  (!) 97.5 F (36.4 C) 97.9 F (36.6 C) 97.6 F (36.4 C)  TempSrc:  Oral Oral Oral  SpO2:  100% 100%   Weight:      Height:       SpO2: 100 % O2 Flow Rate (L/min): 2 L/min   Intake/Output Summary (Last 24 hours) at 04/25/2023 0942 Last data filed at 04/24/2023 2006 Gross per 24 hour  Intake 480 ml  Output 450 ml  Net 30 ml   Filed Weights   04/22/23 1545 04/23/23 0917  Weight: 55.8 kg 55.7 kg    Exam: General exam: In no acute distress. Respiratory system: Good air movement and clear to auscultation. Cardiovascular system: S1 & S2 heard, RRR. No JVD. Gastrointestinal system: Abdomen is nondistended, soft and nontender.  Extremities: No pedal edema. Skin: No rashes, lesions or ulcers Psychiatry: Judgement and insight appear normal. Mood & affect appropriate. Data Reviewed:    Labs: Basic Metabolic Panel: Recent Labs  Lab 04/22/23 1555 04/22/23 1639 04/23/23 0841 04/24/23 0600 04/25/23 0641  NA 136 138 139 137 139  K 4.0 4.5 4.7 5.0 4.6  CL 107  --  103 107 110  CO2 25  --  27 23 25   GLUCOSE 150*  --  88  126* 97  BUN 35*  --  39* 45* 41*  CREATININE 1.46*  --  1.59* 1.45* 1.08*  CALCIUM 7.9*  --  8.9 8.0* 7.9*   GFR Estimated Creatinine Clearance: 24.6 mL/min (A) (by C-G formula based on SCr of 1.08 mg/dL (H)). Liver Function Tests: No results for input(s): "AST", "ALT", "ALKPHOS", "BILITOT", "PROT", "ALBUMIN" in the last 168 hours. No results for input(s): "LIPASE", "AMYLASE" in the last 168 hours. No results for input(s): "AMMONIA" in the last 168 hours. Coagulation profile No results for input(s): "INR", "PROTIME" in the last 168 hours. COVID-19 Labs  Recent Labs    04/24/23 1018  FERRITIN 167    Lab Results  Component Value Date   SARSCOV2NAA NEGATIVE 07/13/2022   SARSCOV2NAA NEGATIVE 05/01/2020   SARSCOV2NAA NEGATIVE 03/30/2020   SARSCOV2NAA  NEGATIVE 03/27/2020    CBC: Recent Labs  Lab 04/22/23 1555 04/22/23 1639 04/24/23 0600 04/25/23 0641  WBC 10.4  --  13.7* 10.7*  NEUTROABS 8.5*  --   --   --   HGB 12.6 13.6 10.7* 9.8*  HCT 42.3 40.0 34.7* 31.5*  MCV 90.4  --  89.2 87.7  PLT 238  --  171 147*   Cardiac Enzymes: No results for input(s): "CKTOTAL", "CKMB", "CKMBINDEX", "TROPONINI" in the last 168 hours. BNP (last 3 results) No results for input(s): "PROBNP" in the last 8760 hours. CBG: No results for input(s): "GLUCAP" in the last 168 hours. D-Dimer: No results for input(s): "DDIMER" in the last 72 hours. Hgb A1c: No results for input(s): "HGBA1C" in the last 72 hours. Lipid Profile: No results for input(s): "CHOL", "HDL", "LDLCALC", "TRIG", "CHOLHDL", "LDLDIRECT" in the last 72 hours. Thyroid function studies: No results for input(s): "TSH", "T4TOTAL", "T3FREE", "THYROIDAB" in the last 72 hours.  Invalid input(s): "FREET3" Anemia work up: Recent Labs    04/24/23 1018  VITAMINB12 349  FOLATE 5.9*  FERRITIN 167  TIBC 235*  IRON 24*  RETICCTPCT 2.0   Sepsis Labs: Recent Labs  Lab 04/22/23 1555 04/24/23 0600 04/25/23 0641  WBC 10.4 13.7* 10.7*   Microbiology Recent Results (from the past 240 hours)  Surgical PCR screen     Status: None   Collection Time: 04/22/23 11:04 PM   Specimen: Nasal Mucosa; Nasal Swab  Result Value Ref Range Status   MRSA, PCR NEGATIVE NEGATIVE Final   Staphylococcus aureus NEGATIVE NEGATIVE Final    Comment: (NOTE) The Xpert SA Assay (FDA approved for NASAL specimens in patients 72 years of age and older), is one component of a comprehensive surveillance program. It is not intended to diagnose infection nor to guide or monitor treatment. Performed at [facility], [address] [place], [place] [address]      Medications:    apixaban  2.5 mg Oral BID   docusate sodium  100 mg Oral BID   dorzolamide  1 drop Both Eyes BID   latanoprost  1 drop Both  Eyes QHS   lidocaine  1 patch Transdermal Q24H   polyethylene glycol  17 g Oral BID   senna  1 tablet Oral BID   Continuous Infusions:  sodium chloride        LOS: 3 days   Marinda Elk  [facility]  04/25/2023, 9:42 AM

## 2023-04-25 NOTE — Plan of Care (Signed)

## 2023-04-25 NOTE — Progress Notes (Signed)
[facility] Progress Note  Patient ID: Danielle Thomas MRN: [id number] DOB/AGE: 1927-07-06 89 y.o.  Subjective:  Ortho issues stable Not CIR candidate  +2 assist for mobility, no gait training yesterday   Will need snf most likely   ROS As above  Objective:   VITALS:   Vitals:   04/24/23 1332 04/24/23 2006 04/25/23 0514 04/25/23 0752  BP: (!) 124/58 126/65 124/62 (!) 107/58  Pulse:  69 72   Resp: 16  19   Temp:  (!) 97.5 F (36.4 C) 97.9 F (36.6 C) 97.6 F (36.4 C)  TempSrc:  Oral Oral Oral  SpO2:  100% 100%   Weight:      Height:        Estimated body mass index is 23.2 kg/m as calculated from the following:   Height as of this encounter: 5\' 1"  (1.549 m).   Weight as of this encounter: 55.7 kg.   Intake/Output      12/17 0701 12/18 0700 12/18 0701 12/19 0700   P.O. 840    I.V. (mL/kg)     IV Piggyback     Total Intake(mL/kg) 840 (15.1)    Urine (mL/kg/hr) 450 (0.3)    Emesis/NG output 0    Stool 0    Blood     Total Output 450    Net +390           LABS  Results for orders placed or performed during the hospital encounter of 04/22/23 (from the past 24 hours)  CBC     Status: Abnormal   Collection Time: 04/25/23  6:41 AM  Result Value Ref Range   WBC 10.7 (H) 4.0 - 10.5 K/uL   RBC 3.59 (L) 3.87 - 5.11 MIL/uL   Hemoglobin 9.8 (L) 12.0 - 15.0 g/dL   HCT 40.9 (L) 81.1 - 91.4 %   MCV 87.7 80.0 - 100.0 fL   MCH 27.3 26.0 - 34.0 pg   MCHC 31.1 30.0 - 36.0 g/dL   RDW 78.2 (H) 95.6 - 21.3 %   Platelets 147 (L) 150 - 400 K/uL   nRBC 0.0 0.0 - 0.2 %  Basic metabolic panel     Status: Abnormal   Collection Time: 04/25/23  6:41 AM  Result Value Ref Range   Sodium 139 135 - 145 mmol/L   Potassium 4.6 3.5 - 5.1 mmol/L   Chloride 110 98 - 111 mmol/L   CO2 25 22 - 32 mmol/L   Glucose, Bld 97 70 - 99 mg/dL   BUN 41 (H) 8 - 23 mg/dL   Creatinine, Ser 0.86 (H) 0.44  - 1.00 mg/dL   Calcium 7.9 (L) 8.9 - 10.3 mg/dL   GFR, Estimated 48 (L) >60 mL/min   Anion gap 4 (L) 5 - 15     PHYSICAL EXAM:  Gen: sitting up in bed, NAD, appears well, pleasant Lungs: unlabored Ext:         Right Lower Extremity Dressing is clean, dry and intact. Scant drainage proximal dressing              Extremity is warm             No DCT             Compartments are soft  No pain out of proportion with passive stretching of his toes or ankle             DPN, SPN, TN sensory functions are intact             EHL, FHL, lesser toe motor functions intact             Ankle flexion, extension, inversion eversion intact             + DP pulse    Assessment/Plan: 2 Days Post-Op   Principal Problem:   Closed fracture of right femur, unspecified fracture morphology, initial encounter (HCC) Active Problems:   Atrial fibrillation (HCC)   Essential hypertension   Acute hypoxic respiratory failure (HCC)   Chronic combined systolic (congestive) and diastolic (congestive) heart failure (HCC)   AKI (acute kidney injury) (HCC)   Anti-infectives (From admission, onward)    Start     Dose/Rate Route Frequency Ordered Stop   04/23/23 0945  ceFAZolin (ANCEF) IVPB 2g/100 mL premix        2 g 200 mL/hr over 30 Minutes Intravenous On call to O.R. 04/23/23 0930 04/23/23 1103   04/23/23 0931  ceFAZolin (ANCEF) 2-4 GM/100ML-% IVPB       Note to Pharmacy: Patsi Sears E: cabinet override      04/23/23 0931 04/23/23 1053     .  POD/HD#: 2  89 y/o female s/p fall with R hip fracture    -R hip fracture s/p IMN              WBAT R leg             ROM ast tolerated R leg              PT and OT evals             Ice PRN              Dressing changes as needed                - Pain management:             Multimodal              Minimize narcotics   - ABL anemia/Hemodynamics             Monitor   - Medical issues              Per primary    - DVT/PE  prophylaxis:             Eliquis restarted    - ID:              Periop abx completed    - Metabolic Bone Disease:             Fragility fracture             Osteoporosis             Vitamin d insufficiency    - Activity:             As above     - Dispo:             Therapies             Not candidate for CIR  Agreeable to SNF  Follow up with ortho in 2 weeks   Mearl Latin, PA-C [phone] (C) 04/25/2023, 10:54 AM  [facility] [address] [place]  [address] [phone] (O) [phone] (F)    After 5pm and on the weekends please log on to Amion, go to orthopaedics and the look under the [facility] Call for the provider(s) on call. You can also call our office at [phone] and then follow the prompts to be connected to the call team.  Patient ID: Danielle Thomas, female   DOB: 08/12/1927, 89 y.o.   MRN: [id number]

## 2023-04-25 NOTE — Care Management Important Message (Signed)
Important Message  Patient Details  Name: Danielle Thomas MRN: [id number] Date of Birth: Oct 23, 1927   Important Message Given:  Yes - Medicare IM     Dorena Bodo 04/25/2023, 3:19 PM

## 2023-04-26 DIAGNOSIS — S7291XA Unspecified fracture of right femur, initial encounter for closed fracture: Secondary | ICD-10-CM | POA: Diagnosis not present

## 2023-04-26 MED ORDER — OXYCODONE-ACETAMINOPHEN 5-325 MG PO TABS
1.0000 | ORAL_TABLET | ORAL | Status: DC | PRN
  Administered 2023-04-26: 1 via ORAL
  Filled 2023-04-26: qty 2

## 2023-04-26 MED ORDER — SMOG ENEMA
960.0000 mL | Freq: Once | RECTAL | Status: DC
  Filled 2023-04-26: qty 960

## 2023-04-26 MED ORDER — POLYETHYLENE GLYCOL 3350 17 G PO PACK: 17.0000 g | PACK | Freq: Two times a day (BID) | ORAL | 0 refills | Status: AC

## 2023-04-26 MED ORDER — OXYCODONE-ACETAMINOPHEN 5-325 MG PO TABS: 1.0000 | ORAL_TABLET | ORAL | 0 refills | Status: AC | PRN

## 2023-04-26 NOTE — Plan of Care (Signed)
  Problem: Education: Goal: Knowledge of General Education information will improve Description: Including pain rating scale, medication(s)/side effects and non-pharmacologic comfort measures Outcome: Progressing   Problem: Clinical Measurements: Goal: Respiratory complications will improve Outcome: Progressing   Problem: Activity: Goal: Risk for activity intolerance will decrease Outcome: Progressing   Problem: Nutrition: Goal: Adequate nutrition will be maintained Outcome: Progressing   Problem: Pain Management: Goal: General experience of comfort will improve Outcome: Progressing   Problem: Safety: Goal: Ability to remain free from injury will improve Outcome: Progressing

## 2023-04-26 NOTE — Plan of Care (Signed)
  Problem: Education: Goal: Knowledge of General Education information will improve Description: Including pain rating scale, medication(s)/side effects and non-pharmacologic comfort measures Outcome: Progressing   Problem: Clinical Measurements: Goal: Respiratory complications will improve Outcome: Progressing   Problem: Activity: Goal: Risk for activity intolerance will decrease Outcome: Progressing   Problem: Nutrition: Goal: Adequate nutrition will be maintained 04/26/2023 0945 by Olga Millers, LPN Outcome: Progressing 04/26/2023 0806 by Olga Millers, LPN Outcome: Progressing   Problem: Coping: Goal: Level of anxiety will decrease Outcome: Progressing   Problem: Pain Management: Goal: General experience of comfort will improve Outcome: Progressing   Problem: Safety: Goal: Ability to remain free from injury will improve 04/26/2023 0945 by Olga Millers, LPN Outcome: Progressing 04/26/2023 0806 by Olga Millers, LPN Outcome: Progressing   Problem: Skin Integrity: Goal: Risk for impaired skin integrity will decrease Outcome: Progressing   Problem: Activity: Goal: Ability to ambulate and perform ADLs will improve Outcome: Progressing

## 2023-04-26 NOTE — Discharge Summary (Signed)
Physician Discharge Summary  Danielle Thomas GMW:[id number] DOB: May 26, 1927 DOA: 04/22/2023  PCP: Gweneth Dimitri, MD  Admit date: 04/22/2023 Discharge date: 04/26/2023  Admitted From: Home Disposition:  SNF  Recommendations for Outpatient Follow-up:  Follow up with PCP in 1-2 weeks Please obtain BMP/CBC in one week  Home Health:No Equipment/Devices:None  Discharge Condition:Stable CODE STATUS:DNR Diet recommendation: Heart Healthy   Brief/Interim Summary: 89 y.o. female past medical history of essential hypertension, chronic atrial fibrillation on Eliquis, chronic systolic and diastolic heart failure, osteoporosis comes to the hospital after right hip pain following a fall, hip x-ray showed comminuted intertrochanteric right proximal fracture   Discharge Diagnoses:  Principal Problem:   Closed fracture of right femur, unspecified fracture morphology, initial encounter (HCC) Active Problems:   Atrial fibrillation (HCC)   Essential hypertension   Acute hypoxic respiratory failure (HCC)   Chronic combined systolic (congestive) and diastolic (congestive) heart failure (HCC)   AKI (acute kidney injury) (HCC)  Close hip fracture of the right femur: Became bradycardic and unresponsive as well as hypoxic in the ED after fentanyl and ketamine. Resolved with IV Narcan. She status post intertrochanteric hip fracture with intramedullary nailing. She started on oral narcotics her pain was controlled. PT OT was consulted, she will need skilled nursing facility placement.  Chronic atrial fibrillation: She is currently on no rate controlling AV nodal agents as she has A-fib with a ventricular slow rate. She was continued on Eliquis.  Central hypertension: No changes made to her medication.  Acute kidney injury: With a baseline creatinine of 1 on admission 1.4 she was started on IV fluids her diuretics were held and her creatinine returned to baseline.  Acute hypoxic respiratory  failure with hypoxia: Likely due to narcotics.  Chronic combined systolic and diastolic heart failure: No changes made to her medication she will resume ARB and Lasix as an outpatient. 2D echo in 2024 showed an EF of 45%.  Traumatic hematuria: Eliquis was held hematuria resolved she resume Eliquis and outpatient.  Leukocytosis: Likely reactive now resolved.  Normocytic anemia/blood loss anemia: Postsurgical intervention hemoglobin is stable. Follow-up with PCP as an outpatient.  Discharge Instructions  Discharge Instructions     Diet - low sodium heart healthy   Complete by: As directed    Increase activity slowly   Complete by: As directed    No wound care   Complete by: As directed       Allergies as of 04/26/2023       Reactions   Amlodipine Besylate    Other reaction(s): high dose make feet swell   Codeine Nausea And Vomiting   Meperidine Nausea Only   Metoprolol Succinate [metoprolol]    Other reaction(s): effects her breathing   Oxycodone Nausea And Vomiting        Medication List     TAKE these medications    acetaminophen 325 MG tablet Commonly known as: TYLENOL Take 650 mg by mouth every 6 (six) hours as needed for mild pain (pain score 1-3).   apixaban 2.5 MG Tabs tablet Commonly known as: ELIQUIS Take 1 tablet (2.5 mg total) by mouth 2 (two) times daily.   dorzolamide 2 % ophthalmic solution Commonly known as: TRUSOPT Place 1 drop into both eyes 2 (two) times daily.   empagliflozin 10 MG Tabs tablet Commonly known as: JARDIANCE Take 1 tablet (10 mg total) by mouth daily.   furosemide 20 MG tablet Commonly known as: LASIX Take 1 tablet (20 mg total) by mouth daily. Take  tablet (20 mg) by mouth daily. May take extra tablet by mouth if weight gain of 3 lbs overnight, 5 lbs in a week, and/or swelling of lower extremities   latanoprost 0.005 % ophthalmic solution Commonly known as: XALATAN Place 1 drop into both eyes at bedtime.    losartan 50 MG tablet Commonly known as: COZAAR TAKE 1 TABLET EVERY DAY (NEW DOSE)   oxyCODONE-acetaminophen 5-325 MG tablet Commonly known as: PERCOCET/ROXICET Take 1-2 tablets by mouth every 4 (four) hours as needed for up to 5 days for moderate pain (pain score 4-6).   polyethylene glycol 17 g packet Commonly known as: MIRALAX / GLYCOLAX Take 17 g by mouth 2 (two) times daily.   potassium chloride SA 20 MEQ tablet Commonly known as: KLOR-CON M TAKE 1 TABLET EVERY DAY   rOPINIRole 0.25 MG tablet Commonly known as: REQUIP Take 1 tablet (0.25 mg total) by mouth daily as needed (restless legs around 10am - home dose). What changed:  when to take this reasons to take this additional instructions        Allergies  Allergen Reactions   Amlodipine Besylate     Other reaction(s): high dose make feet swell   Codeine Nausea And Vomiting   Meperidine Nausea Only   Metoprolol Succinate [Metoprolol]     Other reaction(s): effects her breathing   Oxycodone Nausea And Vomiting    Consultations: Orthopedic surgery   Procedures/Studies: DG FEMUR PORT, MIN 2 VIEWS RIGHT Result Date: 04/23/2023 CLINICAL DATA:  Postop, fracture EXAM: RIGHT FEMUR PORTABLE 2 VIEW COMPARISON:  Preoperative imaging FINDINGS: Femoral intramedullary nail with trans trochanteric and distal locking screw fixation traverse proximal femur fracture. Improved fracture alignment from preoperative imaging. Persistent displacement of the lesser trochanteric fracture fragment. Recent postsurgical change includes air and edema in the soft tissues. IMPRESSION: ORIF of proximal femur fracture with improved alignment. Electronically Signed   By: Narda Rutherford M.D.   On: 04/23/2023 15:16   DG FEMUR, MIN 2 VIEWS RIGHT Result Date: 04/23/2023 CLINICAL DATA:  Elective surgery. EXAM: RIGHT FEMUR 2 VIEWS COMPARISON:  Preoperative imaging. FINDINGS: Six fluoroscopic spot views of the right femur obtained in the operating  room. Sequential images during femoral intramedullary nail with distal locking and trans trochanteric screw fixation of proximal femur fracture. Fluoroscopy time 1 minutes 28 seconds. Dose 13.1 mGy. IMPRESSION: Intraoperative fluoroscopy during proximal femur fracture fixation. Electronically Signed   By: Narda Rutherford M.D.   On: 04/23/2023 15:15   DG C-Arm 1-60 Min-No Report Result Date: 04/23/2023 Fluoroscopy was utilized by the requesting physician.  No radiographic interpretation.   DG C-Arm 1-60 Min-No Report Result Date: 04/23/2023 Fluoroscopy was utilized by the requesting physician.  No radiographic interpretation.   CT Head Wo Contrast Result Date: 04/22/2023 CLINICAL DATA:  Altered mental status. EXAM: CT HEAD WITHOUT CONTRAST CT CERVICAL SPINE WITHOUT CONTRAST TECHNIQUE: Multidetector CT imaging of the head and cervical spine was performed following the standard protocol without intravenous contrast. Multiplanar CT image reconstructions of the cervical spine were also generated. RADIATION DOSE REDUCTION: This exam was performed according to the departmental dose-optimization program which includes automated exposure control, adjustment of the mA and/or kV according to patient size and/or use of iterative reconstruction technique. COMPARISON:  None Available. FINDINGS: CT HEAD FINDINGS Brain: There is periventricular white matter decreased attenuation consistent with small vessel ischemic changes. Ventricles, sulci and cisterns are prominent consistent with age related involutional changes. No acute intracranial hemorrhage, mass effect or shift. No hydrocephalus. Vascular: No  hyperdense vessel or unexpected calcification. Skull: Normal. Negative for fracture or focal lesion. Sinuses/Orbits: No acute finding. CT CERVICAL SPINE FINDINGS Alignment: Normal. Skull base and vertebrae: No acute fracture. No primary bone lesion or focal pathologic process. Soft tissues and spinal canal: No  prevertebral fluid or swelling. No visible canal hematoma. Disc levels: Extensive degenerative disc disease identified at each cervical level. Especially large marginal osteophyte formation noted at C5-6. There is osteoarthritis at C1-C2. Upper chest: Pleural/parenchymal opacities consistent with scarring in the apices. IMPRESSION: 1. Atrophy and chronic small vessel ischemic changes. No acute intracranial process identified. 2. Degenerative changes of the cervical spine. No acute osseous abnormalities. Electronically Signed   By: Layla Maw M.D.   On: 04/22/2023 18:43   CT Cervical Spine Wo Contrast Result Date: 04/22/2023 CLINICAL DATA:  Altered mental status. EXAM: CT HEAD WITHOUT CONTRAST CT CERVICAL SPINE WITHOUT CONTRAST TECHNIQUE: Multidetector CT imaging of the head and cervical spine was performed following the standard protocol without intravenous contrast. Multiplanar CT image reconstructions of the cervical spine were also generated. RADIATION DOSE REDUCTION: This exam was performed according to the departmental dose-optimization program which includes automated exposure control, adjustment of the mA and/or kV according to patient size and/or use of iterative reconstruction technique. COMPARISON:  None Available. FINDINGS: CT HEAD FINDINGS Brain: There is periventricular white matter decreased attenuation consistent with small vessel ischemic changes. Ventricles, sulci and cisterns are prominent consistent with age related involutional changes. No acute intracranial hemorrhage, mass effect or shift. No hydrocephalus. Vascular: No hyperdense vessel or unexpected calcification. Skull: Normal. Negative for fracture or focal lesion. Sinuses/Orbits: No acute finding. CT CERVICAL SPINE FINDINGS Alignment: Normal. Skull base and vertebrae: No acute fracture. No primary bone lesion or focal pathologic process. Soft tissues and spinal canal: No prevertebral fluid or swelling. No visible canal hematoma.  Disc levels: Extensive degenerative disc disease identified at each cervical level. Especially large marginal osteophyte formation noted at C5-6. There is osteoarthritis at C1-C2. Upper chest: Pleural/parenchymal opacities consistent with scarring in the apices. IMPRESSION: 1. Atrophy and chronic small vessel ischemic changes. No acute intracranial process identified. 2. Degenerative changes of the cervical spine. No acute osseous abnormalities. Electronically Signed   By: Layla Maw M.D.   On: 04/22/2023 18:43   DG Chest Port 1 View Result Date: 04/22/2023 CLINICAL DATA:  Shortness of breath EXAM: PORTABLE CHEST 1 VIEW COMPARISON:  07/15/2022 FINDINGS: Cardiomegaly. Differential densities along the left cardiac silhouette are new since 07/15/2022. Aortic atherosclerotic calcification. Diffuse bilateral interstitial opacities. Pulmonary vascular congestion. No pleural effusion or pneumothorax. Sutures project over the right lower lung. Thoracolumbar fusion hardware. IMPRESSION: 1. Cardiomegaly with pulmonary edema. 2. Differential densities along the left cardiac silhouette are new since 07/15/2022. This may be related to patient rotation however pericardial effusion is difficult to exclude. Consider CT for further evaluation. Electronically Signed   By: Minerva Fester M.D.   On: 04/22/2023 17:05   DG Pelvis 1-2 Views Result Date: 04/22/2023 CLINICAL DATA:  [id number] Fall 190176 EXAM: PELVIS - 1-2 VIEW COMPARISON:  None Available. FINDINGS: Comminuted intertrochanteric proximal right femur fracture with mild impaction, apex lateral angulation and mild retraction of the lesser trochanter fracture fragment. No evidence of hip dislocation on these frontal views. No focal osseous lesions. IMPRESSION: Comminuted intertrochanteric proximal right femur fracture. Electronically Signed   By: Delbert Phenix M.D.   On: 04/22/2023 16:29   DG Femur Min 2 Views Right Result Date: 04/22/2023 CLINICAL DATA:  Fall  EXAM: RIGHT FEMUR  2 VIEWS COMPARISON:  None Available. FINDINGS: Comminuted intertrochanteric proximal right femur fracture with apex lateral angulation and mild impaction. No focal osseous lesions. No evidence of dislocation at the right hip or right knee on these limited views. IMPRESSION: Comminuted intertrochanteric proximal right femur fracture. Electronically Signed   By: Delbert Phenix M.D.   On: 04/22/2023 16:28     Subjective: No new complaints  Discharge Exam: Vitals:   04/26/23 0442 04/26/23 0740  BP: 128/65 (!) 124/56  Pulse: 86 69  Resp: 18 17  Temp: 98 F (36.7 C) 97.8 F (36.6 C)  SpO2: 94%    Vitals:   04/25/23 1337 04/25/23 1954 04/26/23 0442 04/26/23 0740  BP: (!) 104/55 107/67 128/65 (!) 124/56  Pulse: (!) 55 (!) 47 86 69  Resp: 16 17 18 17   Temp: 98.4 F (36.9 C) 98.3 F (36.8 C) 98 F (36.7 C) 97.8 F (36.6 C)  TempSrc: Oral Oral Oral   SpO2: 96% 92% 94%   Weight:      Height:        General: Pt is alert, awake, not in acute distress Cardiovascular: RRR, S1/S2 +, no rubs, no gallops Respiratory: CTA bilaterally, no wheezing, no rhonchi Abdominal: Soft, NT, ND, bowel sounds + Extremities: no edema, no cyanosis    The results of significant diagnostics from this hospitalization (including imaging, microbiology, ancillary and laboratory) are listed below for reference.     Microbiology: Recent Results (from the past 240 hours)  Surgical PCR screen     Status: None   Collection Time: 04/22/23 11:04 PM   Specimen: Nasal Mucosa; Nasal Swab  Result Value Ref Range Status   MRSA, PCR NEGATIVE NEGATIVE Final   Staphylococcus aureus NEGATIVE NEGATIVE Final    Comment: (NOTE) The Xpert SA Assay (FDA approved for NASAL specimens in patients 90 years of age and older), is one component of a comprehensive surveillance program. It is not intended to diagnose infection nor to guide or monitor treatment. Performed at [facility], [address] [place], [place] [address]      Labs: BNP (last 3 results) Recent Labs    07/13/22 1845  BNP 1,998.9*   Basic Metabolic Panel: Recent Labs  Lab 04/22/23 1555 04/22/23 1639 04/23/23 0841 04/24/23 0600 04/25/23 0641  NA 136 138 139 137 139  K 4.0 4.5 4.7 5.0 4.6  CL 107  --  103 107 110  CO2 25  --  27 23 25   GLUCOSE 150*  --  88 126* 97  BUN 35*  --  39* 45* 41*  CREATININE 1.46*  --  1.59* 1.45* 1.08*  CALCIUM 7.9*  --  8.9 8.0* 7.9*   Liver Function Tests: No results for input(s): "AST", "ALT", "ALKPHOS", "BILITOT", "PROT", "ALBUMIN" in the last 168 hours. No results for input(s): "LIPASE", "AMYLASE" in the last 168 hours. No results for input(s): "AMMONIA" in the last 168 hours. CBC: Recent Labs  Lab 04/22/23 1555 04/22/23 1639 04/24/23 0600 04/25/23 0641  WBC 10.4  --  13.7* 10.7*  NEUTROABS 8.5*  --   --   --   HGB 12.6 13.6 10.7* 9.8*  HCT 42.3 40.0 34.7* 31.5*  MCV 90.4  --  89.2 87.7  PLT 238  --  171 147*   Cardiac Enzymes: No results for input(s): "CKTOTAL", "CKMB", "CKMBINDEX", "TROPONINI" in the last 168 hours. BNP: Invalid input(s): "POCBNP" CBG: No results for input(s): "GLUCAP" in the last 168 hours. D-Dimer No results  for input(s): "DDIMER" in the last 72 hours. Hgb A1c No results for input(s): "HGBA1C" in the last 72 hours. Lipid Profile No results for input(s): "CHOL", "HDL", "LDLCALC", "TRIG", "CHOLHDL", "LDLDIRECT" in the last 72 hours. Thyroid function studies No results for input(s): "TSH", "T4TOTAL", "T3FREE", "THYROIDAB" in the last 72 hours.  Invalid input(s): "FREET3" Anemia work up Recent Labs    04/24/23 1018  VITAMINB12 349  FOLATE 5.9*  FERRITIN 167  TIBC 235*  IRON 24*  RETICCTPCT 2.0   Urinalysis    Component Value Date/Time   COLORURINE AMBER (A) 05/02/2020 1442   APPEARANCEUR TURBID (A) 05/02/2020 1442   LABSPEC 1.023 05/02/2020 1442   PHURINE 5.0 05/02/2020 1442   GLUCOSEU NEGATIVE 05/02/2020 1442    HGBUR LARGE (A) 05/02/2020 1442   BILIRUBINUR NEGATIVE 05/02/2020 1442   KETONESUR NEGATIVE 05/02/2020 1442   PROTEINUR 100 (A) 05/02/2020 1442   NITRITE NEGATIVE 05/02/2020 1442   LEUKOCYTESUR MODERATE (A) 05/02/2020 1442   Sepsis Labs Recent Labs  Lab 04/22/23 1555 04/24/23 0600 04/25/23 0641  WBC 10.4 13.7* 10.7*   Microbiology Recent Results (from the past 240 hours)  Surgical PCR screen     Status: None   Collection Time: 04/22/23 11:04 PM   Specimen: Nasal Mucosa; Nasal Swab  Result Value Ref Range Status   MRSA, PCR NEGATIVE NEGATIVE Final   Staphylococcus aureus NEGATIVE NEGATIVE Final    Comment: (NOTE) The Xpert SA Assay (FDA approved for NASAL specimens in patients 41 years of age and older), is one component of a comprehensive surveillance program. It is not intended to diagnose infection nor to guide or monitor treatment. Performed at [facility], [address], [place], [place] [address]      Time coordinating discharge: Over 35 minutes  SIGNED:   Marinda Elk, MD  [facility] 04/26/2023, 9:59 AM Pager   If 7PM-7AM, please contact night-coverage [email or web address] Password [email or web address]

## 2023-04-26 NOTE — Progress Notes (Signed)
Report given to [facility] LPN at [place].Pt is in stable condition. Daughter at bedside.Pt is no awaiting PTAR.

## 2023-04-30 DIAGNOSIS — I5040 Unspecified combined systolic (congestive) and diastolic (congestive) heart failure: Secondary | ICD-10-CM | POA: Diagnosis not present

## 2023-04-30 DIAGNOSIS — F32A Depression, unspecified: Secondary | ICD-10-CM | POA: Diagnosis not present

## 2023-04-30 DIAGNOSIS — F1120 Opioid dependence, uncomplicated: Secondary | ICD-10-CM | POA: Diagnosis not present

## 2023-04-30 DIAGNOSIS — R54 Age-related physical debility: Secondary | ICD-10-CM | POA: Diagnosis not present

## 2023-04-30 DIAGNOSIS — S72141D Displaced intertrochanteric fracture of right femur, subsequent encounter for closed fracture with routine healing: Secondary | ICD-10-CM | POA: Diagnosis not present

## 2023-04-30 DIAGNOSIS — Z7901 Long term (current) use of anticoagulants: Secondary | ICD-10-CM | POA: Diagnosis not present

## 2023-04-30 DIAGNOSIS — G2581 Restless legs syndrome: Secondary | ICD-10-CM | POA: Diagnosis not present

## 2023-04-30 DIAGNOSIS — R262 Difficulty in walking, not elsewhere classified: Secondary | ICD-10-CM | POA: Diagnosis not present

## 2023-04-30 DIAGNOSIS — I110 Hypertensive heart disease with heart failure: Secondary | ICD-10-CM | POA: Diagnosis not present

## 2023-04-30 DIAGNOSIS — D649 Anemia, unspecified: Secondary | ICD-10-CM | POA: Diagnosis not present

## 2023-04-30 DIAGNOSIS — I4891 Unspecified atrial fibrillation: Secondary | ICD-10-CM | POA: Diagnosis not present

## 2023-05-01 DIAGNOSIS — G629 Polyneuropathy, unspecified: Secondary | ICD-10-CM | POA: Diagnosis not present

## 2023-05-01 DIAGNOSIS — D649 Anemia, unspecified: Secondary | ICD-10-CM | POA: Diagnosis not present

## 2023-05-01 DIAGNOSIS — N179 Acute kidney failure, unspecified: Secondary | ICD-10-CM | POA: Diagnosis not present

## 2023-05-04 DIAGNOSIS — S72141D Displaced intertrochanteric fracture of right femur, subsequent encounter for closed fracture with routine healing: Secondary | ICD-10-CM | POA: Diagnosis not present

## 2023-05-04 DIAGNOSIS — G629 Polyneuropathy, unspecified: Secondary | ICD-10-CM | POA: Diagnosis not present

## 2023-05-07 DIAGNOSIS — S72141D Displaced intertrochanteric fracture of right femur, subsequent encounter for closed fracture with routine healing: Secondary | ICD-10-CM | POA: Diagnosis not present

## 2023-05-09 DIAGNOSIS — W19XXXD Unspecified fall, subsequent encounter: Secondary | ICD-10-CM | POA: Diagnosis not present

## 2023-05-09 DIAGNOSIS — J9601 Acute respiratory failure with hypoxia: Secondary | ICD-10-CM | POA: Diagnosis not present

## 2023-05-09 DIAGNOSIS — R2681 Unsteadiness on feet: Secondary | ICD-10-CM | POA: Diagnosis not present

## 2023-05-09 DIAGNOSIS — R2689 Other abnormalities of gait and mobility: Secondary | ICD-10-CM | POA: Diagnosis not present

## 2023-05-09 DIAGNOSIS — M6281 Muscle weakness (generalized): Secondary | ICD-10-CM | POA: Diagnosis not present

## 2023-05-09 DIAGNOSIS — S72141D Displaced intertrochanteric fracture of right femur, subsequent encounter for closed fracture with routine healing: Secondary | ICD-10-CM | POA: Diagnosis not present

## 2023-05-09 DIAGNOSIS — I10 Essential (primary) hypertension: Secondary | ICD-10-CM | POA: Diagnosis not present

## 2023-05-09 DIAGNOSIS — Z4789 Encounter for other orthopedic aftercare: Secondary | ICD-10-CM | POA: Diagnosis not present

## 2023-05-09 DIAGNOSIS — M25551 Pain in right hip: Secondary | ICD-10-CM | POA: Diagnosis not present

## 2023-05-13 DIAGNOSIS — S72141D Displaced intertrochanteric fracture of right femur, subsequent encounter for closed fracture with routine healing: Secondary | ICD-10-CM | POA: Diagnosis not present

## 2023-05-13 DIAGNOSIS — I482 Chronic atrial fibrillation, unspecified: Secondary | ICD-10-CM | POA: Diagnosis not present

## 2023-05-13 DIAGNOSIS — F321 Major depressive disorder, single episode, moderate: Secondary | ICD-10-CM | POA: Diagnosis not present

## 2023-05-13 DIAGNOSIS — S22000D Wedge compression fracture of unspecified thoracic vertebra, subsequent encounter for fracture with routine healing: Secondary | ICD-10-CM | POA: Diagnosis not present

## 2023-05-13 DIAGNOSIS — I5040 Unspecified combined systolic (congestive) and diastolic (congestive) heart failure: Secondary | ICD-10-CM | POA: Diagnosis not present

## 2023-05-13 DIAGNOSIS — I110 Hypertensive heart disease with heart failure: Secondary | ICD-10-CM | POA: Diagnosis not present

## 2023-05-13 DIAGNOSIS — G2581 Restless legs syndrome: Secondary | ICD-10-CM | POA: Diagnosis not present

## 2023-05-13 DIAGNOSIS — N179 Acute kidney failure, unspecified: Secondary | ICD-10-CM | POA: Diagnosis not present

## 2023-05-13 DIAGNOSIS — D649 Anemia, unspecified: Secondary | ICD-10-CM | POA: Diagnosis not present

## 2023-05-15 DIAGNOSIS — I482 Chronic atrial fibrillation, unspecified: Secondary | ICD-10-CM | POA: Diagnosis not present

## 2023-05-15 DIAGNOSIS — I5040 Unspecified combined systolic (congestive) and diastolic (congestive) heart failure: Secondary | ICD-10-CM | POA: Diagnosis not present

## 2023-05-15 DIAGNOSIS — I110 Hypertensive heart disease with heart failure: Secondary | ICD-10-CM | POA: Diagnosis not present

## 2023-05-15 DIAGNOSIS — G2581 Restless legs syndrome: Secondary | ICD-10-CM | POA: Diagnosis not present

## 2023-05-15 DIAGNOSIS — F321 Major depressive disorder, single episode, moderate: Secondary | ICD-10-CM | POA: Diagnosis not present

## 2023-05-15 DIAGNOSIS — N179 Acute kidney failure, unspecified: Secondary | ICD-10-CM | POA: Diagnosis not present

## 2023-05-15 DIAGNOSIS — S72141D Displaced intertrochanteric fracture of right femur, subsequent encounter for closed fracture with routine healing: Secondary | ICD-10-CM | POA: Diagnosis not present

## 2023-05-15 DIAGNOSIS — D649 Anemia, unspecified: Secondary | ICD-10-CM | POA: Diagnosis not present

## 2023-05-15 DIAGNOSIS — S22000D Wedge compression fracture of unspecified thoracic vertebra, subsequent encounter for fracture with routine healing: Secondary | ICD-10-CM | POA: Diagnosis not present

## 2023-05-17 DIAGNOSIS — I4821 Permanent atrial fibrillation: Secondary | ICD-10-CM | POA: Diagnosis not present

## 2023-05-17 DIAGNOSIS — R52 Pain, unspecified: Secondary | ICD-10-CM | POA: Diagnosis not present

## 2023-05-17 DIAGNOSIS — I872 Venous insufficiency (chronic) (peripheral): Secondary | ICD-10-CM | POA: Diagnosis not present

## 2023-05-17 DIAGNOSIS — I10 Essential (primary) hypertension: Secondary | ICD-10-CM | POA: Diagnosis not present

## 2023-05-17 DIAGNOSIS — I50812 Chronic right heart failure: Secondary | ICD-10-CM | POA: Diagnosis not present

## 2023-05-17 DIAGNOSIS — Z7409 Other reduced mobility: Secondary | ICD-10-CM | POA: Diagnosis not present

## 2023-05-17 DIAGNOSIS — G2581 Restless legs syndrome: Secondary | ICD-10-CM | POA: Diagnosis not present

## 2023-05-17 DIAGNOSIS — R600 Localized edema: Secondary | ICD-10-CM | POA: Diagnosis not present

## 2023-05-22 DIAGNOSIS — I5040 Unspecified combined systolic (congestive) and diastolic (congestive) heart failure: Secondary | ICD-10-CM | POA: Diagnosis not present

## 2023-05-22 DIAGNOSIS — N179 Acute kidney failure, unspecified: Secondary | ICD-10-CM | POA: Diagnosis not present

## 2023-05-22 DIAGNOSIS — S22000D Wedge compression fracture of unspecified thoracic vertebra, subsequent encounter for fracture with routine healing: Secondary | ICD-10-CM | POA: Diagnosis not present

## 2023-05-22 DIAGNOSIS — S72141D Displaced intertrochanteric fracture of right femur, subsequent encounter for closed fracture with routine healing: Secondary | ICD-10-CM | POA: Diagnosis not present

## 2023-05-22 DIAGNOSIS — G2581 Restless legs syndrome: Secondary | ICD-10-CM | POA: Diagnosis not present

## 2023-05-22 DIAGNOSIS — F321 Major depressive disorder, single episode, moderate: Secondary | ICD-10-CM | POA: Diagnosis not present

## 2023-05-22 DIAGNOSIS — I482 Chronic atrial fibrillation, unspecified: Secondary | ICD-10-CM | POA: Diagnosis not present

## 2023-05-22 DIAGNOSIS — I110 Hypertensive heart disease with heart failure: Secondary | ICD-10-CM | POA: Diagnosis not present

## 2023-05-22 DIAGNOSIS — D649 Anemia, unspecified: Secondary | ICD-10-CM | POA: Diagnosis not present

## 2023-05-23 DIAGNOSIS — S72141D Displaced intertrochanteric fracture of right femur, subsequent encounter for closed fracture with routine healing: Secondary | ICD-10-CM | POA: Diagnosis not present

## 2023-05-23 DIAGNOSIS — D649 Anemia, unspecified: Secondary | ICD-10-CM | POA: Diagnosis not present

## 2023-05-23 DIAGNOSIS — I110 Hypertensive heart disease with heart failure: Secondary | ICD-10-CM | POA: Diagnosis not present

## 2023-05-23 DIAGNOSIS — I482 Chronic atrial fibrillation, unspecified: Secondary | ICD-10-CM | POA: Diagnosis not present

## 2023-05-23 DIAGNOSIS — S22000D Wedge compression fracture of unspecified thoracic vertebra, subsequent encounter for fracture with routine healing: Secondary | ICD-10-CM | POA: Diagnosis not present

## 2023-05-23 DIAGNOSIS — F321 Major depressive disorder, single episode, moderate: Secondary | ICD-10-CM | POA: Diagnosis not present

## 2023-05-23 DIAGNOSIS — I5040 Unspecified combined systolic (congestive) and diastolic (congestive) heart failure: Secondary | ICD-10-CM | POA: Diagnosis not present

## 2023-05-23 DIAGNOSIS — G2581 Restless legs syndrome: Secondary | ICD-10-CM | POA: Diagnosis not present

## 2023-05-23 DIAGNOSIS — N179 Acute kidney failure, unspecified: Secondary | ICD-10-CM | POA: Diagnosis not present

## 2023-05-25 DIAGNOSIS — I482 Chronic atrial fibrillation, unspecified: Secondary | ICD-10-CM | POA: Diagnosis not present

## 2023-05-25 DIAGNOSIS — N179 Acute kidney failure, unspecified: Secondary | ICD-10-CM | POA: Diagnosis not present

## 2023-05-25 DIAGNOSIS — S72141D Displaced intertrochanteric fracture of right femur, subsequent encounter for closed fracture with routine healing: Secondary | ICD-10-CM | POA: Diagnosis not present

## 2023-05-25 DIAGNOSIS — D649 Anemia, unspecified: Secondary | ICD-10-CM | POA: Diagnosis not present

## 2023-05-25 DIAGNOSIS — F321 Major depressive disorder, single episode, moderate: Secondary | ICD-10-CM | POA: Diagnosis not present

## 2023-05-25 DIAGNOSIS — G2581 Restless legs syndrome: Secondary | ICD-10-CM | POA: Diagnosis not present

## 2023-05-25 DIAGNOSIS — S22000D Wedge compression fracture of unspecified thoracic vertebra, subsequent encounter for fracture with routine healing: Secondary | ICD-10-CM | POA: Diagnosis not present

## 2023-05-25 DIAGNOSIS — I5040 Unspecified combined systolic (congestive) and diastolic (congestive) heart failure: Secondary | ICD-10-CM | POA: Diagnosis not present

## 2023-05-25 DIAGNOSIS — I110 Hypertensive heart disease with heart failure: Secondary | ICD-10-CM | POA: Diagnosis not present

## 2023-05-27 DIAGNOSIS — I5040 Unspecified combined systolic (congestive) and diastolic (congestive) heart failure: Secondary | ICD-10-CM | POA: Diagnosis not present

## 2023-05-27 DIAGNOSIS — N179 Acute kidney failure, unspecified: Secondary | ICD-10-CM | POA: Diagnosis not present

## 2023-05-27 DIAGNOSIS — I110 Hypertensive heart disease with heart failure: Secondary | ICD-10-CM | POA: Diagnosis not present

## 2023-05-27 DIAGNOSIS — I482 Chronic atrial fibrillation, unspecified: Secondary | ICD-10-CM | POA: Diagnosis not present

## 2023-05-27 DIAGNOSIS — S72141D Displaced intertrochanteric fracture of right femur, subsequent encounter for closed fracture with routine healing: Secondary | ICD-10-CM | POA: Diagnosis not present

## 2023-05-27 DIAGNOSIS — F321 Major depressive disorder, single episode, moderate: Secondary | ICD-10-CM | POA: Diagnosis not present

## 2023-05-27 DIAGNOSIS — D649 Anemia, unspecified: Secondary | ICD-10-CM | POA: Diagnosis not present

## 2023-05-27 DIAGNOSIS — S22000D Wedge compression fracture of unspecified thoracic vertebra, subsequent encounter for fracture with routine healing: Secondary | ICD-10-CM | POA: Diagnosis not present

## 2023-05-27 DIAGNOSIS — G2581 Restless legs syndrome: Secondary | ICD-10-CM | POA: Diagnosis not present

## 2023-05-29 DIAGNOSIS — S72141D Displaced intertrochanteric fracture of right femur, subsequent encounter for closed fracture with routine healing: Secondary | ICD-10-CM | POA: Diagnosis not present

## 2023-05-29 DIAGNOSIS — F321 Major depressive disorder, single episode, moderate: Secondary | ICD-10-CM | POA: Diagnosis not present

## 2023-05-29 DIAGNOSIS — I5040 Unspecified combined systolic (congestive) and diastolic (congestive) heart failure: Secondary | ICD-10-CM | POA: Diagnosis not present

## 2023-05-29 DIAGNOSIS — S22000D Wedge compression fracture of unspecified thoracic vertebra, subsequent encounter for fracture with routine healing: Secondary | ICD-10-CM | POA: Diagnosis not present

## 2023-05-29 DIAGNOSIS — D649 Anemia, unspecified: Secondary | ICD-10-CM | POA: Diagnosis not present

## 2023-05-29 DIAGNOSIS — I482 Chronic atrial fibrillation, unspecified: Secondary | ICD-10-CM | POA: Diagnosis not present

## 2023-05-29 DIAGNOSIS — I110 Hypertensive heart disease with heart failure: Secondary | ICD-10-CM | POA: Diagnosis not present

## 2023-05-29 DIAGNOSIS — N179 Acute kidney failure, unspecified: Secondary | ICD-10-CM | POA: Diagnosis not present

## 2023-05-29 DIAGNOSIS — G2581 Restless legs syndrome: Secondary | ICD-10-CM | POA: Diagnosis not present

## 2023-05-31 DIAGNOSIS — I5040 Unspecified combined systolic (congestive) and diastolic (congestive) heart failure: Secondary | ICD-10-CM | POA: Diagnosis not present

## 2023-05-31 DIAGNOSIS — I482 Chronic atrial fibrillation, unspecified: Secondary | ICD-10-CM | POA: Diagnosis not present

## 2023-05-31 DIAGNOSIS — F321 Major depressive disorder, single episode, moderate: Secondary | ICD-10-CM | POA: Diagnosis not present

## 2023-05-31 DIAGNOSIS — S22000D Wedge compression fracture of unspecified thoracic vertebra, subsequent encounter for fracture with routine healing: Secondary | ICD-10-CM | POA: Diagnosis not present

## 2023-05-31 DIAGNOSIS — S72141D Displaced intertrochanteric fracture of right femur, subsequent encounter for closed fracture with routine healing: Secondary | ICD-10-CM | POA: Diagnosis not present

## 2023-05-31 DIAGNOSIS — G2581 Restless legs syndrome: Secondary | ICD-10-CM | POA: Diagnosis not present

## 2023-05-31 DIAGNOSIS — D649 Anemia, unspecified: Secondary | ICD-10-CM | POA: Diagnosis not present

## 2023-05-31 DIAGNOSIS — I110 Hypertensive heart disease with heart failure: Secondary | ICD-10-CM | POA: Diagnosis not present

## 2023-05-31 DIAGNOSIS — N179 Acute kidney failure, unspecified: Secondary | ICD-10-CM | POA: Diagnosis not present

## 2023-06-01 DIAGNOSIS — S22000D Wedge compression fracture of unspecified thoracic vertebra, subsequent encounter for fracture with routine healing: Secondary | ICD-10-CM | POA: Diagnosis not present

## 2023-06-01 DIAGNOSIS — N179 Acute kidney failure, unspecified: Secondary | ICD-10-CM | POA: Diagnosis not present

## 2023-06-01 DIAGNOSIS — I482 Chronic atrial fibrillation, unspecified: Secondary | ICD-10-CM | POA: Diagnosis not present

## 2023-06-01 DIAGNOSIS — G2581 Restless legs syndrome: Secondary | ICD-10-CM | POA: Diagnosis not present

## 2023-06-01 DIAGNOSIS — I5040 Unspecified combined systolic (congestive) and diastolic (congestive) heart failure: Secondary | ICD-10-CM | POA: Diagnosis not present

## 2023-06-01 DIAGNOSIS — D649 Anemia, unspecified: Secondary | ICD-10-CM | POA: Diagnosis not present

## 2023-06-01 DIAGNOSIS — I110 Hypertensive heart disease with heart failure: Secondary | ICD-10-CM | POA: Diagnosis not present

## 2023-06-01 DIAGNOSIS — L97211 Non-pressure chronic ulcer of right calf limited to breakdown of skin: Secondary | ICD-10-CM | POA: Diagnosis not present

## 2023-06-01 DIAGNOSIS — S72141D Displaced intertrochanteric fracture of right femur, subsequent encounter for closed fracture with routine healing: Secondary | ICD-10-CM | POA: Diagnosis not present

## 2023-06-01 DIAGNOSIS — F321 Major depressive disorder, single episode, moderate: Secondary | ICD-10-CM | POA: Diagnosis not present

## 2023-06-04 DIAGNOSIS — I110 Hypertensive heart disease with heart failure: Secondary | ICD-10-CM | POA: Diagnosis not present

## 2023-06-04 DIAGNOSIS — S72141D Displaced intertrochanteric fracture of right femur, subsequent encounter for closed fracture with routine healing: Secondary | ICD-10-CM | POA: Diagnosis not present

## 2023-06-04 DIAGNOSIS — G2581 Restless legs syndrome: Secondary | ICD-10-CM | POA: Diagnosis not present

## 2023-06-04 DIAGNOSIS — N179 Acute kidney failure, unspecified: Secondary | ICD-10-CM | POA: Diagnosis not present

## 2023-06-04 DIAGNOSIS — I482 Chronic atrial fibrillation, unspecified: Secondary | ICD-10-CM | POA: Diagnosis not present

## 2023-06-04 DIAGNOSIS — D649 Anemia, unspecified: Secondary | ICD-10-CM | POA: Diagnosis not present

## 2023-06-04 DIAGNOSIS — I5040 Unspecified combined systolic (congestive) and diastolic (congestive) heart failure: Secondary | ICD-10-CM | POA: Diagnosis not present

## 2023-06-04 DIAGNOSIS — F321 Major depressive disorder, single episode, moderate: Secondary | ICD-10-CM | POA: Diagnosis not present

## 2023-06-04 DIAGNOSIS — S22000D Wedge compression fracture of unspecified thoracic vertebra, subsequent encounter for fracture with routine healing: Secondary | ICD-10-CM | POA: Diagnosis not present

## 2023-06-05 DIAGNOSIS — S72141D Displaced intertrochanteric fracture of right femur, subsequent encounter for closed fracture with routine healing: Secondary | ICD-10-CM | POA: Diagnosis not present

## 2023-06-05 DIAGNOSIS — S22000D Wedge compression fracture of unspecified thoracic vertebra, subsequent encounter for fracture with routine healing: Secondary | ICD-10-CM | POA: Diagnosis not present

## 2023-06-05 DIAGNOSIS — I5040 Unspecified combined systolic (congestive) and diastolic (congestive) heart failure: Secondary | ICD-10-CM | POA: Diagnosis not present

## 2023-06-05 DIAGNOSIS — G2581 Restless legs syndrome: Secondary | ICD-10-CM | POA: Diagnosis not present

## 2023-06-05 DIAGNOSIS — N179 Acute kidney failure, unspecified: Secondary | ICD-10-CM | POA: Diagnosis not present

## 2023-06-05 DIAGNOSIS — D649 Anemia, unspecified: Secondary | ICD-10-CM | POA: Diagnosis not present

## 2023-06-05 DIAGNOSIS — F321 Major depressive disorder, single episode, moderate: Secondary | ICD-10-CM | POA: Diagnosis not present

## 2023-06-05 DIAGNOSIS — I110 Hypertensive heart disease with heart failure: Secondary | ICD-10-CM | POA: Diagnosis not present

## 2023-06-05 DIAGNOSIS — I482 Chronic atrial fibrillation, unspecified: Secondary | ICD-10-CM | POA: Diagnosis not present

## 2023-06-06 DIAGNOSIS — L97211 Non-pressure chronic ulcer of right calf limited to breakdown of skin: Secondary | ICD-10-CM | POA: Diagnosis not present

## 2023-06-07 DIAGNOSIS — S72141D Displaced intertrochanteric fracture of right femur, subsequent encounter for closed fracture with routine healing: Secondary | ICD-10-CM | POA: Diagnosis not present

## 2023-06-07 DIAGNOSIS — S22000D Wedge compression fracture of unspecified thoracic vertebra, subsequent encounter for fracture with routine healing: Secondary | ICD-10-CM | POA: Diagnosis not present

## 2023-06-07 DIAGNOSIS — G2581 Restless legs syndrome: Secondary | ICD-10-CM | POA: Diagnosis not present

## 2023-06-07 DIAGNOSIS — D649 Anemia, unspecified: Secondary | ICD-10-CM | POA: Diagnosis not present

## 2023-06-07 DIAGNOSIS — F321 Major depressive disorder, single episode, moderate: Secondary | ICD-10-CM | POA: Diagnosis not present

## 2023-06-07 DIAGNOSIS — N179 Acute kidney failure, unspecified: Secondary | ICD-10-CM | POA: Diagnosis not present

## 2023-06-07 DIAGNOSIS — I482 Chronic atrial fibrillation, unspecified: Secondary | ICD-10-CM | POA: Diagnosis not present

## 2023-06-07 DIAGNOSIS — I5040 Unspecified combined systolic (congestive) and diastolic (congestive) heart failure: Secondary | ICD-10-CM | POA: Diagnosis not present

## 2023-06-07 DIAGNOSIS — I110 Hypertensive heart disease with heart failure: Secondary | ICD-10-CM | POA: Diagnosis not present

## 2023-06-08 DIAGNOSIS — I482 Chronic atrial fibrillation, unspecified: Secondary | ICD-10-CM | POA: Diagnosis not present

## 2023-06-08 DIAGNOSIS — I110 Hypertensive heart disease with heart failure: Secondary | ICD-10-CM | POA: Diagnosis not present

## 2023-06-08 DIAGNOSIS — S72141D Displaced intertrochanteric fracture of right femur, subsequent encounter for closed fracture with routine healing: Secondary | ICD-10-CM | POA: Diagnosis not present

## 2023-06-08 DIAGNOSIS — F321 Major depressive disorder, single episode, moderate: Secondary | ICD-10-CM | POA: Diagnosis not present

## 2023-06-08 DIAGNOSIS — S22000D Wedge compression fracture of unspecified thoracic vertebra, subsequent encounter for fracture with routine healing: Secondary | ICD-10-CM | POA: Diagnosis not present

## 2023-06-08 DIAGNOSIS — I5040 Unspecified combined systolic (congestive) and diastolic (congestive) heart failure: Secondary | ICD-10-CM | POA: Diagnosis not present

## 2023-06-08 DIAGNOSIS — D649 Anemia, unspecified: Secondary | ICD-10-CM | POA: Diagnosis not present

## 2023-06-08 DIAGNOSIS — G2581 Restless legs syndrome: Secondary | ICD-10-CM | POA: Diagnosis not present

## 2023-06-08 DIAGNOSIS — N179 Acute kidney failure, unspecified: Secondary | ICD-10-CM | POA: Diagnosis not present

## 2023-06-11 DIAGNOSIS — I110 Hypertensive heart disease with heart failure: Secondary | ICD-10-CM | POA: Diagnosis not present

## 2023-06-11 DIAGNOSIS — I5040 Unspecified combined systolic (congestive) and diastolic (congestive) heart failure: Secondary | ICD-10-CM | POA: Diagnosis not present

## 2023-06-11 DIAGNOSIS — G2581 Restless legs syndrome: Secondary | ICD-10-CM | POA: Diagnosis not present

## 2023-06-11 DIAGNOSIS — N179 Acute kidney failure, unspecified: Secondary | ICD-10-CM | POA: Diagnosis not present

## 2023-06-11 DIAGNOSIS — S72141D Displaced intertrochanteric fracture of right femur, subsequent encounter for closed fracture with routine healing: Secondary | ICD-10-CM | POA: Diagnosis not present

## 2023-06-11 DIAGNOSIS — I482 Chronic atrial fibrillation, unspecified: Secondary | ICD-10-CM | POA: Diagnosis not present

## 2023-06-11 DIAGNOSIS — F321 Major depressive disorder, single episode, moderate: Secondary | ICD-10-CM | POA: Diagnosis not present

## 2023-06-11 DIAGNOSIS — D649 Anemia, unspecified: Secondary | ICD-10-CM | POA: Diagnosis not present

## 2023-06-11 DIAGNOSIS — S22000D Wedge compression fracture of unspecified thoracic vertebra, subsequent encounter for fracture with routine healing: Secondary | ICD-10-CM | POA: Diagnosis not present

## 2023-06-12 DIAGNOSIS — I5040 Unspecified combined systolic (congestive) and diastolic (congestive) heart failure: Secondary | ICD-10-CM | POA: Diagnosis not present

## 2023-06-12 DIAGNOSIS — N179 Acute kidney failure, unspecified: Secondary | ICD-10-CM | POA: Diagnosis not present

## 2023-06-12 DIAGNOSIS — S22000D Wedge compression fracture of unspecified thoracic vertebra, subsequent encounter for fracture with routine healing: Secondary | ICD-10-CM | POA: Diagnosis not present

## 2023-06-12 DIAGNOSIS — D649 Anemia, unspecified: Secondary | ICD-10-CM | POA: Diagnosis not present

## 2023-06-12 DIAGNOSIS — I110 Hypertensive heart disease with heart failure: Secondary | ICD-10-CM | POA: Diagnosis not present

## 2023-06-12 DIAGNOSIS — S72141D Displaced intertrochanteric fracture of right femur, subsequent encounter for closed fracture with routine healing: Secondary | ICD-10-CM | POA: Diagnosis not present

## 2023-06-12 DIAGNOSIS — G2581 Restless legs syndrome: Secondary | ICD-10-CM | POA: Diagnosis not present

## 2023-06-12 DIAGNOSIS — I482 Chronic atrial fibrillation, unspecified: Secondary | ICD-10-CM | POA: Diagnosis not present

## 2023-06-12 DIAGNOSIS — F321 Major depressive disorder, single episode, moderate: Secondary | ICD-10-CM | POA: Diagnosis not present

## 2023-06-13 DIAGNOSIS — S72141D Displaced intertrochanteric fracture of right femur, subsequent encounter for closed fracture with routine healing: Secondary | ICD-10-CM | POA: Diagnosis not present

## 2023-06-13 DIAGNOSIS — D649 Anemia, unspecified: Secondary | ICD-10-CM | POA: Diagnosis not present

## 2023-06-13 DIAGNOSIS — N179 Acute kidney failure, unspecified: Secondary | ICD-10-CM | POA: Diagnosis not present

## 2023-06-13 DIAGNOSIS — I5040 Unspecified combined systolic (congestive) and diastolic (congestive) heart failure: Secondary | ICD-10-CM | POA: Diagnosis not present

## 2023-06-13 DIAGNOSIS — I110 Hypertensive heart disease with heart failure: Secondary | ICD-10-CM | POA: Diagnosis not present

## 2023-06-13 DIAGNOSIS — I482 Chronic atrial fibrillation, unspecified: Secondary | ICD-10-CM | POA: Diagnosis not present

## 2023-06-13 DIAGNOSIS — S22000D Wedge compression fracture of unspecified thoracic vertebra, subsequent encounter for fracture with routine healing: Secondary | ICD-10-CM | POA: Diagnosis not present

## 2023-06-13 DIAGNOSIS — F321 Major depressive disorder, single episode, moderate: Secondary | ICD-10-CM | POA: Diagnosis not present

## 2023-06-13 DIAGNOSIS — G2581 Restless legs syndrome: Secondary | ICD-10-CM | POA: Diagnosis not present

## 2023-06-15 DIAGNOSIS — I110 Hypertensive heart disease with heart failure: Secondary | ICD-10-CM | POA: Diagnosis not present

## 2023-06-15 DIAGNOSIS — S72141D Displaced intertrochanteric fracture of right femur, subsequent encounter for closed fracture with routine healing: Secondary | ICD-10-CM | POA: Diagnosis not present

## 2023-06-15 DIAGNOSIS — F321 Major depressive disorder, single episode, moderate: Secondary | ICD-10-CM | POA: Diagnosis not present

## 2023-06-15 DIAGNOSIS — S22000D Wedge compression fracture of unspecified thoracic vertebra, subsequent encounter for fracture with routine healing: Secondary | ICD-10-CM | POA: Diagnosis not present

## 2023-06-15 DIAGNOSIS — I5040 Unspecified combined systolic (congestive) and diastolic (congestive) heart failure: Secondary | ICD-10-CM | POA: Diagnosis not present

## 2023-06-15 DIAGNOSIS — G2581 Restless legs syndrome: Secondary | ICD-10-CM | POA: Diagnosis not present

## 2023-06-15 DIAGNOSIS — N179 Acute kidney failure, unspecified: Secondary | ICD-10-CM | POA: Diagnosis not present

## 2023-06-15 DIAGNOSIS — I482 Chronic atrial fibrillation, unspecified: Secondary | ICD-10-CM | POA: Diagnosis not present

## 2023-06-15 DIAGNOSIS — D649 Anemia, unspecified: Secondary | ICD-10-CM | POA: Diagnosis not present

## 2023-06-18 DIAGNOSIS — S22000D Wedge compression fracture of unspecified thoracic vertebra, subsequent encounter for fracture with routine healing: Secondary | ICD-10-CM | POA: Diagnosis not present

## 2023-06-18 DIAGNOSIS — S72141D Displaced intertrochanteric fracture of right femur, subsequent encounter for closed fracture with routine healing: Secondary | ICD-10-CM | POA: Diagnosis not present

## 2023-06-18 DIAGNOSIS — I5040 Unspecified combined systolic (congestive) and diastolic (congestive) heart failure: Secondary | ICD-10-CM | POA: Diagnosis not present

## 2023-06-18 DIAGNOSIS — G2581 Restless legs syndrome: Secondary | ICD-10-CM | POA: Diagnosis not present

## 2023-06-18 DIAGNOSIS — I482 Chronic atrial fibrillation, unspecified: Secondary | ICD-10-CM | POA: Diagnosis not present

## 2023-06-18 DIAGNOSIS — F321 Major depressive disorder, single episode, moderate: Secondary | ICD-10-CM | POA: Diagnosis not present

## 2023-06-18 DIAGNOSIS — I110 Hypertensive heart disease with heart failure: Secondary | ICD-10-CM | POA: Diagnosis not present

## 2023-06-18 DIAGNOSIS — N179 Acute kidney failure, unspecified: Secondary | ICD-10-CM | POA: Diagnosis not present

## 2023-06-18 DIAGNOSIS — D649 Anemia, unspecified: Secondary | ICD-10-CM | POA: Diagnosis not present

## 2023-06-19 DIAGNOSIS — S22000D Wedge compression fracture of unspecified thoracic vertebra, subsequent encounter for fracture with routine healing: Secondary | ICD-10-CM | POA: Diagnosis not present

## 2023-06-19 DIAGNOSIS — I5040 Unspecified combined systolic (congestive) and diastolic (congestive) heart failure: Secondary | ICD-10-CM | POA: Diagnosis not present

## 2023-06-19 DIAGNOSIS — I110 Hypertensive heart disease with heart failure: Secondary | ICD-10-CM | POA: Diagnosis not present

## 2023-06-19 DIAGNOSIS — G2581 Restless legs syndrome: Secondary | ICD-10-CM | POA: Diagnosis not present

## 2023-06-19 DIAGNOSIS — F321 Major depressive disorder, single episode, moderate: Secondary | ICD-10-CM | POA: Diagnosis not present

## 2023-06-19 DIAGNOSIS — I482 Chronic atrial fibrillation, unspecified: Secondary | ICD-10-CM | POA: Diagnosis not present

## 2023-06-19 DIAGNOSIS — S72141D Displaced intertrochanteric fracture of right femur, subsequent encounter for closed fracture with routine healing: Secondary | ICD-10-CM | POA: Diagnosis not present

## 2023-06-19 DIAGNOSIS — D649 Anemia, unspecified: Secondary | ICD-10-CM | POA: Diagnosis not present

## 2023-06-19 DIAGNOSIS — N179 Acute kidney failure, unspecified: Secondary | ICD-10-CM | POA: Diagnosis not present

## 2023-06-20 DIAGNOSIS — R54 Age-related physical debility: Secondary | ICD-10-CM | POA: Diagnosis not present

## 2023-06-20 DIAGNOSIS — I872 Venous insufficiency (chronic) (peripheral): Secondary | ICD-10-CM | POA: Diagnosis not present

## 2023-06-20 DIAGNOSIS — I5022 Chronic systolic (congestive) heart failure: Secondary | ICD-10-CM | POA: Diagnosis not present

## 2023-06-20 DIAGNOSIS — L97211 Non-pressure chronic ulcer of right calf limited to breakdown of skin: Secondary | ICD-10-CM | POA: Diagnosis not present

## 2023-06-20 DIAGNOSIS — H4089 Other specified glaucoma: Secondary | ICD-10-CM | POA: Diagnosis not present

## 2023-06-20 DIAGNOSIS — M810 Age-related osteoporosis without current pathological fracture: Secondary | ICD-10-CM | POA: Diagnosis not present

## 2023-06-20 DIAGNOSIS — I4821 Permanent atrial fibrillation: Secondary | ICD-10-CM | POA: Diagnosis not present

## 2023-06-20 DIAGNOSIS — I10 Essential (primary) hypertension: Secondary | ICD-10-CM | POA: Diagnosis not present

## 2023-06-20 DIAGNOSIS — H6122 Impacted cerumen, left ear: Secondary | ICD-10-CM | POA: Diagnosis not present

## 2023-06-20 DIAGNOSIS — S72141D Displaced intertrochanteric fracture of right femur, subsequent encounter for closed fracture with routine healing: Secondary | ICD-10-CM | POA: Diagnosis not present

## 2023-06-21 DIAGNOSIS — S72141D Displaced intertrochanteric fracture of right femur, subsequent encounter for closed fracture with routine healing: Secondary | ICD-10-CM | POA: Diagnosis not present

## 2023-06-21 DIAGNOSIS — G2581 Restless legs syndrome: Secondary | ICD-10-CM | POA: Diagnosis not present

## 2023-06-21 DIAGNOSIS — I5040 Unspecified combined systolic (congestive) and diastolic (congestive) heart failure: Secondary | ICD-10-CM | POA: Diagnosis not present

## 2023-06-21 DIAGNOSIS — I482 Chronic atrial fibrillation, unspecified: Secondary | ICD-10-CM | POA: Diagnosis not present

## 2023-06-21 DIAGNOSIS — N179 Acute kidney failure, unspecified: Secondary | ICD-10-CM | POA: Diagnosis not present

## 2023-06-21 DIAGNOSIS — D649 Anemia, unspecified: Secondary | ICD-10-CM | POA: Diagnosis not present

## 2023-06-21 DIAGNOSIS — F321 Major depressive disorder, single episode, moderate: Secondary | ICD-10-CM | POA: Diagnosis not present

## 2023-06-21 DIAGNOSIS — I110 Hypertensive heart disease with heart failure: Secondary | ICD-10-CM | POA: Diagnosis not present

## 2023-06-21 DIAGNOSIS — S22000D Wedge compression fracture of unspecified thoracic vertebra, subsequent encounter for fracture with routine healing: Secondary | ICD-10-CM | POA: Diagnosis not present

## 2023-06-22 DIAGNOSIS — S22000D Wedge compression fracture of unspecified thoracic vertebra, subsequent encounter for fracture with routine healing: Secondary | ICD-10-CM | POA: Diagnosis not present

## 2023-06-22 DIAGNOSIS — N179 Acute kidney failure, unspecified: Secondary | ICD-10-CM | POA: Diagnosis not present

## 2023-06-22 DIAGNOSIS — I5040 Unspecified combined systolic (congestive) and diastolic (congestive) heart failure: Secondary | ICD-10-CM | POA: Diagnosis not present

## 2023-06-22 DIAGNOSIS — I110 Hypertensive heart disease with heart failure: Secondary | ICD-10-CM | POA: Diagnosis not present

## 2023-06-22 DIAGNOSIS — F321 Major depressive disorder, single episode, moderate: Secondary | ICD-10-CM | POA: Diagnosis not present

## 2023-06-22 DIAGNOSIS — S72141D Displaced intertrochanteric fracture of right femur, subsequent encounter for closed fracture with routine healing: Secondary | ICD-10-CM | POA: Diagnosis not present

## 2023-06-22 DIAGNOSIS — I482 Chronic atrial fibrillation, unspecified: Secondary | ICD-10-CM | POA: Diagnosis not present

## 2023-06-22 DIAGNOSIS — D649 Anemia, unspecified: Secondary | ICD-10-CM | POA: Diagnosis not present

## 2023-06-22 DIAGNOSIS — G2581 Restless legs syndrome: Secondary | ICD-10-CM | POA: Diagnosis not present

## 2023-06-26 DIAGNOSIS — S22000D Wedge compression fracture of unspecified thoracic vertebra, subsequent encounter for fracture with routine healing: Secondary | ICD-10-CM | POA: Diagnosis not present

## 2023-06-26 DIAGNOSIS — I482 Chronic atrial fibrillation, unspecified: Secondary | ICD-10-CM | POA: Diagnosis not present

## 2023-06-26 DIAGNOSIS — G2581 Restless legs syndrome: Secondary | ICD-10-CM | POA: Diagnosis not present

## 2023-06-26 DIAGNOSIS — I110 Hypertensive heart disease with heart failure: Secondary | ICD-10-CM | POA: Diagnosis not present

## 2023-06-26 DIAGNOSIS — S72141D Displaced intertrochanteric fracture of right femur, subsequent encounter for closed fracture with routine healing: Secondary | ICD-10-CM | POA: Diagnosis not present

## 2023-06-26 DIAGNOSIS — I5040 Unspecified combined systolic (congestive) and diastolic (congestive) heart failure: Secondary | ICD-10-CM | POA: Diagnosis not present

## 2023-06-26 DIAGNOSIS — N179 Acute kidney failure, unspecified: Secondary | ICD-10-CM | POA: Diagnosis not present

## 2023-06-26 DIAGNOSIS — F321 Major depressive disorder, single episode, moderate: Secondary | ICD-10-CM | POA: Diagnosis not present

## 2023-06-26 DIAGNOSIS — D649 Anemia, unspecified: Secondary | ICD-10-CM | POA: Diagnosis not present

## 2023-06-27 DIAGNOSIS — S72141D Displaced intertrochanteric fracture of right femur, subsequent encounter for closed fracture with routine healing: Secondary | ICD-10-CM | POA: Diagnosis not present

## 2023-06-27 DIAGNOSIS — I5040 Unspecified combined systolic (congestive) and diastolic (congestive) heart failure: Secondary | ICD-10-CM | POA: Diagnosis not present

## 2023-06-27 DIAGNOSIS — I482 Chronic atrial fibrillation, unspecified: Secondary | ICD-10-CM | POA: Diagnosis not present

## 2023-06-27 DIAGNOSIS — I110 Hypertensive heart disease with heart failure: Secondary | ICD-10-CM | POA: Diagnosis not present

## 2023-06-27 DIAGNOSIS — D649 Anemia, unspecified: Secondary | ICD-10-CM | POA: Diagnosis not present

## 2023-06-27 DIAGNOSIS — N179 Acute kidney failure, unspecified: Secondary | ICD-10-CM | POA: Diagnosis not present

## 2023-06-27 DIAGNOSIS — G2581 Restless legs syndrome: Secondary | ICD-10-CM | POA: Diagnosis not present

## 2023-06-27 DIAGNOSIS — S22000D Wedge compression fracture of unspecified thoracic vertebra, subsequent encounter for fracture with routine healing: Secondary | ICD-10-CM | POA: Diagnosis not present

## 2023-06-27 DIAGNOSIS — F321 Major depressive disorder, single episode, moderate: Secondary | ICD-10-CM | POA: Diagnosis not present

## 2023-06-28 DIAGNOSIS — L97211 Non-pressure chronic ulcer of right calf limited to breakdown of skin: Secondary | ICD-10-CM | POA: Diagnosis not present

## 2023-06-29 ENCOUNTER — Inpatient Hospital Stay (HOSPITAL_COMMUNITY)
Admission: EM | Admit: 2023-06-29 | Discharge: 2023-07-02 | DRG: 603 | Disposition: A | Discharge status: Home Health | Payer: Medicare HMO | Attending: Internal Medicine | Admitting: Internal Medicine

## 2023-06-29 ENCOUNTER — Emergency Department (HOSPITAL_COMMUNITY): Payer: Medicare HMO

## 2023-06-29 ENCOUNTER — Other Ambulatory Visit: Payer: Self-pay

## 2023-06-29 ENCOUNTER — Encounter (HOSPITAL_COMMUNITY): Payer: Self-pay | Admitting: Emergency Medicine

## 2023-06-29 DIAGNOSIS — I5042 Chronic combined systolic (congestive) and diastolic (congestive) heart failure: Secondary | ICD-10-CM | POA: Diagnosis not present

## 2023-06-29 DIAGNOSIS — Z888 Allergy status to other drugs, medicaments and biological substances status: Secondary | ICD-10-CM

## 2023-06-29 DIAGNOSIS — E785 Hyperlipidemia, unspecified: Secondary | ICD-10-CM | POA: Diagnosis present

## 2023-06-29 DIAGNOSIS — I480 Paroxysmal atrial fibrillation: Secondary | ICD-10-CM | POA: Diagnosis not present

## 2023-06-29 DIAGNOSIS — Z90711 Acquired absence of uterus with remaining cervical stump: Secondary | ICD-10-CM | POA: Diagnosis not present

## 2023-06-29 DIAGNOSIS — F321 Major depressive disorder, single episode, moderate: Secondary | ICD-10-CM | POA: Diagnosis not present

## 2023-06-29 DIAGNOSIS — M7732 Calcaneal spur, left foot: Secondary | ICD-10-CM | POA: Diagnosis not present

## 2023-06-29 DIAGNOSIS — L03119 Cellulitis of unspecified part of limb: Secondary | ICD-10-CM | POA: Diagnosis not present

## 2023-06-29 DIAGNOSIS — G2581 Restless legs syndrome: Secondary | ICD-10-CM | POA: Diagnosis not present

## 2023-06-29 DIAGNOSIS — T8142XA Infection following a procedure, deep incisional surgical site, initial encounter: Secondary | ICD-10-CM | POA: Diagnosis not present

## 2023-06-29 DIAGNOSIS — Z7901 Long term (current) use of anticoagulants: Secondary | ICD-10-CM | POA: Diagnosis not present

## 2023-06-29 DIAGNOSIS — L0390 Cellulitis, unspecified: Principal | ICD-10-CM

## 2023-06-29 DIAGNOSIS — L089 Local infection of the skin and subcutaneous tissue, unspecified: Secondary | ICD-10-CM | POA: Diagnosis not present

## 2023-06-29 DIAGNOSIS — Z885 Allergy status to narcotic agent status: Secondary | ICD-10-CM

## 2023-06-29 DIAGNOSIS — Z79899 Other long term (current) drug therapy: Secondary | ICD-10-CM | POA: Diagnosis not present

## 2023-06-29 DIAGNOSIS — R739 Hyperglycemia, unspecified: Secondary | ICD-10-CM | POA: Diagnosis present

## 2023-06-29 DIAGNOSIS — D649 Anemia, unspecified: Secondary | ICD-10-CM | POA: Diagnosis not present

## 2023-06-29 DIAGNOSIS — I5040 Unspecified combined systolic (congestive) and diastolic (congestive) heart failure: Secondary | ICD-10-CM | POA: Diagnosis not present

## 2023-06-29 DIAGNOSIS — K5900 Constipation, unspecified: Secondary | ICD-10-CM | POA: Diagnosis present

## 2023-06-29 DIAGNOSIS — Z66 Do not resuscitate: Secondary | ICD-10-CM | POA: Diagnosis present

## 2023-06-29 DIAGNOSIS — L03116 Cellulitis of left lower limb: Secondary | ICD-10-CM | POA: Diagnosis not present

## 2023-06-29 DIAGNOSIS — L03115 Cellulitis of right lower limb: Secondary | ICD-10-CM | POA: Diagnosis not present

## 2023-06-29 DIAGNOSIS — I110 Hypertensive heart disease with heart failure: Secondary | ICD-10-CM | POA: Diagnosis not present

## 2023-06-29 DIAGNOSIS — M19072 Primary osteoarthritis, left ankle and foot: Secondary | ICD-10-CM | POA: Diagnosis present

## 2023-06-29 DIAGNOSIS — I10 Essential (primary) hypertension: Secondary | ICD-10-CM | POA: Diagnosis present

## 2023-06-29 DIAGNOSIS — L97211 Non-pressure chronic ulcer of right calf limited to breakdown of skin: Secondary | ICD-10-CM | POA: Diagnosis not present

## 2023-06-29 DIAGNOSIS — Z7984 Long term (current) use of oral hypoglycemic drugs: Secondary | ICD-10-CM | POA: Diagnosis not present

## 2023-06-29 DIAGNOSIS — H9190 Unspecified hearing loss, unspecified ear: Secondary | ICD-10-CM | POA: Diagnosis present

## 2023-06-29 DIAGNOSIS — R5381 Other malaise: Secondary | ICD-10-CM | POA: Diagnosis present

## 2023-06-29 DIAGNOSIS — E872 Acidosis, unspecified: Secondary | ICD-10-CM | POA: Diagnosis not present

## 2023-06-29 DIAGNOSIS — S72141D Displaced intertrochanteric fracture of right femur, subsequent encounter for closed fracture with routine healing: Secondary | ICD-10-CM | POA: Diagnosis not present

## 2023-06-29 DIAGNOSIS — I482 Chronic atrial fibrillation, unspecified: Secondary | ICD-10-CM | POA: Diagnosis not present

## 2023-06-29 DIAGNOSIS — I4891 Unspecified atrial fibrillation: Secondary | ICD-10-CM | POA: Diagnosis present

## 2023-06-29 DIAGNOSIS — S22000D Wedge compression fracture of unspecified thoracic vertebra, subsequent encounter for fracture with routine healing: Secondary | ICD-10-CM | POA: Diagnosis not present

## 2023-06-29 DIAGNOSIS — M7989 Other specified soft tissue disorders: Secondary | ICD-10-CM | POA: Diagnosis not present

## 2023-06-29 DIAGNOSIS — N179 Acute kidney failure, unspecified: Secondary | ICD-10-CM | POA: Diagnosis not present

## 2023-06-29 LAB — CBC WITH DIFFERENTIAL/PLATELET
Abs Immature Granulocytes: 0.02 10*3/uL (ref 0.00–0.07)
Basophils Absolute: 0 10*3/uL (ref 0.0–0.1)
Basophils Relative: 1 %
Eosinophils Absolute: 0.2 10*3/uL (ref 0.0–0.5)
Eosinophils Relative: 2 %
HCT: 46.3 % — ABNORMAL HIGH (ref 36.0–46.0)
Hemoglobin: 13.8 g/dL (ref 12.0–15.0)
Immature Granulocytes: 0 %
Lymphocytes Relative: 16 %
Lymphs Abs: 1.1 10*3/uL (ref 0.7–4.0)
MCH: 27.5 pg (ref 26.0–34.0)
MCHC: 29.8 g/dL — ABNORMAL LOW (ref 30.0–36.0)
MCV: 92.2 fL (ref 80.0–100.0)
Monocytes Absolute: 0.5 10*3/uL (ref 0.1–1.0)
Monocytes Relative: 8 %
Neutro Abs: 5.1 10*3/uL (ref 1.7–7.7)
Neutrophils Relative %: 73 %
Platelets: 217 10*3/uL (ref 150–400)
RBC: 5.02 MIL/uL (ref 3.87–5.11)
RDW: 19.3 % — ABNORMAL HIGH (ref 11.5–15.5)
WBC: 7 10*3/uL (ref 4.0–10.5)
nRBC: 0 % (ref 0.0–0.2)

## 2023-06-29 LAB — COMPREHENSIVE METABOLIC PANEL
ALT: 11 U/L (ref 0–44)
AST: 22 U/L (ref 15–41)
Albumin: 3.4 g/dL — ABNORMAL LOW (ref 3.5–5.0)
Alkaline Phosphatase: 115 U/L (ref 38–126)
Anion gap: 8 (ref 5–15)
BUN: 31 mg/dL — ABNORMAL HIGH (ref 8–23)
CO2: 30 mmol/L (ref 22–32)
Calcium: 9.6 mg/dL (ref 8.9–10.3)
Chloride: 102 mmol/L (ref 98–111)
Creatinine, Ser: 0.97 mg/dL (ref 0.44–1.00)
GFR, Estimated: 54 mL/min — ABNORMAL LOW (ref >60)
Glucose, Bld: 118 mg/dL — ABNORMAL HIGH (ref 70–99)
Potassium: 5 mmol/L (ref 3.5–5.1)
Sodium: 140 mmol/L (ref 135–145)
Total Bilirubin: 1.2 mg/dL (ref 0.0–1.2)
Total Protein: 8.2 g/dL — ABNORMAL HIGH (ref 6.5–8.1)

## 2023-06-29 LAB — I-STAT CG4 LACTIC ACID, ED
Lactic Acid, Venous: 1.3 mmol/L (ref 0.5–1.9)
Lactic Acid, Venous: 2 mmol/L (ref 0.5–1.9)

## 2023-06-29 MED ORDER — SODIUM CHLORIDE 0.9 % IV SOLN
2.0000 g | INTRAVENOUS | Status: DC
  Administered 2023-06-30 – 2023-07-01 (×3): 2 g via INTRAVENOUS
  Filled 2023-06-29 (×3): qty 20

## 2023-06-29 MED ORDER — SODIUM CHLORIDE 0.9 % IV SOLN: 2.0000 g | Freq: Once | INTRAVENOUS | Status: DC

## 2023-06-29 MED ORDER — MELATONIN 5 MG PO TABS
5.0000 mg | ORAL_TABLET | Freq: Every evening | ORAL | Status: DC | PRN
  Administered 2023-06-30: 5 mg via ORAL
  Filled 2023-06-29: qty 1

## 2023-06-29 MED ORDER — ACETAMINOPHEN 325 MG PO TABS
650.0000 mg | ORAL_TABLET | Freq: Four times a day (QID) | ORAL | Status: DC | PRN
  Administered 2023-06-30 (×2): 650 mg via ORAL
  Filled 2023-06-29 (×2): qty 2

## 2023-06-29 MED ORDER — PROCHLORPERAZINE EDISYLATE 10 MG/2ML IJ SOLN: 5.0000 mg | Freq: Four times a day (QID) | INTRAMUSCULAR | Status: DC | PRN

## 2023-06-29 MED ORDER — POLYETHYLENE GLYCOL 3350 17 G PO PACK: 17.0000 g | PACK | Freq: Every day | ORAL | Status: DC | PRN

## 2023-06-29 NOTE — H&P (Incomplete)
History and Physical  Danielle Thomas JXB:[id number] DOB: 24-May-1927 DOA: 06/29/2023  Referring physician: Marland Kitchen  PCP: Gweneth Dimitri, MD  Outpatient Specialists: *** Patient coming from: *** & is able to ambulate ***  Chief Complaint: ***   HPI: Danielle Thomas is a 89 y.o. female with medical history significant for *** (For level 3, the HPI must include 4+ descriptors: Location, Quality, Severity, Duration, Timing, Context, modifying factors, associated signs/symptoms and/or status of 3+ chronic problems.)   ED Course: ***  Review of Systems: Review of systems as noted in the HPI. All other systems reviewed and are negative.   Past Medical History:  Diagnosis Date   Acute CHF (congestive heart failure) (HCC) 07/13/2022   Does use hearing aid    Hypertension    Past Surgical History:  Procedure Laterality Date   INTRAMEDULLARY (IM) NAIL INTERTROCHANTERIC Right 04/23/2023   Procedure: INTRAMEDULLARY (IM) NAIL INTERTROCHANTERIC;  Surgeon: Myrene Galas, MD;  Location: [facility] OR;  Service: Orthopedics;  Laterality: Right;   KNEE SURGERY     LUMBAR PERCUTANEOUS PEDICLE SCREW 2 LEVEL N/A 05/04/2020   Procedure: Thoracic nine to Thoracic twelve percutaneous pedicle screws;  Surgeon: Coletta Memos, MD;  Location: [facility] OR;  Service: Neurosurgery;  Laterality: N/A;   LUNG BIOPSY     PARTIAL HYSTERECTOMY     TONSILLECTOMY AND ADENOIDECTOMY      Social History:  reports that she has never smoked. She has never used smokeless tobacco. She reports that she does not drink alcohol and does not use drugs.   Allergies  Allergen Reactions   Amlodipine Besylate     Other reaction(s): high dose make feet swell   Codeine Nausea And Vomiting   Meperidine Nausea Only   Metoprolol Succinate [Metoprolol]     Other reaction(s): effects her breathing   Oxycodone Nausea And Vomiting    Family History  Problem Relation Age of Onset   Other Other        NO HEALTH ISSUES    ***  Prior to Admission  medications   Medication Sig Start Date End Date Taking? Authorizing Provider  acetaminophen (TYLENOL) 500 MG tablet Take 650 mg by mouth every 6 (six) hours as needed for mild pain (pain score 1-3).    [provider]  apixaban (ELIQUIS) 2.5 MG TABS tablet Take 1 tablet (2.5 mg total) by mouth 2 (two) times daily. 08/08/22   Marjie Skiff E, PA-C  dorzolamide (TRUSOPT) 2 % ophthalmic solution Place 1 drop into both eyes 2 (two) times daily.     [provider]  empagliflozin (JARDIANCE) 10 MG TABS tablet Take 1 tablet (10 mg total) by mouth daily. 08/15/22   O'NealRonnald Ramp, MD  furosemide (LASIX) 20 MG tablet Take 1 tablet (20 mg total) by mouth daily. Take tablet (20 mg) by mouth daily. May take extra tablet by mouth if weight gain of 3 lbs overnight, 5 lbs in a week, and/or swelling of lower extremities 03/28/23   O'Neal, Ronnald Ramp, MD  latanoprost (XALATAN) 0.005 % ophthalmic solution Place 1 drop into both eyes at bedtime. 06/17/20   Fargo, Amy E, NP  losartan (COZAAR) 50 MG tablet TAKE 1 TABLET EVERY DAY (NEW DOSE) 01/03/23   Marjie Skiff E, PA-C  polyethylene glycol (MIRALAX / GLYCOLAX) 17 g packet Take 17 g by mouth 2 (two) times daily. 04/26/23   Marinda Elk, MD  potassium chloride SA (KLOR-CON M) 20 MEQ tablet TAKE 1 TABLET EVERY DAY 01/26/23  Marjie Skiff E, PA-C  rOPINIRole (REQUIP) 0.25 MG tablet Take 1 tablet (0.25 mg total) by mouth daily as needed (restless legs around 10am - home dose). Patient taking differently: Take 0.25 mg by mouth 4 (four) times daily as needed (restless leg). Taken 4pm and 8pm and an additional dose in the middle of the night and possibly in the morning. 06/17/20   Octavia Heir, NP    Physical Exam: BP (!) 160/105   Pulse 97   Temp (!) 97.5 F (36.4 C) (Oral)   Resp 18   Wt 55.7 kg   SpO2 94%   BMI 23.20 kg/m   General: 89 y.o. year-old female well developed well nourished in no acute distress.  Alert and  oriented x3. Cardiovascular: Regular rate and rhythm with no rubs or gallops.  No thyromegaly or JVD noted.  No lower extremity edema. 2/4 pulses in all 4 extremities. Respiratory: Clear to auscultation with no wheezes or rales. Good inspiratory effort. Abdomen: Soft nontender nondistended with normal bowel sounds x4 quadrants. Muskuloskeletal: No cyanosis, clubbing or edema noted bilaterally Neuro: CN II-XII intact, strength, sensation, reflexes Skin: No ulcerative lesions noted or rashes Psychiatry: Judgement and insight appear normal. Mood is appropriate for condition and setting          Labs on Admission:  Basic Metabolic Panel: Recent Labs  Lab 06/29/23 2115  NA 140  K 5.0  CL 102  CO2 30  GLUCOSE 118*  BUN 31*  CREATININE 0.97  CALCIUM 9.6   Liver Function Tests: Recent Labs  Lab 06/29/23 2115  AST 22  ALT 11  ALKPHOS 115  BILITOT 1.2  PROT 8.2*  ALBUMIN 3.4*   No results for input(s): "LIPASE", "AMYLASE" in the last 168 hours. No results for input(s): "AMMONIA" in the last 168 hours. CBC: Recent Labs  Lab 06/29/23 2115  WBC 7.0  NEUTROABS 5.1  HGB 13.8  HCT 46.3*  MCV 92.2  PLT 217   Cardiac Enzymes: No results for input(s): "CKTOTAL", "CKMB", "CKMBINDEX", "TROPONINI" in the last 168 hours.  BNP (last 3 results) Recent Labs    07/13/22 1845  BNP 1,998.9*    ProBNP (last 3 results) No results for input(s): "PROBNP" in the last 8760 hours.  CBG: No results for input(s): "GLUCAP" in the last 168 hours.  Radiological Exams on Admission: DG Foot Complete Left Result Date: 06/29/2023 CLINICAL DATA:  Left foot infection, dorsal soft tissue wound EXAM: LEFT FOOT - COMPLETE 3+ VIEW COMPARISON:  None Available. FINDINGS: Frontal, oblique, and lateral views of the left foot are obtained. There is marked dorsal soft tissue swelling of the left forefoot and midfoot. No evidence of bony destruction or periosteal reaction to suggest osteomyelitis. There is  an acute nondisplaced intra-articular fracture at the medial aspect of the base of the first proximal phalanx. Evidence of prior healed fifth proximal phalangeal fracture. Mild osteoarthritis throughout the midfoot, most pronounced at the talonavicular joint. Small inferior calcaneal spur. IMPRESSION: 1. Marked dorsal soft tissue swelling of the forefoot and midfoot. No bony destruction or periosteal reaction to suggest osteomyelitis. 2. Acute nondisplaced intra-articular fracture at the medial aspect of the base of the first proximal phalanx. 3. Midfoot osteoarthritis. 4. Inferior calcaneal spur. Electronically Signed   By: Sharlet Salina M.D.   On: 06/29/2023 21:58    EKG: I independently viewed the EKG done and my findings are as followed: ***   Assessment/Plan Present on Admission:  Cellulitis of lower extremity  Principal Problem:  Cellulitis of lower extremity   DVT prophylaxis: ***   Code Status: ***   Family Communication: ***   Disposition Plan: ***   Consults called: ***   Admission status: ***    Status is: Inpatient {Inpatient:23812}   Darlin Drop MD [facility] Pager [phone]  If 7PM-7AM, please contact night-coverage [email or web address] Password [facility]  06/29/2023, 11:17 PM

## 2023-06-29 NOTE — ED Triage Notes (Signed)
Pt sent by [facility] for eval of L foot infection. Son is also present who states pt had a R hip surgery in December, has developed a weeping wound to the top of her L foot since. Doctor is concerned for possible infection to bone.

## 2023-06-29 NOTE — ED Provider Notes (Signed)
 [place] EMERGENCY DEPARTMENT AT [facility] Provider Note   CSN: [id number] Arrival date & time: 06/29/23  2048     History Chief Complaint  Patient presents with   Foot Infection    HPI Danielle Thomas is a 89 y.o. female presenting for left foot redness and swelling.  Longstanding wound on this foot but usually well-managed follows with wound care for recurrent lower extremity edema. Has been compliant on medications started having subjective fevers chills and foot pain and swelling.   Patient's recorded medical, surgical, social, medication list and allergies were reviewed in the Snapshot window as part of the initial history.   Review of Systems   Review of Systems  Constitutional:  Negative for chills and fever.  HENT:  Negative for ear pain and sore throat.   Eyes:  Negative for pain and visual disturbance.  Respiratory:  Negative for cough and shortness of breath.   Cardiovascular:  Negative for chest pain and palpitations.  Gastrointestinal:  Negative for abdominal pain and vomiting.  Genitourinary:  Negative for dysuria and hematuria.  Musculoskeletal:  Negative for arthralgias and back pain.  Skin:  Positive for rash. Negative for color change.  Neurological:  Negative for seizures and syncope.  All other systems reviewed and are negative.   Physical Exam Updated Vital Signs BP (!) 160/105   Pulse 97   Temp (!) 97.5 F (36.4 C) (Oral)   Resp 18   Wt 55.7 kg   SpO2 94%   BMI 23.20 kg/m  Physical Exam Vitals and nursing note reviewed.  Constitutional:      General: She is not in acute distress.    Appearance: She is well-developed.  HENT:     Head: Normocephalic and atraumatic.  Eyes:     Conjunctiva/sclera: Conjunctivae normal.  Cardiovascular:     Rate and Rhythm: Normal rate and regular rhythm.     Heart sounds: No murmur heard. Pulmonary:     Effort: Pulmonary effort is normal. No respiratory distress.     Breath sounds: Normal  breath sounds.  Abdominal:     General: There is no distension.     Palpations: Abdomen is soft.     Tenderness: There is no abdominal tenderness. There is no right CVA tenderness or left CVA tenderness.  Musculoskeletal:        General: Deformity and signs of injury present. No swelling or tenderness. Normal range of motion.     Cervical back: Neck supple.  Skin:    General: Skin is warm and dry.  Neurological:     General: No focal deficit present.     Mental Status: She is alert and oriented to person, place, and time. Mental status is at baseline.     Cranial Nerves: No cranial nerve deficit.      ED Course/ Medical Decision Making/ A&P    Procedures Procedures   Medications Ordered in ED Medications  cefTRIAXone (ROCEPHIN) 2 g in sodium chloride 0.9 % 100 mL IVPB (has no administration in time range)  acetaminophen (TYLENOL) tablet 650 mg (has no administration in time range)  prochlorperazine (COMPAZINE) injection 5 mg (has no administration in time range)  melatonin tablet 5 mg (has no administration in time range)  polyethylene glycol (MIRALAX / GLYCOLAX) packet 17 g (has no administration in time range)    Medical Decision Making:   89 year old female sent in for foot infection. X-ray performed does not show osteomyelitis.  Patient is not [place] for sepsis.  Treat for cellulitis given lack of diabetes with Rocephin.  Blood cultures drawn.  Disposition:   Based on the above findings, I believe this patient is stable for admission.    Patient/family educated about specific findings on our evaluation and explained exact reasons for admission.  Patient/family educated about clinical situation and time was allowed to answer questions.   Admission team communicated with and agreed with need for admission. Patient admitted. Patient ready to move at this time.     Emergency Department Medication Summary:   Medications  cefTRIAXone (ROCEPHIN) 2 g in sodium chloride 0.9  % 100 mL IVPB (has no administration in time range)  acetaminophen (TYLENOL) tablet 650 mg (has no administration in time range)  prochlorperazine (COMPAZINE) injection 5 mg (has no administration in time range)  melatonin tablet 5 mg (has no administration in time range)  polyethylene glycol (MIRALAX / GLYCOLAX) packet 17 g (has no administration in time range)         Clinical Impression:  1. Cellulitis, unspecified cellulitis site      Admit   Final Clinical Impression(s) / ED Diagnoses Final diagnoses:  Cellulitis, unspecified cellulitis site    Rx / DC Orders ED Discharge Orders     None         Glyn Ade, MD 06/30/23 0005

## 2023-06-30 DIAGNOSIS — L03119 Cellulitis of unspecified part of limb: Secondary | ICD-10-CM

## 2023-06-30 DIAGNOSIS — R739 Hyperglycemia, unspecified: Secondary | ICD-10-CM | POA: Insufficient documentation

## 2023-06-30 DIAGNOSIS — E872 Acidosis, unspecified: Secondary | ICD-10-CM | POA: Diagnosis present

## 2023-06-30 LAB — CBC
HCT: 40.4 % (ref 36.0–46.0)
Hemoglobin: 12.3 g/dL (ref 12.0–15.0)
MCH: 27.7 pg (ref 26.0–34.0)
MCHC: 30.4 g/dL (ref 30.0–36.0)
MCV: 91 fL (ref 80.0–100.0)
Platelets: 208 10*3/uL (ref 150–400)
RBC: 4.44 MIL/uL (ref 3.87–5.11)
RDW: 19.1 % — ABNORMAL HIGH (ref 11.5–15.5)
WBC: 7.5 10*3/uL (ref 4.0–10.5)
nRBC: 0 % (ref 0.0–0.2)

## 2023-06-30 LAB — COMPREHENSIVE METABOLIC PANEL
ALT: 10 U/L (ref 0–44)
AST: 18 U/L (ref 15–41)
Albumin: 3 g/dL — ABNORMAL LOW (ref 3.5–5.0)
Alkaline Phosphatase: 94 U/L (ref 38–126)
Anion gap: 9 (ref 5–15)
BUN: 28 mg/dL — ABNORMAL HIGH (ref 8–23)
CO2: 29 mmol/L (ref 22–32)
Calcium: 8.9 mg/dL (ref 8.9–10.3)
Chloride: 103 mmol/L (ref 98–111)
Creatinine, Ser: 0.74 mg/dL (ref 0.44–1.00)
GFR, Estimated: >60 mL/min (ref >60)
Glucose, Bld: 159 mg/dL — ABNORMAL HIGH (ref 70–99)
Potassium: 3.8 mmol/L (ref 3.5–5.1)
Sodium: 141 mmol/L (ref 135–145)
Total Bilirubin: 0.7 mg/dL (ref 0.0–1.2)
Total Protein: 7.1 g/dL (ref 6.5–8.1)

## 2023-06-30 LAB — LACTIC ACID, PLASMA: Lactic Acid, Venous: 2 mmol/L (ref 0.5–1.9)

## 2023-06-30 LAB — GLUCOSE, CAPILLARY: Glucose-Capillary: 120 mg/dL — ABNORMAL HIGH (ref 70–99)

## 2023-06-30 LAB — PROTIME-INR
INR: 1.6 — ABNORMAL HIGH (ref 0.8–1.2)
Prothrombin Time: 19 s — ABNORMAL HIGH (ref 11.4–15.2)

## 2023-06-30 MED ORDER — METHOCARBAMOL 500 MG PO TABS: 750.0000 mg | ORAL_TABLET | Freq: Every evening | ORAL | Status: DC | PRN

## 2023-06-30 MED ORDER — LATANOPROST 0.005 % OP SOLN
1.0000 [drp] | Freq: Every day | OPHTHALMIC | Status: DC
  Administered 2023-06-30 – 2023-07-01 (×2): 1 [drp] via OPHTHALMIC
  Filled 2023-06-30: qty 2.5

## 2023-06-30 MED ORDER — LOSARTAN POTASSIUM 50 MG PO TABS
50.0000 mg | ORAL_TABLET | Freq: Every day | ORAL | Status: DC
Start: 2023-06-30 — End: 2023-07-02
  Administered 2023-06-30 – 2023-07-02 (×3): 50 mg via ORAL
  Filled 2023-06-30 (×3): qty 1

## 2023-06-30 MED ORDER — INSULIN ASPART 100 UNIT/ML IJ SOLN: 0.0000 [IU] | Freq: Three times a day (TID) | INTRAMUSCULAR | Status: DC

## 2023-06-30 MED ORDER — TRAZODONE HCL 50 MG PO TABS
25.0000 mg | ORAL_TABLET | Freq: Every evening | ORAL | Status: DC | PRN
  Administered 2023-06-30 – 2023-07-01 (×2): 25 mg via ORAL
  Filled 2023-06-30 (×3): qty 1

## 2023-06-30 MED ORDER — APIXABAN 2.5 MG PO TABS
2.5000 mg | ORAL_TABLET | Freq: Two times a day (BID) | ORAL | Status: DC
  Administered 2023-06-30 – 2023-07-02 (×5): 2.5 mg via ORAL
  Filled 2023-06-30 (×5): qty 1

## 2023-06-30 MED ORDER — POTASSIUM CHLORIDE CRYS ER 20 MEQ PO TBCR
20.0000 meq | EXTENDED_RELEASE_TABLET | Freq: Every day | ORAL | Status: DC
  Administered 2023-06-30 – 2023-07-02 (×3): 20 meq via ORAL
  Filled 2023-06-30 (×3): qty 1

## 2023-06-30 MED ORDER — GABAPENTIN 100 MG PO CAPS
100.0000 mg | ORAL_CAPSULE | Freq: Every day | ORAL | Status: DC
  Administered 2023-06-30 – 2023-07-01 (×2): 100 mg via ORAL
  Filled 2023-06-30 (×2): qty 1

## 2023-06-30 MED ORDER — TRAMADOL HCL 50 MG PO TABS
50.0000 mg | ORAL_TABLET | Freq: Four times a day (QID) | ORAL | Status: DC | PRN
  Administered 2023-06-30 – 2023-07-02 (×2): 50 mg via ORAL
  Filled 2023-06-30 (×5): qty 1

## 2023-06-30 MED ORDER — ROPINIROLE HCL 0.25 MG PO TABS
0.2500 mg | ORAL_TABLET | Freq: Every day | ORAL | Status: DC | PRN
  Administered 2023-06-30: 0.25 mg via ORAL
  Filled 2023-06-30 (×3): qty 1

## 2023-06-30 MED ORDER — EMPAGLIFLOZIN 10 MG PO TABS
10.0000 mg | ORAL_TABLET | Freq: Every day | ORAL | Status: DC
  Administered 2023-06-30 – 2023-07-02 (×3): 10 mg via ORAL
  Filled 2023-06-30 (×3): qty 1

## 2023-06-30 MED ORDER — FUROSEMIDE 40 MG PO TABS
40.0000 mg | ORAL_TABLET | Freq: Every day | ORAL | Status: DC
  Administered 2023-07-01 – 2023-07-02 (×2): 40 mg via ORAL
  Filled 2023-06-30 (×2): qty 1

## 2023-06-30 MED ORDER — DORZOLAMIDE HCL 2 % OP SOLN
1.0000 [drp] | Freq: Two times a day (BID) | OPHTHALMIC | Status: DC
  Administered 2023-06-30 – 2023-07-02 (×4): 1 [drp] via OPHTHALMIC
  Filled 2023-06-30: qty 10

## 2023-06-30 NOTE — H&P (Signed)
 History and Physical    Patient: Danielle Thomas:[id number] DOB: [birth year] DOA: 06/29/2023 DOS: the patient was seen and examined on 06/30/2023 PCP: Gweneth Dimitri, MD  Patient coming from: Home  Chief Complaint:  Chief Complaint  Patient presents with   Foot Infection   HPI: Danielle Thomas is a 89 y.o. female with medical history significant of impaired hearing who has been dealing with a left foot dorsal aspect wound after she had right hip surgery in December who was sent by her primary care doctor to the emergency department yesterday due to worsening of these wound.  She also has a wound on her left hand dorsal aspect.  She has been taking antibiotics, but has felt febrile with chills.  She denied chest pain, dyspnea, palpitations, diaphoresis, but stated that her lower extremity edema or swelling frequently.  No abdominal pain, nausea, emesis, diarrhea, melena or hematochezia.  She occasionally gets constipated.  No flank pain, dysuria, frequency or hematuria.  No polyuria, polydipsia, polyphagia or blurred vision.  ED course: Initial vital signs were temperature 97.5 F, pulse 85, respiration 18, BP 168/100 mmHg O2 sat 96% on room air.  She was started on ceftriaxone.  Lab work: CBC showed a white count of 7.5, hemoglobin 12.3 g/dL platelets 956.  PT was 19.0 and INR 1.6.  Lactic acid was 1.3 and then 2.0 mmol/L x 2.  CMP showed a glucose of 118, BUN 31 and creatinine 0.97 mg/dL.  Total protein is 8.2 and albumin 3.4 g/dL, the rest of the LFTs and electrolytes were normal.  Imaging: Left foot x-ray showed marked dorsal soft tissue swelling of the forefoot and midfoot.  No signs of osteomyelitis.  There is an acute nondisplaced intra-articular fracture at the medial aspect of the base of the first proximal phalanx.  Midfoot osteoarthritis.  Inferior calcaneal spur.   Review of Systems: As mentioned in the history of present illness. All other systems reviewed and are negative. Past  Medical History:  Diagnosis Date   Acute CHF (congestive heart failure) (HCC) 07/13/2022   Does use hearing aid    Hypertension    Past Surgical History:  Procedure Laterality Date   INTRAMEDULLARY (IM) NAIL INTERTROCHANTERIC Right 04/23/2023   Procedure: INTRAMEDULLARY (IM) NAIL INTERTROCHANTERIC;  Surgeon: Myrene Galas, MD;  Location: [facility] OR;  Service: Orthopedics;  Laterality: Right;   KNEE SURGERY     LUMBAR PERCUTANEOUS PEDICLE SCREW 2 LEVEL N/A 05/04/2020   Procedure: Thoracic nine to Thoracic twelve percutaneous pedicle screws;  Surgeon: Coletta Memos, MD;  Location: [facility] OR;  Service: Neurosurgery;  Laterality: N/A;   LUNG BIOPSY     PARTIAL HYSTERECTOMY     TONSILLECTOMY AND ADENOIDECTOMY     Social History:  reports that she has never smoked. She has never used smokeless tobacco. She reports that she does not drink alcohol and does not use drugs.  Allergies  Allergen Reactions   Amlodipine Besylate     Other reaction(s): high dose make feet swell   Codeine Nausea And Vomiting   Meperidine Nausea Only   Metoprolol Succinate [Metoprolol]     Other reaction(s): effects her breathing   Oxycodone Nausea And Vomiting    Family History  Problem Relation Age of Onset   Other Other        NO HEALTH ISSUES    Prior to Admission medications   Medication Sig Start Date End Date Taking? Authorizing Provider  acetaminophen (TYLENOL) 500 MG tablet Take 650 mg by mouth every  6 (six) hours as needed for mild pain (pain score 1-3).   Yes [provider]  apixaban (ELIQUIS) 2.5 MG TABS tablet Take 1 tablet (2.5 mg total) by mouth 2 (two) times daily. 08/08/22  Yes Marjie Skiff E, PA-C  dorzolamide (TRUSOPT) 2 % ophthalmic solution Place 1 drop into both eyes 2 (two) times daily.    Yes [provider]  dorzolamide-timolol (COSOPT) 2-0.5 % ophthalmic solution Place 1 drop into both eyes 2 (two) times daily. 06/09/23  Yes [provider]  empagliflozin  (JARDIANCE) 10 MG TABS tablet Take 1 tablet (10 mg total) by mouth daily. 08/15/22  Yes O'Neal, Ronnald Ramp, MD  furosemide (LASIX) 20 MG tablet Take 1 tablet (20 mg total) by mouth daily. Take tablet (20 mg) by mouth daily. May take extra tablet by mouth if weight gain of 3 lbs overnight, 5 lbs in a week, and/or swelling of lower extremities Patient taking differently: Take 40 mg by mouth daily. 03/28/23  Yes O'Neal, Ronnald Ramp, MD  gabapentin (NEURONTIN) 100 MG capsule Take 100 mg by mouth at bedtime. 01/15/23  Yes [provider]  latanoprost (XALATAN) 0.005 % ophthalmic solution Place 1 drop into both eyes at bedtime. 06/17/20  Yes Fargo, Amy E, NP  losartan (COZAAR) 50 MG tablet TAKE 1 TABLET EVERY DAY (NEW DOSE) 01/03/23  Yes Marjie Skiff E, PA-C  methocarbamol (ROBAXIN) 500 MG tablet 750 mg at bedtime.   Yes [provider]  potassium chloride SA (KLOR-CON M) 20 MEQ tablet TAKE 1 TABLET EVERY DAY 01/26/23  Yes Marjie Skiff E, PA-C  rOPINIRole (REQUIP) 0.25 MG tablet Take 1 tablet (0.25 mg total) by mouth daily as needed (restless legs around 10am - home dose). Patient taking differently: Take 0.25 mg by mouth 4 (four) times daily as needed (restless leg). Takes 1 tab in the morning, 2 tabs in the afternoon, and three at night before bed 06/17/20  Yes Fargo, Amy E, NP  HYDROcodone-acetaminophen (NORCO/VICODIN) 5-325 MG tablet Take 1 tablet by mouth every 8 (eight) hours. Patient not taking: Reported on 06/30/2023    [provider]  polyethylene glycol (MIRALAX / GLYCOLAX) 17 g packet Take 17 g by mouth 2 (two) times daily. Patient not taking: Reported on 06/30/2023 04/26/23   Marinda Elk, MD  traMADol (ULTRAM) 50 MG tablet Take 50-100 mg by mouth every 12 (twelve) hours as needed for moderate pain (pain score 4-6) or severe pain (pain score 7-10). Patient not taking: Reported on 06/30/2023 06/06/23   [provider]    Physical Exam: Vitals:    06/30/23 0000 06/30/23 0200 06/30/23 0344 06/30/23 0502  BP:  (!) 176/116  120/87  Pulse:  (!) 109  86  Resp:  20  18  Temp: 97.7 F (36.5 C)  97.8 F (36.6 C)   TempSrc: Oral  Oral   SpO2:  94%  94%  Weight:       Physical Exam Vitals and nursing note reviewed.  Constitutional:      General: She is awake. She is not in acute distress.    Appearance: Normal appearance. She is ill-appearing.  HENT:     Head: Normocephalic.     Nose: No rhinorrhea.     Mouth/Throat:     Mouth: Mucous membranes are moist.  Eyes:     General: No scleral icterus.    Pupils: Pupils are equal, round, and reactive to light.  Neck:     Vascular: No JVD.  Cardiovascular:  Rate and Rhythm: Normal rate and regular rhythm.     Heart sounds: S1 normal and S2 normal.  Pulmonary:     Effort: Pulmonary effort is normal.     Breath sounds: Examination of the right-lower field reveals decreased breath sounds. Examination of the left-lower field reveals decreased breath sounds. Decreased breath sounds present. No wheezing, rhonchi or rales.  Abdominal:     General: Bowel sounds are normal. There is no distension.     Palpations: Abdomen is soft.     Tenderness: There is no abdominal tenderness.  Musculoskeletal:     Cervical back: Neck supple.     Right lower leg: 1+ Pitting Edema present.     Left lower leg: 1+ Pitting Edema present.  Skin:    General: Skin is warm and dry.     Findings: Erythema, lesion and wound present.     Comments: Left hand dorsal aspect, right pretibial area, left lower ankle lateral area and left foot dorsal aspect wounds.  See pictures below.  Neurological:     General: No focal deficit present.     Mental Status: She is alert and oriented to person, place, and time.  Psychiatric:        Mood and Affect: Mood normal.        Behavior: Behavior is cooperative.              Data Reviewed:  Results are pending, will review when available. 07/14/2022  echocardiogram report. IMPRESSIONS:   1. Left ventricular ejection fraction, by estimation, is 45 to 50%. The  left ventricle has mildly decreased function. The left ventricle  demonstrates global hypokinesis. Left ventricular diastolic parameters are  indeterminate.   2. Right ventricular systolic function is moderately reduced. The right  ventricular size is mildly enlarged. There is severely elevated pulmonary  artery systolic pressure. The estimated right ventricular systolic  pressure is 66.0 mmHg.   3. Left atrial size was severely dilated.   4. Right atrial size was severely dilated.   5. The mitral valve is normal in structure. Mild mitral valve  regurgitation. No evidence of mitral stenosis.   6. The aortic valve is tricuspid. There is mild calcification of the  aortic valve. Aortic valve regurgitation is trivial. No aortic stenosis is  present.   7. The inferior vena cava is dilated in size with <50% respiratory  variability, suggesting right atrial pressure of 15 mmHg.   8. The patient was in atrial fibrillation.    Assessment and Plan: Principal Problem:   Cellulitis of lower extremity Admit to telemetry/inpatient. Continue ceftriaxone 2 g IVPB daily. Analgesics as needed. Follow-up blood culture and sensitivity. Follow-up CBC and chemistry in the morning. Tight blood glucose control. Consult wound care in AM.  Active Problems:   Lactic acidosis Minimal. Does not look septic. Given CHF history, low LA level no IVF.    Hyperglycemia On Jardiance. Check hemoglobin A1c. CBG monitoring with RI SS. Switch diet to carb modified.    Atrial fibrillation (HCC) Continue apixaban 2.5 mg p.o. twice daily.    Essential hypertension Continue losartan 50 mg p.o. daily.    Hyperlipidemia Currently not on medical therapy. Follow-up with primary care provider.    Chronic combined systolic (congestive) and diastolic (congestive) heart failure (HCC) Continue ARB and  diuretic as above.    Advance Care Planning:   Code Status: Limited: Do not attempt resuscitation (DNR) -DNR-LIMITED -Do Not Intubate/DNI    Consults:   Family Communication:  Severity of Illness: The appropriate patient status for this patient is INPATIENT. Inpatient status is judged to be reasonable and necessary in order to provide the required intensity of service to ensure the patient's safety. The patient's presenting symptoms, physical exam findings, and initial radiographic and laboratory data in the context of their chronic comorbidities is felt to place them at high risk for further clinical deterioration. Furthermore, it is not anticipated that the patient will be medically stable for discharge from the hospital within 2 midnights of admission.   * I certify that at the point of admission it is my clinical judgment that the patient will require inpatient hospital care spanning beyond 2 midnights from the point of admission due to high intensity of service, high risk for further deterioration and high frequency of surveillance required.*  Author: Bobette Mo, MD 06/30/2023 7:35 AM  For on call review [email or web address].   This document was prepared using Dragon voice recognition software and may contain some unintended transcription errors.

## 2023-06-30 NOTE — ED Notes (Signed)
 Pt with c/o her RLS bothering her really bad this am. This RN sat in the room with Pt and allowed her to dangle her feet along the side of the bed for 15 mins. Also message the attending Carole Hall,DO to see if Pt could get her home dose of Requip ordered. Last dose taken was yesterday morning per Pt.

## 2023-06-30 NOTE — ED Notes (Signed)
 Pt O2 stat 85 RN made aware pt is lying on her side I went to check on pt she is ok and resting

## 2023-06-30 NOTE — ED Notes (Signed)
 Gave pt a cup of orange juice

## 2023-06-30 NOTE — ED Notes (Signed)
 Pt daughter came out and asked for pt to be changed since she had not been since "last night". Pt noted to have a wet pull-up and wet sheets. Pt was assisted to a standing position and then to a chair. She had no complaints on same. Pt bed sheets changed and she was assisted back to the bed and was placed back on the cardiac monitor.

## 2023-07-01 DIAGNOSIS — L03119 Cellulitis of unspecified part of limb: Secondary | ICD-10-CM | POA: Diagnosis not present

## 2023-07-01 LAB — GLUCOSE, CAPILLARY
Glucose-Capillary: 109 mg/dL — ABNORMAL HIGH (ref 70–99)
Glucose-Capillary: 105 mg/dL — ABNORMAL HIGH (ref 70–99)
Glucose-Capillary: 117 mg/dL — ABNORMAL HIGH (ref 70–99)
Glucose-Capillary: 125 mg/dL — ABNORMAL HIGH (ref 70–99)

## 2023-07-01 LAB — HEMOGLOBIN A1C
Hgb A1c MFr Bld: 5.2 % (ref 4.8–5.6)
Mean Plasma Glucose: 102.54 mg/dL

## 2023-07-01 MED ORDER — ROPINIROLE HCL 0.25 MG PO TABS
0.2500 mg | ORAL_TABLET | Freq: Three times a day (TID) | ORAL | Status: DC | PRN
  Administered 2023-07-01: 0.25 mg via ORAL
  Filled 2023-07-01: qty 1

## 2023-07-01 NOTE — Progress Notes (Signed)
 PROGRESS NOTE  Danielle Thomas  ZOX:[id number] DOB: 1927/07/19 DOA: 06/29/2023 PCP: Gweneth Dimitri, MD   Brief Narrative: Patient is a 89 year old female with history of history of impaired hearing presented with pain /wound of the left dorsal foot.  Patient was recently started on antibiotics for cellulitis of the left foot.  On presentation, she was hemodynamically stable.  Left foot x-ray showed marked dorsal soft tissue swelling of forefoot and midfoot, no signs of osteomyelitis.  Also showed acute nondisplaced intra-articular fracture of the medial aspect of the base of first proximal phalanx of left foot.  Cellulitis significantly better with antibiotic.  Pending PT/OT evaluation  Assessment & Plan:  Principal Problem:   Cellulitis of lower extremity Active Problems:   Atrial fibrillation (HCC)   Essential hypertension   Hyperlipidemia   Chronic combined systolic (congestive) and diastolic (congestive) heart failure (HCC)   Lactic acidosis   Hyperglycemia  Cellulitis of left lower extremity: Continue ceftriaxone.  X-ray showed soft tissue swelling, no evidence of osteomyelitis.  Follow-up cultures. Xray also shows acute nondisplaced intra-articular fracture at the medial aspect of the base of the first proximal phalanx of the left foot. PT /OT consulted.  Edema, area of cellulitis significantly improving.  Paroxysmal A-fib: Currently on Eliquis.  Monitor on telemetry.  Hyperglycemia: On Jardiance.  A1c of 5.2..  Currently on sliding scale  Hypertension: On losartan  Hyperlipidemia: Not on medical therapy  Combined systolic/diastolic CHF: On ARB and diuretic therapy at home.  Continue with the same  Debility/deconditioning: PT/OT consulted.  Lives with her son.  Ambulates with the help of walker.       DVT prophylaxis:apixaban (ELIQUIS) tablet 2.5 mg Start: 06/30/23 0645 apixaban (ELIQUIS) tablet 2.5 mg     Code Status: Limited: Do not attempt resuscitation (DNR)  -DNR-LIMITED -Do Not Intubate/DNI   Family Communication: None at the bedside  Patient status: Inpatient  Patient is from : Home  Anticipated discharge to: Home  Estimated DC date: Tomorrow   Consultants: None  Procedures: None  Antimicrobials:  Anti-infectives (From admission, onward)    Start     Dose/Rate Route Frequency Ordered Stop   06/29/23 2330  cefTRIAXone (ROCEPHIN) 2 g in sodium chloride 0.9 % 100 mL IVPB        2 g 200 mL/hr over 30 Minutes Intravenous Every 24 hours 06/29/23 2316     06/29/23 2315  cefTRIAXone (ROCEPHIN) 2 g in sodium chloride 0.9 % 100 mL IVPB  Status:  Discontinued        2 g 200 mL/hr over 30 Minutes Intravenous  Once 06/29/23 2303 06/29/23 2316       Subjective: Patient seen and examined at bedside today.  She looked comfortable.  Alert and oriented.  Bilateral lower extremity cellulitis area looking better with complete resolution of edema.  Denies any significant pain  Objective: Vitals:   06/30/23 1414 06/30/23 2050 07/01/23 0159 07/01/23 0520  BP: (!) 157/83 (!) 157/82 109/63 115/70  Pulse: (!) 43 (!) 101 73 86  Resp: 17 20 20 20   Temp: (!) 97.3 F (36.3 C) 97.9 F (36.6 C) 97.9 F (36.6 C) (!) 97.3 F (36.3 C)  TempSrc: Oral Oral Oral Oral  SpO2: 99% 97% 93% 93%  Weight:      Height:       No intake or output data in the 24 hours ending 07/01/23 0743 Filed Weights   06/29/23 2102 06/30/23 1413  Weight: 55.7 kg 54.2 kg    Examination:  General exam: Overall comfortable, not in distress, pleasant elderly female HEENT: PERRL Respiratory system:  no wheezes or crackles  Cardiovascular system: S1 & S2 heard, RRR.  Gastrointestinal system: Abdomen is nondistended, soft and nontender. Central nervous system: Alert and oriented Extremities: No edema, no clubbing ,no cyanosis, erythematous changes on bilateral lower extremities, shallow ulcers on dorsum of left foot Skin: No rashes,no icterus     Data Reviewed: I have  personally reviewed following labs and imaging studies  CBC: Recent Labs  Lab 06/29/23 2115 06/30/23 1216  WBC 7.0 7.5  NEUTROABS 5.1  --   HGB 13.8 12.3  HCT 46.3* 40.4  MCV 92.2 91.0  PLT 217 208   Basic Metabolic Panel: Recent Labs  Lab 06/29/23 2115 06/30/23 1216  NA 140 141  K 5.0 3.8  CL 102 103  CO2 30 29  GLUCOSE 118* 159*  BUN 31* 28*  CREATININE 0.97 0.74  CALCIUM 9.6 8.9     Recent Results (from the past 240 hours)  Blood culture (routine x 2)     Status: None (Preliminary result)   Collection Time: 06/29/23 12:18 AM   Specimen: BLOOD  Result Value Ref Range Status   Specimen Description   Final    BLOOD LEFT ANTECUBITAL Performed at [facility], [address], [place], [place] [address]    Special Requests   Final    BOTTLES DRAWN AEROBIC AND ANAEROBIC Blood Culture adequate volume Performed at [facility], [address] [place], [place] [address]    Culture   Final    NO GROWTH < 12 HOURS Performed at [facility], [address] [place], [place] [address]    Report Status PENDING  Incomplete  Blood culture (routine x 2)     Status: None (Preliminary result)   Collection Time: 06/29/23 11:20 AM   Specimen: BLOOD  Result Value Ref Range Status   Specimen Description   Final    BLOOD LEFT ANTECUBITAL Performed at [facility], [address], [place], [place] [address]    Special Requests   Final    BOTTLES DRAWN AEROBIC ONLY Blood Culture results may not be optimal due to an inadequate volume of blood received in culture bottles Performed at [facility], [address] [place], [place] [address]    Culture   Final    NO GROWTH < 12 HOURS Performed at [facility], [address], [place], [place] [address]    Report Status PENDING  Incomplete     Radiology Studies: DG Foot Complete Left Result Date: 06/29/2023 CLINICAL DATA:  Left foot  infection, dorsal soft tissue wound EXAM: LEFT FOOT - COMPLETE 3+ VIEW COMPARISON:  None Available. FINDINGS: Frontal, oblique, and lateral views of the left foot are obtained. There is marked dorsal soft tissue swelling of the left forefoot and midfoot. No evidence of bony destruction or periosteal reaction to suggest osteomyelitis. There is an acute nondisplaced intra-articular fracture at the medial aspect of the base of the first proximal phalanx. Evidence of prior healed fifth proximal phalangeal fracture. Mild osteoarthritis throughout the midfoot, most pronounced at the talonavicular joint. Small inferior calcaneal spur. IMPRESSION: 1. Marked dorsal soft tissue swelling of the forefoot and midfoot. No bony destruction or periosteal reaction to suggest osteomyelitis. 2. Acute nondisplaced intra-articular fracture at the medial aspect of the base of the first proximal phalanx. 3. Midfoot osteoarthritis. 4. Inferior calcaneal spur. Electronically Signed  By: Sharlet Salina M.D.   On: 06/29/2023 21:58    Scheduled Meds:  apixaban  2.5 mg Oral BID   dorzolamide  1 drop Both Eyes BID   empagliflozin  10 mg Oral Daily   furosemide  40 mg Oral Daily   gabapentin  100 mg Oral QHS   insulin aspart  0-9 Units Subcutaneous TID WC   latanoprost  1 drop Both Eyes QHS   losartan  50 mg Oral Daily   potassium chloride SA  20 mEq Oral Daily   Continuous Infusions:  cefTRIAXone (ROCEPHIN)  IV 2 g (07/01/23 0052)     LOS: 2 days   Burnadette Pop, MD [facility] P2/23/2025, 7:43 AM

## 2023-07-01 NOTE — Plan of Care (Signed)

## 2023-07-01 NOTE — Consult Note (Signed)
 WOC Nurse Consult Note: Reason for Consult: BLE Patient with history of right hip surgery. Has RLE wounds lateral and medially(with hydroferra blue in place in images), she also has a wound on the the left dorsal foot. This is the wound she was sent to the ED for. Xrays are negative  Wound type: Full thickness ulcerations RLE medially and pretibial in the gaiter region of the LE. Full thickness ulceration of the left dorsal foot Pressure Injury POA: NA Measurement:see nursing flow sheets Wound bed: Right medial darkened, ruddy Right pretibial; pink clean Left dorsal foot; black with erythema and distal darkening of the tissues laterally Drainage (amount, consistency, odor) see nursing flow sheets Periwound: notable difference in the size of the LLE to the RLE, may have been in compression. I can not see any notes that she is followed outpatient in a [facility]  Dressing procedure/placement/frequency: Cleanse each wound with Vashe [place] # [phone]), pat dry. Cover RLE wounds with silver hydrofiber [place] [phone]) Aquacel Ag+, top with foam. Change every other day  Cover left dorsal foot wound with single layer of xeroform, top with foam.  Change every other day  Consider vascular or podiatry consult inpatient, unclear etiology  Re consult if needed, will not follow at this time. Thanks  Donald Jacque [employer], RN,CWOCN, CNS, CWON-AP [phone])

## 2023-07-02 ENCOUNTER — Other Ambulatory Visit (HOSPITAL_COMMUNITY): Payer: Self-pay

## 2023-07-02 DIAGNOSIS — L03119 Cellulitis of unspecified part of limb: Secondary | ICD-10-CM | POA: Diagnosis not present

## 2023-07-02 LAB — GLUCOSE, CAPILLARY
Glucose-Capillary: 91 mg/dL (ref 70–99)
Glucose-Capillary: 118 mg/dL — ABNORMAL HIGH (ref 70–99)

## 2023-07-02 MED ORDER — AMOXICILLIN-POT CLAVULANATE 875-125 MG PO TABS
1.0000 | ORAL_TABLET | Freq: Two times a day (BID) | ORAL | 0 refills | Status: AC
  Filled 2023-07-02: qty 10, 5d supply, fill #0

## 2023-07-02 MED ORDER — DOXYCYCLINE HYCLATE 100 MG PO TABS
100.0000 mg | ORAL_TABLET | Freq: Two times a day (BID) | ORAL | Status: DC
  Administered 2023-07-02: 100 mg via ORAL
  Filled 2023-07-02: qty 1

## 2023-07-02 MED ORDER — DOXYCYCLINE HYCLATE 100 MG PO TABS
100.0000 mg | ORAL_TABLET | Freq: Two times a day (BID) | ORAL | 0 refills | Status: AC
  Filled 2023-07-02: qty 10, 5d supply, fill #0

## 2023-07-02 MED ORDER — TRAMADOL HCL 50 MG PO TABS
50.0000 mg | ORAL_TABLET | Freq: Two times a day (BID) | ORAL | 0 refills | Status: AC | PRN
  Filled 2023-07-02: qty 15, 8d supply, fill #0

## 2023-07-02 MED ORDER — AMOXICILLIN-POT CLAVULANATE 875-125 MG PO TABS
1.0000 | ORAL_TABLET | Freq: Two times a day (BID) | ORAL | Status: DC
  Administered 2023-07-02: 1 via ORAL
  Filled 2023-07-02: qty 1

## 2023-07-02 NOTE — Progress Notes (Signed)
 TOC meds in a secure bag delivered to pt in room by this RN.

## 2023-07-02 NOTE — Evaluation (Signed)
 Occupational Therapy Evaluation Patient Details Name: Danielle Thomas MRN: [id number] DOB: 05-19-1927 Today's Date: 07/02/2023   History of Present Illness   Pt is a 89 y.o. female with history of history of impaired hearing presented with pain /wound of the left dorsal foot.  Pt was recently started on antibiotics for L foot cellulitis.  L foot x-ray showed marked dorsal soft tissue swelling of forefoot and midfoot, no signs of osteomyelitis.  Also showed acute nondisplaced intra-articular fracture of the medial aspect of the base of first proximal phalanx of L foot.  Cellulitis significantly better with antibiotic.       Clinical Impressions Pt present with new L post op shoe in place with WBAT L LE, no reports of pain. Pt HOH with s/s of anxiety with mobility. Pt also presents with decreased activity tolerance, balance deficits in standing impacting amb, mobility and ADL independence. Pt reports she lives with son who provides assistance for navigating steps to enter using w/c and has a HHA 3x per week for support. OT recommending HHOT services, 24 hr family S and support upon d/c. OT will continue to follow in Acute as needed.      If plan is discharge home, recommend the following:   A little help with walking and/or transfers;A little help with bathing/dressing/bathroom;Assistance with cooking/housework;Direct supervision/assist for medications management;Assist for transportation;Help with stairs or ramp for entrance;Supervision due to cognitive status     Functional Status Assessment   Patient has had a recent decline in their functional status and demonstrates the ability to make significant improvements in function in a reasonable and predictable amount of time.     Equipment Recommendations   None recommended by OT      Precautions/Restrictions   Restrictions Weight Bearing Restrictions Per Provider Order: No Other Position/Activity Restrictions: WBAT L ortho shoe      Mobility Bed Mobility Overal bed mobility: Needs Assistance Bed Mobility: Rolling, Supine to Sit Rolling: Supervision   Supine to sit: Supervision          Transfers Overall transfer level: Needs assistance Equipment used: Rolling walker (2 wheels) Transfers: Sit to/from Stand, Bed to chair/wheelchair/BSC Sit to Stand: Contact guard assist     Step pivot transfers: Contact guard assist     General transfer comment: cues for hand placement and pacing with breathing strategies as pt reports and demo s/s of anxiety with mobility      Balance Overall balance assessment: Needs assistance Sitting-balance support: No upper extremity supported Sitting balance-Leahy Scale: Good     Standing balance support: Bilateral upper extremity supported Standing balance-Leahy Scale: Fair Standing balance comment: forward head and kyphotoc standing posture                           ADL either performed or assessed with clinical judgement   ADL Overall ADL's : Needs assistance/impaired Eating/Feeding: Independent   Grooming: Set up Grooming Details (indicate cue type and reason): seated Upper Body Bathing: Set up Upper Body Bathing Details (indicate cue type and reason): seated Lower Body Bathing: Minimal assistance Lower Body Bathing Details (indicate cue type and reason): seated with supported standing Upper Body Dressing : Set up;Sitting   Lower Body Dressing: Minimal assistance;Cueing for safety Lower Body Dressing Details (indicate cue type and reason): cues and assist for new ortho shoe on L Toilet Transfer: Contact guard assist   Toileting- Clothing Manipulation and Hygiene: Contact guard assist     Tub/Shower  Transfer Details (indicate cue type and reason): pt sponge bathes at baseline Functional mobility during ADLs: Contact guard assist;Cueing for safety General ADL Comments: pt presented with s/s of anxiety during mobility with report of fear of falling      Vision Baseline Vision/History: 1 Wears glasses Ability to See in Adequate Light: 0 Adequate Patient Visual Report: No change from baseline Vision Assessment?: No apparent visual deficits     Perception Perception: Within Functional Limits       Praxis Praxis: WFL       Pertinent Vitals/Pain Pain Assessment Pain Assessment: No/denies pain     Extremity/Trunk Assessment Upper Extremity Assessment Upper Extremity Assessment: Overall WFL for tasks assessed   Lower Extremity Assessment Lower Extremity Assessment: Defer to PT evaluation   Cervical / Trunk Assessment Cervical / Trunk Assessment: Kyphotic   Communication Communication Communication: No apparent difficulties Factors Affecting Communication: Hearing impaired   Cognition Arousal: Alert Behavior During Therapy: Anxious Cognition: No apparent impairments, No family/caregiver present to determine baseline             OT - Cognition Comments: s/s of anxiety and HOH may limit safety and sequencing                 Following commands: Intact       Cueing  General Comments   Cueing Techniques: Verbal cues;Tactile cues      Exercises     Shoulder Instructions      Home Living Family/patient expects to be discharged to:: Private residence Living Arrangements: Children;Other relatives Available Help at Discharge: Family;Available 24 hours/day Type of Home: House Home Access: Stairs to enter [employer] of Steps: 2   Home Layout: Two level;Able to live on main level with bedroom/bathroom;1/2 bath on main level Alternate Level Stairs-Number of Steps: 13 Alternate Level Stairs-Rails: Right Bathroom Shower/Tub: Chief Strategy Officer: Standard Bathroom Accessibility: Yes How Accessible: Accessible via walker Home Equipment: Rollator (4 wheels);Cane - single Librarian, academic (2 wheels);Shower seat;Hand held shower head;BSC/3in1;Toilet riser;Wheelchair - manual    Additional Comments: bewd rail, HHA 3 days per weel      Prior Functioning/Environment Prior Level of Function : Needs assist  Cognitive Assist : Mobility (cognitive);ADLs (cognitive) Mobility (Cognitive): Intermittent cues ADLs (Cognitive): Intermittent cues Physical Assist : Mobility (physical);ADLs (physical) Mobility (physical): Transfers;Gait;Stairs ADLs (physical): Grooming;Bathing;Dressing;Toileting;IADLs Mobility Comments: Family assists in w/c on stairs to enter, pt 1st fl set up with family and HHA assist ADLs Comments: Family and HHA assist with self care    OT Problem List: Decreased strength;Decreased activity tolerance;Impaired balance (sitting and/or standing);Decreased safety awareness;Decreased knowledge of use of DME or AE   OT Treatment/Interventions: Self-care/ADL training;Therapeutic exercise;Energy conservation;DME and/or AE instruction;Therapeutic activities;Patient/family education;Balance training      OT Goals(Current goals can be found in the care plan section)   Acute Rehab OT Goals Patient Stated Goal: to go home with son OT Goal Formulation: With patient Time For Goal Achievement: 07/16/23 Potential to Achieve Goals: Good ADL Goals Pt Will Perform Lower Body Dressing: with supervision;sit to/from stand Pt Will Transfer to Toilet: with supervision;grab bars   OT Frequency:  Min 1X/week    Co-evaluation              AM-PAC OT "6 Clicks" Daily Activity     Outcome Measure Help from another person eating meals?: None Help from another person taking care of personal grooming?: A Little Help from another person toileting, which includes using toliet, bedpan, or urinal?:  A Little Help from another person bathing (including washing, rinsing, drying)?: A Little Help from another person to put on and taking off regular upper body clothing?: A Little Help from another person to put on and taking off regular lower body clothing?: A Little 6 Click  Score: 19   End of Session Equipment Utilized During Treatment: Gait belt;Rolling walker (2 wheels) Nurse Communication: Mobility status;Weight bearing status  Activity Tolerance: Patient limited by fatigue Patient left: in chair;with call bell/phone within reach  OT Visit Diagnosis: Unsteadiness on feet (R26.81);Other abnormalities of gait and mobility (R26.89);Muscle weakness (generalized) (M62.81)                Time: 5732-2025 OT Time Calculation (min): 22 min Charges:  OT General Charges $OT Visit: 1 Visit OT Evaluation $OT Eval Low Complexity: 1 Low  Develle Sievers OT/L Acute Rehabilitation Department  [phone] 07/02/2023, 1:02 PM

## 2023-07-02 NOTE — Progress Notes (Signed)
 Orthopedic Tech Progress Note Patient Details:  RHETTA CLEEK 11-13-1927 [id number]  Ortho Devices Type of Ortho Device: Postop shoe/boot Ortho Device/Splint Location: LLE Ortho Device/Splint Interventions: Ordered, Application, Adjustment   Post Interventions Patient Tolerated: Well Instructions Provided: Care of device, Adjustment of device  [place] A Izell Labat 07/02/2023, 11:10 AM

## 2023-07-02 NOTE — Evaluation (Signed)
 Physical Therapy Evaluation Patient Details Name: Danielle Thomas MRN: [id number] DOB: April 12, 1928 Today's Date: 07/02/2023  History of Present Illness  Pt is 89 yo female presented on 06/29/23 with cellulitis L LE.  Pt also found to have acute nondisplaced intra-articular fracture of the medial aspect of the base of first proximal phalanx of left foot.  Pt with hx including but not limited to CHF, hearing impaired, HTN, IM N R hip 12/24  Clinical Impression  Pt admitted with above diagnosis. At baseline, pt resides at home and has 24 hr care.  Pt has necessary DME.  Today, pt anxious with movement but did stand with CGA and ambulated 7' with RW and CGA.  Required cues for relaxation and breathing.  Tolerated weight on L LE well with post op shoe in place.   Pt currently with functional limitations due to the deficits listed below (see PT Problem List). Pt will benefit from acute skilled PT to increase their independence and safety with mobility to allow discharge.  With good home support recommend return home with 24 hr care and ongoing HHPT/OT.          If plan is discharge home, recommend the following: A little help with walking and/or transfers;A little help with bathing/dressing/bathroom;Assistance with cooking/housework;Help with stairs or ramp for entrance   Can travel by private vehicle        Equipment Recommendations None recommended by PT  Recommendations for Other Services       Functional Status Assessment Patient has had a recent decline in their functional status and demonstrates the ability to make significant improvements in function in a reasonable and predictable amount of time.     Precautions / Restrictions Precautions Precautions: Fall Required Braces or Orthoses: Other Brace Other Brace: post op shoe Restrictions Other Position/Activity Restrictions: WBAT post op shoe      Mobility  Bed Mobility Overal bed mobility: Needs Assistance Bed Mobility: Rolling,  Supine to Sit Rolling: Supervision   Supine to sit: Contact guard          Transfers Overall transfer level: Needs assistance Equipment used: Rolling walker (2 wheels) Transfers: Sit to/from Stand Sit to Stand: Contact guard assist           General transfer comment: Pt grunting loudly with standing; expressed anxiety; cues for hand placement and breathing    Ambulation/Gait Ambulation/Gait assistance: Contact guard assist Gait Distance (Feet): 7 Feet Assistive device: Rolling walker (2 wheels) Gait Pattern/deviations: Step-to pattern, Trunk flexed, Shuffle, Decreased stride length Gait velocity: decreased     General Gait Details: Very anxious, fatigued easily, counting with each step requiring cues for breathing, does not report pain  Stairs            Wheelchair Mobility     Tilt Bed    Modified Rankin (Stroke Patients Only)       Balance Overall balance assessment: Needs assistance Sitting-balance support: No upper extremity supported Sitting balance-Leahy Scale: Good     Standing balance support: Bilateral upper extremity supported, Reliant on assistive device for balance Standing balance-Leahy Scale: Poor Standing balance comment: RW and CGA; forward head and kyphotic                             Pertinent Vitals/Pain Pain Assessment Pain Assessment: No/denies pain    Home Living Family/patient expects to be discharged to:: Private residence Living Arrangements: Children;Other relatives Available Help at Discharge: Family;Available 24  hours/day;Personal care attendant Type of Home: House Home Access: Stairs to enter   Entrance Stairs-Number of Steps: 2 Alternate Level Stairs-Number of Steps: 13 Home Layout: Two level;Able to live on main level with bedroom/bathroom;1/2 bath on main level Home Equipment: Rollator (4 wheels);Cane - single Librarian, academic (2 wheels);Shower seat;Hand held shower head;BSC/3in1;Toilet  riser;Wheelchair - manual Additional Comments: bed rail, HHA 3 days per week    Prior Function Prior Level of Function : Needs assist  Cognitive Assist : Mobility (cognitive);ADLs (cognitive) Mobility (Cognitive): Intermittent cues ADLs (Cognitive): Intermittent cues Physical Assist : Mobility (physical);ADLs (physical) Mobility (physical): Transfers;Gait;Stairs ADLs (physical): Grooming;Bathing;Dressing;Toileting;IADLs Mobility Comments: Family assists in w/c on stairs to enter, Pt able to stay on first floor, reports ambulates in home with RW and assist; was getting HHPT/OT ADLs Comments: Family and HHA assist with self care     Extremity/Trunk Assessment   Upper Extremity Assessment Upper Extremity Assessment: Defer to OT evaluation    Lower Extremity Assessment Lower Extremity Assessment: LLE deficits/detail;RLE deficits/detail RLE Deficits / Details: ROM WFL; MMT: 4/5 (gentle testing due to wounds/skin integrity/pain) LLE Deficits / Details: ROM WFL; MMT: 4/5 (gentle testing due to wounds/skin integrity/pain) and DF at least 3/5 but not further tested    Cervical / Trunk Assessment Cervical / Trunk Assessment: Kyphotic  Communication   Communication Communication: No apparent difficulties Factors Affecting Communication: Hearing impaired    Cognition Arousal: Alert Behavior During Therapy: Anxious   PT - Cognitive impairments: No family/caregiver present to determine baseline, No apparent impairments                       PT - Cognition Comments: occsioanlly increased time and answers vary but may be due to [facility] Following commands: Intact       Cueing Cueing Techniques: Verbal cues, Tactile cues     General Comments General comments (skin integrity, edema, etc.): VSS on RA (sats 90% after ambulation)    Exercises     Assessment/Plan    PT Assessment Patient needs continued PT services  PT Problem List Decreased strength;Decreased range of  motion;Decreased activity tolerance;Decreased balance;Decreased mobility;Decreased knowledge of use of DME;Cardiopulmonary status limiting activity       PT Treatment Interventions DME instruction;Therapeutic exercise;Gait training;Functional mobility training;Therapeutic activities;Patient/family education;Balance training;Wheelchair mobility training;Neuromuscular re-education;Modalities    PT Goals (Current goals can be found in the Care Plan section)  Acute Rehab PT Goals Patient Stated Goal: return home PT Goal Formulation: With patient Time For Goal Achievement: 07/16/23 Potential to Achieve Goals: Good    Frequency Min 1X/week     Co-evaluation               AM-PAC PT "6 Clicks" Mobility  Outcome Measure Help needed turning from your back to your side while in a flat bed without using bedrails?: A Little Help needed moving from lying on your back to sitting on the side of a flat bed without using bedrails?: A Little Help needed moving to and from a bed to a chair (including a wheelchair)?: A Little Help needed standing up from a chair using your arms (e.g., wheelchair or bedside chair)?: A Little Help needed to walk in hospital room?: Total (<20') Help needed climbing 3-5 steps with a railing? : Total 6 Click Score: 14    End of Session Equipment Utilized During Treatment: Gait belt;Other (comment) (post op shoe) Activity Tolerance: Patient limited by fatigue Patient left: with call bell/phone within reach;in chair;with chair alarm set  Nurse Communication: Mobility status PT Visit Diagnosis: Other abnormalities of gait and mobility (R26.89);Muscle weakness (generalized) (M62.81)    Time: 1610-9604 PT Time Calculation (min) (ACUTE ONLY): 16 min   Charges:   PT Evaluation $PT Eval Low Complexity: 1 Low   PT General Charges $$ ACUTE PT VISIT: 1 Visit         Anise Salvo, PT Acute Rehab Services [facility] [phone]   Rayetta Humphrey 07/02/2023, 1:30  PM

## 2023-07-02 NOTE — Discharge Summary (Signed)
 Physician Discharge Summary  Danielle Thomas ZOX:[id number] DOB: 09/17/1927 DOA: 06/29/2023  PCP: Gweneth Dimitri, MD  Admit date: 06/29/2023 Discharge date: 07/02/2023  Admitted From: Home Disposition:  Home  Discharge Condition:Stable CODE STATUS:DNR Diet recommendation: Heart Healthy  Brief/Interim Summary: Patient is a 89 year old female with history of history of impaired hearing presented with pain /wound of the left dorsal foot.  Patient was recently started on antibiotics for cellulitis of the left foot.  On presentation, she was hemodynamically stable.  Left foot x-ray showed marked dorsal soft tissue swelling of forefoot and midfoot, no signs of osteomyelitis.  Also showed acute nondisplaced intra-articular fracture of the medial aspect of the base of first proximal phalanx of left foot.  Cellulitis significantly better with antibiotic.  PT/OT evaluation done, recommended home.  Medically stable for discharge home today  Following problems were addressed during the hospitalization:  Cellulitis of left lower extremity: .  X-ray showed soft tissue swelling, no evidence of osteomyelitis.  Cultures:NGTD Xray also showed acute nondisplaced intra-articular fracture at the medial aspect of the base of the first proximal phalanx of the left foot. PT /OT consulted, recommend home health.  Edema, area of cellulitis significantly improving.  Antibiotics in total.  Application of postop shoe/boot on the left lower extremity   Paroxysmal A-fib: Currently on Eliquis.  Rate controlled   Hyperglycemia: On Jardiance.  A1c of 5.2.   Hypertension: On losartan   Hyperlipidemia: Not on medical therapy   Combined systolic/diastolic CHF: On ARB and diuretic therapy at home.  Continue with the same   Debility/deconditioning: PT/OT consulted.  Lives with her son.  Ambulates with the help of walker.  Home health ordered   Discharge Diagnoses:  Principal Problem:   Cellulitis of lower extremity Active  Problems:   Atrial fibrillation (HCC)   Essential hypertension   Hyperlipidemia   Chronic combined systolic (congestive) and diastolic (congestive) heart failure (HCC)   Lactic acidosis   Hyperglycemia    Discharge Instructions  Discharge Instructions     Diet - low sodium heart healthy   Complete by: As directed    Discharge instructions   Complete by: As directed    1)Please take your medications as instructed 2)Follow up with your PCP in a week   Discharge wound care:   Complete by: As directed    As per wound care nurse   Increase activity slowly   Complete by: As directed       Allergies as of 07/02/2023       Reactions   Amlodipine Besylate    Other reaction(s): high dose make feet swell   Codeine Nausea And Vomiting   Meperidine Nausea Only   Metoprolol Succinate [metoprolol]    Other reaction(s): effects her breathing   Oxycodone Nausea And Vomiting        Medication List     STOP taking these medications    HYDROcodone-acetaminophen 5-325 MG tablet Commonly known as: NORCO/VICODIN       TAKE these medications    acetaminophen 325 MG tablet Commonly known as: TYLENOL Take 650 mg by mouth every 6 (six) hours as needed for mild pain (pain score 1-3).   amoxicillin-clavulanate 875-125 MG tablet Commonly known as: AUGMENTIN Take 1 tablet by mouth every 12 (twelve) hours for 5 days.   apixaban 2.5 MG Tabs tablet Commonly known as: ELIQUIS Take 1 tablet (2.5 mg total) by mouth 2 (two) times daily.   dorzolamide 2 % ophthalmic solution Commonly known as: TRUSOPT Place  1 drop into both eyes 2 (two) times daily.   dorzolamide-timolol 2-0.5 % ophthalmic solution Commonly known as: COSOPT Place 1 drop into both eyes 2 (two) times daily.   doxycycline 100 MG tablet Commonly known as: VIBRA-TABS Take 1 tablet (100 mg total) by mouth every 12 (twelve) hours for 5 days.   empagliflozin 10 MG Tabs tablet Commonly known as: JARDIANCE Take 1  tablet (10 mg total) by mouth daily.   furosemide 20 MG tablet Commonly known as: LASIX Take 1 tablet (20 mg total) by mouth daily. Take tablet (20 mg) by mouth daily. May take extra tablet by mouth if weight gain of 3 lbs overnight, 5 lbs in a week, and/or swelling of lower extremities What changed:  how much to take additional instructions   gabapentin 100 MG capsule Commonly known as: NEURONTIN Take 100 mg by mouth at bedtime.   latanoprost 0.005 % ophthalmic solution Commonly known as: XALATAN Place 1 drop into both eyes at bedtime.   losartan 50 MG tablet Commonly known as: COZAAR TAKE 1 TABLET EVERY DAY (NEW DOSE)   methocarbamol 500 MG tablet Commonly known as: ROBAXIN 750 mg at bedtime.   polyethylene glycol 17 g packet Commonly known as: MIRALAX / GLYCOLAX Take 17 g by mouth 2 (two) times daily.   potassium chloride SA 20 MEQ tablet Commonly known as: KLOR-CON M TAKE 1 TABLET EVERY DAY   rOPINIRole 0.25 MG tablet Commonly known as: REQUIP Take 1 tablet (0.25 mg total) by mouth daily as needed (restless legs around 10am - home dose). What changed:  when to take this reasons to take this additional instructions   traMADol 50 MG tablet Commonly known as: ULTRAM Take 1 tablet (50 mg total) by mouth every 12 (twelve) hours as needed for moderate pain (pain score 4-6) or severe pain (pain score 7-10). What changed: how much to take               Discharge Care Instructions  (From admission, onward)           Start     Ordered   07/02/23 0000  Discharge wound care:       Comments: As per wound care nurse   07/02/23 1247            Follow-up Information     Gweneth Dimitri, MD. Schedule an appointment as soon as possible for a visit in 1 week(s).   Specialty: Family Medicine Contact information: [address] [place] [address] [phone]                Allergies  Allergen Reactions   Amlodipine Besylate     Other  reaction(s): high dose make feet swell   Codeine Nausea And Vomiting   Meperidine Nausea Only   Metoprolol Succinate [Metoprolol]     Other reaction(s): effects her breathing   Oxycodone Nausea And Vomiting    Consultations: None   Procedures/Studies: DG Foot Complete Left Result Date: 06/29/2023 CLINICAL DATA:  Left foot infection, dorsal soft tissue wound EXAM: LEFT FOOT - COMPLETE 3+ VIEW COMPARISON:  None Available. FINDINGS: Frontal, oblique, and lateral views of the left foot are obtained. There is marked dorsal soft tissue swelling of the left forefoot and midfoot. No evidence of bony destruction or periosteal reaction to suggest osteomyelitis. There is an acute nondisplaced intra-articular fracture at the medial aspect of the base of the first proximal phalanx. Evidence of prior healed fifth proximal phalangeal fracture. Mild osteoarthritis  throughout the midfoot, most pronounced at the talonavicular joint. Small inferior calcaneal spur. IMPRESSION: 1. Marked dorsal soft tissue swelling of the forefoot and midfoot. No bony destruction or periosteal reaction to suggest osteomyelitis. 2. Acute nondisplaced intra-articular fracture at the medial aspect of the base of the first proximal phalanx. 3. Midfoot osteoarthritis. 4. Inferior calcaneal spur. Electronically Signed   By: Sharlet Salina M.D.   On: 06/29/2023 21:58      Subjective: Patient seen and examined at bedside today.  Hemodynamically stable.  Comfortable.  Bilateral lower extremity cellulitis area looking much better today.  Medically stable for discharge.  Discharge plan discussed with son at bedside  Discharge Exam: Vitals:   07/02/23 0606 07/02/23 1154  BP: (!) 145/75 133/75  Pulse: 80 85  Resp:    Temp: 98.1 F (36.7 C) 98 F (36.7 C)  SpO2: 98% 97%   Vitals:   07/01/23 1421 07/01/23 1949 07/02/23 0606 07/02/23 1154  BP: 124/63 (!) 144/88 (!) 145/75 133/75  Pulse: 80 80 80 85  Resp:  16    Temp: 97.9 F (36.6  C) 98.2 F (36.8 C) 98.1 F (36.7 C) 98 F (36.7 C)  TempSrc: Oral     SpO2: 99% 96% 98% 97%  Weight:      Height:        General: Pt is alert, awake, not in acute distress, pleasant elderly female Cardiovascular: RRR, S1/S2 +, no rubs, no gallops Respiratory: CTA bilaterally, no wheezing, no rhonchi Abdominal: Soft, NT, ND, bowel sounds + Extremities: Mild erythematous discoloration of bilateral lower extremities with scattered shallow ulcers    The results of significant diagnostics from this hospitalization (including imaging, microbiology, ancillary and laboratory) are listed below for reference.     Microbiology: Recent Results (from the past 240 hours)  Blood culture (routine x 2)     Status: None (Preliminary result)   Collection Time: 06/29/23 12:18 AM   Specimen: BLOOD  Result Value Ref Range Status   Specimen Description   Final    BLOOD LEFT ANTECUBITAL Performed at [facility], [address] [place], [place] [address]    Special Requests   Final    BOTTLES DRAWN AEROBIC AND ANAEROBIC Blood Culture adequate volume Performed at [facility], [address], [place], [place] [address]    Culture   Final    NO GROWTH 2 DAYS Performed at [facility], [address], [place], [place] [address]    Report Status PENDING  Incomplete  Blood culture (routine x 2)     Status: None (Preliminary result)   Collection Time: 06/29/23 11:20 AM   Specimen: BLOOD  Result Value Ref Range Status   Specimen Description   Final    BLOOD LEFT ANTECUBITAL Performed at [facility], [address], [place], [place] [address]    Special Requests   Final    BOTTLES DRAWN AEROBIC ONLY Blood Culture results may not be optimal due to an inadequate volume of blood received in culture bottles Performed at [facility], [address] [place], [place] [address]    Culture   Final    NO GROWTH 2  DAYS Performed at [facility], [address] [place], [place] [address]    Report Status PENDING  Incomplete     Labs: BNP (last 3 results) Recent Labs    07/13/22 1845  BNP 1,998.9*   Basic Metabolic Panel: Recent Labs  Lab 06/29/23 2115 06/30/23 1216  NA 140 141  K 5.0 3.8  CL 102 103  CO2 30 29  GLUCOSE 118* 159*  BUN 31* 28*  CREATININE 0.97 0.74  CALCIUM 9.6 8.9   Liver Function Tests: Recent Labs  Lab 06/29/23 2115 06/30/23 1216  AST 22 18  ALT 11 10  ALKPHOS 115 94  BILITOT 1.2 0.7  PROT 8.2* 7.1  ALBUMIN 3.4* 3.0*   No results for input(s): "LIPASE", "AMYLASE" in the last 168 hours. No results for input(s): "AMMONIA" in the last 168 hours. CBC: Recent Labs  Lab 06/29/23 2115 06/30/23 1216  WBC 7.0 7.5  NEUTROABS 5.1  --   HGB 13.8 12.3  HCT 46.3* 40.4  MCV 92.2 91.0  PLT 217 208   Cardiac Enzymes: No results for input(s): "CKTOTAL", "CKMB", "CKMBINDEX", "TROPONINI" in the last 168 hours. BNP: Invalid input(s): "POCBNP" CBG: Recent Labs  Lab 07/01/23 1155 07/01/23 1659 07/01/23 2048 07/02/23 0742 07/02/23 1151  GLUCAP 105* 117* 125* 91 118*   D-Dimer No results for input(s): "DDIMER" in the last 72 hours. Hgb A1c Recent Labs    07/01/23 0544  HGBA1C 5.2   Lipid Profile No results for input(s): "CHOL", "HDL", "LDLCALC", "TRIG", "CHOLHDL", "LDLDIRECT" in the last 72 hours. Thyroid function studies No results for input(s): "TSH", "T4TOTAL", "T3FREE", "THYROIDAB" in the last 72 hours.  Invalid input(s): "FREET3" Anemia work up No results for input(s): "VITAMINB12", "FOLATE", "FERRITIN", "TIBC", "IRON", "RETICCTPCT" in the last 72 hours. Urinalysis    Component Value Date/Time   COLORURINE AMBER (A) 05/02/2020 1442   APPEARANCEUR TURBID (A) 05/02/2020 1442   LABSPEC 1.023 05/02/2020 1442   PHURINE 5.0 05/02/2020 1442   GLUCOSEU NEGATIVE 05/02/2020 1442   HGBUR LARGE (A) 05/02/2020 1442   BILIRUBINUR NEGATIVE  05/02/2020 1442   KETONESUR NEGATIVE 05/02/2020 1442   PROTEINUR 100 (A) 05/02/2020 1442   NITRITE NEGATIVE 05/02/2020 1442   LEUKOCYTESUR MODERATE (A) 05/02/2020 1442   Sepsis Labs Recent Labs  Lab 06/29/23 2115 06/30/23 1216  WBC 7.0 7.5   Microbiology Recent Results (from the past 240 hours)  Blood culture (routine x 2)     Status: None (Preliminary result)   Collection Time: 06/29/23 12:18 AM   Specimen: BLOOD  Result Value Ref Range Status   Specimen Description   Final    BLOOD LEFT ANTECUBITAL Performed at [facility], [address], [place], [place] [address]    Special Requests   Final    BOTTLES DRAWN AEROBIC AND ANAEROBIC Blood Culture adequate volume Performed at [facility], [address] [place], [place] [address]    Culture   Final    NO GROWTH 2 DAYS Performed at [facility], [address], [place], [place] [address]    Report Status PENDING  Incomplete  Blood culture (routine x 2)     Status: None (Preliminary result)   Collection Time: 06/29/23 11:20 AM   Specimen: BLOOD  Result Value Ref Range Status   Specimen Description   Final    BLOOD LEFT ANTECUBITAL Performed at [facility], [address] [place], [place] [address]    Special Requests   Final    BOTTLES DRAWN AEROBIC ONLY Blood Culture results may not be optimal due to an inadequate volume of blood received in culture bottles Performed at [facility], [address], [place], [place] [address]    Culture   Final  NO GROWTH 2 DAYS Performed at [facility], [address] [place], [place] [address]    Report Status PENDING  Incomplete    Please note: You were cared for by a hospitalist during your hospital stay. Once you are discharged, your primary care physician will handle any further medical issues. Please note that NO REFILLS for any discharge medications will be authorized once you  are discharged, as it is imperative that you return to your primary care physician (or establish a relationship with a primary care physician if you do not have one) for your post hospital discharge needs so that they can reassess your need for medications and monitor your lab values.    Time coordinating discharge: 40 minutes  SIGNED:   Burnadette Pop, MD  [facility] 07/02/2023, 12:49 PM Pager [phone]  If 7PM-7AM, please contact night-coverage [email or web address] Password [email or web address]

## 2023-07-02 NOTE — TOC Transition Note (Signed)
 Transition of Care [facility]) - Discharge Note   Patient Details  Name: Danielle Thomas MRN: [id number] Date of Birth: 1927-10-28  [facility] CM/SW Contact:  Otelia Santee, LCSW Phone Number: 07/02/2023, 2:02 PM   Clinical Narrative:    Pt to return home w/ son at discharge. Pt active with [facility] for home health PT/OT. ROC order in place. Pt also receives 4hr of PCS M-W-F. Pt has wheelchair at home and shares she has a smaller wheelchair that has been ordered for her but, is waiting for it to arrive. No further TOC needs identified.    Final next level of care: Home w Home Health Services Barriers to Discharge: No Barriers Identified   Patient Goals and CMS Choice Patient states their goals for this hospitalization and ongoing recovery are:: To return home CMS Medicare.gov Compare Post Acute Care list provided to:: Patient Choice offered to / list presented to : Patient [place] ownership interest in [facility].provided to::  (NA)    Discharge Placement                       Discharge Plan and Services Additional resources added to the After Visit Summary for                  DME Arranged: N/A DME Agency: NA                  Social Drivers of Health (SDOH) Interventions SDOH Screenings   Food Insecurity: No Food Insecurity (07/01/2023)  Housing: Low Risk  (07/01/2023)  Transportation Needs: No Transportation Needs (07/01/2023)  Utilities: Not At Risk (07/01/2023)  Social Connections: Patient Declined (07/01/2023)  Tobacco Use: Low Risk  (06/29/2023)     Readmission Risk Interventions    07/02/2023    2:02 PM  Readmission Risk Prevention Plan  Transportation Screening Complete  PCP or Specialist Appt within 5-7 Days Complete  Home Care Screening Complete  Medication Review (RN CM) Complete

## 2023-07-02 NOTE — Plan of Care (Signed)
  Problem: Education: Goal: Knowledge of General Education information will improve Description: Including pain rating scale, medication(s)/side effects and non-pharmacologic comfort measures Outcome: Progressing   Problem: Clinical Measurements: Goal: Ability to maintain clinical measurements within normal limits will improve Outcome: Progressing Goal: Respiratory complications will improve Outcome: Progressing   Problem: Nutrition: Goal: Adequate nutrition will be maintained Outcome: Progressing   Problem: Elimination: Goal: Will not experience complications related to urinary retention Outcome: Progressing   Problem: Pain Managment: Goal: General experience of comfort will improve and/or be controlled Outcome: Progressing   Problem: Safety: Goal: Ability to remain free from injury will improve Outcome: Progressing   Problem: Skin Integrity: Goal: Risk for impaired skin integrity will decrease Outcome: Progressing   Problem: Metabolic: Goal: Ability to maintain appropriate glucose levels will improve Outcome: Progressing

## 2023-07-03 DIAGNOSIS — S72141D Displaced intertrochanteric fracture of right femur, subsequent encounter for closed fracture with routine healing: Secondary | ICD-10-CM | POA: Diagnosis not present

## 2023-07-03 DIAGNOSIS — S22000D Wedge compression fracture of unspecified thoracic vertebra, subsequent encounter for fracture with routine healing: Secondary | ICD-10-CM | POA: Diagnosis not present

## 2023-07-03 DIAGNOSIS — D649 Anemia, unspecified: Secondary | ICD-10-CM | POA: Diagnosis not present

## 2023-07-03 DIAGNOSIS — F321 Major depressive disorder, single episode, moderate: Secondary | ICD-10-CM | POA: Diagnosis not present

## 2023-07-03 DIAGNOSIS — G2581 Restless legs syndrome: Secondary | ICD-10-CM | POA: Diagnosis not present

## 2023-07-03 DIAGNOSIS — I5040 Unspecified combined systolic (congestive) and diastolic (congestive) heart failure: Secondary | ICD-10-CM | POA: Diagnosis not present

## 2023-07-03 DIAGNOSIS — I110 Hypertensive heart disease with heart failure: Secondary | ICD-10-CM | POA: Diagnosis not present

## 2023-07-03 DIAGNOSIS — N179 Acute kidney failure, unspecified: Secondary | ICD-10-CM | POA: Diagnosis not present

## 2023-07-03 DIAGNOSIS — I482 Chronic atrial fibrillation, unspecified: Secondary | ICD-10-CM | POA: Diagnosis not present

## 2023-07-05 DIAGNOSIS — S72141D Displaced intertrochanteric fracture of right femur, subsequent encounter for closed fracture with routine healing: Secondary | ICD-10-CM | POA: Diagnosis not present

## 2023-07-05 DIAGNOSIS — N179 Acute kidney failure, unspecified: Secondary | ICD-10-CM | POA: Diagnosis not present

## 2023-07-05 DIAGNOSIS — D649 Anemia, unspecified: Secondary | ICD-10-CM | POA: Diagnosis not present

## 2023-07-05 DIAGNOSIS — G2581 Restless legs syndrome: Secondary | ICD-10-CM | POA: Diagnosis not present

## 2023-07-05 DIAGNOSIS — I5040 Unspecified combined systolic (congestive) and diastolic (congestive) heart failure: Secondary | ICD-10-CM | POA: Diagnosis not present

## 2023-07-05 DIAGNOSIS — F321 Major depressive disorder, single episode, moderate: Secondary | ICD-10-CM | POA: Diagnosis not present

## 2023-07-05 DIAGNOSIS — I482 Chronic atrial fibrillation, unspecified: Secondary | ICD-10-CM | POA: Diagnosis not present

## 2023-07-05 DIAGNOSIS — I110 Hypertensive heart disease with heart failure: Secondary | ICD-10-CM | POA: Diagnosis not present

## 2023-07-05 DIAGNOSIS — S22000D Wedge compression fracture of unspecified thoracic vertebra, subsequent encounter for fracture with routine healing: Secondary | ICD-10-CM | POA: Diagnosis not present

## 2023-07-05 LAB — BLOOD CULTURE ID PANEL (REFLEXED) - BCID2

## 2023-07-05 LAB — CULTURE, BLOOD (ROUTINE X 2): Culture: NO GROWTH

## 2023-07-06 ENCOUNTER — Encounter (HOSPITAL_COMMUNITY): Payer: Self-pay

## 2023-07-06 ENCOUNTER — Other Ambulatory Visit: Payer: Self-pay

## 2023-07-06 ENCOUNTER — Emergency Department (HOSPITAL_COMMUNITY)
Admission: EM | Admit: 2023-07-06 | Discharge: 2023-07-06 | Disposition: A | Discharge status: Home / Self Care | Payer: Medicare HMO | Attending: Emergency Medicine | Admitting: Emergency Medicine

## 2023-07-06 DIAGNOSIS — R112 Nausea with vomiting, unspecified: Secondary | ICD-10-CM | POA: Insufficient documentation

## 2023-07-06 DIAGNOSIS — N939 Abnormal uterine and vaginal bleeding, unspecified: Secondary | ICD-10-CM | POA: Diagnosis not present

## 2023-07-06 DIAGNOSIS — I4891 Unspecified atrial fibrillation: Secondary | ICD-10-CM | POA: Insufficient documentation

## 2023-07-06 DIAGNOSIS — Z7901 Long term (current) use of anticoagulants: Secondary | ICD-10-CM | POA: Insufficient documentation

## 2023-07-06 DIAGNOSIS — R197 Diarrhea, unspecified: Secondary | ICD-10-CM | POA: Diagnosis not present

## 2023-07-06 DIAGNOSIS — R238 Other skin changes: Secondary | ICD-10-CM | POA: Diagnosis not present

## 2023-07-06 LAB — COMPREHENSIVE METABOLIC PANEL
ALT: 12 U/L (ref 0–44)
AST: 19 U/L (ref 15–41)
Albumin: 3.1 g/dL — ABNORMAL LOW (ref 3.5–5.0)
Alkaline Phosphatase: 91 U/L (ref 38–126)
Anion gap: 9 (ref 5–15)
BUN: 36 mg/dL — ABNORMAL HIGH (ref 8–23)
CO2: 28 mmol/L (ref 22–32)
Calcium: 9.2 mg/dL (ref 8.9–10.3)
Chloride: 99 mmol/L (ref 98–111)
Creatinine, Ser: 0.98 mg/dL (ref 0.44–1.00)
GFR, Estimated: 54 mL/min — ABNORMAL LOW (ref >60)
Glucose, Bld: 91 mg/dL (ref 70–99)
Potassium: 4.8 mmol/L (ref 3.5–5.1)
Sodium: 136 mmol/L (ref 135–145)
Total Bilirubin: 0.9 mg/dL (ref 0.0–1.2)
Total Protein: 7.7 g/dL (ref 6.5–8.1)

## 2023-07-06 LAB — CBC WITH DIFFERENTIAL/PLATELET
Abs Immature Granulocytes: 0.02 10*3/uL (ref 0.00–0.07)
Basophils Absolute: 0 10*3/uL (ref 0.0–0.1)
Basophils Relative: 0 %
Eosinophils Absolute: 0 10*3/uL (ref 0.0–0.5)
Eosinophils Relative: 0 %
HCT: 41.5 % (ref 36.0–46.0)
Hemoglobin: 12.8 g/dL (ref 12.0–15.0)
Immature Granulocytes: 0 %
Lymphocytes Relative: 11 %
Lymphs Abs: 0.7 10*3/uL (ref 0.7–4.0)
MCH: 27.9 pg (ref 26.0–34.0)
MCHC: 30.8 g/dL (ref 30.0–36.0)
MCV: 90.6 fL (ref 80.0–100.0)
Monocytes Absolute: 0.6 10*3/uL (ref 0.1–1.0)
Monocytes Relative: 10 %
Neutro Abs: 4.7 10*3/uL (ref 1.7–7.7)
Neutrophils Relative %: 79 %
Platelets: 209 10*3/uL (ref 150–400)
RBC: 4.58 MIL/uL (ref 3.87–5.11)
RDW: 18.7 % — ABNORMAL HIGH (ref 11.5–15.5)
WBC: 6 10*3/uL (ref 4.0–10.5)
nRBC: 0 % (ref 0.0–0.2)

## 2023-07-06 MED ORDER — SODIUM CHLORIDE 0.9 % IV BOLUS
1000.0000 mL | Freq: Once | INTRAVENOUS | Status: AC
  Administered 2023-07-06: 1000 mL via INTRAVENOUS

## 2023-07-06 MED ORDER — ONDANSETRON HCL 4 MG/2ML IJ SOLN: 4.0000 mg | Freq: Once | INTRAMUSCULAR | Status: DC

## 2023-07-06 NOTE — ED Provider Notes (Signed)
 [place] EMERGENCY DEPARTMENT AT [facility] Provider Note   CSN: [id number] Arrival date & time: 07/06/23  1004     History  Chief Complaint  Patient presents with   Vaginal Bleeding    Danielle Thomas is a 89 y.o. female.  89 yo F with a cc of concern for vaginal bleeding.  This was noticed mostly last night and into the morning.  Has improved significantly and she thinks maybe it stopped.  Denies abdominal pain.  Has been having nausea vomiting and diarrhea for the past few days.  She thinks that also has improved.  Initially she thought maybe she had wiped herself to forcefully.  She is on Eliquis for atrial fibrillation.  Spoke with her doctor who encouraged her to come in for evaluation.   Vaginal Bleeding      Home Medications Prior to Admission medications   Medication Sig Start Date End Date Taking? Authorizing Provider  acetaminophen (TYLENOL) 500 MG tablet Take 650 mg by mouth every 6 (six) hours as needed for mild pain (pain score 1-3).    [provider]  amoxicillin-clavulanate (AUGMENTIN) 875-125 MG tablet Take 1 tablet by mouth every 12 (twelve) hours for 5 days. 07/02/23 07/07/23  Burnadette Pop, MD  apixaban (ELIQUIS) 2.5 MG TABS tablet Take 1 tablet (2.5 mg total) by mouth 2 (two) times daily. 08/08/22   Marjie Skiff E, PA-C  dorzolamide (TRUSOPT) 2 % ophthalmic solution Place 1 drop into both eyes 2 (two) times daily.     [provider]  dorzolamide-timolol (COSOPT) 2-0.5 % ophthalmic solution Place 1 drop into both eyes 2 (two) times daily. 06/09/23   [provider]  doxycycline (VIBRA-TABS) 100 MG tablet Take 1 tablet (100 mg total) by mouth every 12 (twelve) hours for 5 days. 07/02/23 07/07/23  Burnadette Pop, MD  empagliflozin (JARDIANCE) 10 MG TABS tablet Take 1 tablet (10 mg total) by mouth daily. 08/15/22   O'NealRonnald Ramp, MD  furosemide (LASIX) 20 MG tablet Take 1 tablet (20 mg total) by mouth daily. Take tablet  (20 mg) by mouth daily. May take extra tablet by mouth if weight gain of 3 lbs overnight, 5 lbs in a week, and/or swelling of lower extremities Patient taking differently: Take 40 mg by mouth daily. 03/28/23   O'NealRonnald Ramp, MD  gabapentin (NEURONTIN) 100 MG capsule Take 100 mg by mouth at bedtime. 01/15/23   [provider]  latanoprost (XALATAN) 0.005 % ophthalmic solution Place 1 drop into both eyes at bedtime. 06/17/20   Fargo, Amy E, NP  losartan (COZAAR) 50 MG tablet TAKE 1 TABLET EVERY DAY (NEW DOSE) 01/03/23   Marjie Skiff E, PA-C  methocarbamol (ROBAXIN) 500 MG tablet 750 mg at bedtime.    [provider]  polyethylene glycol (MIRALAX / GLYCOLAX) 17 g packet Take 17 g by mouth 2 (two) times daily. Patient not taking: Reported on 06/30/2023 04/26/23   Marinda Elk, MD  potassium chloride SA (KLOR-CON M) 20 MEQ tablet TAKE 1 TABLET EVERY DAY 01/26/23   Marjie Skiff E, PA-C  rOPINIRole (REQUIP) 0.25 MG tablet Take 1 tablet (0.25 mg total) by mouth daily as needed (restless legs around 10am - home dose). Patient taking differently: Take 0.25 mg by mouth 4 (four) times daily as needed (restless leg). Takes 1 tab in the morning, 2 tabs in the afternoon, and three at night before bed 06/17/20   Fargo, Amy E, NP  traMADol (ULTRAM) 50 MG tablet  Take 1 tablet (50 mg total) by mouth every 12 (twelve) hours as needed for moderate pain (pain score 4-6) or severe pain (pain score 7-10). 07/02/23   Burnadette Pop, MD      Allergies    Amlodipine besylate, Codeine, Meperidine, Metoprolol succinate [metoprolol], and Oxycodone    Review of Systems   Review of Systems  Genitourinary:  Positive for vaginal bleeding.    Physical Exam Updated Vital Signs BP 137/89   Pulse 81   Temp 97.9 F (36.6 C) (Oral)   Resp 16   Ht 5\' 1"  (1.549 m)   Wt 54.2 kg   SpO2 96%   BMI 22.58 kg/m  Physical Exam Vitals and nursing note reviewed.  Constitutional:      General: She  is not in acute distress.    Appearance: She is well-developed. She is not diaphoretic.  HENT:     Head: Normocephalic and atraumatic.  Eyes:     Pupils: Pupils are equal, round, and reactive to light.  Cardiovascular:     Rate and Rhythm: Normal rate and regular rhythm.     Heart sounds: No murmur heard.    No friction rub. No gallop.  Pulmonary:     Effort: Pulmonary effort is normal.     Breath sounds: No wheezing or rales.  Abdominal:     General: There is no distension.     Palpations: Abdomen is soft.     Tenderness: There is no abdominal tenderness.  Genitourinary:    Comments: Some varicosities to the vagina without any obvious bleeding.  No obvious blood at the introitus.  Patient does have skin breakdown just posteriorly to the rectum, there is skin tear there.  Some blood around the area.  No obvious active bleeding.  Soft brown stool without obvious mixing with blood. Musculoskeletal:        General: No tenderness.     Cervical back: Normal range of motion and neck supple.  Skin:    General: Skin is warm and dry.  Neurological:     Mental Status: She is alert and oriented to person, place, and time.  Psychiatric:        Behavior: Behavior normal.     ED Results / Procedures / Treatments   Labs (all labs ordered are listed, but only abnormal results are displayed) Labs Reviewed  CBC WITH DIFFERENTIAL/PLATELET - Abnormal; Notable for the following components:      Result Value   RDW 18.7 (*)    All other components within normal limits  COMPREHENSIVE METABOLIC PANEL - Abnormal; Notable for the following components:   BUN 36 (*)    Albumin 3.1 (*)    GFR, Estimated 54 (*)    All other components within normal limits    EKG None  Radiology No results found.  Procedures Procedures    Medications Ordered in ED Medications  ondansetron (ZOFRAN) injection 4 mg (4 mg Intravenous Not Given 07/06/23 1140)  sodium chloride 0.9 % bolus 1,000 mL (1,000 mLs  Intravenous New Bag/Given 07/06/23 1120)    ED Course/ Medical Decision Making/ A&P                                 Medical Decision Making Risk Prescription drug management.   89 yo F with a chief complaint of concern for vaginal bleeding.  Going on since last night.  She thinks maybe has resolved.  On  my exam here and appreciate any obvious vaginal bleeding.  There is some blood scattered about her and she has a tear to the skin just posteriorly to the rectum.  I suspect that is likely the cause of her bleeding.  Hemoglobin here is stable.  She feels like bleeding is slowed or stopped.  Will give a bolus of IV fluids with history of nausea vomiting and diarrhea.  No significant electrolyte abnormality.  BUN is slightly elevated.  Will discharge home.  PCP follow-up.  12:20 PM:  I have discussed the diagnosis/risks/treatment options with the patient.  Evaluation and diagnostic testing in the emergency department does not suggest an emergent condition requiring admission or immediate intervention beyond what has been performed at this time.  They will follow up with PCP. We also discussed returning to the ED immediately if new or worsening sx occur. We discussed the sx which are most concerning (e.g., sudden worsening pain, fever, inability to tolerate by mouth) that necessitate immediate return. Medications administered to the patient during their visit and any new prescriptions provided to the patient are listed below.  Medications given during this visit Medications  ondansetron (ZOFRAN) injection 4 mg (4 mg Intravenous Not Given 07/06/23 1140)  sodium chloride 0.9 % bolus 1,000 mL (1,000 mLs Intravenous New Bag/Given 07/06/23 1120)     The patient appears reasonably screen and/or stabilized for discharge and I doubt any other medical condition or other [facility] requiring further screening, evaluation, or treatment in the ED at this time prior to discharge.          Final Clinical  Impression(s) / ED Diagnoses Final diagnoses:  Skin breakdown    Rx / DC Orders ED Discharge Orders     None         Melene Plan, DO 07/06/23 1220

## 2023-07-06 NOTE — Discharge Instructions (Signed)
 Please return for worsening bleeding or if you feel like you are in a pass out.  Please follow-up with your family doctor in the office.

## 2023-07-06 NOTE — ED Notes (Signed)
 Pt provided discharge instructions and prescription information. Pt was given the opportunity to ask questions and questions were answered.

## 2023-07-06 NOTE — ED Provider Triage Note (Signed)
 Emergency Medicine Provider Triage Evaluation Note  Danielle Thomas , a 89 y.o. female  was evaluated in triage.  Pt complains of vaginal bleeding. Is on eliquis.   Review of Systems  Positive:  Negative:   Physical Exam  BP 137/89   Pulse 81   Temp 97.9 F (36.6 C) (Oral)   Resp 16   Ht 5\' 1"  (1.549 m)   Wt 54.2 kg   SpO2 96%   BMI 22.58 kg/m  Gen:   Awake, no distress   Resp:  Normal effort  MSK:   Moves extremities without difficulty  Other:  Abd, non tender  Medical Decision Making  Medically screening exam initiated at 10:31 AM.  Appropriate orders placed.  Danielle Thomas was informed that the remainder of the evaluation will be completed by another provider, this initial triage assessment does not replace that evaluation, and the importance of remaining in the ED until their evaluation is complete.      Halford Decamp, PA-C 07/06/23 1031

## 2023-07-06 NOTE — ED Triage Notes (Signed)
 Patient is here for evaluation of vaginal bleeding that she noticed this morning. Pt reports it was initially bright red, but now is becoming darker. Concerned due to her being on blood thinners.

## 2023-07-08 LAB — CULTURE, BLOOD (ROUTINE X 2): Special Requests: ADEQUATE

## 2023-07-09 DIAGNOSIS — S92412K Displaced fracture of proximal phalanx of left great toe, subsequent encounter for fracture with nonunion: Secondary | ICD-10-CM | POA: Diagnosis not present

## 2023-07-09 DIAGNOSIS — L97912 Non-pressure chronic ulcer of unspecified part of right lower leg with fat layer exposed: Secondary | ICD-10-CM | POA: Diagnosis not present

## 2023-07-09 DIAGNOSIS — Z6820 Body mass index (BMI) 20.0-20.9, adult: Secondary | ICD-10-CM | POA: Diagnosis not present

## 2023-07-09 DIAGNOSIS — L03119 Cellulitis of unspecified part of limb: Secondary | ICD-10-CM | POA: Diagnosis not present

## 2023-07-09 DIAGNOSIS — R627 Adult failure to thrive: Secondary | ICD-10-CM | POA: Diagnosis not present

## 2023-07-09 DIAGNOSIS — R54 Age-related physical debility: Secondary | ICD-10-CM | POA: Diagnosis not present

## 2023-07-09 DIAGNOSIS — I495 Sick sinus syndrome: Secondary | ICD-10-CM | POA: Diagnosis not present

## 2023-07-09 DIAGNOSIS — I4821 Permanent atrial fibrillation: Secondary | ICD-10-CM | POA: Diagnosis not present

## 2023-07-09 DIAGNOSIS — I5022 Chronic systolic (congestive) heart failure: Secondary | ICD-10-CM | POA: Diagnosis not present

## 2023-07-10 DIAGNOSIS — S22000D Wedge compression fracture of unspecified thoracic vertebra, subsequent encounter for fracture with routine healing: Secondary | ICD-10-CM | POA: Diagnosis not present

## 2023-07-10 DIAGNOSIS — I5040 Unspecified combined systolic (congestive) and diastolic (congestive) heart failure: Secondary | ICD-10-CM | POA: Diagnosis not present

## 2023-07-10 DIAGNOSIS — D649 Anemia, unspecified: Secondary | ICD-10-CM | POA: Diagnosis not present

## 2023-07-10 DIAGNOSIS — I482 Chronic atrial fibrillation, unspecified: Secondary | ICD-10-CM | POA: Diagnosis not present

## 2023-07-10 DIAGNOSIS — G2581 Restless legs syndrome: Secondary | ICD-10-CM | POA: Diagnosis not present

## 2023-07-10 DIAGNOSIS — S72141D Displaced intertrochanteric fracture of right femur, subsequent encounter for closed fracture with routine healing: Secondary | ICD-10-CM | POA: Diagnosis not present

## 2023-07-10 DIAGNOSIS — N179 Acute kidney failure, unspecified: Secondary | ICD-10-CM | POA: Diagnosis not present

## 2023-07-10 DIAGNOSIS — F321 Major depressive disorder, single episode, moderate: Secondary | ICD-10-CM | POA: Diagnosis not present

## 2023-07-10 DIAGNOSIS — I110 Hypertensive heart disease with heart failure: Secondary | ICD-10-CM | POA: Diagnosis not present

## 2023-07-11 DIAGNOSIS — F321 Major depressive disorder, single episode, moderate: Secondary | ICD-10-CM | POA: Diagnosis not present

## 2023-07-11 DIAGNOSIS — G2581 Restless legs syndrome: Secondary | ICD-10-CM | POA: Diagnosis not present

## 2023-07-11 DIAGNOSIS — S72141D Displaced intertrochanteric fracture of right femur, subsequent encounter for closed fracture with routine healing: Secondary | ICD-10-CM | POA: Diagnosis not present

## 2023-07-11 DIAGNOSIS — N179 Acute kidney failure, unspecified: Secondary | ICD-10-CM | POA: Diagnosis not present

## 2023-07-11 DIAGNOSIS — D649 Anemia, unspecified: Secondary | ICD-10-CM | POA: Diagnosis not present

## 2023-07-11 DIAGNOSIS — S22000D Wedge compression fracture of unspecified thoracic vertebra, subsequent encounter for fracture with routine healing: Secondary | ICD-10-CM | POA: Diagnosis not present

## 2023-07-11 DIAGNOSIS — I110 Hypertensive heart disease with heart failure: Secondary | ICD-10-CM | POA: Diagnosis not present

## 2023-07-11 DIAGNOSIS — I5040 Unspecified combined systolic (congestive) and diastolic (congestive) heart failure: Secondary | ICD-10-CM | POA: Diagnosis not present

## 2023-07-11 DIAGNOSIS — I482 Chronic atrial fibrillation, unspecified: Secondary | ICD-10-CM | POA: Diagnosis not present

## 2023-07-13 DIAGNOSIS — I5042 Chronic combined systolic (congestive) and diastolic (congestive) heart failure: Secondary | ICD-10-CM | POA: Diagnosis not present

## 2023-07-13 DIAGNOSIS — I482 Chronic atrial fibrillation, unspecified: Secondary | ICD-10-CM | POA: Diagnosis not present

## 2023-07-13 DIAGNOSIS — F321 Major depressive disorder, single episode, moderate: Secondary | ICD-10-CM | POA: Diagnosis not present

## 2023-07-13 DIAGNOSIS — I110 Hypertensive heart disease with heart failure: Secondary | ICD-10-CM | POA: Diagnosis not present

## 2023-07-13 DIAGNOSIS — E785 Hyperlipidemia, unspecified: Secondary | ICD-10-CM | POA: Diagnosis not present

## 2023-07-13 DIAGNOSIS — L03116 Cellulitis of left lower limb: Secondary | ICD-10-CM | POA: Diagnosis not present

## 2023-07-13 DIAGNOSIS — I872 Venous insufficiency (chronic) (peripheral): Secondary | ICD-10-CM | POA: Diagnosis not present

## 2023-07-13 DIAGNOSIS — L97812 Non-pressure chronic ulcer of other part of right lower leg with fat layer exposed: Secondary | ICD-10-CM | POA: Diagnosis not present

## 2023-07-13 DIAGNOSIS — L97522 Non-pressure chronic ulcer of other part of left foot with fat layer exposed: Secondary | ICD-10-CM | POA: Diagnosis not present

## 2023-07-16 DIAGNOSIS — L97211 Non-pressure chronic ulcer of right calf limited to breakdown of skin: Secondary | ICD-10-CM | POA: Diagnosis not present

## 2023-07-16 DIAGNOSIS — S72141D Displaced intertrochanteric fracture of right femur, subsequent encounter for closed fracture with routine healing: Secondary | ICD-10-CM | POA: Diagnosis not present

## 2023-08-09 ENCOUNTER — Other Ambulatory Visit: Payer: Self-pay | Admitting: Student

## 2023-08-30 ENCOUNTER — Other Ambulatory Visit (HOSPITAL_COMMUNITY): Payer: Self-pay

## 2023-08-30 ENCOUNTER — Other Ambulatory Visit: Payer: Self-pay | Admitting: Cardiovascular Disease

## 2023-08-30 ENCOUNTER — Other Ambulatory Visit: Payer: Self-pay

## 2023-08-30 MED ORDER — LOSARTAN POTASSIUM 50 MG PO TABS
50.0000 mg | ORAL_TABLET | Freq: Every day | ORAL | 0 refills | Status: AC
  Filled 2023-08-30: qty 15, 15d supply, fill #0

## 2023-11-06 DEATH — deceased
